# Patient Record
Sex: Female | Born: 1945
Health system: Southern US, Community
[De-identification: ages and names within clinical notes are randomized; demographics above are authoritative.]

## PROBLEM LIST (undated history)

## (undated) DIAGNOSIS — C50919 Malignant neoplasm of unspecified site of unspecified female breast: Secondary | ICD-10-CM

## (undated) DIAGNOSIS — C801 Malignant (primary) neoplasm, unspecified: Secondary | ICD-10-CM

## (undated) DIAGNOSIS — G4730 Sleep apnea, unspecified: Secondary | ICD-10-CM

## (undated) DIAGNOSIS — Z923 Personal history of irradiation: Secondary | ICD-10-CM

## (undated) DIAGNOSIS — Z803 Family history of malignant neoplasm of breast: Secondary | ICD-10-CM

## (undated) DIAGNOSIS — I10 Essential (primary) hypertension: Secondary | ICD-10-CM

## (undated) DIAGNOSIS — E119 Type 2 diabetes mellitus without complications: Secondary | ICD-10-CM

## (undated) HISTORY — DX: Type 2 diabetes mellitus without complications: E11.9

## (undated) HISTORY — PX: ABDOMINAL HYSTERECTOMY: SHX81

## (undated) HISTORY — DX: Essential (primary) hypertension: I10

## (undated) HISTORY — PX: BREAST LUMPECTOMY: SHX2

## (undated) HISTORY — DX: Family history of malignant neoplasm of breast: Z80.3

---

## 1998-10-02 ENCOUNTER — Encounter: Payer: Self-pay | Admitting: Family Medicine

## 1998-10-02 ENCOUNTER — Ambulatory Visit (HOSPITAL_COMMUNITY): Admission: RE | Admit: 1998-10-02 | Discharge: 1998-10-02 | Payer: Self-pay | Admitting: Family Medicine

## 1999-10-05 ENCOUNTER — Encounter: Payer: Self-pay | Admitting: Family Medicine

## 1999-10-05 ENCOUNTER — Ambulatory Visit (HOSPITAL_COMMUNITY): Admission: RE | Admit: 1999-10-05 | Discharge: 1999-10-05 | Payer: Self-pay | Admitting: Family Medicine

## 2000-10-25 ENCOUNTER — Encounter: Payer: Self-pay | Admitting: Family Medicine

## 2000-10-25 ENCOUNTER — Ambulatory Visit (HOSPITAL_COMMUNITY): Admission: RE | Admit: 2000-10-25 | Discharge: 2000-10-25 | Payer: Self-pay | Admitting: *Deleted

## 2001-10-09 ENCOUNTER — Encounter: Payer: Self-pay | Admitting: Family Medicine

## 2001-10-09 ENCOUNTER — Encounter: Admission: RE | Admit: 2001-10-09 | Discharge: 2001-10-09 | Payer: Self-pay | Admitting: Family Medicine

## 2001-10-29 ENCOUNTER — Encounter: Payer: Self-pay | Admitting: Family Medicine

## 2001-10-29 ENCOUNTER — Ambulatory Visit (HOSPITAL_COMMUNITY): Admission: RE | Admit: 2001-10-29 | Discharge: 2001-10-29 | Payer: Self-pay | Admitting: Family Medicine

## 2003-10-15 ENCOUNTER — Ambulatory Visit (HOSPITAL_COMMUNITY): Admission: RE | Admit: 2003-10-15 | Discharge: 2003-10-15 | Payer: Self-pay | Admitting: Family Medicine

## 2004-04-22 ENCOUNTER — Encounter: Admission: RE | Admit: 2004-04-22 | Discharge: 2004-04-22 | Payer: Self-pay | Admitting: Family Medicine

## 2004-11-08 ENCOUNTER — Ambulatory Visit (HOSPITAL_COMMUNITY): Admission: RE | Admit: 2004-11-08 | Discharge: 2004-11-08 | Payer: Self-pay | Admitting: *Deleted

## 2005-04-12 ENCOUNTER — Ambulatory Visit (HOSPITAL_BASED_OUTPATIENT_CLINIC_OR_DEPARTMENT_OTHER): Admission: RE | Admit: 2005-04-12 | Discharge: 2005-04-12 | Payer: Self-pay | Admitting: *Deleted

## 2005-04-17 ENCOUNTER — Ambulatory Visit: Payer: Self-pay | Admitting: Internal Medicine

## 2005-05-11 ENCOUNTER — Ambulatory Visit (HOSPITAL_COMMUNITY): Admission: RE | Admit: 2005-05-11 | Discharge: 2005-05-11 | Payer: Self-pay | Admitting: *Deleted

## 2005-12-16 ENCOUNTER — Ambulatory Visit (HOSPITAL_COMMUNITY): Admission: RE | Admit: 2005-12-16 | Discharge: 2005-12-16 | Payer: Self-pay | Admitting: *Deleted

## 2006-01-09 ENCOUNTER — Encounter: Admission: RE | Admit: 2006-01-09 | Discharge: 2006-01-09 | Payer: Self-pay | Admitting: *Deleted

## 2007-01-25 ENCOUNTER — Ambulatory Visit (HOSPITAL_COMMUNITY): Admission: RE | Admit: 2007-01-25 | Discharge: 2007-01-25 | Payer: Self-pay | Admitting: Internal Medicine

## 2008-03-17 ENCOUNTER — Ambulatory Visit (HOSPITAL_COMMUNITY): Admission: RE | Admit: 2008-03-17 | Discharge: 2008-03-17 | Payer: Self-pay | Admitting: Internal Medicine

## 2009-03-18 ENCOUNTER — Ambulatory Visit (HOSPITAL_COMMUNITY): Admission: RE | Admit: 2009-03-18 | Discharge: 2009-03-18 | Payer: Self-pay | Admitting: Internal Medicine

## 2010-03-19 ENCOUNTER — Ambulatory Visit (HOSPITAL_COMMUNITY): Admission: RE | Admit: 2010-03-19 | Discharge: 2010-03-19 | Payer: Self-pay | Admitting: Internal Medicine

## 2010-12-26 ENCOUNTER — Encounter: Payer: Self-pay | Admitting: Internal Medicine

## 2011-03-15 ENCOUNTER — Other Ambulatory Visit: Payer: Self-pay | Admitting: Family Medicine

## 2011-03-15 DIAGNOSIS — Z1231 Encounter for screening mammogram for malignant neoplasm of breast: Secondary | ICD-10-CM

## 2011-03-15 DIAGNOSIS — Z0000 Encounter for general adult medical examination without abnormal findings: Secondary | ICD-10-CM

## 2011-03-21 ENCOUNTER — Ambulatory Visit (HOSPITAL_COMMUNITY)
Admission: RE | Admit: 2011-03-21 | Discharge: 2011-03-21 | Disposition: A | Discharge status: Home / Self Care | Payer: BLUE CROSS/BLUE SHIELD | Source: Ambulatory Visit | Attending: Family Medicine | Admitting: Family Medicine

## 2011-03-21 DIAGNOSIS — Z1231 Encounter for screening mammogram for malignant neoplasm of breast: Secondary | ICD-10-CM | POA: Insufficient documentation

## 2011-03-22 ENCOUNTER — Other Ambulatory Visit: Payer: Self-pay | Admitting: Family Medicine

## 2011-03-22 DIAGNOSIS — R928 Other abnormal and inconclusive findings on diagnostic imaging of breast: Secondary | ICD-10-CM

## 2011-03-25 ENCOUNTER — Ambulatory Visit
Admission: RE | Admit: 2011-03-25 | Discharge: 2011-03-25 | Disposition: A | Discharge status: Home / Self Care | Payer: BLUE CROSS/BLUE SHIELD | Source: Ambulatory Visit | Attending: Family Medicine | Admitting: Family Medicine

## 2011-03-25 DIAGNOSIS — R928 Other abnormal and inconclusive findings on diagnostic imaging of breast: Secondary | ICD-10-CM

## 2011-04-05 ENCOUNTER — Other Ambulatory Visit: Payer: Self-pay | Admitting: Family Medicine

## 2011-04-05 DIAGNOSIS — Z803 Family history of malignant neoplasm of breast: Secondary | ICD-10-CM

## 2011-04-22 NOTE — Procedures (Signed)
[name], Susan Miles                 ACCOUNT NO.:  [id number]   MEDICAL RECORD NO.:  [id number]          PATIENT TYPE:  OUT   LOCATION:  SLEEP CENTER                 FACILITY:  [facility]   PHYSICIAN:  Clinton D. Maple Hudson, M.D. DATE OF BIRTH:  19-Oct-1946   DATE OF STUDY:  04/12/2005                              NOCTURNAL POLYSOMNOGRAM   REFERRING PHYSICIAN:  Dr. Coralee Rud   STUDY DATE:  Apr 12, 2005   INDICATION FOR STUDY:  Hypersomnia with sleep apnea.  Epworth Sleepiness  Score 9/24, BMI 41, weight 242 pounds.   SLEEP ARCHITECTURE:  Total sleep time 415 minutes with sleep efficiency 93%.  Stage I 4%, stage II 66%, stages III and IV 2% and REM was 29% of total  sleep time.  Sleep latency 9 minutes, REM latency 81 minutes, awake after  sleep onset 22 minutes, arousal index 20.  No sleep medication was taken.   RESPIRATORY DATA:  Respiratory disturbance index (RDI, AHI) 26.6 obstructive  events per hour indicating moderate obstructive sleep apnea/hypopnea  syndrome.  There were 3 obstructive apneas and 181 hypopneas.  Events were  not positional.  REM RDI 69.  The technician recorded that she was  borderline and qualifying for progression to CPAP titration by split  protocol on this study night.  Because events appeared to resolve with  position change initially he did not attempt CPAP titration.   OXYGEN DATA:  Moderately loud snoring with oxygen desaturation to a nadir of  84%.  Mean oxygen saturation through the study was 94% on room air.   CARDIAC DATA:  Normal sinus rhythm.   MOVEMENT/PARASOMNIA:  Occasional leg jerks with insignificant effect on  sleep.   IMPRESSION/RECOMMENDATION:  1.  Moderate obstructive sleep apnea/hypopnea syndrome, respiratory      disturbance index 26.6 per hour with moderate snoring and oxygen      desaturation to 84% on room air.  2.  Most events were hypopneas, not positional, but clearly most pronounced      in rapid eye movement (rapid eye movement  respiratory disturbance index      69).  Rapid eye movement suppression therapy with a tricyclic      antidepressant has been used occasionally in this situation although      standard therapies including continuous positive airway pressure were      usually considered preferable.  Consider return for continuous positive      airway pressure titration or evaluate for alternative therapies as      appropriate.  3.  This study was done as a nighttime study and her sleep pattern was      unremarkable without sleep medication.  She indicates her usual work      schedule is night shift and that she sleeps off and on during the day.      Her sleep      schedule and habits may contribute to her sleep complaint.  If more      appropriate, she can return for continuous positive airway pressure      titration as a daytime study if requested.      CDY/MEDQ  D:  04/17/2005 09:07:15  T:  04/17/2005 10:04:05  Job:  [id number]

## 2011-05-05 ENCOUNTER — Other Ambulatory Visit: Payer: BLUE CROSS/BLUE SHIELD

## 2011-05-24 ENCOUNTER — Ambulatory Visit
Admission: RE | Admit: 2011-05-24 | Discharge: 2011-05-24 | Disposition: A | Discharge status: Home / Self Care | Payer: BLUE CROSS/BLUE SHIELD | Source: Ambulatory Visit | Attending: Family Medicine | Admitting: Family Medicine

## 2011-05-24 DIAGNOSIS — Z803 Family history of malignant neoplasm of breast: Secondary | ICD-10-CM

## 2011-05-24 MED ORDER — GADOBENATE DIMEGLUMINE 529 MG/ML IV SOLN
19.0000 mL | Freq: Once | INTRAVENOUS | Status: AC | PRN
  Administered 2011-05-24: 19 mL via INTRAVENOUS

## 2012-01-24 ENCOUNTER — Ambulatory Visit: Payer: Medicare Other

## 2012-01-24 ENCOUNTER — Ambulatory Visit (INDEPENDENT_AMBULATORY_CARE_PROVIDER_SITE_OTHER): Payer: Medicare Other | Admitting: Family Medicine

## 2012-01-24 DIAGNOSIS — M545 Low back pain, unspecified: Secondary | ICD-10-CM

## 2012-01-24 DIAGNOSIS — M25559 Pain in unspecified hip: Secondary | ICD-10-CM

## 2012-01-24 DIAGNOSIS — M5430 Sciatica, unspecified side: Secondary | ICD-10-CM

## 2012-01-24 DIAGNOSIS — M79606 Pain in leg, unspecified: Secondary | ICD-10-CM

## 2012-01-24 MED ORDER — CYCLOBENZAPRINE HCL 5 MG PO TABS: 5.0000 mg | ORAL_TABLET | Freq: Three times a day (TID) | ORAL | Status: AC | PRN

## 2012-01-24 MED ORDER — MELOXICAM 7.5 MG PO TABS: 7.5000 mg | ORAL_TABLET | Freq: Every day | ORAL | Status: DC

## 2012-01-24 NOTE — Patient Instructions (Signed)
See the back care manual for instructions.  Muscle relaxer (cyclobenzaprine) at night if needed, meloxicam during day for inflammation.  Can take tylenol over the counter as well.  Work on heat or ice to area, range of motion, gentle stretches.  Recheck in next 5-7 days if not improving.  Return to the clinic or go to the nearest emergency room if any of your symptoms worsen or new symptoms occur.   Sciatica Sciatica is a weakness and/or changes in sensation (tingling, jolts, hot and cold, numbness) along the path the sciatic nerve travels. Irritation or damage to lumbar nerve roots is often also referred to as lumbar radiculopathy.  Lumbar radiculopathy (Sciatica) is the most common form of this problem. Radiculopathy can occur in any of the nerves coming out of the spinal cord. The problems caused depend on which nerves are involved. The sciatic nerve is the large nerve supplying the branches of nerves going from the hip to the toes. It often causes a numbness or weakness in the skin and/or muscles that the sciatic nerve serves. It also may cause symptoms (problems) of pain, burning, tingling, or electric shock-like feelings in the path of this nerve. This usually comes from injury to the fibers that make up the sciatic nerve. Some of these symptoms are low back pain and/or unpleasant feelings in the following areas:  From the mid-buttock down the back of the leg to the back of the knee.   And/or the outside of the calf and top of the foot.   And/or behind the inner ankle to the sole of the foot.  CAUSES   Herniated or slipped disc. Discs are the little cushions between the bones in the back.   Pressure by the piriformis muscle in the buttock on the sciatic nerve (Piriformis Syndrome).   Misalignment of the bones in the lower back and buttocks (Sacroiliac Joint Derangement).   Narrowing of the spinal canal that puts pressure on or pinches the fibers that make up the sciatic nerve.   A  slipped vertebra that is out of line with those above or beneath it.   Abnormality of the nervous system itself so that nerve fibers do not transmit signals properly, especially to feet and calves (neuropathy).   Tumor (this is rare).  Your caregiver can usually determine the cause of your sciatica and begin the treatment most likely to help you. TREATMENT  Taking over-the-counter painkillers, physical therapy, rest, exercise, spinal manipulation, and injections of anesthetics and/or steroids may be used. Surgery, acupuncture, and Yoga can also be effective. Mind over matter techniques, mental imagery, and changing factors such as your bed, chair, desk height, posture, and activities are other treatments that may be helpful. You and your caregiver can help determine what is best for you. With proper diagnosis, the cause of most sciatica can be identified and removed. Communication and cooperation between your caregiver and you is essential. If you are not successful immediately, do not be discouraged. With time, a proper treatment can be found that will make you comfortable. HOME CARE INSTRUCTIONS   If the pain is coming from a problem in the back, applying ice to that area for 15 to 20 minutes, 3 to 4 times per day while awake, may be helpful. Put the ice in a plastic bag. Place a towel between the bag of ice and your skin.   You may exercise or perform your usual activities if these do not aggravate your pain, or as suggested by your  caregiver.   Only take over-the-counter or prescription medicines for pain, discomfort, or fever as directed by your caregiver.   If your caregiver has given you a follow-up appointment, it is very important to keep that appointment. Not keeping the appointment could result in a chronic or permanent injury, pain, and disability. If there is any problem keeping the appointment, you must call back to this facility for assistance.  SEEK IMMEDIATE MEDICAL CARE IF:   You  experience loss of control of bowel or bladder.   You have increasing weakness in the trunk, buttocks, or legs.   There is numbness in any areas from the hip down to the toes.   You have difficulty walking or keeping your balance.   You have any of the above, with fever or forceful vomiting.  Document Released: 11/15/2001 Document Revised: 08/03/2011 Document Reviewed: 07/04/2008 [facility] Patient Information 2012 [place], [place].

## 2012-01-24 NOTE — Progress Notes (Signed)
Subjective:    Patient ID: Susan Miles, female    DOB: 1946-06-03, 66 y.o.   MRN: [id number]  HPI Susan Miles is a 66 y.o. female, with hx DM type 2 (A1c 6.9 07/12/11), HTN, and hyperlipidemia with c/o   Started with pain in R leg behind R thigh and into R buttock, low aspect of R back/hip.  Does not radiate beyond knee.  NKI, just woke up with pain 2 days ago.  Feels like swelling in area. Similar symptoms in past that improved with tylenol and rest.  tylenol only as treatment this time - feels worse than in past. No bowel/bladder sx's.   Review of Systems  Constitutional: Positive for activity change. Negative for fever and chills.       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  Gastrointestinal: Negative for abdominal pain.       No bowel incontinence  Genitourinary: Negative for urgency, frequency, hematuria and difficulty urinating.       No bladder incontinence.  Musculoskeletal: Positive for myalgias, back pain, arthralgias and gait problem. Negative for joint swelling.       Sore in bottom of low back and to outside and back of hip.  Skin: Negative for color change and rash.  Neurological: Negative for weakness.       Objective:   Physical Exam  Constitutional: She is oriented to person, place, and time. She appears well-developed and well-nourished.  HENT:  Head: Normocephalic and atraumatic.  Cardiovascular: Normal rate and intact distal pulses.   Pulmonary/Chest: Effort normal and breath sounds normal.  Abdominal: Soft. There is no tenderness.  Musculoskeletal:       Right hip: Normal. She exhibits normal range of motion and no tenderness.       Lumbar back: She exhibits spasm. She exhibits normal range of motion, no tenderness, no bony tenderness and no swelling.       Back:       Right upper leg: She exhibits tenderness. She exhibits no swelling and no edema.       Legs:      No pain with internal  rotation of R hip.  Neurological: She is alert and oriented to person, place, and time. No sensory deficit. She displays no Babinski's sign on the right side. She displays no Babinski's sign on the left side.  Reflex Scores:      Patellar reflexes are 2+ on the right side and 2+ on the left side.      Achilles reflexes are 2+ on the right side and 2+ on the left side. Skin: Skin is warm and dry. No rash noted.  Psychiatric: She has a normal mood and affect. Her behavior is normal.   [facility] reading (PRIMARY) by  Dr. Neva Seat: R hip: no acute findings.  LS spine:  DJD lower lumbar - L4-5, notable in posterior elements.  Slight spondylosis      Assessment & Plan:   1. LBP (low back pain)  DG Lumbar Spine Complete  2. Leg pain, posterior  DG Hip Complete Right   Probable lumbosacral DJD/DDD with sciatica symptoms, with onset from increased work/standing at work.  "swelling" is likely spasm.  Good strength and NVI distally.  No pain with IR of hip - doubt hip source.  Trial of Flexeril 5mg  qhs prn -side effects discussed, mobic 7.5mg  qd prn (short course based on hx , and watch blood pressures), heat, and  gentle ROM.  Back care manual. oow for next 3 days - rtw  Sunday 02/26/12. If not improving in next 5-7 days, RTC.  Return to the clinic or go to the nearest emergency room if symptoms worsen or new symptoms occur.

## 2012-02-10 ENCOUNTER — Ambulatory Visit (INDEPENDENT_AMBULATORY_CARE_PROVIDER_SITE_OTHER): Payer: Medicare Other | Admitting: Family Medicine

## 2012-02-10 ENCOUNTER — Encounter: Payer: Self-pay | Admitting: Family Medicine

## 2012-02-10 DIAGNOSIS — E119 Type 2 diabetes mellitus without complications: Secondary | ICD-10-CM | POA: Insufficient documentation

## 2012-02-10 DIAGNOSIS — I10 Essential (primary) hypertension: Secondary | ICD-10-CM | POA: Insufficient documentation

## 2012-02-10 DIAGNOSIS — IMO0001 Reserved for inherently not codable concepts without codable children: Secondary | ICD-10-CM

## 2012-02-10 DIAGNOSIS — E1129 Type 2 diabetes mellitus with other diabetic kidney complication: Secondary | ICD-10-CM | POA: Insufficient documentation

## 2012-02-10 LAB — COMPREHENSIVE METABOLIC PANEL
Albumin: 4.2 g/dL (ref 3.5–5.2)
BUN: 11 mg/dL (ref 6–23)
Glucose, Bld: 116 mg/dL — ABNORMAL HIGH (ref 70–99)
Potassium: 4.5 mEq/L (ref 3.5–5.3)
Sodium: 137 mEq/L (ref 135–145)
Total Bilirubin: 0.6 mg/dL (ref 0.3–1.2)

## 2012-02-10 LAB — GLUCOSE, POCT (MANUAL RESULT ENTRY): POC Glucose: 139

## 2012-02-10 MED ORDER — SAXAGLIPTIN HCL 5 MG PO TABS: 5.0000 mg | ORAL_TABLET | Freq: Every day | ORAL | Status: DC

## 2012-02-10 MED ORDER — CETIRIZINE HCL 10 MG PO TABS: 10.0000 mg | ORAL_TABLET | Freq: Every day | ORAL | Status: DC

## 2012-02-10 MED ORDER — METOPROLOL SUCCINATE ER 200 MG PO TB24: 200.0000 mg | ORAL_TABLET | Freq: Every day | ORAL | Status: DC

## 2012-02-10 MED ORDER — VALSARTAN-HYDROCHLOROTHIAZIDE 320-25 MG PO TABS: 1.0000 | ORAL_TABLET | Freq: Every day | ORAL | Status: DC

## 2012-02-10 MED ORDER — METFORMIN HCL 1000 MG PO TABS: 1000.0000 mg | ORAL_TABLET | Freq: Two times a day (BID) | ORAL | Status: DC

## 2012-02-10 MED ORDER — CLONIDINE HCL 0.2 MG PO TABS: 0.2000 mg | ORAL_TABLET | Freq: Two times a day (BID) | ORAL | Status: DC

## 2012-02-10 MED ORDER — PRAVASTATIN SODIUM 40 MG PO TABS: 40.0000 mg | ORAL_TABLET | Freq: Every day | ORAL | Status: DC

## 2012-02-10 NOTE — Progress Notes (Signed)
  Subjective:    Patient ID: Susan Miles, female    DOB: 1946/03/17, 66 y.o.   MRN: [id number]  HPI Susan Miles is a 66 y.o. female  Here for DM follow up.  DM2 - home cbg's 120 -140/ 150.  No symptomatic low.  No new side effects. Wt 207-210 since august.  Last HGB A1c 6.14 July 2011 Htn:  No new side effects with meds.  140/89 at home at times.  Review of Systems  Constitutional: Negative for fever, chills, activity change and fatigue.  Respiratory: Negative for shortness of breath and wheezing.   Cardiovascular: Negative for chest pain, palpitations and leg swelling.  Gastrointestinal: Negative for abdominal pain.  Neurological: Negative for dizziness, syncope, weakness, light-headedness, numbness and headaches.       Objective:   Physical Exam  Constitutional: She is oriented to person, place, and time. She appears well-developed and well-nourished.  HENT:  Head: Normocephalic and atraumatic.  Mouth/Throat: Oropharynx is clear and moist.  Eyes: Conjunctivae and EOM are normal. Pupils are equal, round, and reactive to light.  Neck: Normal range of motion. No thyromegaly present.  Cardiovascular: Normal rate, regular rhythm, normal heart sounds and intact distal pulses.   No murmur heard. Pulmonary/Chest: Effort normal. She has no wheezes. She has no rales.  Abdominal: Soft. Normal appearance. She exhibits no abdominal bruit and no pulsatile midline mass. There is no tenderness.  Neurological: She is alert and oriented to person, place, and time.       Microfilament testing WNL bilat feet  Skin: Skin is warm and dry.  Psychiatric: She has a normal mood and affect. Her behavior is normal.   Fasting today. Results for orders placed in visit on 02/10/12  POCT GLYCOSYLATED HEMOGLOBIN (HGB A1C)      Component Value Range   Hemoglobin A1C 7.9    GLUCOSE, POCT (MANUAL RESULT ENTRY)      Component Value Range   POC Glucose 139         Assessment & Plan:  Susan Miles  is a 66 y.o. female 1. HTN (hypertension)  Comprehensive metabolic panel  2. DM2 (diabetes mellitus, type 2)  POCT glycosylated hemoglobin (Hb A1C), POCT glucose (manual entry)   Uncontrolled diabetes.  stressed importance of diet and exercise to achieve wt loss, as weight up today.  Continue metformin 1 gram BID, increase onglyza to 5mg  qd.  Check home cbg's,  Hypoglycemic precautions reviewed.  BP borderline, but no new meds for now  Recheck in 3 months.

## 2012-02-10 NOTE — Patient Instructions (Signed)
Work on diet and weight loss as diabetes less controlled.  Increasing onglyza to 5mg  1 per day, other meds same.  Recheck 3 months.

## 2012-02-19 ENCOUNTER — Other Ambulatory Visit: Payer: Self-pay | Admitting: Family Medicine

## 2012-03-12 ENCOUNTER — Other Ambulatory Visit: Payer: Self-pay | Admitting: Family Medicine

## 2012-03-12 DIAGNOSIS — Z1231 Encounter for screening mammogram for malignant neoplasm of breast: Secondary | ICD-10-CM

## 2012-04-05 ENCOUNTER — Ambulatory Visit (HOSPITAL_COMMUNITY)
Admission: RE | Admit: 2012-04-05 | Discharge: 2012-04-05 | Disposition: A | Discharge status: Home / Self Care | Payer: Medicare Other | Source: Ambulatory Visit | Attending: Family Medicine | Admitting: Family Medicine

## 2012-04-05 DIAGNOSIS — Z1231 Encounter for screening mammogram for malignant neoplasm of breast: Secondary | ICD-10-CM | POA: Insufficient documentation

## 2012-05-18 ENCOUNTER — Ambulatory Visit (INDEPENDENT_AMBULATORY_CARE_PROVIDER_SITE_OTHER): Payer: Medicare Other | Admitting: Family Medicine

## 2012-05-18 ENCOUNTER — Encounter: Payer: Self-pay | Admitting: Family Medicine

## 2012-05-18 VITALS — BP 127/83 | HR 76 | Temp 97.8°F | Resp 16 | Ht 63.0 in | Wt 206.0 lb

## 2012-05-18 DIAGNOSIS — I10 Essential (primary) hypertension: Secondary | ICD-10-CM

## 2012-05-18 DIAGNOSIS — IMO0001 Reserved for inherently not codable concepts without codable children: Secondary | ICD-10-CM

## 2012-05-18 DIAGNOSIS — E785 Hyperlipidemia, unspecified: Secondary | ICD-10-CM

## 2012-05-18 LAB — POCT GLYCOSYLATED HEMOGLOBIN (HGB A1C): Hemoglobin A1C: 7.5

## 2012-05-18 MED ORDER — CETIRIZINE HCL 10 MG PO TABS: 10.0000 mg | ORAL_TABLET | Freq: Every day | ORAL | Status: DC

## 2012-05-18 MED ORDER — PRAVASTATIN SODIUM 40 MG PO TABS: 40.0000 mg | ORAL_TABLET | Freq: Every day | ORAL | Status: DC

## 2012-05-18 MED ORDER — CLONIDINE HCL 0.2 MG PO TABS: 0.2000 mg | ORAL_TABLET | Freq: Two times a day (BID) | ORAL | Status: DC

## 2012-05-18 MED ORDER — GLIPIZIDE 10 MG PO TABS: 10.0000 mg | ORAL_TABLET | Freq: Two times a day (BID) | ORAL | Status: DC

## 2012-05-18 MED ORDER — SAXAGLIPTIN HCL 5 MG PO TABS: 5.0000 mg | ORAL_TABLET | Freq: Every day | ORAL | Status: DC

## 2012-05-18 MED ORDER — MELOXICAM 7.5 MG PO TABS: 7.5000 mg | ORAL_TABLET | Freq: Every day | ORAL | Status: DC

## 2012-05-18 MED ORDER — METOPROLOL SUCCINATE ER 200 MG PO TB24: 200.0000 mg | ORAL_TABLET | Freq: Every day | ORAL | Status: DC

## 2012-05-18 MED ORDER — VALSARTAN-HYDROCHLOROTHIAZIDE 320-25 MG PO TABS: 1.0000 | ORAL_TABLET | Freq: Every day | ORAL | Status: DC

## 2012-05-18 MED ORDER — METFORMIN HCL 1000 MG PO TABS: 1000.0000 mg | ORAL_TABLET | Freq: Two times a day (BID) | ORAL | Status: DC

## 2012-05-18 NOTE — Progress Notes (Signed)
  Subjective:    Patient ID: Susan Miles, female    DOB: 08-May-1946, 67 y.o.   MRN: [id number]  HPI Susan Miles is a 66 y.o. female  HTN - highest 161/90.  Usually 120-140/80's.   DM2 - Aic 7.9 in March.  stressed importance of diet and exercise to achieve wt loss. Continued metformin 1 gram BID, increased onglyza to 5mg  qd. Weight up approximately 1 kg. Just retired. Plans on increasing exercise. Home blood sugars - 135-140.  Occasionally 160.  Had some junk food with retirement party. Now cooking at home with fresh vegetables. No lows. Dentist visit next month.  Will be scheduling eye appt.   Hyperlipidemia - no new myalgias, or side effects of meds.  Last LDL 103 in 8/12. Fasting today.   Review of Systems  Constitutional: Negative for fatigue and unexpected weight change.  Respiratory: Negative for chest tightness and shortness of breath.   Cardiovascular: Negative for chest pain, palpitations and leg swelling.  Gastrointestinal: Negative for abdominal pain and blood in stool.  Neurological: Negative for dizziness, syncope, light-headedness and headaches.       Objective:   Physical Exam  Constitutional: She is oriented to person, place, and time. She appears well-developed and well-nourished.  HENT:  Head: Normocephalic and atraumatic.  Mouth/Throat: Oropharynx is clear and moist.  Eyes: Conjunctivae and EOM are normal. Pupils are equal, round, and reactive to light.  Neck: Normal range of motion. No thyromegaly present.  Cardiovascular: Normal rate, regular rhythm, normal heart sounds and intact distal pulses.   No murmur heard. Pulmonary/Chest: Effort normal. She has no wheezes. She has no rales.  Abdominal: Soft. Normal appearance. She exhibits no abdominal bruit and no pulsatile midline mass. There is no tenderness.  Neurological: She is alert and oriented to person, place, and time.       Microfilament testing WNL bilat feet  Skin: Skin is warm and dry.  Psychiatric:  She has a normal mood and affect. Her behavior is normal.   Results for orders placed in visit on 05/18/12  GLUCOSE, POCT (MANUAL RESULT ENTRY)      Component Value Range   POC Glucose 115 (*) 70 - 99 mg/dl  POCT GLYCOSYLATED HEMOGLOBIN (HGB A1C)      Component Value Range   Hemoglobin A1C 7.5         Assessment & Plan:  .[name] is a 66 y.o. female 1. Essential hypertension, benign  Comprehensive metabolic panel  2. Type II or unspecified type diabetes mellitus without mention of complication, uncontrolled  POCT glucose (manual entry), POCT glycosylated hemoglobin (Hb A1C)  3. Other and unspecified hyperlipidemia  Lipid panel   DM2 - uncontrolled but improving.  Continue to work on diet and exercise, same doses for now.  Recheck in 3 months.   HTN - overall controlled.  Continue same regimen.    Episodic arthralgia - has mobic prn - risks discussed.    Lipids - discussed weight loss, and counseled on diet changes, fiber, and increase in water. Check CMP, lipids.    Recheck in 3 months.   Refills given x 6 months.

## 2012-05-18 NOTE — Patient Instructions (Addendum)
Continue to work on diet and walking for exercise, goal A1c less than 7. Recheck in 3 months, sooner if any new symptoms.  Your should receive a call or letter about your lab results within the next week to 10 days.  Return to the clinic or go to the nearest emergency room if any of your symptoms worsen or new symptoms occur.

## 2012-05-19 LAB — LIPID PANEL
HDL: 38 mg/dL — ABNORMAL LOW (ref >39)
LDL Cholesterol: 96 mg/dL (ref 0–99)
Total CHOL/HDL Ratio: 4 Ratio
VLDL: 17 mg/dL (ref 0–40)

## 2012-05-19 LAB — COMPREHENSIVE METABOLIC PANEL
ALT: 17 U/L (ref 0–35)
AST: 17 U/L (ref 0–37)
Alkaline Phosphatase: 45 U/L (ref 39–117)
BUN: 15 mg/dL (ref 6–23)
Creat: 0.85 mg/dL (ref 0.50–1.10)
Total Bilirubin: 0.6 mg/dL (ref 0.3–1.2)

## 2012-05-24 DIAGNOSIS — IMO0002 Reserved for concepts with insufficient information to code with codable children: Secondary | ICD-10-CM | POA: Insufficient documentation

## 2012-09-17 ENCOUNTER — Ambulatory Visit (INDEPENDENT_AMBULATORY_CARE_PROVIDER_SITE_OTHER): Payer: Medicare Other | Admitting: Family Medicine

## 2012-09-17 ENCOUNTER — Encounter: Payer: Self-pay | Admitting: Family Medicine

## 2012-09-17 VITALS — BP 168/92 | HR 77 | Temp 97.9°F | Resp 16 | Ht 63.0 in | Wt 211.8 lb

## 2012-09-17 DIAGNOSIS — E785 Hyperlipidemia, unspecified: Secondary | ICD-10-CM

## 2012-09-17 DIAGNOSIS — M549 Dorsalgia, unspecified: Secondary | ICD-10-CM

## 2012-09-17 DIAGNOSIS — L309 Dermatitis, unspecified: Secondary | ICD-10-CM

## 2012-09-17 DIAGNOSIS — I10 Essential (primary) hypertension: Secondary | ICD-10-CM

## 2012-09-17 DIAGNOSIS — L259 Unspecified contact dermatitis, unspecified cause: Secondary | ICD-10-CM

## 2012-09-17 DIAGNOSIS — J309 Allergic rhinitis, unspecified: Secondary | ICD-10-CM

## 2012-09-17 DIAGNOSIS — E119 Type 2 diabetes mellitus without complications: Secondary | ICD-10-CM

## 2012-09-17 DIAGNOSIS — Z23 Encounter for immunization: Secondary | ICD-10-CM

## 2012-09-17 DIAGNOSIS — IMO0001 Reserved for inherently not codable concepts without codable children: Secondary | ICD-10-CM

## 2012-09-17 LAB — POCT GLYCOSYLATED HEMOGLOBIN (HGB A1C): Hemoglobin A1C: 7.2

## 2012-09-17 LAB — GLUCOSE, POCT (MANUAL RESULT ENTRY): POC Glucose: 115 mg/dl — AB (ref 70–99)

## 2012-09-17 MED ORDER — SAXAGLIPTIN HCL 5 MG PO TABS: 5.0000 mg | ORAL_TABLET | Freq: Every day | ORAL | Status: DC

## 2012-09-17 MED ORDER — MELOXICAM 7.5 MG PO TABS: 7.5000 mg | ORAL_TABLET | Freq: Every day | ORAL | Status: AC

## 2012-09-17 MED ORDER — GLIPIZIDE 10 MG PO TABS: 10.0000 mg | ORAL_TABLET | Freq: Two times a day (BID) | ORAL | Status: DC

## 2012-09-17 MED ORDER — CLONIDINE HCL 0.1 MG PO TABS: 0.1000 mg | ORAL_TABLET | Freq: Every morning | ORAL | Status: DC

## 2012-09-17 MED ORDER — CETIRIZINE HCL 10 MG PO TABS: 10.0000 mg | ORAL_TABLET | Freq: Every day | ORAL | Status: DC

## 2012-09-17 MED ORDER — METOPROLOL SUCCINATE ER 200 MG PO TB24: 200.0000 mg | ORAL_TABLET | Freq: Every day | ORAL | Status: DC

## 2012-09-17 MED ORDER — CLONIDINE HCL 0.2 MG PO TABS: 0.2000 mg | ORAL_TABLET | Freq: Two times a day (BID) | ORAL | Status: DC

## 2012-09-17 MED ORDER — VALSARTAN-HYDROCHLOROTHIAZIDE 320-25 MG PO TABS: 1.0000 | ORAL_TABLET | Freq: Every day | ORAL | Status: DC

## 2012-09-17 MED ORDER — TRIAMCINOLONE ACETONIDE 0.1 % EX CREA: TOPICAL_CREAM | Freq: Two times a day (BID) | CUTANEOUS | Status: DC

## 2012-09-17 MED ORDER — METFORMIN HCL 1000 MG PO TABS: 1000.0000 mg | ORAL_TABLET | Freq: Two times a day (BID) | ORAL | Status: DC

## 2012-09-17 MED ORDER — PRAVASTATIN SODIUM 40 MG PO TABS: 40.0000 mg | ORAL_TABLET | Freq: Every day | ORAL | Status: DC

## 2012-09-17 NOTE — Progress Notes (Signed)
Subjective:    Patient ID: Susan Miles, female    DOB: [birth year], 66 y.o.   MRN: [id number]  HPI Susan Miles is a 66 y.o. female HTN - no recent outside BP's. Machine broke. No chest pain or other new symptoms.  No weakness, no slurred speech. No missed doses of catapres, diovan hct, or toprol. Prior controlled. Only rare meloxicam for arthritis - last taken for arthritis or low back pain.   DM2 - Aic 7.5 (down from 7.9) at OV in June.  Continued metformin 1 gram BID, glipizide 10mg  BID, onglyza 5mg  qd with emphasis on diet and weight loss.  Not exercising recently. No recent change in diet.  Occasional fried food.  Usually bakes.  Plans on joining the [facility].   Home blood sugars: 148, 130, 120, 150. Dentist visit in July, optho eval in August.  No concerns.   Hyperlipidemia -labs as below.  Continued pravachol 40mg  qd. No new side effects or new or worsening myalgias.   Foot itching - under foot , on top and heel.  No tingling.  Comes and goes. Past month.  Tx: benadryl cream.   Results for orders placed in visit on 05/18/12  GLUCOSE, POCT (MANUAL RESULT ENTRY)      Component Value Range   POC Glucose 115 (*) 70 - 99 mg/dl  POCT GLYCOSYLATED HEMOGLOBIN (HGB A1C)      Component Value Range   Hemoglobin A1C 7.5    COMPREHENSIVE METABOLIC PANEL      Component Value Range   Sodium 140  135 - 145 mEq/L   Potassium 3.7  3.5 - 5.3 mEq/L   Chloride 103  96 - 112 mEq/L   CO2 29  19 - 32 mEq/L   Glucose, Bld 106 (*) 70 - 99 mg/dL   BUN 15  6 - 23 mg/dL   Creat 4.09  8.11 - 9.14 mg/dL   Total Bilirubin 0.6  0.3 - 1.2 mg/dL   Alkaline Phosphatase 45  39 - 117 U/L   AST 17  0 - 37 U/L   ALT 17  0 - 35 U/L   Total Protein 6.8  6.0 - 8.3 g/dL   Albumin 3.9  3.5 - 5.2 g/dL   Calcium 78.2  8.4 - 95.6 mg/dL  LIPID PANEL      Component Value Range   Cholesterol 151  0 - 200 mg/dL   Triglycerides 87  <213 mg/dL   HDL 38 (*) >08 mg/dL   Total CHOL/HDL Ratio 4.0     VLDL 17  0 - 40 mg/dL   LDL Cholesterol 96  0 - 99 mg/dL     Review of Systems  Constitutional: Negative for fatigue and unexpected weight change.  Respiratory: Negative for chest tightness and shortness of breath.   Cardiovascular: Negative for chest pain, palpitations and leg swelling.  Gastrointestinal: Negative for abdominal pain and blood in stool.  Skin: Negative for rash.       Itching of feet. Irritated r foot from scratching it.   Neurological: Negative for dizziness, syncope, light-headedness and headaches.       Objective:   Physical Exam  Constitutional: She is oriented to person, place, and time. She appears well-developed and well-nourished.  HENT:  Head: Normocephalic and atraumatic.  Mouth/Throat: Oropharynx is clear and moist.  Eyes: Conjunctivae normal and EOM are normal. Pupils are equal, round, and reactive to light.  Neck: Normal range of motion. No thyromegaly present.  Cardiovascular: Normal rate,  regular rhythm, normal heart sounds and intact distal pulses.   No murmur heard. Pulmonary/Chest: Effort normal. She has no wheezes. She has no rales.  Abdominal: Soft. Normal appearance. She exhibits no abdominal bruit and no pulsatile midline mass. There is no tenderness.  Neurological: She is alert and oriented to person, place, and time.       Microfilament testing WNL bilat feet  Skin: Skin is warm and dry.     Psychiatric: She has a normal mood and affect. Her behavior is normal.     Results for orders placed in visit on 09/17/12  GLUCOSE, POCT (MANUAL RESULT ENTRY)      Component Value Range   POC Glucose 115 (*) 70 - 99 mg/dl  POCT GLYCOSYLATED HEMOGLOBIN (HGB A1C)      Component Value Range   Hemoglobin A1C 7.2          Assessment & Plan:  Susan Miles is a 66 y.o. female 1. HTN (hypertension)  cloNIDine (CATAPRES) 0.2 MG tablet, metoprolol (TOPROL-XL) 200 MG 24 hr tablet, valsartan-hydrochlorothiazide (DIOVAN-HCT) 320-25 MG per tablet, cloNIDine (CATAPRES) 0.1 MG  tablet  2. Hyperlipidemia    3. DM2 (diabetes mellitus, type 2)  POCT glucose (manual entry), POCT glycosylated hemoglobin (Hb A1C), glipiZIDE (GLUCOTROL) 10 MG tablet, metFORMIN (GLUCOPHAGE) 1000 MG tablet, pravastatin (PRAVACHOL) 40 MG tablet, saxagliptin HCl (ONGLYZA) 5 MG TABS tablet  4. Eczema  triamcinolone cream (KENALOG) 0.1 %  5. Need for prophylactic vaccination and inoculation against influenza  Flu vaccine greater than or equal to 3yo preservative free IM  6. Allergic rhinitis  cetirizine (ZYRTEC) 10 MG tablet  7. Pain in back  meloxicam (MOBIC) 7.5 MG tablet     HTN - uncontrolle. Add in another 0.1mg  catapres in am.  Orthostatic precuations reviewed.  Other meds refilled - cautioned about frequent use of Mobic, but as not taken recently, unlikely contributor to elevation of BP. Concern of cost of Toprol, will consider change to bid metoprolol, but only one change as above for now.   DM2 - borderline controlled - improved form last ov.  Continue same meds.  Discussed portion control and exercise again - walking.   Hyperlipidemia - controlled.  Recheck lipids in 3-6 months.   Flu shot given.  Episodic back pain - mobic available. Rare use encouraged.   Itching feet - likely eczema - see below.   Patient Instructions  You can try the steroid cream to the areas on your feet up to twice per and eucerin lotion to dry areas twice per day.  Recheck in next 4 weeks if this is not helping.  Keep a record of your blood pressures outside of the office and bring them to the next office visit. Continue same diabetes meds, but work on diet and weight loss as discussed. Recheck levels in 3 months.  You can have fasting blood draw the morning of that office visit.   Return to the clinic or go to the nearest emergency room if any of your symptoms worsen or new symptoms occur.

## 2012-09-17 NOTE — Patient Instructions (Addendum)
You can try the steroid cream to the areas on your feet up to twice per and eucerin lotion to dry areas twice per day.  Recheck in next 4 weeks if this is not helping.  Keep a record of your blood pressures outside of the office and bring them to the next office visit. Will add additional 0.1mg  catapres to morning meds. If any lightheadedness or dizziness - stop this additional medicine and follow up.  Continue same diabetes meds, but work on diet and weight loss as discussed. Recheck levels in 3 months.  You can have fasting blood draw the morning of that office visit.   Return to the clinic or go to the nearest emergency room if any of your symptoms worsen or new symptoms occur.

## 2012-09-23 ENCOUNTER — Other Ambulatory Visit: Payer: Self-pay | Admitting: Family Medicine

## 2012-09-29 ENCOUNTER — Other Ambulatory Visit: Payer: Self-pay | Admitting: Family Medicine

## 2012-11-22 ENCOUNTER — Other Ambulatory Visit: Payer: Self-pay | Admitting: Family Medicine

## 2012-11-22 NOTE — Telephone Encounter (Signed)
Needs office visit before runs out 

## 2012-12-26 ENCOUNTER — Other Ambulatory Visit: Payer: Self-pay | Admitting: Physician Assistant

## 2013-01-01 ENCOUNTER — Ambulatory Visit (INDEPENDENT_AMBULATORY_CARE_PROVIDER_SITE_OTHER): Payer: BC Managed Care – HMO | Admitting: Family Medicine

## 2013-01-01 VITALS — BP 142/80 | HR 75 | Temp 97.7°F | Resp 17 | Ht 64.0 in | Wt 210.0 lb

## 2013-01-01 DIAGNOSIS — E785 Hyperlipidemia, unspecified: Secondary | ICD-10-CM

## 2013-01-01 DIAGNOSIS — I10 Essential (primary) hypertension: Secondary | ICD-10-CM

## 2013-01-01 DIAGNOSIS — J309 Allergic rhinitis, unspecified: Secondary | ICD-10-CM

## 2013-01-01 DIAGNOSIS — E119 Type 2 diabetes mellitus without complications: Secondary | ICD-10-CM

## 2013-01-01 MED ORDER — CLONIDINE HCL 0.2 MG PO TABS: 0.2000 mg | ORAL_TABLET | Freq: Two times a day (BID) | ORAL | Status: DC

## 2013-01-01 MED ORDER — METFORMIN HCL 1000 MG PO TABS: 1000.0000 mg | ORAL_TABLET | Freq: Two times a day (BID) | ORAL | Status: DC

## 2013-01-01 MED ORDER — GLUCOSE BLOOD VI STRP: ORAL_STRIP | Status: DC

## 2013-01-01 MED ORDER — GLIPIZIDE 10 MG PO TABS: 10.0000 mg | ORAL_TABLET | Freq: Two times a day (BID) | ORAL | Status: DC

## 2013-01-01 MED ORDER — VALSARTAN-HYDROCHLOROTHIAZIDE 320-25 MG PO TABS: 1.0000 | ORAL_TABLET | Freq: Every day | ORAL | Status: DC

## 2013-01-01 MED ORDER — CETIRIZINE HCL 10 MG PO TABS: 10.0000 mg | ORAL_TABLET | Freq: Every day | ORAL | Status: DC

## 2013-01-01 MED ORDER — PRAVASTATIN SODIUM 40 MG PO TABS: 40.0000 mg | ORAL_TABLET | Freq: Every day | ORAL | Status: DC

## 2013-01-01 MED ORDER — SAXAGLIPTIN HCL 5 MG PO TABS: 5.0000 mg | ORAL_TABLET | Freq: Every day | ORAL | Status: DC

## 2013-01-01 MED ORDER — METOPROLOL SUCCINATE ER 200 MG PO TB24: 200.0000 mg | ORAL_TABLET | Freq: Every day | ORAL | Status: DC

## 2013-01-01 NOTE — Progress Notes (Signed)
Subjective:    Patient ID: Susan Miles, female    DOB: 11-13-1946, 67 y.o.   MRN: [id number]  HPI Susan Miles is a 67 y.o. female Here for follow up and needs refills of all meds. Did not bring meds today.  DM2- home blood sugars - 119-181. No recent diet changes, checking blood sugar twice per week. Walking at times for exercise - 3 times per week initially, joined gym.  hurts ankles at times.  Last Aic 7.2 09/17/12.  HTN - see last ov - had prescribed additional 0.1mg  catapres in am - but did not get this from pharmacy - only on catapres 0.2mg  BID.  Home BP's 116/83.  to 154/90. 130'2 - 150's. Not taking mobic anymore.   Hyperlipidemia - fasting since 9am.  No new side effects, no myalgias, no abd pain.   Sister died before Christmas, found out nephew passed today.  Has been talking with pastor at church.  Denies depression, but down at times.  No insomnia.   Review of Systems  Constitutional: Negative for fatigue and unexpected weight change.  Respiratory: Negative for chest tightness and shortness of breath.   Cardiovascular: Negative for chest pain, palpitations and leg swelling.  Gastrointestinal: Negative for abdominal pain and blood in stool.       Lower abdomen/bladder area  Neurological: Negative for dizziness, syncope, light-headedness and headaches.       Objective:   Physical Exam  Constitutional: She is oriented to person, place, and time. She appears well-developed and well-nourished.       Overweight.   HENT:  Head: Normocephalic and atraumatic.  Mouth/Throat: Oropharynx is clear and moist.  Eyes: Conjunctivae normal and EOM are normal. Pupils are equal, round, and reactive to light.  Neck: Normal range of motion. No thyromegaly present.  Cardiovascular: Normal rate, regular rhythm, normal heart sounds and intact distal pulses.   No murmur heard. Pulmonary/Chest: Effort normal. She has no wheezes. She has no rales.  Abdominal: Soft. Normal appearance. She  exhibits no abdominal bruit and no pulsatile midline mass. There is no tenderness.  Neurological: She is alert and oriented to person, place, and time.       Microfilament testing WNL bilat feet  Skin: Skin is warm and dry.  Psychiatric: She has a normal mood and affect. Her behavior is normal. Judgment and thought content normal.   Results for orders placed in visit on 01/01/13  GLUCOSE, POCT (MANUAL RESULT ENTRY)      Component Value Range   POC Glucose 84  70 - 99 mg/dl  POCT GLYCOSYLATED HEMOGLOBIN (HGB A1C)      Component Value Range   Hemoglobin A1C 7.3        Assessment & Plan:  Susan Miles is a 67 y.o. female 1. Diabetes  POCT glucose (manual entry), POCT glycosylated hemoglobin (Hb A1C), Lipid panel, Comprehensive metabolic panel  2. Hyperlipidemia  Lipid panel  3. Hypertension  Comprehensive metabolic panel   DM2 - still borderline with Aic 7.2 to 7.3.  Stressed importance of exercise and diet and if she can work on these - can keep at same doses.  Understanding expressed.   HTN - cborderline - but a few borderline lows - did not take additional catapres.  Now exercising. Will continue same dose of meds as below. Continue to check home BP's.   Hyperlipidemia - check labs.  Cont same meds for now.    Recent deaths in family. Denies depression sx's at present.  Discussed  counseling if she would like and numbers provided. rtc precautions.   meds refilled again for 6 months   Meds ordered this encounter  Medications  . glucose blood test strip    Sig: Use as instructed    Dispense:  100 each    Refill:  12    Dx code 250.00 to check blood sugars daily one touch test strips  . cetirizine (ZYRTEC) 10 MG tablet    Sig: Take 1 tablet (10 mg total) by mouth daily.    Dispense:  90 tablet    Refill:  1  . cloNIDine (CATAPRES) 0.2 MG tablet    Sig: Take 1 tablet (0.2 mg total) by mouth 2 (two) times daily.    Dispense:  180 tablet    Refill:  1  . glipiZIDE (GLUCOTROL)  10 MG tablet    Sig: Take 1 tablet (10 mg total) by mouth 2 (two) times daily before a meal.    Dispense:  180 tablet    Refill:  1  . metFORMIN (GLUCOPHAGE) 1000 MG tablet    Sig: Take 1 tablet (1,000 mg total) by mouth 2 (two) times daily with a meal.    Dispense:  180 tablet    Refill:  1  . metoprolol (TOPROL-XL) 200 MG 24 hr tablet    Sig: Take 1 tablet (200 mg total) by mouth daily.    Dispense:  90 tablet    Refill:  1  . pravastatin (PRAVACHOL) 40 MG tablet    Sig: Take 1 tablet (40 mg total) by mouth daily.    Dispense:  90 tablet    Refill:  1  . saxagliptin HCl (ONGLYZA) 5 MG TABS tablet    Sig: Take 1 tablet (5 mg total) by mouth daily.    Dispense:  90 tablet    Refill:  1  . valsartan-hydrochlorothiazide (DIOVAN-HCT) 320-25 MG per tablet    Sig: Take 1 tablet by mouth daily.    Dispense:  90 tablet    Refill:  1    Patient Instructions  Continue to keep a log of your blood pressures so we will know how they are running. if your pressures are still high let us know. You should check it once a week.  Your goal for your blood pressure is less than 130/80, since you are diabetic. PLEASE BRING YOUR MEDICATIONS WITH YOU WHEN YOU RETURN TO THE CLINIC FOR A RECHECK. Try to continue your walking program, if you have access to a pool, please try to walk in the pool, or the machines that are less wear on your joints at the exercise facility. This will help with your overall health and will help also reduce the ankle discomfort you are having. Your test strips and medications were sent in to your pharmacy.  The number to hospice for grief therapy is: [phone] The phone numbers for therapists are: Karmen Bongo [phone]  [facility] [phone] Call us if anything else is needed.  Your should receive a call or letter about your lab results within the next week to 10 days.  Recheck in 3 months.

## 2013-01-01 NOTE — Patient Instructions (Addendum)
Continue to keep a log of your blood pressures so we will know how they are running. if your pressures are still high let us know. You should check it once a week.  Your goal for your blood pressure is less than 130/80, since you are diabetic. PLEASE BRING YOUR MEDICATIONS WITH YOU WHEN YOU RETURN TO THE CLINIC FOR A RECHECK. Try to continue your walking program, if you have access to a pool, please try to walk in the pool, or the machines that are less wear on your joints at the exercise facility. This will help with your overall health and will help also reduce the ankle discomfort you are having. Your test strips and medications were sent in to your pharmacy.  The number to hospice for grief therapy is: [phone] The phone numbers for therapists are: Karmen Bongo [phone]  [facility] [phone] Call us if anything else is needed.  Your should receive a call or letter about your lab results within the next week to 10 days.  Recheck in 3 months.

## 2013-01-02 LAB — LIPID PANEL
LDL Cholesterol: 98 mg/dL (ref 0–99)
Total CHOL/HDL Ratio: 4.4 Ratio
VLDL: 24 mg/dL (ref 0–40)

## 2013-01-02 LAB — COMPREHENSIVE METABOLIC PANEL
ALT: 20 U/L (ref 0–35)
AST: 19 U/L (ref 0–37)
Albumin: 4.2 g/dL (ref 3.5–5.2)
Alkaline Phosphatase: 45 U/L (ref 39–117)
Calcium: 10.6 mg/dL — ABNORMAL HIGH (ref 8.4–10.5)
Chloride: 103 mEq/L (ref 96–112)
Potassium: 3.5 mEq/L (ref 3.5–5.3)
Sodium: 139 mEq/L (ref 135–145)

## 2013-01-03 ENCOUNTER — Telehealth: Payer: Self-pay

## 2013-01-03 NOTE — Telephone Encounter (Signed)
Pt needs test strips for one touch ultra mini machine.  States the pharmacy has sent a fax.    [phone]

## 2013-01-04 MED ORDER — GLUCOSE BLOOD VI STRP: ORAL_STRIP | Status: DC

## 2013-01-04 NOTE — Telephone Encounter (Signed)
Called pharmacy and they stated they need a printed copy of Rx faxed to them w/Dx code and testing frequency for Medicare. I will print off Rx and send it in.  Printed and faxed Rx and notified pt on VM.

## 2013-01-12 ENCOUNTER — Telehealth: Payer: Self-pay

## 2013-01-12 NOTE — Telephone Encounter (Signed)
PT STATES SHE HAS BEEN TRYING FOR OVER A WEEK TO GET RX CALLED IN FOR HER TEST STRIPS TO [facility] ON [address] PER PHARMACY THEY HAVE HEARD NOTHING FROM [facility] PLEASE ALL PT AND/OR PHARMACY. PHARMACY NUMBER IS [phone]

## 2013-01-12 NOTE — Telephone Encounter (Signed)
Can we refill? 

## 2013-01-14 MED ORDER — GLUCOSE BLOOD VI STRP: ORAL_STRIP | Status: DC

## 2013-01-14 NOTE — Telephone Encounter (Signed)
Sent to [facility]

## 2013-01-15 NOTE — Telephone Encounter (Signed)
rx was sent to [facility] because medicare only covers it there.

## 2013-02-27 ENCOUNTER — Other Ambulatory Visit: Payer: Self-pay | Admitting: Family Medicine

## 2013-02-27 DIAGNOSIS — Z1231 Encounter for screening mammogram for malignant neoplasm of breast: Secondary | ICD-10-CM

## 2013-02-28 ENCOUNTER — Encounter: Payer: Self-pay | Admitting: Family Medicine

## 2013-03-25 ENCOUNTER — Encounter: Payer: Self-pay | Admitting: Family Medicine

## 2013-03-25 ENCOUNTER — Encounter: Payer: Self-pay | Admitting: *Deleted

## 2013-03-25 ENCOUNTER — Ambulatory Visit (INDEPENDENT_AMBULATORY_CARE_PROVIDER_SITE_OTHER): Payer: BC Managed Care – HMO | Admitting: Family Medicine

## 2013-03-25 VITALS — BP 130/80 | HR 71 | Temp 99.1°F | Resp 16 | Ht 64.0 in | Wt 202.0 lb

## 2013-03-25 DIAGNOSIS — Z0000 Encounter for general adult medical examination without abnormal findings: Secondary | ICD-10-CM

## 2013-03-25 DIAGNOSIS — J309 Allergic rhinitis, unspecified: Secondary | ICD-10-CM

## 2013-03-25 DIAGNOSIS — IMO0002 Reserved for concepts with insufficient information to code with codable children: Secondary | ICD-10-CM

## 2013-03-25 DIAGNOSIS — E1165 Type 2 diabetes mellitus with hyperglycemia: Secondary | ICD-10-CM

## 2013-03-25 DIAGNOSIS — E119 Type 2 diabetes mellitus without complications: Secondary | ICD-10-CM

## 2013-03-25 DIAGNOSIS — IMO0001 Reserved for inherently not codable concepts without codable children: Secondary | ICD-10-CM

## 2013-03-25 DIAGNOSIS — E785 Hyperlipidemia, unspecified: Secondary | ICD-10-CM

## 2013-03-25 DIAGNOSIS — I10 Essential (primary) hypertension: Secondary | ICD-10-CM

## 2013-03-25 DIAGNOSIS — Z23 Encounter for immunization: Secondary | ICD-10-CM

## 2013-03-25 LAB — LIPID PANEL
LDL Cholesterol: 105 mg/dL — ABNORMAL HIGH (ref 0–99)
Triglycerides: 92 mg/dL (ref <150)

## 2013-03-25 LAB — COMPREHENSIVE METABOLIC PANEL
ALT: 16 U/L (ref 0–35)
Albumin: 4.2 g/dL (ref 3.5–5.2)
Alkaline Phosphatase: 40 U/L (ref 39–117)
CO2: 31 mEq/L (ref 19–32)
Glucose, Bld: 103 mg/dL — ABNORMAL HIGH (ref 70–99)
Potassium: 3.7 mEq/L (ref 3.5–5.3)
Sodium: 139 mEq/L (ref 135–145)
Total Protein: 7.2 g/dL (ref 6.0–8.3)

## 2013-03-25 LAB — POCT URINALYSIS DIPSTICK
Blood, UA: NEGATIVE
Ketones, UA: NEGATIVE
Protein, UA: NEGATIVE
Spec Grav, UA: 1.025
Urobilinogen, UA: 0.2
pH, UA: 5.5

## 2013-03-25 LAB — POCT GLYCOSYLATED HEMOGLOBIN (HGB A1C): Hemoglobin A1C: 6.8

## 2013-03-25 MED ORDER — PRAVASTATIN SODIUM 40 MG PO TABS: 40.0000 mg | ORAL_TABLET | Freq: Every day | ORAL | Status: DC

## 2013-03-25 MED ORDER — CETIRIZINE HCL 10 MG PO TABS: 10.0000 mg | ORAL_TABLET | Freq: Every day | ORAL | Status: DC

## 2013-03-25 MED ORDER — GLIPIZIDE 10 MG PO TABS
10.0000 mg | ORAL_TABLET | Freq: Two times a day (BID) | ORAL | Status: DC
Start: 2013-03-25 — End: 2013-07-01

## 2013-03-25 MED ORDER — METFORMIN HCL 1000 MG PO TABS: 1000.0000 mg | ORAL_TABLET | Freq: Two times a day (BID) | ORAL | Status: DC

## 2013-03-25 MED ORDER — CLONIDINE HCL 0.2 MG PO TABS: 0.2000 mg | ORAL_TABLET | Freq: Two times a day (BID) | ORAL | Status: DC

## 2013-03-25 MED ORDER — METOPROLOL SUCCINATE ER 200 MG PO TB24: 200.0000 mg | ORAL_TABLET | Freq: Every day | ORAL | Status: DC

## 2013-03-25 MED ORDER — VALSARTAN-HYDROCHLOROTHIAZIDE 320-25 MG PO TABS: 1.0000 | ORAL_TABLET | Freq: Every day | ORAL | Status: DC

## 2013-03-25 MED ORDER — SAXAGLIPTIN HCL 5 MG PO TABS: 5.0000 mg | ORAL_TABLET | Freq: Every day | ORAL | Status: DC

## 2013-03-25 NOTE — Progress Notes (Signed)
Subjective:    Patient ID: Susan Miles, female    DOB: November 20, 1946, 67 y.o.   MRN: [id number]  HPI [name] is a 67 y.o. female Here for annual exam: fasting today.  Colonoscopy:2010, plan on repeat in 5 years - due next year.  Tdap:02/14/2007 Dentist - last week.  pneumovax 12/03/11.  Optho: last seen in February. No known retinopathy.  Mammogram: 03/12/12 - wnl.  Scheduled for next one - May 4th.  S/p hysterectomy in 80's for fibroids. , pap normal afterwards.  no new rashes, bleeding or irritated areas in perineum/vagina.  Advance Directives: full code, will discuss living will with family.   Doing well., did not need to meet with counselor - family members had died around time of last ov.  Denies depression. Been exercising and doing activities for enjoyment.   Dm2 - walking for exercise. A1c7.2 on 09/17/12, then 7.3 on 01/01/13 with weight 210 that ov. Labs as below.  Planned on diet changes, exercise for improved control. Exercise class 3 days a week. Weight down 8 pounds since last ov. Now at 202.   Home blood sugars - 97-199. Has min changes in diet. Met with nutritionist years ago. No symptomatic lows.   Results for orders placed in visit on 01/01/13  LIPID PANEL      Result Value Range   Cholesterol 158  0 - 200 mg/dL   Triglycerides 952  <841 mg/dL   HDL 36 (*) >32 mg/dL   Total CHOL/HDL Ratio 4.4     VLDL 24  0 - 40 mg/dL   LDL Cholesterol 98  0 - 99 mg/dL  COMPREHENSIVE METABOLIC PANEL      Result Value Range   Sodium 139  135 - 145 mEq/L   Potassium 3.5  3.5 - 5.3 mEq/L   Chloride 103  96 - 112 mEq/L   CO2 29  19 - 32 mEq/L   Glucose, Bld 90  70 - 99 mg/dL   BUN 18  6 - 23 mg/dL   Creat 4.40  1.02 - 7.25 mg/dL   Total Bilirubin 0.7  0.3 - 1.2 mg/dL   Alkaline Phosphatase 45  39 - 117 U/L   AST 19  0 - 37 U/L   ALT 20  0 - 35 U/L   Total Protein 6.9  6.0 - 8.3 g/dL   Albumin 4.2  3.5 - 5.2 g/dL   Calcium 36.6 (*) 8.4 - 10.5 mg/dL  GLUCOSE, POCT (MANUAL  RESULT ENTRY)      Result Value Range   POC Glucose 84  70 - 99 mg/dl  POCT GLYCOSYLATED HEMOGLOBIN (HGB A1C)      Result Value Range   Hemoglobin A1C 7.3     HTN -  Home BP's last ov - 116/83.  to 154/90. 130'2 - 150's at times.  Decided to remain at same dose of catapres - 0.2mg  BID, and diovan hct. Home bp's: 111/70- 141/83.   Hyperlipidemia- cholesterol borderline for diabetic as above. Tolerated pravachol at 40mg  qd, but plan on diet changes and attempt on weight loss for improved control. No new myalgias.  Allergic rhinitis - zyrtec every day.,  No recent flairs. No sedation or other se with zyrtec.   Review of Systems 13 point review of systems per patient health survey noted.  Negative other than as indicated on reviewed nursing note.      Objective:   Physical Exam  Nursing note and vitals reviewed. Constitutional: She is oriented  to person, place, and time. She appears well-developed and well-nourished.  HENT:  Head: Normocephalic and atraumatic.  Right Ear: External ear normal.  Left Ear: External ear normal.  Mouth/Throat: Oropharynx is clear and moist.  Eyes: Conjunctivae are normal. Pupils are equal, round, and reactive to light.  Neck: Normal range of motion. Neck supple. No thyromegaly present.  Cardiovascular: Normal rate, regular rhythm, normal heart sounds and intact distal pulses.   No murmur heard. Pulmonary/Chest: Effort normal and breath sounds normal. No respiratory distress. She has no wheezes. Right breast exhibits no inverted nipple, no mass and no skin change. Left breast exhibits no inverted nipple, no mass and no skin change. Breasts are symmetrical.  Abdominal: Soft. Bowel sounds are normal. There is no tenderness.  Musculoskeletal: Normal range of motion. She exhibits no edema and no tenderness.       Right shoulder: Normal.       Left shoulder: Normal.       Right elbow: Normal.      Left elbow: Normal.       Right wrist: Normal.       Left  wrist: Normal.       Right hip: Normal.       Left hip: Normal.       Right knee: Normal.       Left knee: Normal.       Right ankle: Normal.       Left ankle: Normal.       Thoracic back: Normal.       Lumbar back: Normal.  Lymphadenopathy:    She has no cervical adenopathy.    She has no axillary adenopathy.       Right: No supraclavicular adenopathy present.       Left: No supraclavicular adenopathy present.  Neurological: She is alert and oriented to person, place, and time.  Skin: Skin is warm and dry. No rash noted.  Psychiatric: She has a normal mood and affect. Her behavior is normal. Thought content normal.     Results for orders placed in visit on 03/25/13  GLUCOSE, POCT (MANUAL RESULT ENTRY)      Result Value Range   POC Glucose 115 (*) 70 - 99 mg/dl  POCT GLYCOSYLATED HEMOGLOBIN (HGB A1C)      Result Value Range   Hemoglobin A1C 6.8    POCT URINALYSIS DIPSTICK      Result Value Range   Color, UA yellow     Clarity, UA clear     Glucose, UA neg     Bilirubin, UA neg     Ketones, UA neg     Spec Grav, UA 1.025     Blood, UA neg     pH, UA 5.5     Protein, UA neg     Urobilinogen, UA 0.2     Nitrite, UA neg     Leukocytes, UA Negative         Assessment & Plan:  [name] is a 67 y.o. female Annual physical exam - Plan: IFOBT POC (occult bld, rslt in office)  Other and unspecified hyperlipidemia - Plan: Comprehensive metabolic panel, Lipid panel, Amb ref to Medical Nutrition Therapy-MNT  HTN (hypertension) - Plan: Comprehensive metabolic panel, Lipid panel, valsartan-hydrochlorothiazide (DIOVAN-HCT) 320-25 MG per tablet, metoprolol (TOPROL-XL) 200 MG 24 hr tablet, cloNIDine (CATAPRES) 0.2 MG tablet, Amb ref to Medical Nutrition Therapy-MNT  DM (diabetes mellitus), type 2, uncontrolled - Plan: POCT glucose (manual entry), POCT glycosylated hemoglobin (Hb  A1C), Microalbumin, urine, POCT urinalysis dipstick, Amb ref to Medical Nutrition Therapy-MNT  DM2  (diabetes mellitus, type 2) - Plan: saxagliptin HCl (ONGLYZA) 5 MG TABS tablet, pravastatin (PRAVACHOL) 40 MG tablet, metFORMIN (GLUCOPHAGE) 1000 MG tablet, glipiZIDE (GLUCOTROL) 10 MG tablet  Allergic rhinitis - Plan: cetirizine (ZYRTEC) 10 MG tablet, Amb ref to Medical Nutrition Therapy-MNT  CPE/annual exam.  Anticipatory guidance discussed.  Continue with wt loss efforts. mmg pending, colonoscopy due next year.  hemosure sent, labs as above.  Paper rx for shingles vaccination.   DM2 - commended on wt loss with exercise and improved DM control.  Continue same dose of glucotrol and metformin. Will refer to nutritionist for discussion of diet with fluctuations in levels during the day. If blood sugars are below 100, can decrease glucotrol to 1/2 pill bid - hypoglycemia precautions discussed.   HTN - borderline control, but usually in 110-120 range systolic, and with wt loss and exercise, continue same meds.   Hyperlipidemia - check lipids, cmp.  Continue pravachol 40mg  qd.    All rhinitis - stable - continue zyrtec  Meds ordered this encounter  Medications  . valsartan-hydrochlorothiazide (DIOVAN-HCT) 320-25 MG per tablet    Sig: Take 1 tablet by mouth daily.    Dispense:  90 tablet    Refill:  1  . saxagliptin HCl (ONGLYZA) 5 MG TABS tablet    Sig: Take 1 tablet (5 mg total) by mouth daily.    Dispense:  90 tablet    Refill:  1  . pravastatin (PRAVACHOL) 40 MG tablet    Sig: Take 1 tablet (40 mg total) by mouth daily.    Dispense:  90 tablet    Refill:  1  . metoprolol (TOPROL-XL) 200 MG 24 hr tablet    Sig: Take 1 tablet (200 mg total) by mouth daily.    Dispense:  90 tablet    Refill:  1  . metFORMIN (GLUCOPHAGE) 1000 MG tablet    Sig: Take 1 tablet (1,000 mg total) by mouth 2 (two) times daily with a meal.    Dispense:  180 tablet    Refill:  1  . glipiZIDE (GLUCOTROL) 10 MG tablet    Sig: Take 1 tablet (10 mg total) by mouth 2 (two) times daily before a meal.     Dispense:  180 tablet    Refill:  1  . cloNIDine (CATAPRES) 0.2 MG tablet    Sig: Take 1 tablet (0.2 mg total) by mouth 2 (two) times daily.    Dispense:  180 tablet    Refill:  1  . cetirizine (ZYRTEC) 10 MG tablet    Sig: Take 1 tablet (10 mg total) by mouth daily.    Dispense:  90 tablet    Refill:  1   Patient Instructions  Continue your same medicines and exercises.  We will refer you to a nutritionist.  If your blood sugars run below 100, decrease your glucotrol dose to 1/2 pill twice per day.  Recheck in 3 months. Your should receive a call or letter about your lab results within the next week to 10 days.  Keeping You Healthy  Get These Tests  Blood Pressure- Have your blood pressure checked by your healthcare provider at least once a year.  Normal blood pressure is 120/80.  Weight- Have your body mass index (BMI) calculated to screen for obesity.  BMI is a measure of body fat based on height and weight.  You can calculate your own BMI  at https://www.west-esparza.com/  Cholesterol- Have your cholesterol checked every year.  Diabetes- Have your blood sugar checked every year if you have high blood pressure, high cholesterol, a family history of diabetes or if you are overweight.  Pap Smear- Have a pap smear every 1 to 3 years if you have been sexually active.  If you are older than 65 and recent pap smears have been normal you may not need additional pap smears.  In addition, if you have had a hysterectomy  For benign disease additional pap smears are not necessary.  Mammogram-Yearly mammograms are essential for early detection of breast cancer  Screening for Colon Cancer- Colonoscopy starting at age 53. Screening may begin sooner depending on your family history and other health conditions.  Follow up colonoscopy as directed by your Gastroenterologist.  Screening for Osteoporosis- Screening begins at age 27 with bone density scanning, sooner if you are at higher risk for developing  Osteoporosis.  Get these medicines  Calcium with Vitamin D- Your body requires 1200-1500 mg of Calcium a day and [phone] IU of Vitamin D a day.  You can only absorb 500 mg of Calcium at a time therefore Calcium must be taken in 2 or 3 separate doses throughout the day.  Hormones- Hormone therapy has been associated with increased risk for certain cancers and heart disease.  Talk to your healthcare provider about if you need relief from menopausal symptoms.  Aspirin- Ask your healthcare provider about taking Aspirin to prevent Heart Disease and Stroke.  Get these Immuniztions  Flu shot- Every fall  Pneumonia shot- Once after the age of 67; if you are younger ask your healthcare provider if you need a pneumonia shot.  Tetanus- Every ten years.  Zostavax- Once after the age of 2 to prevent shingles.  Take these steps  Don't smoke- Your healthcare provider can help you quit. For tips on how to quit, ask your healthcare provider or go to www.smokefree.gov or call 1-800 QUIT-NOW.  Be physically active- Exercise 5 days a week for a minimum of 30 minutes.  If you are not already physically active, start slow and gradually work up to 30 minutes of moderate physical activity.  Try walking, dancing, bike riding, swimming, etc.  Eat a healthy diet- Eat a variety of healthy foods such as fruits, vegetables, whole grains, low fat milk, low fat cheeses, yogurt, lean meats, chicken, fish, eggs, dried beans, tofu, etc.  For more information go to www.thenutritionsource.org  Dental visit- Brush and floss teeth twice daily; visit your dentist twice a year.  Eye exam- Visit your Optometrist or Ophthalmologist yearly.  Drink alcohol in moderation- Limit alcohol intake to one drink or less a day.  Never drink and drive.  Depression- Your emotional health is as important as your physical health.  If you're feeling down or losing interest in things you normally enjoy, please talk to your healthcare  provider.  Seat Belts- can save your life; always wear one  Smoke/Carbon Monoxide detectors- These detectors need to be installed on the appropriate level of your home.  Replace batteries at least once a year.  Violence- If anyone is threatening or hurting you, please tell your healthcare provider.  Living Will/ Health care power of attorney- Discuss with your healthcare provider and family.

## 2013-03-25 NOTE — Patient Instructions (Signed)
Continue your same medicines and exercises.  We will refer you to a nutritionist.  If your blood sugars run below 100, decrease your glucotrol dose to 1/2 pill twice per day.  Recheck in 3 months. Your should receive a call or letter about your lab results within the next week to 10 days.  Keeping You Healthy  Get These Tests  Blood Pressure- Have your blood pressure checked by your healthcare provider at least once a year.  Normal blood pressure is 120/80.  Weight- Have your body mass index (BMI) calculated to screen for obesity.  BMI is a measure of body fat based on height and weight.  You can calculate your own BMI at https://www.west-esparza.com/  Cholesterol- Have your cholesterol checked every year.  Diabetes- Have your blood sugar checked every year if you have high blood pressure, high cholesterol, a family history of diabetes or if you are overweight.  Pap Smear- Have a pap smear every 1 to 3 years if you have been sexually active.  If you are older than 65 and recent pap smears have been normal you may not need additional pap smears.  In addition, if you have had a hysterectomy  For benign disease additional pap smears are not necessary.  Mammogram-Yearly mammograms are essential for early detection of breast cancer  Screening for Colon Cancer- Colonoscopy starting at age 32. Screening may begin sooner depending on your family history and other health conditions.  Follow up colonoscopy as directed by your Gastroenterologist.  Screening for Osteoporosis- Screening begins at age 2 with bone density scanning, sooner if you are at higher risk for developing Osteoporosis.  Get these medicines  Calcium with Vitamin D- Your body requires 1200-1500 mg of Calcium a day and [phone] IU of Vitamin D a day.  You can only absorb 500 mg of Calcium at a time therefore Calcium must be taken in 2 or 3 separate doses throughout the day.  Hormones- Hormone therapy has been associated with increased  risk for certain cancers and heart disease.  Talk to your healthcare provider about if you need relief from menopausal symptoms.  Aspirin- Ask your healthcare provider about taking Aspirin to prevent Heart Disease and Stroke.  Get these Immuniztions  Flu shot- Every fall  Pneumonia shot- Once after the age of 86; if you are younger ask your healthcare provider if you need a pneumonia shot.  Tetanus- Every ten years.  Zostavax- Once after the age of 80 to prevent shingles.  Take these steps  Don't smoke- Your healthcare provider can help you quit. For tips on how to quit, ask your healthcare provider or go to www.smokefree.gov or call 1-800 QUIT-NOW.  Be physically active- Exercise 5 days a week for a minimum of 30 minutes.  If you are not already physically active, start slow and gradually work up to 30 minutes of moderate physical activity.  Try walking, dancing, bike riding, swimming, etc.  Eat a healthy diet- Eat a variety of healthy foods such as fruits, vegetables, whole grains, low fat milk, low fat cheeses, yogurt, lean meats, chicken, fish, eggs, dried beans, tofu, etc.  For more information go to www.thenutritionsource.org  Dental visit- Brush and floss teeth twice daily; visit your dentist twice a year.  Eye exam- Visit your Optometrist or Ophthalmologist yearly.  Drink alcohol in moderation- Limit alcohol intake to one drink or less a day.  Never drink and drive.  Depression- Your emotional health is as important as your physical health.  If  you're feeling down or losing interest in things you normally enjoy, please talk to your healthcare provider.  Seat Belts- can save your life; always wear one  Smoke/Carbon Monoxide detectors- These detectors need to be installed on the appropriate level of your home.  Replace batteries at least once a year.  Violence- If anyone is threatening or hurting you, please tell your healthcare provider.  Living Will/ Health care power of  attorney- Discuss with your healthcare provider and family.

## 2013-03-25 NOTE — Progress Notes (Signed)
  Subjective:    Patient ID: Susan Miles, female    DOB: [birth year], 67 y.o.   MRN: [id number]  HPI    Review of Systems  Constitutional: Negative.   HENT: Negative.   Eyes: Negative.   Respiratory: Negative.   Cardiovascular: Negative.   Gastrointestinal: Negative.   Endocrine: Negative.   Genitourinary: Negative.   Allergic/Immunologic: Negative.   Neurological: Negative.   Hematological: Negative.   Psychiatric/Behavioral: Negative.        Objective:   Physical Exam        Assessment & Plan:

## 2013-04-01 ENCOUNTER — Ambulatory Visit: Payer: BC Managed Care – HMO | Admitting: Family Medicine

## 2013-04-08 ENCOUNTER — Ambulatory Visit (HOSPITAL_COMMUNITY)
Admission: RE | Admit: 2013-04-08 | Discharge: 2013-04-08 | Disposition: A | Discharge status: Home / Self Care | Payer: Medicare Other | Source: Ambulatory Visit | Attending: Family Medicine | Admitting: Family Medicine

## 2013-04-08 DIAGNOSIS — Z1231 Encounter for screening mammogram for malignant neoplasm of breast: Secondary | ICD-10-CM | POA: Insufficient documentation

## 2013-05-09 ENCOUNTER — Ambulatory Visit: Payer: Medicare Other | Admitting: *Deleted

## 2013-07-01 ENCOUNTER — Ambulatory Visit (INDEPENDENT_AMBULATORY_CARE_PROVIDER_SITE_OTHER): Payer: BC Managed Care – HMO | Admitting: Family Medicine

## 2013-07-01 ENCOUNTER — Encounter: Payer: Self-pay | Admitting: Family Medicine

## 2013-07-01 VITALS — BP 140/82 | HR 70 | Temp 99.1°F | Resp 16 | Ht 63.5 in | Wt 205.8 lb

## 2013-07-01 DIAGNOSIS — I10 Essential (primary) hypertension: Secondary | ICD-10-CM

## 2013-07-01 DIAGNOSIS — E119 Type 2 diabetes mellitus without complications: Secondary | ICD-10-CM

## 2013-07-01 DIAGNOSIS — M5430 Sciatica, unspecified side: Secondary | ICD-10-CM

## 2013-07-01 DIAGNOSIS — M5431 Sciatica, right side: Secondary | ICD-10-CM

## 2013-07-01 LAB — GLUCOSE, POCT (MANUAL RESULT ENTRY): POC Glucose: 102 mg/dl — AB (ref 70–99)

## 2013-07-01 MED ORDER — GLIPIZIDE 10 MG PO TABS: 10.0000 mg | ORAL_TABLET | Freq: Two times a day (BID) | ORAL | Status: DC

## 2013-07-01 MED ORDER — CYCLOBENZAPRINE HCL 5 MG PO TABS: ORAL_TABLET | ORAL | Status: DC

## 2013-07-01 MED ORDER — PRAVASTATIN SODIUM 40 MG PO TABS: 40.0000 mg | ORAL_TABLET | Freq: Every day | ORAL | Status: DC

## 2013-07-01 MED ORDER — CLONIDINE HCL 0.2 MG PO TABS: 0.2000 mg | ORAL_TABLET | Freq: Two times a day (BID) | ORAL | Status: DC

## 2013-07-01 MED ORDER — VALSARTAN-HYDROCHLOROTHIAZIDE 320-25 MG PO TABS: 1.0000 | ORAL_TABLET | Freq: Every day | ORAL | Status: DC

## 2013-07-01 MED ORDER — METFORMIN HCL 1000 MG PO TABS: 1000.0000 mg | ORAL_TABLET | Freq: Two times a day (BID) | ORAL | Status: DC

## 2013-07-01 MED ORDER — METOPROLOL SUCCINATE ER 200 MG PO TB24: 200.0000 mg | ORAL_TABLET | Freq: Every day | ORAL | Status: DC

## 2013-07-01 NOTE — Patient Instructions (Signed)
Keep a record of your blood pressures and sugars outside of the office and bring them to the next office visit. Try tylenol for mild leg symptoms, flexeril only if needed.  Ok to try off onglyza for now, but work on weight loss to maintain diabetes control. If blood sugars over 200 -restart this medicine.

## 2013-07-01 NOTE — Progress Notes (Signed)
Subjective:    Patient ID: Susan Miles, female    DOB: 1946/06/24, 67 y.o.   MRN: [id number]  HPI Susan Miles is a 67 y.o. female S/p CPE in 4/14.  Follow up: HTN - outside BP's - 128/83. Usually in low 120's. No new cough or other side effects. No missed doses of meds.   DM2 - Aic 6.8 on 03/25/13 physical - taking onglyza 5mg  qd - but would like to try off this due to cost.  Home blood sugars 120-164,  Lowest 119. No symptomatic lows. Tolerating metformin 100mg BID, and glucotrol 10mg  BID. Weight 202 to 205 since last ov. Dentist next month. Has had recent optho.   Lipids: LDL 105 at 4/14 physical, no new myalgias.   Hx of sciatic sx's prior treated last year with flexeril with good relief - recently has had a few flairs since last ov - 2 episodes. Pain into R leg/thigh.  Usually lasts about a day or so. Took last mm relaxer recenlty - but resolves sx's. Works better than [employer]   Exercising at gym with water aerobics.      Review of Systems  Constitutional: Negative for fatigue and unexpected weight change.  Respiratory: Negative for chest tightness and shortness of breath.   Cardiovascular: Negative for chest pain, palpitations and leg swelling.  Gastrointestinal: Negative for abdominal pain and blood in stool.  Musculoskeletal: Positive for arthralgias.  Neurological: Negative for dizziness, syncope, light-headedness and headaches.       Objective:   Physical Exam  Vitals reviewed. Constitutional: She is oriented to person, place, and time. She appears well-developed and well-nourished.  HENT:  Head: Normocephalic and atraumatic.  Mouth/Throat: Oropharynx is clear and moist.  Eyes: Conjunctivae and EOM are normal. Pupils are equal, round, and reactive to light.  Neck: Normal range of motion. No thyromegaly present.  Cardiovascular: Normal rate, regular rhythm, normal heart sounds and intact distal pulses.   No murmur heard. Pulmonary/Chest: Effort normal. She has no  wheezes. She has no rales.  Abdominal: Soft. Normal appearance. She exhibits no abdominal bruit and no pulsatile midline mass. There is no tenderness.  Neurological: She is alert and oriented to person, place, and time.  Microfilament testing WNL bilat feet  Skin: Skin is warm and dry.  Psychiatric: She has a normal mood and affect. Her behavior is normal.      Results for orders placed in visit on 07/01/13  POCT GLYCOSYLATED HEMOGLOBIN (HGB A1C)      Result Value Range   Hemoglobin A1C 6.9    GLUCOSE, POCT (MANUAL RESULT ENTRY)      Result Value Range   POC Glucose 102 (*) 70 - 99 mg/dl       Assessment & Plan:  Susan Miles is a 67 y.o. female Type II or unspecified type diabetes mellitus without mention of complication, not stated as uncontrolled - Plan: POCT glycosylated hemoglobin (Hb A1C), POCT glucose (manual entry)  HTN (hypertension) - Plan: cloNIDine (CATAPRES) 0.2 MG tablet, metoprolol (TOPROL-XL) 200 MG 24 hr tablet, valsartan-hydrochlorothiazide (DIOVAN-HCT) 320-25 MG per tablet  Sciatica neuralgia, right - Plan: cyclobenzaprine (FLEXERIL) 5 MG tablet  DM2 (diabetes mellitus, type 2) - Plan: glipiZIDE (GLUCOTROL) 10 MG tablet, metFORMIN (GLUCOPHAGE) 1000 MG tablet, pravastatin (PRAVACHOL) 40 MG tablet  HTN - stable, cont current regimen.   DM2- controlled. Can try off onglyza d/t cost, but to maintain control - will need to work on weight loss.  If blood sugars go over 200 off  the Onglyza - restart this medicine. Can look at other options next visit if needed.  Continue glucotrol and metformin same doses. Continue statin, ASA qd. Recheck 3 months.   R leg pain, possible DDD/sciatica. Improved with flexeril - can use intermittently - sed, but if increase in sx's or persistent pain - rtc for eval. Can try tylenol as well for mild sx's.    Meds ordered this encounter  Medications  . cloNIDine (CATAPRES) 0.2 MG tablet    Sig: Take 1 tablet (0.2 mg total) by mouth 2  (two) times daily.    Dispense:  180 tablet    Refill:  1  . glipiZIDE (GLUCOTROL) 10 MG tablet    Sig: Take 1 tablet (10 mg total) by mouth 2 (two) times daily before a meal.    Dispense:  180 tablet    Refill:  1  . metFORMIN (GLUCOPHAGE) 1000 MG tablet    Sig: Take 1 tablet (1,000 mg total) by mouth 2 (two) times daily with a meal.    Dispense:  180 tablet    Refill:  1  . metoprolol (TOPROL-XL) 200 MG 24 hr tablet    Sig: Take 1 tablet (200 mg total) by mouth daily.    Dispense:  90 tablet    Refill:  1  . pravastatin (PRAVACHOL) 40 MG tablet    Sig: Take 1 tablet (40 mg total) by mouth daily.    Dispense:  90 tablet    Refill:  1  . valsartan-hydrochlorothiazide (DIOVAN-HCT) 320-25 MG per tablet    Sig: Take 1 tablet by mouth daily.    Dispense:  90 tablet    Refill:  1  . cyclobenzaprine (FLEXERIL) 5 MG tablet    Sig: 1 pill by mouth up to every 8 hours as needed. Start with one pill by mouth each bedtime as needed due to sedation    Dispense:  15 tablet    Refill:  0   Patient Instructions  Keep a record of your blood pressures and sugars outside of the office and bring them to the next office visit. Try tylenol for mild leg symptoms, flexeril only if needed.  Ok to try off onglyza for now, but work on weight loss to maintain diabetes control. If blood sugars over 200 -restart this medicine.

## 2013-09-30 ENCOUNTER — Encounter: Payer: Self-pay | Admitting: Family Medicine

## 2013-09-30 ENCOUNTER — Ambulatory Visit (INDEPENDENT_AMBULATORY_CARE_PROVIDER_SITE_OTHER): Payer: Medicare Other | Admitting: Family Medicine

## 2013-09-30 VITALS — BP 120/80 | HR 74 | Temp 98.7°F | Resp 16 | Ht 63.5 in | Wt 205.0 lb

## 2013-09-30 DIAGNOSIS — Z23 Encounter for immunization: Secondary | ICD-10-CM

## 2013-09-30 DIAGNOSIS — E119 Type 2 diabetes mellitus without complications: Secondary | ICD-10-CM

## 2013-09-30 DIAGNOSIS — E785 Hyperlipidemia, unspecified: Secondary | ICD-10-CM

## 2013-09-30 DIAGNOSIS — I10 Essential (primary) hypertension: Secondary | ICD-10-CM

## 2013-09-30 MED ORDER — METFORMIN HCL 1000 MG PO TABS: 1000.0000 mg | ORAL_TABLET | Freq: Two times a day (BID) | ORAL | Status: DC

## 2013-09-30 MED ORDER — VALSARTAN-HYDROCHLOROTHIAZIDE 320-25 MG PO TABS: 1.0000 | ORAL_TABLET | Freq: Every day | ORAL | Status: DC

## 2013-09-30 MED ORDER — SAXAGLIPTIN HCL 5 MG PO TABS: 5.0000 mg | ORAL_TABLET | Freq: Every day | ORAL | Status: DC

## 2013-09-30 MED ORDER — CLONIDINE HCL 0.2 MG PO TABS: 0.2000 mg | ORAL_TABLET | Freq: Two times a day (BID) | ORAL | Status: DC

## 2013-09-30 MED ORDER — GLUCOSE BLOOD VI STRP: ORAL_STRIP | Status: DC

## 2013-09-30 MED ORDER — PRAVASTATIN SODIUM 40 MG PO TABS: 40.0000 mg | ORAL_TABLET | Freq: Every day | ORAL | Status: DC

## 2013-09-30 MED ORDER — METOPROLOL SUCCINATE ER 200 MG PO TB24: 200.0000 mg | ORAL_TABLET | Freq: Every day | ORAL | Status: DC

## 2013-09-30 MED ORDER — GLIPIZIDE 10 MG PO TABS: 10.0000 mg | ORAL_TABLET | Freq: Two times a day (BID) | ORAL | Status: DC

## 2013-09-30 NOTE — Progress Notes (Signed)
Subjective:    Patient ID: Susan Miles, female    DOB: 07-06-1946, 67 y.o.   MRN: [id number]  HPI  Susan Miles is a 67 y.o. female  DM2 - A1c 6.9 on 07/01/13. Stopped Onglyza d/t cost. Continued metformin and glipizide. Has not restarted onglyza. Home glucose readings - 180, 190, 133, 150-160 recently. Weight 205 today - same as last visit. Exercising 3 times per week - no change form July. No new diet changes.   HTN - stable last ov. Home readings - 115/70- 140/86. No chest pain or lightheadedness.  Feels great. No new side effects of medicine. Last creatinine wnl at 0.96 in 03/2013.   Hyperlipidemia - lipids wnl except LDL slightly up at 105 in April of this year. Takes pravachol 40mg  qd.  Fasting  - last po - toast and juice at 8:30 this morning.    Patient Active Problem List   Diagnosis Date Noted  . HTN (hypertension) 02/10/2012  . DM2 (diabetes mellitus, type 2) 02/10/2012   No past medical history on file. No past surgical history on file. Allergies  Allergen Reactions  . Ace Inhibitors Cough   Prior to Admission medications   Medication Sig Start Date End Date Taking? Authorizing Provider  aspirin 81 MG tablet Take 81 mg by mouth daily.   Yes Historical Provider, MD  cetirizine (ZYRTEC) 10 MG tablet Take 1 tablet (10 mg total) by mouth daily. 03/25/13  Yes Shade Flood, MD  cloNIDine (CATAPRES) 0.2 MG tablet Take 1 tablet (0.2 mg total) by mouth 2 (two) times daily. 07/01/13  Yes Shade Flood, MD  glipiZIDE (GLUCOTROL) 10 MG tablet Take 1 tablet (10 mg total) by mouth 2 (two) times daily before a meal. 07/01/13  Yes Shade Flood, MD  glucose blood test strip Use to test blood sugars daily. Dx code 250.00. 01/14/13  Yes Godfrey Pick, PA-C  metFORMIN (GLUCOPHAGE) 1000 MG tablet Take 1 tablet (1,000 mg total) by mouth 2 (two) times daily with a meal. 07/01/13  Yes Shade Flood, MD  metoprolol (TOPROL-XL) 200 MG 24 hr tablet Take 1 tablet (200 mg total) by  mouth daily. 07/01/13  Yes Shade Flood, MD  pravastatin (PRAVACHOL) 40 MG tablet Take 1 tablet (40 mg total) by mouth daily. 07/01/13  Yes Shade Flood, MD  valsartan-hydrochlorothiazide (DIOVAN-HCT) 320-25 MG per tablet Take 1 tablet by mouth daily. 07/01/13  Yes Shade Flood, MD  cyclobenzaprine (FLEXERIL) 5 MG tablet 1 pill by mouth up to every 8 hours as needed. Start with one pill by mouth each bedtime as needed due to sedation 07/01/13   Shade Flood, MD  saxagliptin HCl (ONGLYZA) 5 MG TABS tablet Take 1 tablet (5 mg total) by mouth daily. 03/25/13   Shade Flood, MD  triamcinolone cream (KENALOG) 0.1 % Apply topically 2 (two) times daily. 09/17/12   Shade Flood, MD   History   Social History  . Marital Status: Married    Spouse Name: N/A    Number of Children: N/A  . Years of Education: N/A   Occupational History  . Not on file.   Social History Main Topics  . Smoking status: Never Smoker   . Smokeless tobacco: Never Used  . Alcohol Use: Not on file  . Drug Use: Not on file  . Sexual Activity: Not on file   Other Topics Concern  . Not on file   Social History Narrative  .  No narrative on file   Review of Systems  Constitutional: Negative for fatigue and unexpected weight change.  Respiratory: Negative for chest tightness and shortness of breath.   Cardiovascular: Negative for chest pain, palpitations and leg swelling.  Gastrointestinal: Negative for abdominal pain and blood in stool.  Neurological: Negative for dizziness, syncope, light-headedness and headaches.       Objective:   Physical Exam  Vitals reviewed. Constitutional: She is oriented to person, place, and time. She appears well-developed and well-nourished.  HENT:  Head: Normocephalic and atraumatic.  Mouth/Throat: Oropharynx is clear and moist.  Eyes: Conjunctivae and EOM are normal. Pupils are equal, round, and reactive to light.  Neck: Normal range of motion. No thyromegaly  present.  Cardiovascular: Normal rate, regular rhythm, normal heart sounds and intact distal pulses.   No murmur heard. Pulmonary/Chest: Effort normal. She has no wheezes. She has no rales.  Abdominal: Soft. Normal appearance. She exhibits no abdominal bruit and no pulsatile midline mass. There is no tenderness.  Neurological: She is alert and oriented to person, place, and time.  Microfilament testing WNL bilat feet  Skin: Skin is warm and dry.  Psychiatric: She has a normal mood and affect. Her behavior is normal.    Results for orders placed in visit on 09/30/13  GLUCOSE, POCT (MANUAL RESULT ENTRY)      Result Value Range   POC Glucose 139 (*) 70 - 99 mg/dl  POCT GLYCOSYLATED HEMOGLOBIN (HGB A1C)      Result Value Range   Hemoglobin A1C 7.6        Assessment & Plan:   Susan Miles is a 67 y.o. female Need for prophylactic vaccination and inoculation against influenza - Plan: Flu Vaccine QUAD 36+ mos IM  Type II or unspecified type diabetes mellitus without mention of complication, not stated as uncontrolled - Plan: HM Diabetes Foot Exam, POCT glucose (manual entry), POCT glycosylated hemoglobin (Hb A1C) -  less controlled.  Plan to increase exercise to 5 days per week, work on diet changes, and restart Onglyza for now, but if weight and blood sugars improve - may be able to look at stopping this again in future. Recheck A1c in 3 months.   HTN (hypertension) - Plan: valsartan-hydrochlorothiazide (DIOVAN-HCT) 320-25 MG per tablet, metoprolol (TOPROL-XL) 200 MG 24 hr tablet, cloNIDine (CATAPRES) 0.2 MG  - controlled. meds refilled. CMP pending.    Other and unspecified hyperlipidemia - Plan: Comprehensive metabolic panel, Lipid panel pending. Continue pravachol.    Meds ordered this encounter  Medications  . valsartan-hydrochlorothiazide (DIOVAN-HCT) 320-25 MG per tablet    Sig: Take 1 tablet by mouth daily.    Dispense:  90 tablet    Refill:  1  . saxagliptin HCl (ONGLYZA) 5  MG TABS tablet    Sig: Take 1 tablet (5 mg total) by mouth daily.    Dispense:  90 tablet    Refill:  1  . pravastatin (PRAVACHOL) 40 MG tablet    Sig: Take 1 tablet (40 mg total) by mouth daily.    Dispense:  90 tablet    Refill:  1  . metoprolol (TOPROL-XL) 200 MG 24 hr tablet    Sig: Take 1 tablet (200 mg total) by mouth daily.    Dispense:  90 tablet    Refill:  1  . metFORMIN (GLUCOPHAGE) 1000 MG tablet    Sig: Take 1 tablet (1,000 mg total) by mouth 2 (two) times daily with a meal.    Dispense:  180 tablet    Refill:  1  . glipiZIDE (GLUCOTROL) 10 MG tablet    Sig: Take 1 tablet (10 mg total) by mouth 2 (two) times daily before a meal.    Dispense:  180 tablet    Refill:  1  . cloNIDine (CATAPRES) 0.2 MG tablet    Sig: Take 1 tablet (0.2 mg total) by mouth 2 (two) times daily.    Dispense:  180 tablet    Refill:  1  . glucose blood test strip    Sig: Use to test blood sugars daily. Dx code 250.00.    Dispense:  100 each    Refill:  12    Dx code 250.00 to check blood sugars daily. Test strips for One Touch Ultra Mini.    Order Specific Question:  Supervising Provider    Answer:  Ethelda Chick [[id number]]   Patient Instructions  Increase exercise to 5 days per week, work on diet changes, and restart Onglyza for now, but if weight and blood sugars improve - may be able to look at stopping this again in future. No other changes in medicines for now.  You should receive a call or letter about your lab results within the next week to 10 days.  Recheck in next 3 months. Return to the clinic or go to the nearest emergency room if any of your symptoms worsen or new symptoms occur.

## 2013-09-30 NOTE — Patient Instructions (Signed)
Increase exercise to 5 days per week, work on diet changes, and restart Onglyza for now, but if weight and blood sugars improve - may be able to look at stopping this again in future. No other changes in medicines for now.  You should receive a call or letter about your lab results within the next week to 10 days.  Recheck in next 3 months. Return to the clinic or go to the nearest emergency room if any of your symptoms worsen or new symptoms occur.

## 2013-10-01 LAB — COMPREHENSIVE METABOLIC PANEL
Albumin: 4 g/dL (ref 3.5–5.2)
BUN: 15 mg/dL (ref 6–23)
CO2: 28 mEq/L (ref 19–32)
Glucose, Bld: 130 mg/dL — ABNORMAL HIGH (ref 70–99)
Potassium: 3.3 mEq/L — ABNORMAL LOW (ref 3.5–5.3)
Sodium: 139 mEq/L (ref 135–145)
Total Bilirubin: 0.5 mg/dL (ref 0.3–1.2)
Total Protein: 6.9 g/dL (ref 6.0–8.3)

## 2013-10-01 LAB — LIPID PANEL
Cholesterol: 157 mg/dL (ref 0–200)
Triglycerides: 101 mg/dL (ref <150)

## 2013-10-07 ENCOUNTER — Ambulatory Visit: Payer: BC Managed Care – HMO | Admitting: Family Medicine

## 2013-10-09 ENCOUNTER — Other Ambulatory Visit: Payer: Self-pay | Admitting: Family Medicine

## 2013-10-09 DIAGNOSIS — E876 Hypokalemia: Secondary | ICD-10-CM

## 2013-10-14 ENCOUNTER — Telehealth: Payer: Self-pay

## 2013-10-14 NOTE — Telephone Encounter (Signed)
Patient states that her Onglyza medication has gone from a $40.00 copay to $100.00. Patient wants a generic medication. [facility]  [phone]

## 2013-10-14 NOTE — Telephone Encounter (Signed)
If Januvia is more cost effective, can start 100mg  qd - #90, no refill. This works similarly to [employer].  If this is still too costly let me know and can look at other options.

## 2013-10-14 NOTE — Telephone Encounter (Signed)
Forwarded to Dr. Greene

## 2013-10-14 NOTE — Telephone Encounter (Signed)
Please advise (medicare so can not use coupon)

## 2013-10-15 ENCOUNTER — Telehealth: Payer: Self-pay | Admitting: Radiology

## 2013-10-15 DIAGNOSIS — E119 Type 2 diabetes mellitus without complications: Secondary | ICD-10-CM

## 2013-10-15 MED ORDER — SITAGLIPTIN PHOSPHATE 100 MG PO TABS: 100.0000 mg | ORAL_TABLET | Freq: Every day | ORAL | Status: DC

## 2013-10-15 NOTE — Telephone Encounter (Signed)
Sent in. Called her.

## 2013-10-15 NOTE — Telephone Encounter (Signed)
Patient called back about Januvia, it is more expensive than the Onglyza. It is $490, please advise.

## 2013-10-16 NOTE — Telephone Encounter (Signed)
May 2011, she was on Glipizide 10mg  bid  Onglyza added on 07/27/10 AIC 7 at around this time. Will you review her labs from her chart as well? In 2012, we were following her para thyroid hormone, and I think she may be due for additional labs in regards to this, chart in your box.

## 2013-10-16 NOTE — Telephone Encounter (Signed)
Ok. Discontinue the Onglyza. We can try a less expensive class.  I do not have access to her paper chart, but if we can look back to see if she has taken glipizide or other sulfonyurea and if she tolerated it. We could start with a low dose of 2.5mg  glipizide with first large meal of day (QD), but caution on low blood sugar symptoms, and plan of carying candy/sugar if she were to experience these symptoms (as this is more of a risk with this class of medicines). We can call in #90, no refill if she can take this. Follow up at 3 months as scheduled.

## 2013-10-17 MED ORDER — GLIPIZIDE 10 MG PO TABS: ORAL_TABLET | ORAL | Status: DC

## 2013-10-17 NOTE — Telephone Encounter (Signed)
She called back and was advised. Revised Rx sent in for her, and she will make sure she keeps [place] aware if she has any low readings or if she has any problems.

## 2013-10-17 NOTE — Telephone Encounter (Signed)
Called her / Thanks for looking at labs. Left message for her to call me back.

## 2013-10-17 NOTE — Telephone Encounter (Signed)
Disregard prior message as she is already on glipizide. Can increase dose to 15mg  (one and a half tablet) at largest meal and continue one tablet (10mg ) with other meal. Can call in extra if needed if she runs out early at this dose. Still remind of hypoglycemic precautions. Plan on recheck in January. In regards to her calcium/PTH, we have been following her calcium which was normal at last check. Let me know if any questions.

## 2014-01-13 ENCOUNTER — Ambulatory Visit (INDEPENDENT_AMBULATORY_CARE_PROVIDER_SITE_OTHER): Payer: Medicare HMO | Admitting: Family Medicine

## 2014-01-13 ENCOUNTER — Encounter: Payer: Self-pay | Admitting: Family Medicine

## 2014-01-13 VITALS — BP 138/76 | HR 74 | Temp 98.5°F | Resp 16 | Ht 64.0 in | Wt 206.0 lb

## 2014-01-13 DIAGNOSIS — I10 Essential (primary) hypertension: Secondary | ICD-10-CM

## 2014-01-13 DIAGNOSIS — E119 Type 2 diabetes mellitus without complications: Secondary | ICD-10-CM

## 2014-01-13 DIAGNOSIS — E1165 Type 2 diabetes mellitus with hyperglycemia: Secondary | ICD-10-CM

## 2014-01-13 DIAGNOSIS — IMO0002 Reserved for concepts with insufficient information to code with codable children: Secondary | ICD-10-CM

## 2014-01-13 DIAGNOSIS — IMO0001 Reserved for inherently not codable concepts without codable children: Secondary | ICD-10-CM

## 2014-01-13 LAB — GLUCOSE, POCT (MANUAL RESULT ENTRY): POC GLUCOSE: 149 mg/dL — AB (ref 70–99)

## 2014-01-13 LAB — POCT GLYCOSYLATED HEMOGLOBIN (HGB A1C): HEMOGLOBIN A1C: 8.1

## 2014-01-13 MED ORDER — GLIPIZIDE 10 MG PO TABS: 15.0000 mg | ORAL_TABLET | Freq: Two times a day (BID) | ORAL | Status: DC

## 2014-01-13 MED ORDER — PRAVASTATIN SODIUM 40 MG PO TABS: 40.0000 mg | ORAL_TABLET | Freq: Every day | ORAL | Status: DC

## 2014-01-13 MED ORDER — VALSARTAN-HYDROCHLOROTHIAZIDE 320-25 MG PO TABS: 1.0000 | ORAL_TABLET | Freq: Every day | ORAL | Status: DC

## 2014-01-13 MED ORDER — CLONIDINE HCL 0.2 MG PO TABS: 0.2000 mg | ORAL_TABLET | Freq: Two times a day (BID) | ORAL | Status: DC

## 2014-01-13 MED ORDER — GLUCOSE BLOOD VI STRP: ORAL_STRIP | Status: DC

## 2014-01-13 MED ORDER — METOPROLOL SUCCINATE ER 200 MG PO TB24: 200.0000 mg | ORAL_TABLET | Freq: Every day | ORAL | Status: DC

## 2014-01-13 MED ORDER — METFORMIN HCL 1000 MG PO TABS: 1000.0000 mg | ORAL_TABLET | Freq: Two times a day (BID) | ORAL | Status: DC

## 2014-01-13 NOTE — Patient Instructions (Signed)
Restart exercise as discussed. Increase to 1 and 1/2 of glipizide twice daily with meals. Again. Watch for low blood sugar symptoms. Recheck in 3 months - fasting then for cholesterol tests.  Return to the clinic or go to the nearest emergency room if any of your symptoms worsen or new symptoms occur.

## 2014-01-13 NOTE — Progress Notes (Signed)
Subjective:    Patient ID: Susan Miles, female    DOB: 03/16/1946, 68 y.o.   MRN: [id number]  HPI Susan Miles is a 68 y.o. female  Here for follow up.   Dm2 - see prior notes and telephone notes. Unable to afford Januvia or Onglyza, so increased glipizide to 15mg  with largest meal, 10 at other meal. stayed on metformin 1000mg  BID., on these doses, no missed doses since 10/2013. Home readings: 136-138 recently, lowest 136, highest - 198. Initially in October had higher readings off Onglyza. Walking a few days per week,  weight 206 today - overall unchanged form prior ov. No recent diet changes. A1c 7.6 on 09/30/13.  HTN- no recent med changes. No missed doses, no new side effects.   Did okay overall over the holidays - group session with hospice helped. Not feeling down/depressed recently.    Patient Active Problem List   Diagnosis Date Noted  . HTN (hypertension) 02/10/2012  . DM2 (diabetes mellitus, type 2) 02/10/2012   History reviewed. No pertinent past medical history. History reviewed. No pertinent past surgical history. Allergies  Allergen Reactions  . Ace Inhibitors Cough   Prior to Admission medications   Medication Sig Start Date End Date Taking? Authorizing Provider  aspirin 81 MG tablet Take 81 mg by mouth daily.   Yes Historical Provider, MD  cetirizine (ZYRTEC) 10 MG tablet Take 1 tablet (10 mg total) by mouth daily. 03/25/13  Yes [name], MD  cloNIDine (CATAPRES) 0.2 MG tablet Take 1 tablet (0.2 mg total) by mouth 2 (two) times daily. 09/30/13  Yes [name], MD  glipiZIDE (GLUCOTROL) 10 MG tablet 15 mg largest meal (supper) and 10 mg with meal (lunch or breakfast) 10/17/13  Yes [name], MD  glucose blood test strip Use to test blood sugars daily. Dx code 250.00. 09/30/13  Yes [name], MD  metFORMIN (GLUCOPHAGE) 1000 MG tablet Take 1 tablet (1,000 mg total) by mouth 2 (two) times daily with a meal. 09/30/13  Yes [name],  MD  metoprolol (TOPROL-XL) 200 MG 24 hr tablet Take 1 tablet (200 mg total) by mouth daily. 09/30/13  Yes [name], MD  pravastatin (PRAVACHOL) 40 MG tablet Take 1 tablet (40 mg total) by mouth daily. 09/30/13  Yes [name], MD  valsartan-hydrochlorothiazide (DIOVAN-HCT) 320-25 MG per tablet Take 1 tablet by mouth daily. 09/30/13  Yes [name], MD  cyclobenzaprine (FLEXERIL) 5 MG tablet 1 pill by mouth up to every 8 hours as needed. Start with one pill by mouth each bedtime as needed due to sedation 07/01/13   [name], MD  triamcinolone cream (KENALOG) 0.1 % Apply topically 2 (two) times daily. 09/17/12   [name], MD   History   Social History  . Marital Status: Married    Spouse Name: N/A    Number of Children: N/A  . Years of Education: N/A   Occupational History  . Not on file.   Social History Main Topics  . Smoking status: Never Smoker   . Smokeless tobacco: Never Used  . Alcohol Use: Not on file  . Drug Use: Not on file  . Sexual Activity: Not on file   Other Topics Concern  . Not on file   Social History Narrative  . No narrative on file       Review of Systems  Constitutional: Negative for fatigue and unexpected weight change.  Respiratory:  Negative for chest tightness and shortness of breath.   Cardiovascular: Negative for chest pain, palpitations and leg swelling.  Gastrointestinal: Negative for abdominal pain and blood in stool.  Neurological: Negative for dizziness, syncope, light-headedness and headaches.       Objective:   Physical Exam  Vitals reviewed. Constitutional: She is oriented to person, place, and time. She appears well-developed and well-nourished.  HENT:  Head: Normocephalic and atraumatic.  Mouth/Throat: Oropharynx is clear and moist.  Eyes: Conjunctivae and EOM are normal. Pupils are equal, round, and reactive to light.  Neck: Normal range of motion. No thyromegaly present.  Cardiovascular:  Normal rate, regular rhythm, normal heart sounds and intact distal pulses.   No murmur heard. Pulmonary/Chest: Effort normal. She has no wheezes. She has no rales.  Abdominal: Soft. Normal appearance. She exhibits no abdominal bruit and no pulsatile midline mass. There is no tenderness.  Neurological: She is alert and oriented to person, place, and time.  Microfilament testing WNL bilat feet  Skin: Skin is warm and dry.  Psychiatric: She has a normal mood and affect. Her behavior is normal.   Filed Vitals:   01/13/14 1403  BP: 138/76  Pulse: 74  Temp: 98.5 F (36.9 C)  Resp: 16  Height: 5\' 4"  (1.626 m)  Weight: 206 lb (93.441 kg)  SpO2: 99%    Results for orders placed in visit on 01/13/14  GLUCOSE, POCT (MANUAL RESULT ENTRY)      Result Value Range   POC Glucose 149 (*) 70 - 99 mg/dl  POCT GLYCOSYLATED HEMOGLOBIN (HGB A1C)      Result Value Range   Hemoglobin A1C 8.1         Assessment & Plan:   Susan Miles is a 68 y.o. female DM2 (diabetes mellitus, type 2) - Plan: POCT glucose (manual entry), POCT glycosylated hemoglobin (Hb A1C), pravastatin (PRAVACHOL) 40 MG tablet, metFORMIN (GLUCOPHAGE) 1000 MG tablet, glipiZIDE (GLUCOTROL) 10 MG tablet, DISCONTINUED: glipiZIDE (GLUCOTROL) 10 MG tablet  -still uncontrolled and slight increase in A1c. Will try to increase glipizide slightly again to total 15mg  BID, no other med changes, but to work on activity/exercise. Recheck in 3 months., hypoglycemia precautions again reviewed.   HTN (hypertension) - Plan: valsartan-hydrochlorothiazide (DIOVAN-HCT) 320-25 MG per tablet, metoprolol (TOPROL-XL) 200 MG 24 hr tablet, cloNIDine (CATAPRES) 0.2 MG tablet   - stable, no orthostatic sx's or new med SE's.  meds refilled.    Meds ordered this encounter  Medications  . valsartan-hydrochlorothiazide (DIOVAN-HCT) 320-25 MG per tablet    Sig: Take 1 tablet by mouth daily.    Dispense:  90 tablet    Refill:  1  . pravastatin (PRAVACHOL) 40  MG tablet    Sig: Take 1 tablet (40 mg total) by mouth daily.    Dispense:  90 tablet    Refill:  1  . metoprolol (TOPROL-XL) 200 MG 24 hr tablet    Sig: Take 1 tablet (200 mg total) by mouth daily.    Dispense:  90 tablet    Refill:  1  . metFORMIN (GLUCOPHAGE) 1000 MG tablet    Sig: Take 1 tablet (1,000 mg total) by mouth 2 (two) times daily with a meal.    Dispense:  180 tablet    Refill:  1  . glucose blood test strip    Sig: Use to test blood sugars daily. Dx code 250.00.    Dispense:  100 each    Refill:  12    Dx code  250.00 to check blood sugars daily. Test strips for One Touch Ultra Mini.    Order Specific Question:  Supervising Provider    Answer:  Wardell Honour [[id number]]  . DISCONTD: glipiZIDE (GLUCOTROL) 10 MG tablet    Sig: Take 1.5 tablets (15 mg total) by mouth 2 (two) times daily before a meal. 15 mg largest meal (supper) and 10 mg with meal (lunch or breakfast)    Dispense:  270 tablet    Refill:  1  . cloNIDine (CATAPRES) 0.2 MG tablet    Sig: Take 1 tablet (0.2 mg total) by mouth 2 (two) times daily.    Dispense:  180 tablet    Refill:  1  . glipiZIDE (GLUCOTROL) 10 MG tablet    Sig: Take 1.5 tablets (15 mg total) by mouth 2 (two) times daily before a meal.    Dispense:  270 tablet    Refill:  1   Patient Instructions  Restart exercise as discussed. Increase to 1 and 1/2 of glipizide twice daily with meals. Again. Watch for low blood sugar symptoms. Recheck in 3 months - fasting then for cholesterol tests.  Return to the clinic or go to the nearest emergency room if any of your symptoms worsen or new symptoms occur.

## 2014-03-12 ENCOUNTER — Other Ambulatory Visit: Payer: Self-pay | Admitting: Family Medicine

## 2014-03-12 DIAGNOSIS — Z1231 Encounter for screening mammogram for malignant neoplasm of breast: Secondary | ICD-10-CM

## 2014-04-14 ENCOUNTER — Encounter: Payer: Self-pay | Admitting: Family Medicine

## 2014-04-14 ENCOUNTER — Telehealth: Payer: Self-pay

## 2014-04-14 ENCOUNTER — Ambulatory Visit (INDEPENDENT_AMBULATORY_CARE_PROVIDER_SITE_OTHER): Payer: Medicare HMO | Admitting: Family Medicine

## 2014-04-14 VITALS — BP 134/84 | HR 79 | Temp 98.2°F | Resp 16 | Ht 64.0 in | Wt 206.6 lb

## 2014-04-14 DIAGNOSIS — M79672 Pain in left foot: Secondary | ICD-10-CM

## 2014-04-14 DIAGNOSIS — E1165 Type 2 diabetes mellitus with hyperglycemia: Secondary | ICD-10-CM

## 2014-04-14 DIAGNOSIS — E119 Type 2 diabetes mellitus without complications: Secondary | ICD-10-CM

## 2014-04-14 DIAGNOSIS — IMO0001 Reserved for inherently not codable concepts without codable children: Secondary | ICD-10-CM

## 2014-04-14 DIAGNOSIS — IMO0002 Reserved for concepts with insufficient information to code with codable children: Secondary | ICD-10-CM

## 2014-04-14 DIAGNOSIS — M79609 Pain in unspecified limb: Secondary | ICD-10-CM

## 2014-04-14 DIAGNOSIS — I10 Essential (primary) hypertension: Secondary | ICD-10-CM

## 2014-04-14 LAB — COMPLETE METABOLIC PANEL WITH GFR
ALK PHOS: 56 U/L (ref 39–117)
ALT: 21 U/L (ref 0–35)
AST: 17 U/L (ref 0–37)
Albumin: 4.1 g/dL (ref 3.5–5.2)
BILIRUBIN TOTAL: 0.6 mg/dL (ref 0.2–1.2)
BUN: 15 mg/dL (ref 6–23)
CO2: 27 mEq/L (ref 19–32)
CREATININE: 0.97 mg/dL (ref 0.50–1.10)
Calcium: 10.7 mg/dL — ABNORMAL HIGH (ref 8.4–10.5)
Chloride: 97 mEq/L (ref 96–112)
GFR, Est African American: 70 mL/min
GFR, Est Non African American: 61 mL/min
Glucose, Bld: 162 mg/dL — ABNORMAL HIGH (ref 70–99)
Potassium: 3.5 mEq/L (ref 3.5–5.3)
Sodium: 135 mEq/L (ref 135–145)
Total Protein: 7.2 g/dL (ref 6.0–8.3)

## 2014-04-14 LAB — LIPID PANEL
CHOL/HDL RATIO: 4.1 ratio
Cholesterol: 148 mg/dL (ref 0–200)
HDL: 36 mg/dL — AB (ref >39)
LDL CALC: 90 mg/dL (ref 0–99)
TRIGLYCERIDES: 109 mg/dL (ref <150)
VLDL: 22 mg/dL (ref 0–40)

## 2014-04-14 LAB — POCT GLYCOSYLATED HEMOGLOBIN (HGB A1C): HEMOGLOBIN A1C: 8.9

## 2014-04-14 LAB — GLUCOSE, POCT (MANUAL RESULT ENTRY): POC Glucose: 176 mg/dl — AB (ref 70–99)

## 2014-04-14 MED ORDER — PRAVASTATIN SODIUM 40 MG PO TABS: 40.0000 mg | ORAL_TABLET | Freq: Every day | ORAL | Status: DC

## 2014-04-14 MED ORDER — VALSARTAN-HYDROCHLOROTHIAZIDE 320-25 MG PO TABS: 1.0000 | ORAL_TABLET | Freq: Every day | ORAL | Status: DC

## 2014-04-14 MED ORDER — CLONIDINE HCL 0.2 MG PO TABS: 0.2000 mg | ORAL_TABLET | Freq: Two times a day (BID) | ORAL | Status: DC

## 2014-04-14 MED ORDER — METOPROLOL SUCCINATE ER 200 MG PO TB24: 200.0000 mg | ORAL_TABLET | Freq: Every day | ORAL | Status: DC

## 2014-04-14 MED ORDER — METFORMIN HCL 1000 MG PO TABS: 1000.0000 mg | ORAL_TABLET | Freq: Two times a day (BID) | ORAL | Status: DC

## 2014-04-14 MED ORDER — GLIPIZIDE 10 MG PO TABS: 15.0000 mg | ORAL_TABLET | Freq: Two times a day (BID) | ORAL | Status: DC

## 2014-04-14 MED ORDER — SAXAGLIPTIN HCL 2.5 MG PO TABS: 2.5000 mg | ORAL_TABLET | Freq: Every day | ORAL | Status: DC

## 2014-04-14 NOTE — Telephone Encounter (Signed)
Dr Carlota Raspberry   saxagliptin HCl (ONGLYZA) 2.5 MG TABS tablet   Patient states this medication if over $200 and would like something less expensive.   [facility]   [phone]

## 2014-04-14 NOTE — Progress Notes (Signed)
This chart was scribed for [name], MD by Marcha Dutton, ED Scribe. This patient was seen in room 21 and the patient's care was started at 2:14 PM.  Subjective:    Patient ID: Susan Miles, female    DOB: 11-17-1946, 68 y.o.   MRN: [id number]  Chief Complaint  Patient presents with  . Follow-up    diabetes and refills-catapres, glipizide, metformin, toprol XL,pravachol, and diovan-hct    HPI ID: Susan Miles is a 68 y.o. female  PCP: No primary provider on file.  Here for DM f/u and med refills. Pt last seen 3 months ago for her DM. Prior uncontrolled DM, and due to cost of Januvia and Onglyza, she was unable to start. Increased to 15 mg BID  At last office visit, and continued on metformin 1000 mg BID. Last A1C 8.1 2.08/2014. Last lipid panel 03/2013 LDL 105, She takes Pravachol 40 mg QD.  Pt's HTN was controlled at last office visit no med changes. Kidney fxn in 09/2013 with creatinine 0.93. She was noted to be slightly hypokalemic at 3.3 at last office visit. Has not checked since.  DM: Pt reports her sugars haven't been well around 119 lowest and mid 200s in the highest. She states he hasn't been exercising for the last 6 weeks. She reports stopping her exercising because her left foot became swollen and puffy. She states she felt pain along the bottom of her foot and then the veins "puffed up." She reports the top of the foot was sore. Pt denies calf pain, chest pain, or difficulty breathing. She states it felt a little warm. Pt reports it is no longer sore but does still swell. Weight is the same as last office visit at 206 lbs.  Prior difficulty affording onglyza with different insurance.  Now may be able to afford Onglyza - tolerated this ok prior.   HTN: Pt takes her BP at home daily. She self reports 117/78 this morning. Pt denies lightheadedness and dizziness. She states she hasn't eaten today.  Last dentist appointment 4 days ago. Last eye doctor appointment was  in 01/2014. She states she has a mammogram in the coming week.   Patient Active Problem List   Diagnosis Date Noted  . HTN (hypertension) 02/10/2012  . DM2 (diabetes mellitus, type 2) 02/10/2012    No past medical history on file. No past surgical history on file. Allergies  Allergen Reactions  . Ace Inhibitors Cough    Prior to Admission medications   Medication Sig Start Date End Date Taking? Authorizing Provider  aspirin 81 MG tablet Take 81 mg by mouth daily.   Yes Historical Provider, MD  cetirizine (ZYRTEC) 10 MG tablet Take 1 tablet (10 mg total) by mouth daily. 03/25/13  Yes [name], MD  cloNIDine (CATAPRES) 0.2 MG tablet Take 1 tablet (0.2 mg total) by mouth 2 (two) times daily. 01/13/14  Yes [name], MD  cyclobenzaprine (FLEXERIL) 5 MG tablet 1 pill by mouth up to every 8 hours as needed. Start with one pill by mouth each bedtime as needed due to sedation 07/01/13  Yes [name], MD  glipiZIDE (GLUCOTROL) 10 MG tablet Take 1.5 tablets (15 mg total) by mouth 2 (two) times daily before a meal. 01/13/14  Yes [name], MD  glucose blood test strip Use to test blood sugars daily. Dx code 250.00. 01/13/14  Yes [name], MD  metFORMIN (GLUCOPHAGE) 1000 MG tablet Take  1 tablet (1,000 mg total) by mouth 2 (two) times daily with a meal. 01/13/14  Yes [name], MD  metoprolol (TOPROL-XL) 200 MG 24 hr tablet Take 1 tablet (200 mg total) by mouth daily. 01/13/14  Yes [name], MD  pravastatin (PRAVACHOL) 40 MG tablet Take 1 tablet (40 mg total) by mouth daily. 01/13/14  Yes [name], MD  valsartan-hydrochlorothiazide (DIOVAN-HCT) 320-25 MG per tablet Take 1 tablet by mouth daily. 01/13/14  Yes [name], MD  triamcinolone cream (KENALOG) 0.1 % Apply topically 2 (two) times daily. 09/17/12   [name], MD    History   Social History  . Marital Status: Married    Spouse Name: N/A    Number of Children: N/A  . Years of  Education: N/A   Occupational History  . Not on file.   Social History Main Topics  . Smoking status: Never Smoker   . Smokeless tobacco: Never Used  . Alcohol Use: Not on file  . Drug Use: Not on file  . Sexual Activity: Not on file   Other Topics Concern  . Not on file   Social History Narrative  . No narrative on file    Review of Systems  Constitutional: Negative for fatigue and unexpected weight change.  HENT: Negative for congestion.   Respiratory: Negative for chest tightness and shortness of breath.   Cardiovascular: Negative for chest pain, palpitations and leg swelling.  Gastrointestinal: Negative for nausea, vomiting, abdominal pain, diarrhea, constipation and blood in stool.  Neurological: Negative for dizziness, syncope, light-headedness and headaches.       Objective:   Physical Exam  Vitals reviewed. Constitutional: She is oriented to person, place, and time. She appears well-developed and well-nourished.  HENT:  Head: Normocephalic and atraumatic.  Eyes: Conjunctivae and EOM are normal. Pupils are equal, round, and reactive to light.  Neck: Carotid bruit is not present.  Cardiovascular: Normal rate, regular rhythm and intact distal pulses.  Exam reveals no gallop and no friction rub.   No murmur heard. Pulmonary/Chest: Effort normal and breath sounds normal. No respiratory distress. She has no wheezes. She has no rales.  Abdominal: Soft. She exhibits no pulsatile midline mass. There is no tenderness.  Musculoskeletal: Normal range of motion.       Left ankle: Tenderness.  Minimal tenderness over the dorsal foot w/o apparent nodules or redness, full ROM calf is non tender and no edema  Neurological: She is alert and oriented to person, place, and time.  Skin: Skin is warm and dry.  Psychiatric: She has a normal mood and affect. Her behavior is normal.    Triage Vitals: BP 134/84  Pulse 79  Temp(Src) 98.2 F (36.8 C) (Oral)  Resp 16  Ht 5\' 4"  (1.626  m)  Wt 206 lb 9.6 oz (93.713 kg)  BMI 35.45 kg/m2  SpO2 99%  Results for orders placed in visit on 04/14/14  GLUCOSE, POCT (MANUAL RESULT ENTRY)      Result Value Ref Range   POC Glucose 176 (*) 70 - 99 mg/dl  POCT GLYCOSYLATED HEMOGLOBIN (HGB A1C)      Result Value Ref Range   Hemoglobin A1C 8.9         Assessment & Plan:   DARYAN CAGLEY is a 68 y.o. female HTN (hypertension) - Plan: cloNIDine (CATAPRES) 0.2 MG tablet, metoprolol (TOPROL-XL) 200 MG 24 hr tablet, valsartan-hydrochlorothiazide (DIOVAN-HCT) 320-25 MG per tablet, COMPLETE METABOLIC PANEL WITH GFR, Lipid panel  -  stable, no med changes. Labs pending.   DM2 (diabetes mellitus, type 2) - Plan: pravastatin (PRAVACHOL) 40 MG tablet, POCT glucose (manual entry), POCT glycosylated hemoglobin (Hb A1C), COMPLETE METABOLIC PANEL WITH GFR, Lipid panel, glipiZIDE (GLUCOTROL) 10 MG tablet, metFORMIN (GLUCOPHAGE) 1000 MG tablet  -uncontrolled. Added back Onglyza 2.5mg  as may have coverage now. Importance of exercise and diet reinforced.  Continue same doses of metformin and glipizide. Hypoglycemic precautions reviewed.  Recheck in 3 months.   Left foot pain - Plan: [facility] Diabetes Foot Exam  - strain/overuse vs. prior phlebitis - now improved. H/o and treatment of phlebitis. rtc precautions if not continuing to improve or any proximal symptoms.    Meds ordered this encounter  Medications  . cloNIDine (CATAPRES) 0.2 MG tablet    Sig: Take 1 tablet (0.2 mg total) by mouth 2 (two) times daily.    Dispense:  180 tablet    Refill:  1  . metoprolol (TOPROL-XL) 200 MG 24 hr tablet    Sig: Take 1 tablet (200 mg total) by mouth daily.    Dispense:  90 tablet    Refill:  1  . pravastatin (PRAVACHOL) 40 MG tablet    Sig: Take 1 tablet (40 mg total) by mouth daily.    Dispense:  90 tablet    Refill:  1  . valsartan-hydrochlorothiazide (DIOVAN-HCT) 320-25 MG per tablet    Sig: Take 1 tablet by mouth daily.    Dispense:  90 tablet     Refill:  1  . glipiZIDE (GLUCOTROL) 10 MG tablet    Sig: Take 1.5 tablets (15 mg total) by mouth 2 (two) times daily before a meal.    Dispense:  270 tablet    Refill:  1  . saxagliptin HCl (ONGLYZA) 2.5 MG TABS tablet    Sig: Take 1 tablet (2.5 mg total) by mouth daily.    Dispense:  30 tablet    Refill:  3  . metFORMIN (GLUCOPHAGE) 1000 MG tablet    Sig: Take 1 tablet (1,000 mg total) by mouth 2 (two) times daily with a meal.    Dispense:  180 tablet    Refill:  1   Patient Instructions  As your foot pain is improving, no new medications for now.  Heat over area if needed. This may have been "superficial phlebitis" - see below. If this does not continue to improve, or worsens - return for recheck.  You should receive a call or letter about your lab results within the next week to 10 days.   You can continue same dose of metformin and glipizide, add onglyza (call if this is not covered by insurance).  Work on exercise and diet as discussed and recheck in 3 months. Over time, we may need to look at changing medicines, but just the one change today.   Return to the clinic or go to the nearest emergency room if any of your symptoms worsen or new symptoms occur.   Phlebitis Phlebitis is soreness and swelling (inflammation) of a vein. This can occur in your arms, legs, or torso (trunk), as well as deeper inside your body. Phlebitis is usually not serious when it occurs close to the surface of the body. However, it can cause serious problems when it occurs in a vein deeper inside the body. CAUSES  Phlebitis can be triggered by various things, including:   Reduced blood flow through your veins. This can happen with:  Bed rest over a long period.  Long-distance  travel.  Injury.  Surgery.  Being overweight (obese) or pregnant.  Having an IV tube put in the vein and getting certain medicines through the vein.  Cancer and cancer treatment.  Use of illegal drugs taken through the  vein.  Inflammatory diseases.  Inherited (genetic) diseases that increase the risk of blood clots.  Hormone therapy, such as birth control pills. SIGNS AND SYMPTOMS   Red, tender, swollen, and painful area on your skin. Usually, the area will be long and narrow.  Firmness along the center of the affected area. This can indicate that a blood clot has formed.  Low-grade fever. DIAGNOSIS  A health care provider can usually diagnose phlebitis by examining the affected area and asking about your symptoms. To check for infection or blood clots, your health care provider may order blood tests or an ultrasound exam of the area. Blood tests and your family history may also indicate if you have an underlying genetic disease that causes blood clots. Occasionally, a piece of tissue is taken from the body (biopsy sample) if an unusual cause of phlebitis is suspected. TREATMENT  Treatment will vary depending on the severity of the condition and the area of the body affected. Treatment may include:  Use of a warm compress or heating pad.  Use of compression stockings or bandages.  Anti-inflammatory medicines.  Removal of any IV tube that may be causing the problem.  Medicines that kill germs (antibiotics) if an infection is present.  Blood-thinning medicines if a blood clot is suspected or present.  In rare cases, surgery may be needed to remove damaged sections of vein. HOME CARE INSTRUCTIONS   Only take over-the-counter or prescription medicines as directed by your health care provider. Take all medicines exactly as prescribed.  Raise (elevate) the affected area above the level of your heart as directed by your health care provider.  Apply a warm compress or heating pad to the affected area as directed by your health care provider. Do not sleep with the heating pad.  Use compression stockings or bandages as directed. These will speed healing and prevent the condition from coming  back.  If you are on blood thinners:  Get follow-up blood tests as directed by your health care provider.  Check with your health care provider before using any new medicines.  Carry a medical alert card or wear your medical alert jewelry to show that you are on blood thinners.  For phlebitis in the legs:  Avoid prolonged standing or bed rest.  Keep your legs moving. Raise your legs when sitting or lying.  Do not smoke.  Women, particularly those over the age of 6, should consider the risks and benefits of taking the contraceptive pill. This kind of hormone treatment can increase your risk for blood clots.  Follow up with your health care provider as directed. SEEK MEDICAL CARE IF:   You have unusual bruising or any bleeding problems.  Your swelling or pain in the affected area is not improving.  You are on anti-inflammatory medicine, and you develop belly (abdominal) pain. SEEK IMMEDIATE MEDICAL CARE IF:   You have a sudden onset of chest pain or difficulty breathing.  You have a fever or persistent symptoms for more than 2 3 days.  You have a fever and your symptoms suddenly get worse. MAKE SURE YOU:  Understand these instructions.  Will watch your condition.  Will get help right away if you are not doing well or get worse. Document Released: 11/15/2001  Document Revised: 09/11/2013 Document Reviewed: 07/29/2013 [facility] Patient Information 2014 [place].    I personally performed the services described in this documentation, which was scribed in my presence. The recorded information has been reviewed and considered, and addended by me as needed.

## 2014-04-14 NOTE — Patient Instructions (Addendum)
As your foot pain is improving, no new medications for now.  Heat over area if needed. This may have been "superficial phlebitis" - see below. If this does not continue to improve, or worsens - return for recheck.  You should receive a call or letter about your lab results within the next week to 10 days.   You can continue same dose of metformin and glipizide, add onglyza (call if this is not covered by insurance).  Work on exercise and diet as discussed and recheck in 3 months. Over time, we may need to look at changing medicines, but just the one change today.   Return to the clinic or go to the nearest emergency room if any of your symptoms worsen or new symptoms occur.   Phlebitis Phlebitis is soreness and swelling (inflammation) of a vein. This can occur in your arms, legs, or torso (trunk), as well as deeper inside your body. Phlebitis is usually not serious when it occurs close to the surface of the body. However, it can cause serious problems when it occurs in a vein deeper inside the body. CAUSES  Phlebitis can be triggered by various things, including:   Reduced blood flow through your veins. This can happen with:  Bed rest over a long period.  Long-distance travel.  Injury.  Surgery.  Being overweight (obese) or pregnant.  Having an IV tube put in the vein and getting certain medicines through the vein.  Cancer and cancer treatment.  Use of illegal drugs taken through the vein.  Inflammatory diseases.  Inherited (genetic) diseases that increase the risk of blood clots.  Hormone therapy, such as birth control pills. SIGNS AND SYMPTOMS   Red, tender, swollen, and painful area on your skin. Usually, the area will be long and narrow.  Firmness along the center of the affected area. This can indicate that a blood clot has formed.  Low-grade fever. DIAGNOSIS  A health care provider can usually diagnose phlebitis by examining the affected area and asking about your  symptoms. To check for infection or blood clots, your health care provider may order blood tests or an ultrasound exam of the area. Blood tests and your family history may also indicate if you have an underlying genetic disease that causes blood clots. Occasionally, a piece of tissue is taken from the body (biopsy sample) if an unusual cause of phlebitis is suspected. TREATMENT  Treatment will vary depending on the severity of the condition and the area of the body affected. Treatment may include:  Use of a warm compress or heating pad.  Use of compression stockings or bandages.  Anti-inflammatory medicines.  Removal of any IV tube that may be causing the problem.  Medicines that kill germs (antibiotics) if an infection is present.  Blood-thinning medicines if a blood clot is suspected or present.  In rare cases, surgery may be needed to remove damaged sections of vein. HOME CARE INSTRUCTIONS   Only take over-the-counter or prescription medicines as directed by your health care provider. Take all medicines exactly as prescribed.  Raise (elevate) the affected area above the level of your heart as directed by your health care provider.  Apply a warm compress or heating pad to the affected area as directed by your health care provider. Do not sleep with the heating pad.  Use compression stockings or bandages as directed. These will speed healing and prevent the condition from coming back.  If you are on blood thinners:  Get follow-up blood tests  as directed by your health care provider.  Check with your health care provider before using any new medicines.  Carry a medical alert card or wear your medical alert jewelry to show that you are on blood thinners.  For phlebitis in the legs:  Avoid prolonged standing or bed rest.  Keep your legs moving. Raise your legs when sitting or lying.  Do not smoke.  Women, particularly those over the age of 73, should consider the risks and  benefits of taking the contraceptive pill. This kind of hormone treatment can increase your risk for blood clots.  Follow up with your health care provider as directed. SEEK MEDICAL CARE IF:   You have unusual bruising or any bleeding problems.  Your swelling or pain in the affected area is not improving.  You are on anti-inflammatory medicine, and you develop belly (abdominal) pain. SEEK IMMEDIATE MEDICAL CARE IF:   You have a sudden onset of chest pain or difficulty breathing.  You have a fever or persistent symptoms for more than 2 3 days.  You have a fever and your symptoms suddenly get worse. MAKE SURE YOU:  Understand these instructions.  Will watch your condition.  Will get help right away if you are not doing well or get worse. Document Released: 11/15/2001 Document Revised: 09/11/2013 Document Reviewed: 07/29/2013 [facility] Patient Information 2014 [place].

## 2014-04-15 NOTE — Telephone Encounter (Signed)
Spoke to patient and advised her to call her insurance company to find a cheaper medication that they would suggest for her.  She said she would do so.

## 2014-04-15 NOTE — Telephone Encounter (Signed)
Called pharmacy said that she would have to call the insurance company to find out what medication is covered at a cheaper price.    Called patient.  No answer.  LMVM to CB.

## 2014-04-15 NOTE — Telephone Encounter (Signed)
Patient indicates [name] is a $45 copay and Tradjenta is also a $45 copay, either of these will be more cost effective for her.

## 2014-04-15 NOTE — Telephone Encounter (Signed)
Discussed at ov that if this was not covered, or too expensive, to have pharmacist look at other similar meds for less expensive option.  Can we please call pharmacy and inquire about this?  Thanks. -jg

## 2014-04-20 MED ORDER — SITAGLIPTIN PHOSPHATE 50 MG PO TABS: 50.0000 mg | ORAL_TABLET | Freq: Every day | ORAL | Status: DC

## 2014-04-20 NOTE — Telephone Encounter (Signed)
Januvia 50mg  QD to start - sent in 1 month, 3 rf's. Over next 4-6 weeks, if sugars still running high and no lows (precautions discussed last ov with adding another med) may be increasing this to 100mg  qd, but can call in with blood sugar readings at that time to decide.  Let me know if she has questions. Recheck OV in 3 months.

## 2014-04-21 NOTE — Telephone Encounter (Signed)
LMVM to CB. 

## 2014-04-22 NOTE — Telephone Encounter (Signed)
Pt.notified

## 2014-04-29 ENCOUNTER — Ambulatory Visit (HOSPITAL_COMMUNITY)
Admission: RE | Admit: 2014-04-29 | Discharge: 2014-04-29 | Disposition: A | Discharge status: Home / Self Care | Payer: Medicare HMO | Source: Ambulatory Visit | Attending: Family Medicine | Admitting: Family Medicine

## 2014-04-29 DIAGNOSIS — Z1231 Encounter for screening mammogram for malignant neoplasm of breast: Secondary | ICD-10-CM | POA: Insufficient documentation

## 2014-07-09 ENCOUNTER — Other Ambulatory Visit: Payer: Self-pay | Admitting: Family Medicine

## 2014-07-28 ENCOUNTER — Ambulatory Visit (INDEPENDENT_AMBULATORY_CARE_PROVIDER_SITE_OTHER): Payer: Medicare HMO | Admitting: Family Medicine

## 2014-07-28 ENCOUNTER — Encounter: Payer: Self-pay | Admitting: Family Medicine

## 2014-07-28 VITALS — BP 138/78 | HR 74 | Temp 98.2°F | Resp 16 | Ht 63.5 in | Wt 203.6 lb

## 2014-07-28 DIAGNOSIS — M5431 Sciatica, right side: Secondary | ICD-10-CM

## 2014-07-28 DIAGNOSIS — Z23 Encounter for immunization: Secondary | ICD-10-CM

## 2014-07-28 DIAGNOSIS — E1165 Type 2 diabetes mellitus with hyperglycemia: Secondary | ICD-10-CM

## 2014-07-28 DIAGNOSIS — M25579 Pain in unspecified ankle and joints of unspecified foot: Secondary | ICD-10-CM

## 2014-07-28 DIAGNOSIS — M5430 Sciatica, unspecified side: Secondary | ICD-10-CM

## 2014-07-28 DIAGNOSIS — M25572 Pain in left ankle and joints of left foot: Secondary | ICD-10-CM

## 2014-07-28 DIAGNOSIS — IMO0001 Reserved for inherently not codable concepts without codable children: Secondary | ICD-10-CM

## 2014-07-28 DIAGNOSIS — IMO0002 Reserved for concepts with insufficient information to code with codable children: Secondary | ICD-10-CM

## 2014-07-28 DIAGNOSIS — E119 Type 2 diabetes mellitus without complications: Secondary | ICD-10-CM

## 2014-07-28 DIAGNOSIS — I10 Essential (primary) hypertension: Secondary | ICD-10-CM

## 2014-07-28 LAB — POCT GLYCOSYLATED HEMOGLOBIN (HGB A1C): Hemoglobin A1C: 7.3

## 2014-07-28 LAB — GLUCOSE, POCT (MANUAL RESULT ENTRY): POC Glucose: 102 mg/dl — AB (ref 70–99)

## 2014-07-28 MED ORDER — SITAGLIPTIN PHOSPHATE 100 MG PO TABS: 100.0000 mg | ORAL_TABLET | Freq: Every day | ORAL | Status: DC

## 2014-07-28 MED ORDER — CLONIDINE HCL 0.2 MG PO TABS: 0.2000 mg | ORAL_TABLET | Freq: Two times a day (BID) | ORAL | Status: DC

## 2014-07-28 MED ORDER — VALSARTAN-HYDROCHLOROTHIAZIDE 320-25 MG PO TABS: 1.0000 | ORAL_TABLET | Freq: Every day | ORAL | Status: DC

## 2014-07-28 MED ORDER — METOPROLOL SUCCINATE ER 200 MG PO TB24: 200.0000 mg | ORAL_TABLET | Freq: Every day | ORAL | Status: DC

## 2014-07-28 MED ORDER — METFORMIN HCL 1000 MG PO TABS: 1000.0000 mg | ORAL_TABLET | Freq: Two times a day (BID) | ORAL | Status: DC

## 2014-07-28 MED ORDER — GLIPIZIDE 10 MG PO TABS: 15.0000 mg | ORAL_TABLET | Freq: Two times a day (BID) | ORAL | Status: DC

## 2014-07-28 MED ORDER — PRAVASTATIN SODIUM 40 MG PO TABS
40.0000 mg | ORAL_TABLET | Freq: Every day | ORAL | Status: DC
Start: 2014-07-28 — End: 2015-09-21

## 2014-07-28 NOTE — Patient Instructions (Signed)
Your diabetes control is closer to goal. Continue exercise, diet cahnges, we can increase Januvia to 100mg  each day, but watch for any low blood sugars.  See information below.  If this occurs -call me and we will decrease the strength of your medicines. Return to the clinic or go to the nearest emergency room if any of your symptoms worsen or new symptoms occur.  Return in 3 months  If ankle pain or swelling increases - recheck, but I suspect this is overuse or sprain with new exercises.  Return to the clinic or go to the nearest emergency room if any of your symptoms worsen or new symptoms occur.   Hypoglycemia Hypoglycemia occurs when the glucose in your blood is too low. Glucose is a type of sugar that is your body's main energy source. Hormones, such as insulin and glucagon, control the level of glucose in the blood. Insulin lowers blood glucose and glucagon increases blood glucose. Having too much insulin in your blood stream, or not eating enough food containing sugar, can result in hypoglycemia. Hypoglycemia can happen to people with or without diabetes. It can develop quickly and can be a medical emergency.  CAUSES   Missing or delaying meals.  Not eating enough carbohydrates at meals.  Taking too much diabetes medicine.  Not timing your oral diabetes medicine or insulin doses with meals, snacks, and exercise.  Nausea and vomiting.  Certain medicines.  Severe illnesses, such as hepatitis, kidney disorders, and certain eating disorders.  Increased activity or exercise without eating something extra or adjusting medicines.  Drinking too much alcohol.  A nerve disorder that affects body functions like your heart rate, blood pressure, and digestion (autonomic neuropathy).  A condition where the stomach muscles do not function properly (gastroparesis). Therefore, medicines and food may not absorb properly.  Rarely, a tumor of the pancreas can produce too much insulin. SYMPTOMS    Hunger.  Sweating (diaphoresis).  Change in body temperature.  Shakiness.  Headache.  Anxiety.  Lightheadedness.  Irritability.  Difficulty concentrating.  Dry mouth.  Tingling or numbness in the hands or feet.  Restless sleep or sleep disturbances.  Altered speech and coordination.  Change in mental status.  Seizures or prolonged convulsions.  Combativeness.  Drowsiness (lethargic).  Weakness.  Increased heart rate or palpitations.  Confusion.  Pale, gray skin color.  Blurred or double vision.  Fainting. DIAGNOSIS  A physical exam and medical history will be performed. Your caregiver may make a diagnosis based on your symptoms. Blood tests and other lab tests may be performed to confirm a diagnosis. Once the diagnosis is made, your caregiver will see if your signs and symptoms go away once your blood glucose is raised.  TREATMENT  Usually, you can easily treat your hypoglycemia when you notice symptoms.  Check your blood glucose. If it is less than 70 mg/dl, take one of the following:   3-4 glucose tablets.    cup juice.    cup regular soda.   1 cup skim milk.   -1 tube of glucose gel.   5-6 hard candies.   Avoid high-fat drinks or food that may delay a rise in blood glucose levels.  Do not take more than the recommended amount of sugary foods, drinks, gel, or tablets. Doing so will cause your blood glucose to go too high.   Wait 10-15 minutes and recheck your blood glucose. If it is still less than 70 mg/dl or below your target range, repeat treatment.  Eat a snack if it is more than 1 hour until your next meal.  There may be a time when your blood glucose may go so low that you are unable to treat yourself at home when you start to notice symptoms. You may need someone to help you. You may even faint or be unable to swallow. If you cannot treat yourself, someone will need to bring you to the hospital.  [place]  If you have diabetes, follow your diabetes management plan by:  Taking your medicines as directed.  Following your exercise plan.  Following your meal plan. Do not skip meals. Eat on time.  Testing your blood glucose regularly. Check your blood glucose before and after exercise. If you exercise longer or different than usual, be sure to check blood glucose more frequently.  Wearing your medical alert jewelry that says you have diabetes.  Identify the cause of your hypoglycemia. Then, develop ways to prevent the recurrence of hypoglycemia.  Do not take a hot bath or shower right after an insulin shot.  Always carry treatment with you. Glucose tablets are the easiest to carry.  If you are going to drink alcohol, drink it only with meals.  Tell friends or family members ways to keep you safe during a seizure. This may include removing hard or sharp objects from the area or turning you on your side.  Maintain a healthy weight. SEEK MEDICAL CARE IF:   You are having problems keeping your blood glucose in your target range.  You are having frequent episodes of hypoglycemia.  You feel you might be having side effects from your medicines.  You are not sure why your blood glucose is dropping so low.  You notice a change in vision or a new problem with your vision. SEEK IMMEDIATE MEDICAL CARE IF:   Confusion develops.  A change in mental status occurs.  The inability to swallow develops.  Fainting occurs. Document Released: 11/21/2005 Document Revised: 11/26/2013 Document Reviewed: 03/19/2012 [facility] Patient Information 2015 [place], [place]. This information is not intended to replace advice given to you by your health care provider. Make sure you discuss any questions you have with your health care provider.

## 2014-07-28 NOTE — Progress Notes (Signed)
Subjective:    Patient ID: Susan Miles, female    DOB: January 25, 1946, 68 y.o.   MRN: [id number]  HPI Susan Miles is a 68 y.o. female Here for follow up.   HTN - clonidine 0.2mg  BID, toprol XL 200mg  QD, diovan HCT 320/25mg  qd. No new side effects. Home BP's - not recently as machine broken. Prior had been doing good around 128-132/80's. No CP/dizziness, HA.  Results for orders placed in visit on 04/14/14  COMPLETE METABOLIC PANEL WITH GFR      Result Value Ref Range   Sodium 135  135 - 145 mEq/L   Potassium 3.5  3.5 - 5.3 mEq/L   Chloride 97  96 - 112 mEq/L   CO2 27  19 - 32 mEq/L   Glucose, Bld 162 (*) 70 - 99 mg/dL   BUN 15  6 - 23 mg/dL   Creat 0.97  0.50 - 1.10 mg/dL   Total Bilirubin 0.6  0.2 - 1.2 mg/dL   Alkaline Phosphatase 56  39 - 117 U/L   AST 17  0 - 37 U/L   ALT 21  0 - 35 U/L   Total Protein 7.2  6.0 - 8.3 g/dL   Albumin 4.1  3.5 - 5.2 g/dL   Calcium 10.7 (*) 8.4 - 10.5 mg/dL   GFR, Est African American 70     GFR, Est Non African American 61    LIPID PANEL      Result Value Ref Range   Cholesterol 148  0 - 200 mg/dL   Triglycerides 109  <150 mg/dL   HDL 36 (*) >39 mg/dL   Total CHOL/HDL Ratio 4.1     VLDL 22  0 - 40 mg/dL   LDL Cholesterol 90  0 - 99 mg/dL  GLUCOSE, POCT (MANUAL RESULT ENTRY)      Result Value Ref Range   POC Glucose 176 (*) 70 - 99 mg/dl  POCT GLYCOSYLATED HEMOGLOBIN (HGB A1C)      Result Value Ref Range   Hemoglobin A1C 8.9       DM2 - on ASA 81mg  Qd, ARB (ace -i allergy), pravachol.  On januvia, glipizide15 mg BID, metfromin 1000mg  BID per med list. Uncontrolled last ov - tried to add onglyza, changed to [place] for better insurance coverage. Weight down from 206 to 203 today. Still has some high blood sugars. Did not increase to 100mg  Januvia, but tolerated 50mg  dose. No symptomatic lows. Home readings:138-224. Usually 140-160's.  Lab Results  Component Value Date   HGBA1C 8.9 04/14/2014    L Foot pain - see may OV -   strain/overuse vs. prior phlebitis -improved in office at that time, but had limited exercise for 6 wks prior. H/o and treatment of phlebitis given. rtc precautions were given. Starter hurting again - on outside of L foot about 2 weeks ago.  Sore., swollen all of a sudden - NKI, but has been exercising more - water aerobics and exercise class. Improved now, - min swelling, but soreness resolved. No calf pain/swelling, no chest pains or dyspnea.     Patient Active Problem List   Diagnosis Date Noted  . HTN (hypertension) 02/10/2012  . DM2 (diabetes mellitus, type 2) 02/10/2012   No past medical history on file. No past surgical history on file. Allergies  Allergen Reactions  . Ace Inhibitors Cough   Prior to Admission medications   Medication Sig Start Date End Date Taking? Authorizing Provider  aspirin 81 MG tablet  Take 81 mg by mouth daily.   Yes Historical Provider, MD  cetirizine (ZYRTEC) 10 MG tablet Take 1 tablet (10 mg total) by mouth daily. 03/25/13  Yes [name], MD  cloNIDine (CATAPRES) 0.2 MG tablet Take 1 tablet (0.2 mg total) by mouth 2 (two) times daily. 04/14/14  Yes [name], MD  cyclobenzaprine (FLEXERIL) 5 MG tablet 1 pill by mouth up to every 8 hours as needed. Start with one pill by mouth each bedtime as needed due to sedation 07/01/13  Yes [name], MD  glipiZIDE (GLUCOTROL) 10 MG tablet Take 1.5 tablets (15 mg total) by mouth 2 (two) times daily before a meal. 04/14/14  Yes [name], MD  glucose blood test strip Use to test blood sugars daily. Dx code 250.00. 01/13/14  Yes [name], MD  metFORMIN (GLUCOPHAGE) 1000 MG tablet Take 1 tablet (1,000 mg total) by mouth 2 (two) times daily with a meal. 04/14/14  Yes [name], MD  metoprolol (TOPROL-XL) 200 MG 24 hr tablet Take 1 tablet (200 mg total) by mouth daily. 04/14/14  Yes [name], MD  pravastatin (PRAVACHOL) 40 MG tablet Take 1 tablet (40 mg total) by mouth daily. 04/14/14   Yes [name], MD  sitaGLIPtin (JANUVIA) 50 MG tablet Take 1 tablet (50 mg total) by mouth daily. 04/20/14  Yes [name], MD  valsartan-hydrochlorothiazide (DIOVAN-HCT) 320-25 MG per tablet Take 1 tablet by mouth daily. 04/14/14  Yes [name], MD  saxagliptin HCl (ONGLYZA) 2.5 MG TABS tablet Take 1 tablet (2.5 mg total) by mouth daily. 04/14/14   [name], MD  triamcinolone cream (KENALOG) 0.1 % Apply topically 2 (two) times daily. 09/17/12   [name], MD   History   Social History  . Marital Status: Married    Spouse Name: N/A    Number of Children: N/A  . Years of Education: N/A   Occupational History  . Not on file.   Social History Main Topics  . Smoking status: Never Smoker   . Smokeless tobacco: Never Used  . Alcohol Use: Not on file  . Drug Use: Not on file  . Sexual Activity: Not on file   Other Topics Concern  . Not on file   Social History Narrative  . No narrative on file       Review of Systems  Constitutional: Negative for fatigue and unexpected weight change.  Respiratory: Negative for chest tightness and shortness of breath.   Cardiovascular: Negative for chest pain, palpitations and leg swelling.  Gastrointestinal: Negative for abdominal pain and blood in stool.  Neurological: Negative for dizziness, syncope, light-headedness and headaches.       Objective:   Physical Exam  Constitutional: She is oriented to person, place, and time. She appears well-developed and well-nourished.  HENT:  Head: Normocephalic and atraumatic.  Mouth/Throat: Oropharynx is clear and moist.  Eyes: Conjunctivae and EOM are normal. Pupils are equal, round, and reactive to light.  Neck: Normal range of motion. No thyromegaly present.  Cardiovascular: Normal rate, regular rhythm, normal heart sounds and intact distal pulses.   No murmur heard. Pulmonary/Chest: Effort normal. She has no wheezes. She has no rales.  Abdominal: Soft. Normal  appearance. She exhibits no abdominal bruit and no pulsatile midline mass. There is no tenderness.  Musculoskeletal:       Left ankle: Achilles tendon normal.       Right lower leg: She  exhibits no tenderness and no swelling.       Left lower leg: She exhibits no tenderness and no swelling.       Feet:  Neurological: She is alert and oriented to person, place, and time.  Microfilament testing WNL bilat feet  Skin: Skin is warm and dry.  Psychiatric: She has a normal mood and affect. Her behavior is normal.    Results for orders placed in visit on 07/28/14  GLUCOSE, POCT (MANUAL RESULT ENTRY)      Result Value Ref Range   POC Glucose 102 (*) 70 - 99 mg/dl  POCT GLYCOSYLATED HEMOGLOBIN (HGB A1C)      Result Value Ref Range   Hemoglobin A1C 7.3          Assessment & Plan:   Susan Miles is a 68 y.o. female Type 2 diabetes mellitus without complication - Plan: [facility] Diabetes Foot Exam, glipiZIDE (GLUCOTROL) 10 MG tablet, metFORMIN (GLUCOPHAGE) 1000 MG tablet, pravastatin (PRAVACHOL) 40 MG tablet, sitaGLIPtin (JANUVIA) 100 MG tablet, POCT glucose (manual entry), POCT glycosylated hemoglobin (Hb A1C)  - improved control. She would like to try higher dose of januvia with recent higher readings. Hypoglycemic precautions reviewed with med combo. Recheck in 3 months. Cont exercise, diet efforts for weight loss.   Essential hypertension - Plan: cloNIDine (CATAPRES) 0.2 MG tablet, metoprolol (TOPROL-XL) 200 MG 24 hr tablet, valsartan-hydrochlorothiazide (DIOVAN-HCT) 320-25 MG per tablet  - stable. No changes. On ASA, ARB with DM2.   Need for prophylactic vaccination and inoculation against influenza - Plan: Flu Vaccine QUAD 36+ mos IM given.   Pain in joint, ankle and foot, left  - now improving. Based on location, may have been sprain vs tenosynovitis. rom and stretches, and rtc if not continuing to improve or worsens.   Meds ordered this encounter  Medications  . cloNIDine (CATAPRES) 0.2  MG tablet    Sig: Take 1 tablet (0.2 mg total) by mouth 2 (two) times daily.    Dispense:  180 tablet    Refill:  1  . glipiZIDE (GLUCOTROL) 10 MG tablet    Sig: Take 1.5 tablets (15 mg total) by mouth 2 (two) times daily before a meal.    Dispense:  270 tablet    Refill:  1  . metFORMIN (GLUCOPHAGE) 1000 MG tablet    Sig: Take 1 tablet (1,000 mg total) by mouth 2 (two) times daily with a meal.    Dispense:  180 tablet    Refill:  1  . metoprolol (TOPROL-XL) 200 MG 24 hr tablet    Sig: Take 1 tablet (200 mg total) by mouth daily.    Dispense:  90 tablet    Refill:  1  . pravastatin (PRAVACHOL) 40 MG tablet    Sig: Take 1 tablet (40 mg total) by mouth daily.    Dispense:  90 tablet    Refill:  1  . sitaGLIPtin (JANUVIA) 100 MG tablet    Sig: Take 1 tablet (100 mg total) by mouth daily.    Dispense:  30 tablet    Refill:  3  . valsartan-hydrochlorothiazide (DIOVAN-HCT) 320-25 MG per tablet    Sig: Take 1 tablet by mouth daily.    Dispense:  90 tablet    Refill:  1   Patient Instructions  Your diabetes control is closer to goal. Continue exercise, diet cahnges, we can increase Januvia to 100mg  each day, but watch for any low blood sugars.  See information below.  If this  occurs -call me and we will decrease the strength of your medicines. Return to the clinic or go to the nearest emergency room if any of your symptoms worsen or new symptoms occur.  Return in 3 months  If ankle pain or swelling increases - recheck, but I suspect this is overuse or sprain with new exercises.  Return to the clinic or go to the nearest emergency room if any of your symptoms worsen or new symptoms occur.   Hypoglycemia Hypoglycemia occurs when the glucose in your blood is too low. Glucose is a type of sugar that is your body's main energy source. Hormones, such as insulin and glucagon, control the level of glucose in the blood. Insulin lowers blood glucose and glucagon increases blood glucose. Having  too much insulin in your blood stream, or not eating enough food containing sugar, can result in hypoglycemia. Hypoglycemia can happen to people with or without diabetes. It can develop quickly and can be a medical emergency.  CAUSES   Missing or delaying meals.  Not eating enough carbohydrates at meals.  Taking too much diabetes medicine.  Not timing your oral diabetes medicine or insulin doses with meals, snacks, and exercise.  Nausea and vomiting.  Certain medicines.  Severe illnesses, such as hepatitis, kidney disorders, and certain eating disorders.  Increased activity or exercise without eating something extra or adjusting medicines.  Drinking too much alcohol.  A nerve disorder that affects body functions like your heart rate, blood pressure, and digestion (autonomic neuropathy).  A condition where the stomach muscles do not function properly (gastroparesis). Therefore, medicines and food may not absorb properly.  Rarely, a tumor of the pancreas can produce too much insulin. SYMPTOMS   Hunger.  Sweating (diaphoresis).  Change in body temperature.  Shakiness.  Headache.  Anxiety.  Lightheadedness.  Irritability.  Difficulty concentrating.  Dry mouth.  Tingling or numbness in the hands or feet.  Restless sleep or sleep disturbances.  Altered speech and coordination.  Change in mental status.  Seizures or prolonged convulsions.  Combativeness.  Drowsiness (lethargic).  Weakness.  Increased heart rate or palpitations.  Confusion.  Pale, gray skin color.  Blurred or double vision.  Fainting. DIAGNOSIS  A physical exam and medical history will be performed. Your caregiver may make a diagnosis based on your symptoms. Blood tests and other lab tests may be performed to confirm a diagnosis. Once the diagnosis is made, your caregiver will see if your signs and symptoms go away once your blood glucose is raised.  TREATMENT  Usually, you can  easily treat your hypoglycemia when you notice symptoms.  Check your blood glucose. If it is less than 70 mg/dl, take one of the following:   3-4 glucose tablets.    cup juice.    cup regular soda.   1 cup skim milk.   -1 tube of glucose gel.   5-6 hard candies.   Avoid high-fat drinks or food that may delay a rise in blood glucose levels.  Do not take more than the recommended amount of sugary foods, drinks, gel, or tablets. Doing so will cause your blood glucose to go too high.   Wait 10-15 minutes and recheck your blood glucose. If it is still less than 70 mg/dl or below your target range, repeat treatment.   Eat a snack if it is more than 1 hour until your next meal.  There may be a time when your blood glucose may go so low that you are unable  to treat yourself at home when you start to notice symptoms. You may need someone to help you. You may even faint or be unable to swallow. If you cannot treat yourself, someone will need to bring you to the hospital.  [place]  If you have diabetes, follow your diabetes management plan by:  Taking your medicines as directed.  Following your exercise plan.  Following your meal plan. Do not skip meals. Eat on time.  Testing your blood glucose regularly. Check your blood glucose before and after exercise. If you exercise longer or different than usual, be sure to check blood glucose more frequently.  Wearing your medical alert jewelry that says you have diabetes.  Identify the cause of your hypoglycemia. Then, develop ways to prevent the recurrence of hypoglycemia.  Do not take a hot bath or shower right after an insulin shot.  Always carry treatment with you. Glucose tablets are the easiest to carry.  If you are going to drink alcohol, drink it only with meals.  Tell friends or family members ways to keep you safe during a seizure. This may include removing hard or sharp objects from the area or  turning you on your side.  Maintain a healthy weight. SEEK MEDICAL CARE IF:   You are having problems keeping your blood glucose in your target range.  You are having frequent episodes of hypoglycemia.  You feel you might be having side effects from your medicines.  You are not sure why your blood glucose is dropping so low.  You notice a change in vision or a new problem with your vision. SEEK IMMEDIATE MEDICAL CARE IF:   Confusion develops.  A change in mental status occurs.  The inability to swallow develops.  Fainting occurs. Document Released: 11/21/2005 Document Revised: 11/26/2013 Document Reviewed: 03/19/2012 [facility] Patient Information 2015 [place], [place]. This information is not intended to replace advice given to you by your health care provider. Make sure you discuss any questions you have with your health care provider.

## 2014-08-16 ENCOUNTER — Other Ambulatory Visit: Payer: Self-pay | Admitting: Family Medicine

## 2014-10-27 ENCOUNTER — Encounter: Payer: Self-pay | Admitting: Family Medicine

## 2014-10-27 ENCOUNTER — Ambulatory Visit (INDEPENDENT_AMBULATORY_CARE_PROVIDER_SITE_OTHER): Payer: Medicare HMO | Admitting: Family Medicine

## 2014-10-27 ENCOUNTER — Other Ambulatory Visit: Payer: Self-pay | Admitting: Radiology

## 2014-10-27 VITALS — BP 148/82 | HR 86 | Temp 98.6°F | Resp 16 | Ht 63.5 in | Wt 201.2 lb

## 2014-10-27 DIAGNOSIS — E119 Type 2 diabetes mellitus without complications: Secondary | ICD-10-CM

## 2014-10-27 DIAGNOSIS — E785 Hyperlipidemia, unspecified: Secondary | ICD-10-CM

## 2014-10-27 DIAGNOSIS — Z23 Encounter for immunization: Secondary | ICD-10-CM

## 2014-10-27 DIAGNOSIS — R21 Rash and other nonspecific skin eruption: Secondary | ICD-10-CM

## 2014-10-27 DIAGNOSIS — E1165 Type 2 diabetes mellitus with hyperglycemia: Secondary | ICD-10-CM

## 2014-10-27 DIAGNOSIS — M5431 Sciatica, right side: Secondary | ICD-10-CM

## 2014-10-27 DIAGNOSIS — L42 Pityriasis rosea: Secondary | ICD-10-CM

## 2014-10-27 DIAGNOSIS — I10 Essential (primary) hypertension: Secondary | ICD-10-CM

## 2014-10-27 DIAGNOSIS — IMO0002 Reserved for concepts with insufficient information to code with codable children: Secondary | ICD-10-CM

## 2014-10-27 LAB — COMPREHENSIVE METABOLIC PANEL
ALK PHOS: 53 U/L (ref 39–117)
ALT: 14 U/L (ref 0–35)
AST: 16 U/L (ref 0–37)
Albumin: 4.4 g/dL (ref 3.5–5.2)
BILIRUBIN TOTAL: 0.4 mg/dL (ref 0.2–1.2)
BUN: 18 mg/dL (ref 6–23)
CO2: 29 mEq/L (ref 19–32)
Calcium: 11.1 mg/dL — ABNORMAL HIGH (ref 8.4–10.5)
Chloride: 100 mEq/L (ref 96–112)
Creat: 1.07 mg/dL (ref 0.50–1.10)
Glucose, Bld: 96 mg/dL (ref 70–99)
POTASSIUM: 3.7 meq/L (ref 3.5–5.3)
Sodium: 140 mEq/L (ref 135–145)
TOTAL PROTEIN: 7.7 g/dL (ref 6.0–8.3)

## 2014-10-27 LAB — LIPID PANEL
Cholesterol: 178 mg/dL (ref 0–200)
HDL: 40 mg/dL (ref >39)
LDL CALC: 114 mg/dL — AB (ref 0–99)
Total CHOL/HDL Ratio: 4.5 Ratio
Triglycerides: 121 mg/dL (ref <150)
VLDL: 24 mg/dL (ref 0–40)

## 2014-10-27 LAB — POCT GLYCOSYLATED HEMOGLOBIN (HGB A1C): Hemoglobin A1C: 7.4

## 2014-10-27 LAB — GLUCOSE, POCT (MANUAL RESULT ENTRY): POC GLUCOSE: 109 mg/dL — AB (ref 70–99)

## 2014-10-27 LAB — POCT SKIN KOH: Skin KOH, POC: NEGATIVE

## 2014-10-27 MED ORDER — METFORMIN HCL 1000 MG PO TABS: 1000.0000 mg | ORAL_TABLET | Freq: Two times a day (BID) | ORAL | Status: DC

## 2014-10-27 MED ORDER — CLONIDINE HCL 0.2 MG PO TABS: 0.2000 mg | ORAL_TABLET | Freq: Two times a day (BID) | ORAL | Status: DC

## 2014-10-27 MED ORDER — GLUCOSE BLOOD VI STRP: ORAL_STRIP | Status: DC

## 2014-10-27 MED ORDER — ATORVASTATIN CALCIUM 10 MG PO TABS: 10.0000 mg | ORAL_TABLET | Freq: Every day | ORAL | Status: DC

## 2014-10-27 MED ORDER — VALSARTAN-HYDROCHLOROTHIAZIDE 320-25 MG PO TABS: 1.0000 | ORAL_TABLET | Freq: Every day | ORAL | Status: DC

## 2014-10-27 MED ORDER — TRIAMCINOLONE ACETONIDE 0.1 % EX CREA: 1.0000 "application " | TOPICAL_CREAM | Freq: Two times a day (BID) | CUTANEOUS | Status: DC | PRN

## 2014-10-27 MED ORDER — METOPROLOL SUCCINATE ER 200 MG PO TB24: 200.0000 mg | ORAL_TABLET | Freq: Every day | ORAL | Status: DC

## 2014-10-27 MED ORDER — GLIPIZIDE 10 MG PO TABS: 15.0000 mg | ORAL_TABLET | Freq: Two times a day (BID) | ORAL | Status: DC

## 2014-10-27 MED ORDER — BLOOD GLUCOSE MONITOR KIT: PACK | Status: DC

## 2014-10-27 MED ORDER — SAXAGLIPTIN HCL 2.5 MG PO TABS: 2.5000 mg | ORAL_TABLET | Freq: Every day | ORAL | Status: DC

## 2014-10-27 MED ORDER — SITAGLIPTIN PHOSPHATE 100 MG PO TABS: 100.0000 mg | ORAL_TABLET | Freq: Every day | ORAL | Status: DC

## 2014-10-27 MED ORDER — ONETOUCH ULTRASOFT LANCETS MISC: Status: DC

## 2014-10-27 NOTE — Telephone Encounter (Signed)
Faxed glucose monitor supplies to [facility] for patient.

## 2014-10-27 NOTE — Progress Notes (Addendum)
Subjective:    Patient ID: Susan Miles, female    DOB: Jan 01, 1946, 68 y.o.   MRN: [id number] This chart was scribed for [name], MD by Cathie Hoops, ED Scribe. The patient was seen in Room 26. The patient's care was started at 2:51 PM.   10/27/2014  Chief Complaint  Patient presents with  . Diabetes  . Hypertension  . Rash    rash on stomach and back    HPI HPI Comments: Pt is here for a follow-up visit. She was last seen by me 3 months ago.  1.) Diabetes A1C three months ago was 7.3, which was improved from 8.9 three months earlier. Did have some elevated readings so increased Januvia to 100 mg. Continued Glipizide 15 mg bid and metformin 100 mg bid. Pt notes her blood sugar meter broke and denies checking her 140-180. She denies symptomatic lows. Pt denies dizziness or diaphoresis.  2.) Hypertension Last visit BP 138/78 and weight was 203 and she is down 2 pounds today. No changes to the regimen at last visit.  She notes she checks her BP at home regularly. She notes her BP is normally 140/82. She denies any symptomatic lows. Pt denies chest pain.  3.) Rash  4.) Hyperlipidemia  She takes Pravachol 40 mg q.d. She has been fasting since 7:30 this morning. No new side effects. Her last lipid panel in May is shown below. Her 10 year ASCVD risk based on today's vitals and last lipid panel is 38.1%  Lab Results  Component Value Date   CHOL 148 04/14/2014   HDL 36* 04/14/2014   LDLCALC 90 04/14/2014   TRIG 109 04/14/2014   CHOLHDL 4.1 04/14/2014    Susan Miles is a 68 y.o. female who presents to the [facility] complaining of new, moderate, gradually worsening rash onset 3.5 months ago. She states her rash itches and is not painful. Pt notes her rash began on her left axilla and has now spread to her chest, thighs bilaterally, lower back and right arm.She used fungus powder twice/day from the pharmacy and denies any relief. Pt denies any cough or  cold symptoms before her rash began. She notes her rash began prior to her use of Januvia. She notes she changed her detergent about one month ago and notes that's when her symptoms worsened. Pt denies rash to face, mouth, feet, or genitals. Pt denies fevers, chills, nausea or vomiting.    Patient Active Problem List   Diagnosis Date Noted  . HTN (hypertension) 02/10/2012  . DM2 (diabetes mellitus, type 2) 02/10/2012   No past medical history on file. No past surgical history on file. Allergies  Allergen Reactions  . Ace Inhibitors Cough   Prior to Admission medications   Medication Sig Start Date End Date Taking? Authorizing Provider  aspirin 81 MG tablet Take 81 mg by mouth daily.   Yes Historical Provider, MD  cetirizine (ZYRTEC) 10 MG tablet Take 1 tablet (10 mg total) by mouth daily. 03/25/13  Yes [name], MD  cloNIDine (CATAPRES) 0.2 MG tablet Take 1 tablet (0.2 mg total) by mouth 2 (two) times daily. 07/28/14  Yes [name], MD  cyclobenzaprine (FLEXERIL) 5 MG tablet 1 pill by mouth up to every 8 hours as needed. Start with one pill by mouth each bedtime as needed due to sedation 07/01/13  Yes [name], MD  glipiZIDE (GLUCOTROL) 10 MG tablet Take 1.5 tablets (15 mg total)  by mouth 2 (two) times daily before a meal. 07/28/14  Yes [name], MD  glucose blood test strip Use to test blood sugars daily. Dx code 250.00. 01/13/14  Yes [name], MD  metFORMIN (GLUCOPHAGE) 1000 MG tablet Take 1 tablet (1,000 mg total) by mouth 2 (two) times daily with a meal. 07/28/14  Yes [name], MD  metoprolol (TOPROL-XL) 200 MG 24 hr tablet Take 1 tablet (200 mg total) by mouth daily. 07/28/14  Yes [name], MD  pravastatin (PRAVACHOL) 40 MG tablet Take 1 tablet (40 mg total) by mouth daily. 07/28/14  Yes [name], MD  saxagliptin HCl (ONGLYZA) 2.5 MG TABS tablet Take 1 tablet (2.5 mg total) by mouth daily. 04/14/14  Yes [name], MD    sitaGLIPtin (JANUVIA) 100 MG tablet Take 1 tablet (100 mg total) by mouth daily. 07/28/14  Yes [name], MD  triamcinolone cream (KENALOG) 0.1 % Apply topically 2 (two) times daily. 09/17/12  Yes [name], MD  valsartan-hydrochlorothiazide (DIOVAN-HCT) 320-25 MG per tablet Take 1 tablet by mouth daily. 07/28/14  Yes [name], MD   History   Social History  . Marital Status: Married    Spouse Name: N/A    Number of Children: N/A  . Years of Education: N/A   Occupational History  . Not on file.   Social History Main Topics  . Smoking status: Never Smoker   . Smokeless tobacco: Never Used  . Alcohol Use: Not on file  . Drug Use: Not on file  . Sexual Activity: Not on file   Other Topics Concern  . Not on file   Social History Narrative     Review of Systems  Constitutional: Negative for fever, chills and diaphoresis.  Cardiovascular: Negative for chest pain.  Gastrointestinal: Negative for nausea and vomiting.  Skin: Positive for color change and rash.  Neurological: Negative for dizziness.   Objective:  . Filed Vitals:   10/27/14 1414  BP: 148/82  Pulse: 86  Temp: 98.6 F (37 C)  TempSrc: Oral  Resp: 16  Height: 5' 3.5" (1.613 m)  Weight: 201 lb 3.2 oz (91.264 kg)  SpO2: 99%    Physical Exam  Constitutional: She is oriented to person, place, and time. She appears well-developed and well-nourished.  HENT:  Head: Normocephalic and atraumatic.  Eyes: Conjunctivae and EOM are normal. Pupils are equal, round, and reactive to light.  Neck: Carotid bruit is not present.  Cardiovascular: Normal rate, regular rhythm, normal heart sounds and intact distal pulses.   Pulmonary/Chest: Effort normal and breath sounds normal.  Abdominal: Soft. She exhibits no pulsatile midline mass. There is no tenderness.  Neurological: She is alert and oriented to person, place, and time.  Skin: Skin is warm and dry. Rash noted.  Multiple scattered patches of  hyper-pigmentation laterally. Slightly elevated, noted across the anterior chest wall, abdominal wall, upper breast. But spares areola and nipple. No inframammary rash. Many of the lesions are oval in appearance, smaller lesions have a more erythematous appearance to them. Clustering of these hyperpigmented areas in the lower back and along skin folds in the right and left flanks. One large patch approximately 4-5 cm under her left axilla.  Psychiatric: She has a normal mood and affect. Her behavior is normal.  Vitals reviewed.  Results for orders placed or performed in visit on 10/27/14  POCT glycosylated hemoglobin (Hb A1C)  Result Value Ref Range   Hemoglobin  A1C 7.4   POCT glucose (manual entry)  Result Value Ref Range   POC Glucose 109 (A) 70 - 99 mg/dl  POCT Skin KOH  Result Value Ref Range   Skin KOH, POC Negative      Assessment & Plan:  3:04 PM- Patient informed of current plan for treatment and evaluation and agrees with plan at this time. Susan Miles is a 68 y.o. female Essential hypertension - Plan: cloNIDine (CATAPRES) 0.2 MG tablet, metoprolol (TOPROL-XL) 200 MG 24 hr tablet, valsartan-hydrochlorothiazide (DIOVAN-HCT) 320-25 MG per tablet, Comprehensive metabolic panel  - borderline in office, home readings ok.   No changes in regimen for now.   Type 2 diabetes mellitus without complication - Plan: [facility] Diabetes Foot Exam, glipiZIDE (GLUCOTROL) 10 MG tablet, metFORMIN (GLUCOPHAGE) 1000 MG tablet, sitaGLIPtin (JANUVIA) 100 MG tablet, POCT glycosylated hemoglobin (Hb A1C), POCT glucose (manual entry)  -not quite at control, stressed diet and exercise approach as close, no new med changes at this time. meds refilled (initially both ONglyza and Januvia, but should only be Januvia - staff message to have Onglyza refill cancelled.   Hyperlipidemia - Plan: Lipid panel  -change pravachol to Lipitor d/t ASCVD risk. rtc precautions if new SE's.   Rash and nonspecific skin eruption,  suspected Pityriasis rosea - Plan: triamcinolone cream (KENALOG) 0.1 %  -second MD exam performed. Suspected Pityriasis. Sx care and course discussed, rtc precautions.    Meds ordered this encounter  Medications  . cloNIDine (CATAPRES) 0.2 MG tablet    Sig: Take 1 tablet (0.2 mg total) by mouth 2 (two) times daily.    Dispense:  180 tablet    Refill:  1  . glipiZIDE (GLUCOTROL) 10 MG tablet    Sig: Take 1.5 tablets (15 mg total) by mouth 2 (two) times daily before a meal.    Dispense:  270 tablet    Refill:  1  . glucose blood test strip    Sig: Use to test blood sugars daily. Dx code 250.00.    Dispense:  100 each    Refill:  12    Dx code E11.9 to check blood sugars daily. Test strips for One Touch Ultra Mini.  . metFORMIN (GLUCOPHAGE) 1000 MG tablet    Sig: Take 1 tablet (1,000 mg total) by mouth 2 (two) times daily with a meal.    Dispense:  180 tablet    Refill:  1  . metoprolol (TOPROL-XL) 200 MG 24 hr tablet    Sig: Take 1 tablet (200 mg total) by mouth daily.    Dispense:  90 tablet    Refill:  1  . saxagliptin HCl (ONGLYZA) 2.5 MG TABS tablet    Sig: Take 1 tablet (2.5 mg total) by mouth daily.    Dispense:  90 tablet    Refill:  1  . sitaGLIPtin (JANUVIA) 100 MG tablet    Sig: Take 1 tablet (100 mg total) by mouth daily.    Dispense:  90 tablet    Refill:  1  . valsartan-hydrochlorothiazide (DIOVAN-HCT) 320-25 MG per tablet    Sig: Take 1 tablet by mouth daily.    Dispense:  90 tablet    Refill:  1  . DISCONTD: Blood Glucose Monitoring Suppl (BLOOD GLUCOSE METER KIT AND SUPPLIES) KIT    Sig: One touch ultra mini monitor to check blood sugars once daily    Dispense:  1 each    Refill:  0    Order Specific Question:  Number  of strips    Answer:  100    Order Specific Question:  Number of lancets    Answer:  100  . Blood Glucose Monitoring Suppl (BLOOD GLUCOSE METER KIT AND SUPPLIES) KIT    Sig: One touch ultra mini monitor to check blood sugars once daily     Dispense:  1 each    Refill:  0    Order Specific Question:  Number of strips    Answer:  100    Order Specific Question:  Number of lancets    Answer:  100  . Lancets (ONETOUCH ULTRASOFT) lancets    Sig: One touch ultra mini to check blood sugar daily dx code E11.9    Dispense:  100 each    Refill:  12  . atorvastatin (LIPITOR) 10 MG tablet    Sig: Take 1 tablet (10 mg total) by mouth daily.    Dispense:  90 tablet    Refill:  1  . triamcinolone cream (KENALOG) 0.1 %    Sig: Apply 1 application topically 2 (two) times daily as needed. To affected itching area.    Dispense:  30 g    Refill:  0   Patient Instructions  You should receive a call or letter about your lab results within the next week to 10 days.  Keep a record of your blood pressures outside of the office and bring them to the next office visit. Starting Lipitor as this may better for you as you have diabetes.  Zyrtec over the counter, or benadryl if needed for itching. Can also apply the hydrocortisone cream twice per day to itchy areas if needed. Return to the clinic or go to the nearest emergency room if any of your symptoms worsen or new symptoms occur. Blood sugar still borderline, but can hold on changes with meds today - work on diet and continue exercise, recheck levels in 3 months.   Pityriasis Rosea Pityriasis rosea is a rash which is probably caused by a virus. It generally starts as a scaly, red patch on the trunk (the area of the body that a t-shirt would cover) but does not appear on sun exposed areas. The rash is usually preceded by an initial larger spot called the "herald patch" a week or more before the rest of the rash appears. Generally within one to two days the rash appears rapidly on the trunk, upper arms, and sometimes the upper legs. The rash usually appears as flat, oval patches of scaly pink color. The rash can also be raised and one is able to feel it with a finger. The rash can also be finely  crinkled and may slough off leaving a ring of scale around the spot. Sometimes a mild sore throat is present with the rash. It usually affects children and young adults in the spring and autumn. Women are more frequently affected than men. TREATMENT  Pityriasis rosea is a self-limited condition. This means it goes away within 4 to 8 weeks without treatment. The spots may persist for several months, especially in darker-colored skin after the rash has resolved and healed. Benadryl and steroid creams may be used if itching is a problem. SEEK MEDICAL CARE IF:   Your rash does not go away or persists longer than three months.  You develop fever and joint pain.  You develop severe headache and confusion.  You develop breathing difficulty, vomiting and/or extreme weakness. Document Released: 12/28/2001 Document Revised: 02/13/2012 Document Reviewed: 01/16/2009 [employer] Patient Information 2015  [employer]. This information is not intended to replace advice given to you by your health care provider. Make sure you discuss any questions you have with your health care provider.   I personally performed the services described in this documentation, which was scribed in my presence. The recorded information has been reviewed and considered, and addended by me as needed.

## 2014-10-27 NOTE — Patient Instructions (Addendum)
You should receive a call or letter about your lab results within the next week to 10 days.  Keep a record of your blood pressures outside of the office and bring them to the next office visit. Starting Lipitor as this may better for you as you have diabetes.  Zyrtec over the counter, or benadryl if needed for itching. Can also apply the hydrocortisone cream twice per day to itchy areas if needed. Return to the clinic or go to the nearest emergency room if any of your symptoms worsen or new symptoms occur. Blood sugar still borderline, but can hold on changes with meds today - work on diet and continue exercise, recheck levels in 3 months.   Pityriasis Rosea Pityriasis rosea is a rash which is probably caused by a virus. It generally starts as a scaly, red patch on the trunk (the area of the body that a t-shirt would cover) but does not appear on sun exposed areas. The rash is usually preceded by an initial larger spot called the "herald patch" a week or more before the rest of the rash appears. Generally within one to two days the rash appears rapidly on the trunk, upper arms, and sometimes the upper legs. The rash usually appears as flat, oval patches of scaly pink color. The rash can also be raised and one is able to feel it with a finger. The rash can also be finely crinkled and may slough off leaving a ring of scale around the spot. Sometimes a mild sore throat is present with the rash. It usually affects children and young adults in the spring and autumn. Women are more frequently affected than men. TREATMENT  Pityriasis rosea is a self-limited condition. This means it goes away within 4 to 8 weeks without treatment. The spots may persist for several months, especially in darker-colored skin after the rash has resolved and healed. Benadryl and steroid creams may be used if itching is a problem. SEEK MEDICAL CARE IF:   Your rash does not go away or persists longer than three months.  You develop  fever and joint pain.  You develop severe headache and confusion.  You develop breathing difficulty, vomiting and/or extreme weakness. Document Released: 12/28/2001 Document Revised: 02/13/2012 Document Reviewed: 01/16/2009 [facility] Patient Information 2015 [place], [place]. This information is not intended to replace advice given to you by your health care provider. Make sure you discuss any questions you have with your health care provider.

## 2014-10-29 ENCOUNTER — Telehealth: Payer: Self-pay

## 2014-10-29 NOTE — Telephone Encounter (Signed)
-----   Message from [name], MD sent at 10/28/2014  9:48 PM EST ----- Please call and cancel the Onglyza prescription from Monday.  She should just be on the Januvia.  Should not be taking both Januvia and Onglyza. It appears both of these were inadvertently pended then refilled.

## 2014-10-29 NOTE — Telephone Encounter (Signed)
Rx has been cancelled.

## 2014-10-31 ENCOUNTER — Ambulatory Visit (INDEPENDENT_AMBULATORY_CARE_PROVIDER_SITE_OTHER): Payer: Medicare HMO

## 2014-10-31 ENCOUNTER — Ambulatory Visit (INDEPENDENT_AMBULATORY_CARE_PROVIDER_SITE_OTHER): Payer: Medicare HMO | Admitting: Family Medicine

## 2014-10-31 VITALS — BP 130/90 | HR 83 | Temp 98.8°F | Resp 18 | Ht 63.5 in | Wt 201.8 lb

## 2014-10-31 DIAGNOSIS — M79672 Pain in left foot: Secondary | ICD-10-CM

## 2014-10-31 DIAGNOSIS — D649 Anemia, unspecified: Secondary | ICD-10-CM

## 2014-10-31 DIAGNOSIS — M109 Gout, unspecified: Secondary | ICD-10-CM

## 2014-10-31 DIAGNOSIS — M10072 Idiopathic gout, left ankle and foot: Secondary | ICD-10-CM

## 2014-10-31 DIAGNOSIS — M25475 Effusion, left foot: Secondary | ICD-10-CM

## 2014-10-31 LAB — POCT CBC
GRANULOCYTE PERCENT: 66.6 % (ref 37–80)
HEMATOCRIT: 36.5 % — AB (ref 37.7–47.9)
Hemoglobin: 11.6 g/dL — AB (ref 12.2–16.2)
Lymph, poc: 2.1 (ref 0.6–3.4)
MCH, POC: 27.4 pg (ref 27–31.2)
MCHC: 31.8 g/dL (ref 31.8–35.4)
MCV: 86.2 fL (ref 80–97)
MID (CBC): 0.4 (ref 0–0.9)
MPV: 8.1 fL (ref 0–99.8)
PLATELET COUNT, POC: 260 10*3/uL (ref 142–424)
POC GRANULOCYTE: 4.9 (ref 2–6.9)
POC LYMPH PERCENT: 27.9 %L (ref 10–50)
POC MID %: 5.5 % (ref 0–12)
RBC: 4.23 M/uL (ref 4.04–5.48)
RDW, POC: 14.3 %
WBC: 7.4 10*3/uL (ref 4.6–10.2)

## 2014-10-31 LAB — URIC ACID: URIC ACID, SERUM: 10.6 mg/dL — AB (ref 2.4–7.0)

## 2014-10-31 MED ORDER — COLCHICINE 0.6 MG PO TABS: ORAL_TABLET | ORAL | Status: DC

## 2014-10-31 MED ORDER — HYDROCODONE-ACETAMINOPHEN 5-325 MG PO TABS: 1.0000 | ORAL_TABLET | Freq: Four times a day (QID) | ORAL | Status: DC | PRN

## 2014-10-31 NOTE — Patient Instructions (Addendum)
Your foot pain is likely from gout, especially from pain starting in big toe previously.  Can take colchicine 2 pills initially then one pill in 1 hour if needed. No further doses for this attack.  If pain returns in joint in future, can start with same dose as today. You should receive a call or letter about your lab results within the next week to 10 days (uric acid). Hydrocodone if needed, Return to the clinic or go to the nearest emergency room if any of your symptoms worsen or new symptoms occur. Do not take your Lipitor (atorvastatin) on same day or day after taking colchicine.   Your blood count is borderline low/anemia today.  Recheck with lab only visit in next 2-3 weeks. Sooner if lightheaded, dizziness, or weakness.   Gout Gout is an inflammatory arthritis caused by a buildup of uric acid crystals in the joints. Uric acid is a chemical that is normally present in the blood. When the level of uric acid in the blood is too high it can form crystals that deposit in your joints and tissues. This causes joint redness, soreness, and swelling (inflammation). Repeat attacks are common. Over time, uric acid crystals can form into masses (tophi) near a joint, destroying bone and causing disfigurement. Gout is treatable and often preventable. CAUSES  The disease begins with elevated levels of uric acid in the blood. Uric acid is produced by your body when it breaks down a naturally found substance called purines. Certain foods you eat, such as meats and fish, contain high amounts of purines. Causes of an elevated uric acid level include:  Being passed down from parent to child (heredity).  Diseases that cause increased uric acid production (such as obesity, psoriasis, and certain cancers).  Excessive alcohol use.  Diet, especially diets rich in meat and seafood.  Medicines, including certain cancer-fighting medicines (chemotherapy), water pills (diuretics), and aspirin.  Chronic kidney disease.  The kidneys are no longer able to remove uric acid well.  Problems with metabolism. Conditions strongly associated with gout include:  Obesity.  High blood pressure.  High cholesterol.  Diabetes. Not everyone with elevated uric acid levels gets gout. It is not understood why some people get gout and others do not. Surgery, joint injury, and eating too much of certain foods are some of the factors that can lead to gout attacks. SYMPTOMS   An attack of gout comes on quickly. It causes intense pain with redness, swelling, and warmth in a joint.  Fever can occur.  Often, only one joint is involved. Certain joints are more commonly involved:  Base of the big toe.  Knee.  Ankle.  Wrist.  Finger. Without treatment, an attack usually goes away in a few days to weeks. Between attacks, you usually will not have symptoms, which is different from many other forms of arthritis. DIAGNOSIS  Your caregiver will suspect gout based on your symptoms and exam. In some cases, tests may be recommended. The tests may include:  Blood tests.  Urine tests.  X-rays.  Joint fluid exam. This exam requires a needle to remove fluid from the joint (arthrocentesis). Using a microscope, gout is confirmed when uric acid crystals are seen in the joint fluid. TREATMENT  There are two phases to gout treatment: treating the sudden onset (acute) attack and preventing attacks (prophylaxis).  Treatment of an Acute Attack.  Medicines are used. These include anti-inflammatory medicines or steroid medicines.  An injection of steroid medicine into the affected joint is  sometimes necessary.  The painful joint is rested. Movement can worsen the arthritis.  You may use warm or cold treatments on painful joints, depending which works best for you.  Treatment to Prevent Attacks.  If you suffer from frequent gout attacks, your caregiver may advise preventive medicine. These medicines are started after the acute  attack subsides. These medicines either help your kidneys eliminate uric acid from your body or decrease your uric acid production. You may need to stay on these medicines for a very long time.  The early phase of treatment with preventive medicine can be associated with an increase in acute gout attacks. For this reason, during the first few months of treatment, your caregiver may also advise you to take medicines usually used for acute gout treatment. Be sure you understand your caregiver's directions. Your caregiver may make several adjustments to your medicine dose before these medicines are effective.  Discuss dietary treatment with your caregiver or dietitian. Alcohol and drinks high in sugar and fructose and foods such as meat, poultry, and seafood can increase uric acid levels. Your caregiver or dietitian can advise you on drinks and foods that should be limited. HOME CARE INSTRUCTIONS   Do not take aspirin to relieve pain. This raises uric acid levels.  Only take over-the-counter or prescription medicines for pain, discomfort, or fever as directed by your caregiver.  Rest the joint as much as possible. When in bed, keep sheets and blankets off painful areas.  Keep the affected joint raised (elevated).  Apply warm or cold treatments to painful joints. Use of warm or cold treatments depends on which works best for you.  Use crutches if the painful joint is in your leg.  Drink enough fluids to keep your urine clear or pale yellow. This helps your body get rid of uric acid. Limit alcohol, sugary drinks, and fructose drinks.  Follow your dietary instructions. Pay careful attention to the amount of protein you eat. Your daily diet should emphasize fruits, vegetables, whole grains, and fat-free or low-fat milk products. Discuss the use of coffee, vitamin C, and cherries with your caregiver or dietitian. These may be helpful in lowering uric acid levels.  Maintain a healthy body weight. SEEK  MEDICAL CARE IF:   You develop diarrhea, vomiting, or any side effects from medicines.  You do not feel better in 24 hours, or you are getting worse. SEEK IMMEDIATE MEDICAL CARE IF:   Your joint becomes suddenly more tender, and you have chills or a fever. MAKE SURE YOU:   Understand these instructions.  Will watch your condition.  Will get help right away if you are not doing well or get worse. Document Released: 11/18/2000 Document Revised: 04/07/2014 Document Reviewed: 07/04/2012 [facility] Patient Information 2015 [place], [place]. This information is not intended to replace advice given to you by your health care provider. Make sure you discuss any questions you have with your health care provider.

## 2014-10-31 NOTE — Progress Notes (Addendum)
Subjective:    Patient ID: Susan Miles, female    DOB: 19-Jan-1946, 68 y.o.   MRN: [id number] This chart was scribed for Dr. [name] by Steva Colder, ED Scribe. The patient was seen in room 12 at 3:56 PM.   Chief Complaint  Patient presents with  . Follow-up    pt states it is doing a little better; she states she can now put some pressure on it and walk  . Foot Pain    HPI Susan Miles is a 68 y.o. female with a medical hx of HTN and DM type 2 who presents today complaining of F/U for left foot pain onset 3 days ago. She is not able to put pressure on it and she walks on crutches. The pain has gotten better since then. She denies any injury to the foot. She denies any recent travel. Few weeks prior had pain and swelling and redness in the great toe area. She states that she is having associated symptoms of warmth. She states that she has tried Tylenol PM and Aleve with no relief for her symptoms. She denies fever, chills, and any other symptoms. She denies any change in her diet.      Patient Active Problem List   Diagnosis Date Noted  . HTN (hypertension) 02/10/2012  . DM2 (diabetes mellitus, type 2) 02/10/2012   History reviewed. No pertinent past medical history. History reviewed. No pertinent past surgical history. Allergies  Allergen Reactions  . Ace Inhibitors Cough   Prior to Admission medications   Medication Sig Start Date End Date Taking? Authorizing Provider  aspirin 81 MG tablet Take 81 mg by mouth daily.   Yes Historical Provider, MD  atorvastatin (LIPITOR) 10 MG tablet Take 1 tablet (10 mg total) by mouth daily. 10/27/14  Yes [name], MD  Blood Glucose Monitoring Suppl (BLOOD GLUCOSE METER KIT AND SUPPLIES) KIT One touch ultra mini monitor to check blood sugars once daily 10/27/14  Yes [name], MD  cetirizine (ZYRTEC) 10 MG tablet Take 1 tablet (10 mg total) by mouth daily. 03/25/13  Yes [name], MD  cloNIDine (CATAPRES) 0.2 MG  tablet Take 1 tablet (0.2 mg total) by mouth 2 (two) times daily. 10/27/14  Yes [name], MD  cyclobenzaprine (FLEXERIL) 5 MG tablet 1 pill by mouth up to every 8 hours as needed. Start with one pill by mouth each bedtime as needed due to sedation 07/01/13  Yes [name], MD  glipiZIDE (GLUCOTROL) 10 MG tablet Take 1.5 tablets (15 mg total) by mouth 2 (two) times daily before a meal. 10/27/14  Yes [name], MD  glucose blood test strip Use to test blood sugars daily. Dx code 250.00. 10/27/14  Yes [name], MD  Lancets [facility] ULTRASOFT) lancets One touch ultra mini to check blood sugar daily dx code E11.9 10/27/14  Yes [name], MD  metFORMIN (GLUCOPHAGE) 1000 MG tablet Take 1 tablet (1,000 mg total) by mouth 2 (two) times daily with a meal. 10/27/14  Yes [name], MD  metoprolol (TOPROL-XL) 200 MG 24 hr tablet Take 1 tablet (200 mg total) by mouth daily. 10/27/14  Yes [name], MD  pravastatin (PRAVACHOL) 40 MG tablet Take 1 tablet (40 mg total) by mouth daily. 07/28/14  Yes [name], MD  sitaGLIPtin (JANUVIA) 100 MG tablet Take 1 tablet (100 mg total) by mouth daily. 10/27/14  Yes [name], MD  triamcinolone cream (KENALOG) 0.1 % Apply 1 application topically 2 (two) times daily as needed. To affected itching area. 10/27/14  Yes [name], MD  valsartan-hydrochlorothiazide (DIOVAN-HCT) 320-25 MG per tablet Take 1 tablet by mouth daily. 10/27/14  Yes [name], MD   History   Social History  . Marital Status: Married    Spouse Name: N/A    Number of Children: N/A  . Years of Education: N/A   Occupational History  . Not on file.   Social History Main Topics  . Smoking status: Never Smoker   . Smokeless tobacco: Never Used  . Alcohol Use: Not on file  . Drug Use: Not on file  . Sexual Activity: Not on file   Other Topics Concern  . Not on file   Social History Narrative       Review of Systems    Musculoskeletal: Positive for arthralgias.       Objective:   Physical Exam  Constitutional: She is oriented to person, place, and time. She appears well-developed and well-nourished. No distress.  HENT:  Head: Normocephalic and atraumatic.  Eyes: EOM are normal.  Neck: Neck supple. No tracheal deviation present.  Cardiovascular: Normal rate.   Pulmonary/Chest: Effort normal. No respiratory distress.  Musculoskeletal: Normal range of motion. She exhibits tenderness.       Left foot: There is tenderness and swelling.  Soft tissue swelling and warmth over the dorsum of her left foot with diffused tenderness of the dorsum and lateral of the left foot. No apparent skin breakdown including interdigital no apparent wounds or open skin. Left ankle and calf are non-tender.   Neurological: She is alert and oriented to person, place, and time.  Skin: Skin is warm, dry and intact.  Psychiatric: She has a normal mood and affect. Her behavior is normal.  Nursing note and vitals reviewed.         Filed Vitals:   10/31/14 1519  BP: 130/90  Pulse: 83  Temp: 98.8 F (37.1 C)  TempSrc: Oral  Resp: 18  Height: 5' 3.5" (1.613 m)  Weight: 201 lb 12.8 oz (91.536 kg)  SpO2: 96%  [facility] reading (PRIMARY) by  Dr. Carlota Raspberry: L foot: degenerative changes with osteophyte off base of 5th mt. No acute fx.   Results for orders placed or performed in visit on 10/31/14  POCT CBC  Result Value Ref Range   WBC 7.4 4.6 - 10.2 K/uL   Lymph, poc 2.1 0.6 - 3.4   POC LYMPH PERCENT 27.9 10 - 50 %L   MID (cbc) 0.4 0 - 0.9   POC MID % 5.5 0 - 12 %M   POC Granulocyte 4.9 2 - 6.9   Granulocyte percent 66.6 37 - 80 %G   RBC 4.23 4.04 - 5.48 M/uL   Hemoglobin 11.6 (A) 12.2 - 16.2 g/dL   HCT, POC 36.5 (A) 37.7 - 47.9 %   MCV 86.2 80 - 97 fL   MCH, POC 27.4 27 - 31.2 pg   MCHC 31.8 31.8 - 35.4 g/dL   RDW, POC 14.3 %   Platelet Count, POC 260 142 - 424 K/uL   MPV 8.1 0 - 99.8 fL       Assessment & Plan:   COORDINATION OF CARE: 4:07 PM-Discussed treatment plan which includes Left foot X-ray, Uric Acid, and Hydrocodone with pt at bedside and pt agreed to plan.  Susan Miles is a 68 y.o. female Left foot pain - Plan:  POCT CBC, Uric acid, DG Foot Complete Left, HYDROcodone-acetaminophen (NORCO/VICODIN) 5-325 MG per tablet, Swelling of foot joint, left - Plan: POCT CBC, Uric acid, DG Foot Complete Left, Acute gout of left foot, unspecified cause - Plan: colchicine 0.6 MG tablet, HYDROcodone-acetaminophen (NORCO/VICODIN) 5-325 MG per tablet  -Suspected gout flare with prior great toe pain.   -check uric acid, start colchicine - 1.55m once, then 0.630min 1 hr.  lortab if needed for pain - SED. rtc precautions.  Anemia, unspecified anemia type - Plan: CBC  -borderline, asx. Lab only visit in next 2-3 weeks.   -rtc if symptomatic.   Meds ordered this encounter  Medications  . colchicine 0.6 MG tablet    Sig: 2 tablets once, then 1 tablet in 1 hour if needed for pain. No further doses per attack.    Dispense:  30 tablet    Refill:  0  . HYDROcodone-acetaminophen (NORCO/VICODIN) 5-325 MG per tablet    Sig: Take 1 tablet by mouth every 6 (six) hours as needed for moderate pain.    Dispense:  15 tablet    Refill:  0   Patient Instructions  Your foot pain is likely from gout, especially from pain starting in big toe previously.  Can take colchicine 2 pills initially then one pill in 1 hour if needed. No further doses for this attack.  If pain returns in joint in future, can start with same dose as today. You should receive a call or letter about your lab results within the next week to 10 days (uric acid). Hydrocodone if needed, Return to the clinic or go to the nearest emergency room if any of your symptoms worsen or new symptoms occur. Do not take your Lipitor (atorvastatin) on same day or day after taking colchicine.   Your blood count is borderline low/anemia today.  Recheck with lab only visit in  next 2-3 weeks. Sooner if lightheaded, dizziness, or weakness.   Gout Gout is an inflammatory arthritis caused by a buildup of uric acid crystals in the joints. Uric acid is a chemical that is normally present in the blood. When the level of uric acid in the blood is too high it can form crystals that deposit in your joints and tissues. This causes joint redness, soreness, and swelling (inflammation). Repeat attacks are common. Over time, uric acid crystals can form into masses (tophi) near a joint, destroying bone and causing disfigurement. Gout is treatable and often preventable. CAUSES  The disease begins with elevated levels of uric acid in the blood. Uric acid is produced by your body when it breaks down a naturally found substance called purines. Certain foods you eat, such as meats and fish, contain high amounts of purines. Causes of an elevated uric acid level include:  Being passed down from parent to child (heredity).  Diseases that cause increased uric acid production (such as obesity, psoriasis, and certain cancers).  Excessive alcohol use.  Diet, especially diets rich in meat and seafood.  Medicines, including certain cancer-fighting medicines (chemotherapy), water pills (diuretics), and aspirin.  Chronic kidney disease. The kidneys are no longer able to remove uric acid well.  Problems with metabolism. Conditions strongly associated with gout include:  Obesity.  High blood pressure.  High cholesterol.  Diabetes. Not everyone with elevated uric acid levels gets gout. It is not understood why some people get gout and others do not. Surgery, joint injury, and eating too much of certain foods are some of the factors that can  lead to gout attacks. SYMPTOMS   An attack of gout comes on quickly. It causes intense pain with redness, swelling, and warmth in a joint.  Fever can occur.  Often, only one joint is involved. Certain joints are more commonly involved:  Base of the  big toe.  Knee.  Ankle.  Wrist.  Finger. Without treatment, an attack usually goes away in a few days to weeks. Between attacks, you usually will not have symptoms, which is different from many other forms of arthritis. DIAGNOSIS  Your caregiver will suspect gout based on your symptoms and exam. In some cases, tests may be recommended. The tests may include:  Blood tests.  Urine tests.  X-rays.  Joint fluid exam. This exam requires a needle to remove fluid from the joint (arthrocentesis). Using a microscope, gout is confirmed when uric acid crystals are seen in the joint fluid. TREATMENT  There are two phases to gout treatment: treating the sudden onset (acute) attack and preventing attacks (prophylaxis).  Treatment of an Acute Attack.  Medicines are used. These include anti-inflammatory medicines or steroid medicines.  An injection of steroid medicine into the affected joint is sometimes necessary.  The painful joint is rested. Movement can worsen the arthritis.  You may use warm or cold treatments on painful joints, depending which works best for you.  Treatment to Prevent Attacks.  If you suffer from frequent gout attacks, your caregiver may advise preventive medicine. These medicines are started after the acute attack subsides. These medicines either help your kidneys eliminate uric acid from your body or decrease your uric acid production. You may need to stay on these medicines for a very long time.  The early phase of treatment with preventive medicine can be associated with an increase in acute gout attacks. For this reason, during the first few months of treatment, your caregiver may also advise you to take medicines usually used for acute gout treatment. Be sure you understand your caregiver's directions. Your caregiver may make several adjustments to your medicine dose before these medicines are effective.  Discuss dietary treatment with your caregiver or dietitian.  Alcohol and drinks high in sugar and fructose and foods such as meat, poultry, and seafood can increase uric acid levels. Your caregiver or dietitian can advise you on drinks and foods that should be limited. HOME CARE INSTRUCTIONS   Do not take aspirin to relieve pain. This raises uric acid levels.  Only take over-the-counter or prescription medicines for pain, discomfort, or fever as directed by your caregiver.  Rest the joint as much as possible. When in bed, keep sheets and blankets off painful areas.  Keep the affected joint raised (elevated).  Apply warm or cold treatments to painful joints. Use of warm or cold treatments depends on which works best for you.  Use crutches if the painful joint is in your leg.  Drink enough fluids to keep your urine clear or pale yellow. This helps your body get rid of uric acid. Limit alcohol, sugary drinks, and fructose drinks.  Follow your dietary instructions. Pay careful attention to the amount of protein you eat. Your daily diet should emphasize fruits, vegetables, whole grains, and fat-free or low-fat milk products. Discuss the use of coffee, vitamin C, and cherries with your caregiver or dietitian. These may be helpful in lowering uric acid levels.  Maintain a healthy body weight. SEEK MEDICAL CARE IF:   You develop diarrhea, vomiting, or any side effects from medicines.  You do not feel  better in 24 hours, or you are getting worse. SEEK IMMEDIATE MEDICAL CARE IF:   Your joint becomes suddenly more tender, and you have chills or a fever. MAKE SURE YOU:   Understand these instructions.  Will watch your condition.  Will get help right away if you are not doing well or get worse. Document Released: 11/18/2000 Document Revised: 04/07/2014 Document Reviewed: 07/04/2012 [facility] Patient Information 2015 [place], [place]. This information is not intended to replace advice given to you by your health care provider. Make sure you discuss any  questions you have with your health care provider.      I personally performed the services described in this documentation, which was scribed in my presence. The recorded information has been reviewed and considered, and addended by me as needed.

## 2014-11-06 ENCOUNTER — Other Ambulatory Visit: Payer: Self-pay | Admitting: Family Medicine

## 2014-11-21 ENCOUNTER — Other Ambulatory Visit (INDEPENDENT_AMBULATORY_CARE_PROVIDER_SITE_OTHER): Payer: Medicare HMO | Admitting: *Deleted

## 2014-11-21 DIAGNOSIS — D649 Anemia, unspecified: Secondary | ICD-10-CM

## 2014-11-22 LAB — CBC
HCT: 36.1 % (ref 36.0–46.0)
Hemoglobin: 12.2 g/dL (ref 12.0–15.0)
MCH: 27.9 pg (ref 26.0–34.0)
MCHC: 33.8 g/dL (ref 30.0–36.0)
MCV: 82.4 fL (ref 78.0–100.0)
MPV: 10.6 fL (ref 9.4–12.4)
PLATELETS: 296 10*3/uL (ref 150–400)
RBC: 4.38 MIL/uL (ref 3.87–5.11)
RDW: 14.4 % (ref 11.5–15.5)
WBC: 5 10*3/uL (ref 4.0–10.5)

## 2014-11-24 LAB — PTH, INTACT AND CALCIUM
CALCIUM: 10.7 mg/dL — AB (ref 8.4–10.5)
PTH: 65 pg/mL — ABNORMAL HIGH (ref 14–64)

## 2015-03-02 ENCOUNTER — Ambulatory Visit (INDEPENDENT_AMBULATORY_CARE_PROVIDER_SITE_OTHER): Payer: Commercial Managed Care - HMO | Admitting: Family Medicine

## 2015-03-02 ENCOUNTER — Encounter: Payer: Self-pay | Admitting: Family Medicine

## 2015-03-02 VITALS — BP 156/89 | HR 71 | Temp 98.3°F | Resp 16 | Ht 63.5 in | Wt 201.4 lb

## 2015-03-02 DIAGNOSIS — E1165 Type 2 diabetes mellitus with hyperglycemia: Secondary | ICD-10-CM | POA: Diagnosis not present

## 2015-03-02 DIAGNOSIS — M10072 Idiopathic gout, left ankle and foot: Secondary | ICD-10-CM

## 2015-03-02 DIAGNOSIS — I10 Essential (primary) hypertension: Secondary | ICD-10-CM | POA: Diagnosis not present

## 2015-03-02 DIAGNOSIS — R21 Rash and other nonspecific skin eruption: Secondary | ICD-10-CM

## 2015-03-02 DIAGNOSIS — M109 Gout, unspecified: Secondary | ICD-10-CM

## 2015-03-02 DIAGNOSIS — E785 Hyperlipidemia, unspecified: Secondary | ICD-10-CM

## 2015-03-02 DIAGNOSIS — M79609 Pain in unspecified limb: Secondary | ICD-10-CM | POA: Diagnosis not present

## 2015-03-02 DIAGNOSIS — E119 Type 2 diabetes mellitus without complications: Secondary | ICD-10-CM

## 2015-03-02 DIAGNOSIS — IMO0002 Reserved for concepts with insufficient information to code with codable children: Secondary | ICD-10-CM

## 2015-03-02 DIAGNOSIS — M79672 Pain in left foot: Secondary | ICD-10-CM | POA: Diagnosis not present

## 2015-03-02 LAB — COMPLETE METABOLIC PANEL WITH GFR
ALK PHOS: 52 U/L (ref 39–117)
ALT: 15 U/L (ref 0–35)
AST: 16 U/L (ref 0–37)
Albumin: 4 g/dL (ref 3.5–5.2)
BILIRUBIN TOTAL: 0.7 mg/dL (ref 0.2–1.2)
BUN: 21 mg/dL (ref 6–23)
CO2: 28 mEq/L (ref 19–32)
CREATININE: 1.13 mg/dL — AB (ref 0.50–1.10)
Calcium: 10.7 mg/dL — ABNORMAL HIGH (ref 8.4–10.5)
Chloride: 100 mEq/L (ref 96–112)
GFR, Est African American: 58 mL/min — ABNORMAL LOW
GFR, Est Non African American: 50 mL/min — ABNORMAL LOW
Glucose, Bld: 105 mg/dL — ABNORMAL HIGH (ref 70–99)
Potassium: 3.7 mEq/L (ref 3.5–5.3)
SODIUM: 137 meq/L (ref 135–145)
Total Protein: 7.1 g/dL (ref 6.0–8.3)

## 2015-03-02 LAB — LIPID PANEL
Cholesterol: 152 mg/dL (ref 0–200)
HDL: 37 mg/dL — ABNORMAL LOW (ref >=46)
LDL Cholesterol: 91 mg/dL (ref 0–99)
Total CHOL/HDL Ratio: 4.1 Ratio
Triglycerides: 121 mg/dL (ref <150)
VLDL: 24 mg/dL (ref 0–40)

## 2015-03-02 LAB — POCT GLYCOSYLATED HEMOGLOBIN (HGB A1C): Hemoglobin A1C: 8.3

## 2015-03-02 LAB — URIC ACID: Uric Acid, Serum: 10.3 mg/dL — ABNORMAL HIGH (ref 2.4–7.0)

## 2015-03-02 LAB — POCT SKIN KOH: Skin KOH, POC: NEGATIVE

## 2015-03-02 LAB — GLUCOSE, POCT (MANUAL RESULT ENTRY): POC Glucose: 117 mg/dl — AB (ref 70–99)

## 2015-03-02 MED ORDER — CLONIDINE HCL 0.2 MG PO TABS: 0.2000 mg | ORAL_TABLET | Freq: Two times a day (BID) | ORAL | Status: DC

## 2015-03-02 MED ORDER — GLIPIZIDE 10 MG PO TABS: 15.0000 mg | ORAL_TABLET | Freq: Two times a day (BID) | ORAL | Status: DC

## 2015-03-02 MED ORDER — VALSARTAN-HYDROCHLOROTHIAZIDE 320-25 MG PO TABS: 1.0000 | ORAL_TABLET | Freq: Every day | ORAL | Status: DC

## 2015-03-02 MED ORDER — AMLODIPINE BESYLATE 2.5 MG PO TABS: 2.5000 mg | ORAL_TABLET | Freq: Every day | ORAL | Status: DC

## 2015-03-02 MED ORDER — SITAGLIPTIN PHOSPHATE 100 MG PO TABS: 100.0000 mg | ORAL_TABLET | Freq: Every day | ORAL | Status: DC

## 2015-03-02 MED ORDER — METOPROLOL SUCCINATE ER 200 MG PO TB24: 200.0000 mg | ORAL_TABLET | Freq: Every day | ORAL | Status: DC

## 2015-03-02 MED ORDER — ATORVASTATIN CALCIUM 20 MG PO TABS: 20.0000 mg | ORAL_TABLET | Freq: Every day | ORAL | Status: DC

## 2015-03-02 MED ORDER — METFORMIN HCL 1000 MG PO TABS: 1000.0000 mg | ORAL_TABLET | Freq: Two times a day (BID) | ORAL | Status: DC

## 2015-03-02 MED ORDER — GLIPIZIDE 10 MG PO TABS: 20.0000 mg | ORAL_TABLET | Freq: Two times a day (BID) | ORAL | Status: DC

## 2015-03-02 NOTE — Patient Instructions (Addendum)
Start new blood pressure medicine - amlodipine - in addition to your other medications.   You should receive a call or letter about your lab results within the next week to 10 days.   I would recommend starting allopurinol to lessen chance of gout flares as your "uric acid" was elevated in November. If you do have another flare in the next 2-3 months, would start this medicine.   *if you do need to take colchicine for gout flare - stop your atorvastatin (Lipitor) for a few days, as the combination of these two can increase risk of muscle problems.   I will refer you to a dermatologist for your rash.  Use eucerin as moisturizer and if needed - the steroid cream.   Increase glipizide to 20mg  twice per day.  Watch for any low blood sugar symptoms.   I increased the Lipitor dose to 20mg .

## 2015-03-02 NOTE — Progress Notes (Signed)
Subjective:    Patient ID: Susan Miles, female    DOB: September 02, 1946, 69 y.o.   MRN: [id number]  HPI Susan Miles is a 69 y.o. female Here for follow up DM and HTN.   HTN - 148/82 at last ov. Elevated slightly further today. Home blood pressure readings - 140-160/80-90's. No missed doses. No new side effects of antihypertensives. No cough, no chest pain, no headaches, or other new side effects. No recent exercise.  Weight 201 at last visit -same today. Body mass index is 35.11 kg/(m^2). Lab Results  Component Value Date   CREATININE 1.07 10/27/2014   Gout - noticed 6 months ago. 2 flairs in past 3 months. Treated with colchicine both times, resolved pain.   -uric acid 10.6 last November.   Diabetes II -   -last A1c elevated slightly at 7.4.  Had been 7.3 prior (but this was down form 8.9). On januvia 100mg  QD, glipizide 15mg  BID, metformin 1000mg  BID. Past 3 weeks - blood sugars in 160-205 range. No symptomatic lows. Has not been as strict with diet recently.  optho visit in few weeks, dentist every 6 months. Takes asa 81mg  Qd. No missed doses Lab Results  Component Value Date   HGBA1C 7.4 10/27/2014   Lab Results  Component Value Date   MICROALBUR 0.97 03/25/2013   Hyperlipidemia -  Lab Results  Component Value Date   CHOL 178 10/27/2014   HDL 40 10/27/2014   LDLCALC 114* 10/27/2014   TRIG 121 10/27/2014   CHOLHDL 4.5 10/27/2014  on pravachol 40mg  QD prior. elevated 10 year ASCVD risk - changed initially to lipitor 10mg  last ov, with plan on increase dose. No new side effects. Fasting for labs today - over 8 hours ago.   Elevated calcium, borderline high PTH at 65 last year - will recheck today.   Lab Results  Component Value Date   PTH 65* 11/21/2014   CALCIUM 10.7* 11/21/2014   Rash - upper arms, chest, back.  Diagnosed as possible pityriasis rosea at last ov in November.  Triamcinolone topical if needed. Had negative skin scraping at that time. Improved, but some  dark areas remain.  Occasionally itches, but has steroid cream for these about once per day.    Review of Systems  Constitutional: Negative for fatigue and unexpected weight change.  Respiratory: Negative for chest tightness and shortness of breath.   Cardiovascular: Negative for chest pain, palpitations and leg swelling.  Gastrointestinal: Negative for abdominal pain and blood in stool.  Neurological: Negative for dizziness, syncope, light-headedness and headaches.      Objective:   Physical Exam  Skin: Skin is warm and dry. Rash (patches of hyperpigemntaion in oval- round appearance upper arms, chest and back. no surrounding erythema. ) noted.      Filed Vitals:   03/02/15 1554  BP: 156/89  Pulse: 71  Temp: 98.3 F (36.8 C)  TempSrc: Oral  Resp: 16  Height: 5' 3.5" (1.613 m)  Weight: 201 lb 6.4 oz (91.354 kg)  SpO2: 97%   Body mass index is 35.11 kg/(m^2).   Results for orders placed or performed in visit on 03/02/15  POCT glucose (manual entry)  Result Value Ref Range   POC Glucose 117 (A) 70 - 99 mg/dl  POCT glycosylated hemoglobin (Hb A1C)  Result Value Ref Range   Hemoglobin A1C 8.3   POCT Skin KOH  Result Value Ref Range   Skin KOH, POC Negative  Assessment & Plan:   Susan Miles is a 68 y.o. female Type 2 diabetes mellitus without complication - Plan: POCT glucose (manual entry), POCT glycosylated hemoglobin (Hb A1C), HM Diabetes Foot Exam, metFORMIN (GLUCOPHAGE) 1000 MG tablet, sitaGLIPtin (JANUVIA) 100 MG tablet, glipiZIDE (GLUCOTROL) 10 MG tablet, Microalbumin, urine, DISCONTINUED: glipiZIDE (GLUCOTROL) 10 MG tablet  - uncontrolled. Discussed diet and exercise, but for now will increase glipizide to 20mg  BID. Hypoglycemic precautions discussed.   -check urine microabumin.  -recheck in 3 months.   Essential hypertension - Plan: cloNIDine (CATAPRES) 0.2 MG tablet, metoprolol (TOPROL-XL) 200 MG 24 hr tablet, valsartan-hydrochlorothiazide  (DIOVAN-HCT) 320-25 MG per tablet, amLODipine (NORVASC) 2.5 MG tablet, COMPLETE METABOLIC PANEL WITH GFR Left foot pain - Plan: Uric acid  -uncontrolled. Add low dose CCB- amlodipine 2.5mg  QD. Monitor home BP's. cmp pending.   Acute gout of left foot, unspecified cause - Plan: Uric acid  -elevated uric acid and risks of recurrent gout gout/detructive arthropathy discussed. She declined start of allopurinol for now, but would highly recommend this if another flare in next few months.   -has colchicine if needed.  Advised to hold Lipitor while taking colchicine d/t myopathy risk.   Rash - Plan: POCT Skin KOH, Ambulatory referral to Dermatology  -initially suspected pityriasis, but persistent rash and hyperpigmentation, along with intermittent pruritus - will refer to dermatology for eval. Ok to cont triamcinolone if needed for itching and eucerin as moisturizer ok.   Hypercalcemia - Plan: PTH, Intact and Calcium  -borderline readings prior. Future lab placed as after pt left, realized these labs can not be added to blood drawn at ov. Recheck PTH and calcium level on future lab draw - left VM on her mobile number regarding this.   Hyperlipidemia - Plan: atorvastatin (LIPITOR) 20 MG tablet, Lipid panel  -lipids pending.   -increase Lipitor due to ASCVD risk, and tolerating current dose.   Meds ordered this encounter  Medications  . cloNIDine (CATAPRES) 0.2 MG tablet    Sig: Take 1 tablet (0.2 mg total) by mouth 2 (two) times daily.    Dispense:  180 tablet    Refill:  1  . DISCONTD: glipiZIDE (GLUCOTROL) 10 MG tablet    Sig: Take 1.5 tablets (15 mg total) by mouth 2 (two) times daily before a meal.    Dispense:  270 tablet    Refill:  1  . metFORMIN (GLUCOPHAGE) 1000 MG tablet    Sig: Take 1 tablet (1,000 mg total) by mouth 2 (two) times daily with a meal.    Dispense:  180 tablet    Refill:  1  . metoprolol (TOPROL-XL) 200 MG 24 hr tablet    Sig: Take 1 tablet (200 mg total) by mouth  daily.    Dispense:  90 tablet    Refill:  1  . sitaGLIPtin (JANUVIA) 100 MG tablet    Sig: Take 1 tablet (100 mg total) by mouth daily.    Dispense:  90 tablet    Refill:  1  . valsartan-hydrochlorothiazide (DIOVAN-HCT) 320-25 MG per tablet    Sig: Take 1 tablet by mouth daily.    Dispense:  90 tablet    Refill:  1  . atorvastatin (LIPITOR) 20 MG tablet    Sig: Take 1 tablet (20 mg total) by mouth daily at 6 PM.    Dispense:  90 tablet    Refill:  1  . amLODipine (NORVASC) 2.5 MG tablet    Sig: Take 1 tablet (2.5 mg  total) by mouth daily.    Dispense:  90 tablet    Refill:  1  . glipiZIDE (GLUCOTROL) 10 MG tablet    Sig: Take 2 tablets (20 mg total) by mouth 2 (two) times daily before a meal.    Dispense:  360 tablet    Refill:  1   Patient Instructions  Start new blood pressure medicine - amlodipine - in addition to your other medications.   You should receive a call or letter about your lab results within the next week to 10 days.   I would recommend starting allopurinol to lessen chance of gout flares as your "uric acid" was elevated in November. If you do have another flare in the next 2-3 months, would start this medicine.   *if you do need to take colchicine for gout flare - stop your atorvastatin (Lipitor) for a few days, as the combination of these two can increase risk of muscle problems.   I will refer you to a dermatologist for your rash.  Use eucerin as moisturizer and if needed - the steroid cream.   Increase glipizide to 20mg  twice per day.  Watch for any low blood sugar symptoms.   I increased the Lipitor dose to 20mg .

## 2015-03-03 LAB — MICROALBUMIN, URINE: Microalb, Ur: 0.4 mg/dL (ref <2.0)

## 2015-03-09 ENCOUNTER — Telehealth: Payer: Self-pay | Admitting: *Deleted

## 2015-03-09 NOTE — Telephone Encounter (Signed)
Pt called back, spoke with here about results. She will follow up for future test ordered

## 2015-03-09 NOTE — Telephone Encounter (Signed)
Acidently hit reviewed on lab note, but i called and left message for pt to call back in regards to lab results.

## 2015-03-11 ENCOUNTER — Other Ambulatory Visit (INDEPENDENT_AMBULATORY_CARE_PROVIDER_SITE_OTHER): Payer: Commercial Managed Care - HMO | Admitting: *Deleted

## 2015-03-11 NOTE — Progress Notes (Signed)
Pt here for lab draw only  

## 2015-03-12 LAB — PTH, INTACT AND CALCIUM
CALCIUM: 10.2 mg/dL (ref 8.4–10.5)
PTH: 116 pg/mL — ABNORMAL HIGH (ref 14–64)

## 2015-03-22 ENCOUNTER — Other Ambulatory Visit: Payer: Self-pay | Admitting: Family Medicine

## 2015-03-24 DIAGNOSIS — E119 Type 2 diabetes mellitus without complications: Secondary | ICD-10-CM | POA: Diagnosis not present

## 2015-03-24 DIAGNOSIS — H5213 Myopia, bilateral: Secondary | ICD-10-CM | POA: Diagnosis not present

## 2015-03-24 DIAGNOSIS — H5210 Myopia, unspecified eye: Secondary | ICD-10-CM | POA: Diagnosis not present

## 2015-03-24 LAB — HM DIABETES EYE EXAM

## 2015-03-26 DIAGNOSIS — L810 Postinflammatory hyperpigmentation: Secondary | ICD-10-CM | POA: Diagnosis not present

## 2015-03-26 DIAGNOSIS — L42 Pityriasis rosea: Secondary | ICD-10-CM | POA: Diagnosis not present

## 2015-03-30 ENCOUNTER — Telehealth: Payer: Self-pay

## 2015-03-30 NOTE — Telephone Encounter (Signed)
[facility] is needing to clarify the reason for referral and diagnosis -referral request rhematology but they are undecided

## 2015-03-31 NOTE — Telephone Encounter (Signed)
E83.52 (ICD-10-CM) - Hypercalcemia

## 2015-04-02 NOTE — Telephone Encounter (Signed)
Spoke with Maudie Mercury and advised she can be seen by a endocrinologist. Spoke with Dr. Carlota Raspberry in clinic.

## 2015-04-02 NOTE — Telephone Encounter (Signed)
Can refer to endocrinology in place of rheumatology if they will evaluate the hypercalcemia. Thanks.

## 2015-04-10 ENCOUNTER — Other Ambulatory Visit: Payer: Self-pay | Admitting: Family Medicine

## 2015-04-10 DIAGNOSIS — Z1231 Encounter for screening mammogram for malignant neoplasm of breast: Secondary | ICD-10-CM

## 2015-04-20 ENCOUNTER — Encounter: Payer: Self-pay | Admitting: Family Medicine

## 2015-05-01 ENCOUNTER — Ambulatory Visit (HOSPITAL_COMMUNITY)
Admission: RE | Admit: 2015-05-01 | Discharge: 2015-05-01 | Disposition: A | Discharge status: Home / Self Care | Payer: Commercial Managed Care - HMO | Source: Ambulatory Visit | Attending: Family Medicine | Admitting: Family Medicine

## 2015-05-01 DIAGNOSIS — Z1231 Encounter for screening mammogram for malignant neoplasm of breast: Secondary | ICD-10-CM | POA: Diagnosis not present

## 2015-05-01 DIAGNOSIS — N6489 Other specified disorders of breast: Secondary | ICD-10-CM

## 2015-05-05 ENCOUNTER — Other Ambulatory Visit: Payer: Self-pay | Admitting: Family Medicine

## 2015-05-05 DIAGNOSIS — R928 Other abnormal and inconclusive findings on diagnostic imaging of breast: Secondary | ICD-10-CM

## 2015-05-08 ENCOUNTER — Ambulatory Visit
Admission: RE | Admit: 2015-05-08 | Discharge: 2015-05-08 | Disposition: A | Discharge status: Home / Self Care | Payer: Commercial Managed Care - HMO | Source: Ambulatory Visit | Attending: Family Medicine | Admitting: Family Medicine

## 2015-05-08 DIAGNOSIS — R928 Other abnormal and inconclusive findings on diagnostic imaging of breast: Secondary | ICD-10-CM

## 2015-05-25 NOTE — Addendum Note (Signed)
Encounter addended by: [name], MD on: 05/25/2015 11:22 AM<BR>     Documentation filed: Problem List

## 2015-06-02 DIAGNOSIS — I10 Essential (primary) hypertension: Secondary | ICD-10-CM | POA: Diagnosis not present

## 2015-06-02 DIAGNOSIS — E210 Primary hyperparathyroidism: Secondary | ICD-10-CM | POA: Diagnosis not present

## 2015-06-03 ENCOUNTER — Telehealth: Payer: Self-pay

## 2015-06-03 DIAGNOSIS — Z9289 Personal history of other medical treatment: Secondary | ICD-10-CM

## 2015-06-03 NOTE — Telephone Encounter (Signed)
Not sure why this wasn't scheduled at her last visit.  We can overbook her this time but will be for diabetes follow up only. Thanks.

## 2015-06-03 NOTE — Telephone Encounter (Signed)
Pt is needing to schedule an appt with dr Carlota Raspberry for a 51mth follow up and dr Carlota Raspberry is booked til after sept is it ok for stacy to over book to keep patient on schedule   Please let stacy at [phone] to let patient know

## 2015-06-03 NOTE — Telephone Encounter (Signed)
Pt is needing to schedule with dr Carlota Raspberry for a 3 mth appt but he is booked until sept and stacy at 33 needs authorization from dr Carlota Raspberry to over book this patient   Please let stacy know

## 2015-06-03 NOTE — Telephone Encounter (Signed)
Yes ok to refer.  

## 2015-06-03 NOTE — Telephone Encounter (Signed)
Can we refer? 

## 2015-06-03 NOTE — Telephone Encounter (Signed)
Patient requesting an order for her to have her bone density test and she wants to go to [facility]. Patients call back number is [phone]

## 2015-06-03 NOTE — Telephone Encounter (Signed)
Will put in order.

## 2015-06-07 ENCOUNTER — Other Ambulatory Visit: Payer: Self-pay | Admitting: Family Medicine

## 2015-06-15 ENCOUNTER — Encounter: Payer: Self-pay | Admitting: Family Medicine

## 2015-06-15 ENCOUNTER — Ambulatory Visit (INDEPENDENT_AMBULATORY_CARE_PROVIDER_SITE_OTHER): Payer: Commercial Managed Care - HMO | Admitting: Family Medicine

## 2015-06-15 VITALS — BP 166/96 | HR 67 | Temp 98.6°F | Resp 16 | Ht 63.0 in | Wt 200.8 lb

## 2015-06-15 DIAGNOSIS — M79672 Pain in left foot: Secondary | ICD-10-CM | POA: Diagnosis not present

## 2015-06-15 DIAGNOSIS — E119 Type 2 diabetes mellitus without complications: Secondary | ICD-10-CM

## 2015-06-15 DIAGNOSIS — I10 Essential (primary) hypertension: Secondary | ICD-10-CM

## 2015-06-15 DIAGNOSIS — M109 Gout, unspecified: Secondary | ICD-10-CM

## 2015-06-15 DIAGNOSIS — N289 Disorder of kidney and ureter, unspecified: Secondary | ICD-10-CM

## 2015-06-15 DIAGNOSIS — E213 Hyperparathyroidism, unspecified: Secondary | ICD-10-CM | POA: Diagnosis not present

## 2015-06-15 LAB — BASIC METABOLIC PANEL
BUN: 12 mg/dL (ref 6–23)
CO2: 26 meq/L (ref 19–32)
CREATININE: 0.86 mg/dL (ref 0.50–1.10)
Calcium: 10.4 mg/dL (ref 8.4–10.5)
Chloride: 105 mEq/L (ref 96–112)
Glucose, Bld: 82 mg/dL (ref 70–99)
POTASSIUM: 4.1 meq/L (ref 3.5–5.3)
SODIUM: 141 meq/L (ref 135–145)

## 2015-06-15 LAB — GLUCOSE, POCT (MANUAL RESULT ENTRY): POC Glucose: 90 mg/dl (ref 70–99)

## 2015-06-15 LAB — URIC ACID: Uric Acid, Serum: 7.6 mg/dL — ABNORMAL HIGH (ref 2.4–7.0)

## 2015-06-15 LAB — POCT GLYCOSYLATED HEMOGLOBIN (HGB A1C): Hemoglobin A1C: 7.6

## 2015-06-15 MED ORDER — ALLOPURINOL 100 MG PO TABS
100.0000 mg | ORAL_TABLET | Freq: Every day | ORAL | Status: DC
Start: 2015-06-15 — End: 2015-09-21

## 2015-06-15 NOTE — Patient Instructions (Signed)
Keep a record of your blood pressures outside of the office and if running over 140/90 - call me so we can add in another medication (as we may need to start a medicine like spirinolactone to replace the HCTZ that you were taken off of).   Start allopurinol once per day to lessen gout flares (and to allow you to exercise/walk more).

## 2015-06-15 NOTE — Progress Notes (Signed)
Subjective:    Patient ID: Susan Miles, female    DOB: 29-Dec-1945, 69 y.o.   MRN: [id number] This chart was scribed for Susan Ray, MD by Zola Button, Medical Scribe. This patient was seen in Room 21 and the patient's care was started at 4:46 PM.   HPI HPI Comments: Susan Miles is a 69 y.o. female with a history of DM, gout, hyperparathyroidism, and hypertension who presents to the [facility] for a follow-up for diabetes and hypertension.  Diabetes: She takes metformin 1000 mg BID and Januvia 100 mg qd, and was increased to 20 mg BID glipizide at last visit due to HgbA1c 8.3. Urine micro albumin was normal. Weight at last visit was 201, 200 today. She has been getting blood sugar readings at home of 104-181. Patient denies symptomatic lows. She does not exercise due to her gout. She has not changed her diet.  Gout: Patient has had intermittent flare-ups in her left great toe. When she was treated in March earlier this year, she had had gout flare-ups 2 times that month, both times treated with colchicine. Her uric acid was still elevated at 10.3 last visit. Patient has had 3-4 flare-ups since her last visit with me. She did not take colchicine for her most recent flare-up.  Hypertension: Uncontrolled with BP 156/89 at last visit and home readings 140s-160s. We added Norvasc 2.5 mg qd. Continued on Toprol 200 mg, Diovan HCT 320 mg and Catapres 0.2 mg BID. Her blood pressure was 138/86 this morning. She states she does not check her blood pressure often. Her blood pressure was 126/80 during her visit with her rheumatologist last week. After seeing Dr. Sherlean Foot, she was discontinued from HCTZ due to concerns for her calcium levels.   Hyperparathyroidism with Elevated Calcium and PTH: Patient saw Dr. Sherlean Foot for this last week and will see her again in 6 months.   Patient Active Problem List   Diagnosis Date Noted  . Breast asymmetry 05/01/2015  . HTN (hypertension)  02/10/2012  . DM2 (diabetes mellitus, type 2) 02/10/2012   No past medical history on file. No past surgical history on file. Allergies  Allergen Reactions  . Ace Inhibitors Cough   Prior to Admission medications   Medication Sig Start Date End Date Taking? Authorizing Provider  amLODipine (NORVASC) 2.5 MG tablet Take 1 tablet (2.5 mg total) by mouth daily. 03/02/15  Yes [name], MD  aspirin 81 MG tablet Take 81 mg by mouth daily.   Yes Historical Provider, MD  atorvastatin (LIPITOR) 20 MG tablet Take 1 tablet (20 mg total) by mouth daily at 6 PM. 03/02/15  Yes [name], MD  Blood Glucose Monitoring Suppl (BLOOD GLUCOSE METER KIT AND SUPPLIES) KIT One touch ultra mini monitor to check blood sugars once daily 10/27/14  Yes [name], MD  cetirizine (ZYRTEC) 10 MG tablet Take 1 tablet (10 mg total) by mouth daily. 03/25/13  Yes [name], MD  cloNIDine (CATAPRES) 0.2 MG tablet Take 1 tablet (0.2 mg total) by mouth 2 (two) times daily. 03/02/15  Yes [name], MD  cloNIDine (CATAPRES) 0.2 MG tablet TAKE 1 TABLET (0.2 MG TOTAL) BY MOUTH 2 (TWO) TIMES DAILY. 06/09/15  Yes Tereasa Coop, PA-C  colchicine 0.6 MG tablet 2 tablets once, then 1 tablet in 1 hour if needed for pain. No further doses per attack. 10/31/14  Yes [name], MD  cyclobenzaprine (FLEXERIL) 5 MG tablet  1 pill by mouth up to every 8 hours as needed. Start with one pill by mouth each bedtime as needed due to sedation 07/01/13  Yes [name], MD  glipiZIDE (GLUCOTROL) 10 MG tablet Take 2 tablets (20 mg total) by mouth 2 (two) times daily before a meal. 03/02/15  Yes [name], MD  glucose blood test strip Use to test blood sugars daily. Dx code 250.00. 10/27/14  Yes [name], MD  HYDROcodone-acetaminophen (NORCO/VICODIN) 5-325 MG per tablet Take 1 tablet by mouth every 6 (six) hours as needed for moderate pain. 10/31/14  Yes [name], MD  Lancets [facility]  ULTRASOFT) lancets One touch ultra mini to check blood sugar daily dx code E11.9 10/27/14  Yes [name], MD  metFORMIN (GLUCOPHAGE) 1000 MG tablet Take 1 tablet (1,000 mg total) by mouth 2 (two) times daily with a meal. 03/02/15  Yes [name], MD  metoprolol (TOPROL-XL) 200 MG 24 hr tablet Take 1 tablet (200 mg total) by mouth daily. 03/02/15  Yes [name], MD  pravastatin (PRAVACHOL) 40 MG tablet Take 1 tablet (40 mg total) by mouth daily. 07/28/14  Yes [name], MD  sitaGLIPtin (JANUVIA) 100 MG tablet Take 1 tablet (100 mg total) by mouth daily. 03/02/15  Yes [name], MD  triamcinolone cream (KENALOG) 0.1 % Apply 1 application topically 2 (two) times daily as needed. To affected itching area. 10/27/14  Yes [name], MD  valsartan (DIOVAN) 320 MG tablet Take 320 mg by mouth daily.   Yes Historical Provider, MD   History   Social History  . Marital Status: Married    Spouse Name: N/A  . Number of Children: N/A  . Years of Education: N/A   Occupational History  . Not on file.   Social History Main Topics  . Smoking status: Never Smoker   . Smokeless tobacco: Never Used  . Alcohol Use: Not on file  . Drug Use: Not on file  . Sexual Activity: Not on file   Other Topics Concern  . Not on file   Social History Narrative     Review of Systems  Constitutional: Negative for fatigue and unexpected weight change.  Respiratory: Negative for chest tightness and shortness of breath.   Cardiovascular: Negative for chest pain, palpitations and leg swelling.  Gastrointestinal: Negative for abdominal pain and blood in stool.  Neurological: Negative for dizziness, syncope, light-headedness and headaches.       Objective:   Physical Exam  Constitutional: She is oriented to person, place, and time. She appears well-developed and well-nourished.  HENT:  Head: Normocephalic and atraumatic.  Eyes: Conjunctivae and EOM are normal. Pupils are equal,  round, and reactive to light.  Neck: Carotid bruit is not present.  Cardiovascular: Normal rate, regular rhythm, normal heart sounds and intact distal pulses.   Pulmonary/Chest: Effort normal and breath sounds normal.  Abdominal: Soft. She exhibits no pulsatile midline mass. There is no tenderness.  Musculoskeletal:  Left foot non-tender.  Neurological: She is alert and oriented to person, place, and time.  Skin: Skin is warm and dry.  Psychiatric: She has a normal mood and affect. Her behavior is normal.  Vitals reviewed.    Results for orders placed or performed in visit on 06/15/15  POCT glucose (manual entry)  Result Value Ref Range   POC Glucose 90 70 - 99 mg/dl  POCT glycosylated hemoglobin (Hb A1C)  Result Value Ref Range  Hemoglobin A1C 7.6     Filed Vitals:   06/15/15 1556  BP: 166/96  Pulse: 67  Temp: 98.6 F (37 C)  TempSrc: Oral  Resp: 16  Height: _0  (1.6 m)  Weight: 200 lb 12.8 oz (91.082 kg)  SpO2: 98%       Assessment & Plan:   PRESLEI BLAKLEY is a 69 y.o. female Type 2 diabetes mellitus without complication - Plan: POCT glucose (manual entry), POCT glycosylated hemoglobin (Hb A1C)  - improved, but not at goal. Anticipate improvement with exercise and weight loss and will treat gout as below to allow resumption of exercise.  No change in meds for now. Recheck in next 3 months.   Renal insufficiency - Plan: Basic metabolic panel repeated. Avoid NSAIDS, nephrotoxins, and stay hydrated.   Left foot pain, Gout of left foot, unspecified cause, unspecified chronicity - Plan: Uric Acid, allopurinol (ZYLOPRIM) 100 MG tablet  -recurrent/frequent gout episodes and significantly elevated uric acid prior. Mgt options discussed, but will start allopurinol to lessen flares and hopefully allow her to resume exercising. SED and risks discussed. Understanding expressed.   Essential hypertension  - elevated in office. May need change in meds as taken off HCTZ by  rheumatology.  Consider spirinolactone. She will call with home readings to help decide this.   Hyperparathyroidism  - followed by Rheum.   Meds ordered this encounter  Medications  . valsartan (DIOVAN) 320 MG tablet    Sig: Take 320 mg by mouth daily.  Marland Kitchen allopurinol (ZYLOPRIM) 100 MG tablet    Sig: Take 1 tablet (100 mg total) by mouth daily.    Dispense:  30 tablet    Refill:  3   Patient Instructions  Keep a record of your blood pressures outside of the office and if running over 140/90 - call me so we can add in another medication (as we may need to start a medicine like spirinolactone to replace the HCTZ that you were taken off of).   Start allopurinol once per day to lessen gout flares (and to allow you to exercise/walk more).     I personally performed the services described in this documentation, which was scribed in my presence. The recorded information has been reviewed and considered, and addended by me as needed.

## 2015-06-17 ENCOUNTER — Other Ambulatory Visit: Payer: Self-pay | Admitting: Family Medicine

## 2015-06-17 DIAGNOSIS — Z780 Asymptomatic menopausal state: Secondary | ICD-10-CM | POA: Diagnosis not present

## 2015-06-21 ENCOUNTER — Other Ambulatory Visit: Payer: Self-pay | Admitting: Family Medicine

## 2015-06-22 ENCOUNTER — Ambulatory Visit: Payer: Commercial Managed Care - HMO | Admitting: Family Medicine

## 2015-06-25 ENCOUNTER — Telehealth: Payer: Self-pay | Admitting: Family Medicine

## 2015-06-25 NOTE — Telephone Encounter (Signed)
Left message for pt to call back  °

## 2015-06-25 NOTE — Telephone Encounter (Signed)
Patient states that she is receiving a different medication than what she normally gets. She normally gets the generic form of Lipitor however her most recent prescription is for 20mg  and the bottle says something about calcium. Please explain.  Lipitor generic brand   Now she gets generic brand 20mg  calcium

## 2015-06-26 NOTE — Telephone Encounter (Signed)
Spoke with pt, went over her medication question. The calcium in in the name but it is still the same medication. She also states her BP is still running. This morning her BP 189/90, 197/112. Dr Carlota Raspberry did you want to change her BP medication or RTC?

## 2015-07-01 ENCOUNTER — Other Ambulatory Visit: Payer: Self-pay | Admitting: Family Medicine

## 2015-07-02 DIAGNOSIS — E210 Primary hyperparathyroidism: Secondary | ICD-10-CM | POA: Diagnosis not present

## 2015-09-05 ENCOUNTER — Other Ambulatory Visit: Payer: Self-pay | Admitting: Physician Assistant

## 2015-09-21 ENCOUNTER — Ambulatory Visit (INDEPENDENT_AMBULATORY_CARE_PROVIDER_SITE_OTHER): Payer: Commercial Managed Care - HMO | Admitting: Family Medicine

## 2015-09-21 ENCOUNTER — Encounter: Payer: Self-pay | Admitting: Family Medicine

## 2015-09-21 VITALS — BP 137/78 | HR 85 | Temp 98.7°F | Resp 16 | Ht 63.5 in | Wt 194.2 lb

## 2015-09-21 DIAGNOSIS — E785 Hyperlipidemia, unspecified: Secondary | ICD-10-CM

## 2015-09-21 DIAGNOSIS — Z23 Encounter for immunization: Secondary | ICD-10-CM

## 2015-09-21 DIAGNOSIS — M10071 Idiopathic gout, right ankle and foot: Secondary | ICD-10-CM

## 2015-09-21 DIAGNOSIS — E119 Type 2 diabetes mellitus without complications: Secondary | ICD-10-CM | POA: Diagnosis not present

## 2015-09-21 DIAGNOSIS — M109 Gout, unspecified: Secondary | ICD-10-CM | POA: Diagnosis not present

## 2015-09-21 DIAGNOSIS — J309 Allergic rhinitis, unspecified: Secondary | ICD-10-CM | POA: Diagnosis not present

## 2015-09-21 DIAGNOSIS — I10 Essential (primary) hypertension: Secondary | ICD-10-CM | POA: Diagnosis not present

## 2015-09-21 DIAGNOSIS — M10072 Idiopathic gout, left ankle and foot: Secondary | ICD-10-CM | POA: Diagnosis not present

## 2015-09-21 LAB — LIPID PANEL
CHOL/HDL RATIO: 3.5 ratio (ref <=5.0)
CHOLESTEROL: 136 mg/dL (ref 125–200)
HDL: 39 mg/dL — AB (ref >=46)
LDL CALC: 79 mg/dL (ref <130)
TRIGLYCERIDES: 91 mg/dL (ref <150)
VLDL: 18 mg/dL (ref <30)

## 2015-09-21 LAB — URIC ACID: Uric Acid, Serum: 7.9 mg/dL — ABNORMAL HIGH (ref 2.4–7.0)

## 2015-09-21 LAB — COMPREHENSIVE METABOLIC PANEL
ALBUMIN: 4 g/dL (ref 3.6–5.1)
ALT: 13 U/L (ref 6–29)
AST: 11 U/L (ref 10–35)
Alkaline Phosphatase: 56 U/L (ref 33–130)
BUN: 22 mg/dL (ref 7–25)
CHLORIDE: 101 mmol/L (ref 98–110)
CO2: 28 mmol/L (ref 20–31)
Calcium: 10.8 mg/dL — ABNORMAL HIGH (ref 8.6–10.4)
Creat: 1.18 mg/dL — ABNORMAL HIGH (ref 0.50–0.99)
Glucose, Bld: 128 mg/dL — ABNORMAL HIGH (ref 65–99)
POTASSIUM: 4 mmol/L (ref 3.5–5.3)
Sodium: 140 mmol/L (ref 135–146)
TOTAL PROTEIN: 7.5 g/dL (ref 6.1–8.1)
Total Bilirubin: 0.5 mg/dL (ref 0.2–1.2)

## 2015-09-21 LAB — HEMOGLOBIN A1C: Hgb A1c MFr Bld: 7.4 % — AB (ref 4.0–6.0)

## 2015-09-21 LAB — GLUCOSE, POCT (MANUAL RESULT ENTRY): POC Glucose: 150 mg/dl — AB (ref 70–99)

## 2015-09-21 LAB — POCT GLYCOSYLATED HEMOGLOBIN (HGB A1C): Hemoglobin A1C: 7.4

## 2015-09-21 MED ORDER — METFORMIN HCL 1000 MG PO TABS: 1000.0000 mg | ORAL_TABLET | Freq: Two times a day (BID) | ORAL | Status: DC

## 2015-09-21 MED ORDER — AMLODIPINE BESYLATE 2.5 MG PO TABS: 2.5000 mg | ORAL_TABLET | Freq: Every day | ORAL | Status: DC

## 2015-09-21 MED ORDER — COLCHICINE 0.6 MG PO TABS: ORAL_TABLET | ORAL | Status: DC

## 2015-09-21 MED ORDER — ATORVASTATIN CALCIUM 20 MG PO TABS: 20.0000 mg | ORAL_TABLET | Freq: Every day | ORAL | Status: DC

## 2015-09-21 MED ORDER — ALLOPURINOL 100 MG PO TABS: 200.0000 mg | ORAL_TABLET | Freq: Every day | ORAL | Status: DC

## 2015-09-21 MED ORDER — SITAGLIPTIN PHOSPHATE 100 MG PO TABS: ORAL_TABLET | ORAL | Status: DC

## 2015-09-21 MED ORDER — HYDROCODONE-ACETAMINOPHEN 5-325 MG PO TABS: 1.0000 | ORAL_TABLET | Freq: Four times a day (QID) | ORAL | Status: DC | PRN

## 2015-09-21 MED ORDER — METOPROLOL SUCCINATE ER 200 MG PO TB24: 200.0000 mg | ORAL_TABLET | Freq: Every day | ORAL | Status: DC

## 2015-09-21 MED ORDER — CLONIDINE HCL 0.2 MG PO TABS: ORAL_TABLET | ORAL | Status: DC

## 2015-09-21 MED ORDER — CETIRIZINE HCL 10 MG PO TABS: 10.0000 mg | ORAL_TABLET | Freq: Every day | ORAL | Status: DC

## 2015-09-21 MED ORDER — GLIPIZIDE 10 MG PO TABS: 20.0000 mg | ORAL_TABLET | Freq: Two times a day (BID) | ORAL | Status: DC

## 2015-09-21 MED ORDER — VALSARTAN 320 MG PO TABS: 320.0000 mg | ORAL_TABLET | Freq: Every day | ORAL | Status: DC

## 2015-09-21 NOTE — Patient Instructions (Addendum)
Increase allopurinol to 2 pills per day. Colchicine up to 3 pills per attack (do not take atorvastatin for a few days if taking colchicine). If needed for breakthrough pain - can use hydrocodone, but be careful with side effects of this medicine as we discussed.   Blood pressure and weight are improving - keep up the good work!  You should receive a call or letter about your lab results within the next week to 10 days.   Still borderline control of diabetes.  Should be controlled once exercise resumes. If Januvia too costly, let me know. Another option may be Invokana - you can ask the pharmacist if this would be less costly for you.   Follow up in 3 months.

## 2015-09-21 NOTE — Progress Notes (Signed)
Subjective:  This chart was scribed for Susan Ray, MD by Moises Blood, Medical Scribe. This patient was seen in room 24 and the patient's care was started 1:27 PM.    Patient ID: Susan Miles, female    DOB: 1946/08/18, 69 y.o.   MRN: [id number]  HPI Susan Miles is a 69 y.o. female Here for follow up.   DM Lab Results  Component Value Date   HGBA1C 7.6 06/15/2015   No change in medication during last visit. Continue on exercise Wt Readings from Last 3 Encounters:  09/21/15 194 lb 3.2 oz (88.089 kg)  06/15/15 200 lb 12.8 oz (91.082 kg)  03/02/15 201 lb 6.4 oz (91.354 kg)  She denies exercise due to pain in feet. She's been careful with her diet; she's cut out all red meat from her diet.   Her sugars at home are running 134-192. She denies any symptomatic lows.  Lab Results  Component Value Date   MICROALBUR 0.4 03/02/2015   Vision She saw eye doctor. She denies retinopathy.   Dentist She sees her dentist annually.   HTN Elevated last visit. She denies any complications with her medications.   Health Maintenance Bone Density test back in July 2016, normal.  She received a flu vaccine today.  Prevnar received in 2015.   Allergic rhinitis Uses zyrtec QD as needed. She denies using nasal spray.   Gout Multiple flare ups this year. Treated with colchicine with uric acid up to 10.3. Started allopurinol 100 mg qd at the July visit.  She's had about 5 flare ups since July. She's been taking allopurinol once a day. She ran out of colchicine. She denies any complications with the medications. This time, her gout is located in her right ankle which started 2 weeks ago. It was getting better, but 3 days ago, she was standing up while working at [employer]. It flared up more that night.   Renal insufficiency Lab Results  Component Value Date   CREATININE 0.86 06/15/2015   HLD Lab Results  Component Value Date   CHOL 152 03/02/2015   HDL 37* 03/02/2015   LDLCALC 91  03/02/2015   TRIG 121 03/02/2015   CHOLHDL 4.1 03/02/2015   Lab Results  Component Value Date   ALT 15 03/02/2015   AST 16 03/02/2015   ALKPHOS 52 03/02/2015   BILITOT 0.7 03/02/2015    Patient Active Problem List   Diagnosis Date Noted  . Breast asymmetry 05/01/2015  . HTN (hypertension) 02/10/2012  . DM2 (diabetes mellitus, type 2) ([place]) 02/10/2012   No past medical history on file. No past surgical history on file. Allergies  Allergen Reactions  . Ace Inhibitors Cough   Prior to Admission medications   Medication Sig Start Date End Date Taking? Authorizing Provider  allopurinol (ZYLOPRIM) 100 MG tablet Take 1 tablet (100 mg total) by mouth daily. 06/15/15   [name], MD  amLODipine (NORVASC) 2.5 MG tablet Take 1 tablet (2.5 mg total) by mouth daily. 03/02/15   [name], MD  aspirin 81 MG tablet Take 81 mg by mouth daily.    Historical Provider, MD  atorvastatin (LIPITOR) 20 MG tablet Take 1 tablet (20 mg total) by mouth daily at 6 PM. 03/02/15   [name], MD  Blood Glucose Monitoring Suppl (BLOOD GLUCOSE METER KIT AND SUPPLIES) KIT One touch ultra mini monitor to check blood sugars once daily 10/27/14   [name], MD  cetirizine (ZYRTEC) 10  MG tablet Take 1 tablet (10 mg total) by mouth daily. 03/25/13   [name], MD  cloNIDine (CATAPRES) 0.2 MG tablet TAKE 1 TABLET (0.2 MG TOTAL) BY MOUTH 2 TIMES DAILY 09/06/15   Mancel Bale, PA-C  colchicine 0.6 MG tablet 2 tablets once, then 1 tablet in 1 hour if needed for pain. No further doses per attack. 10/31/14   [name], MD  cyclobenzaprine (FLEXERIL) 5 MG tablet 1 pill by mouth up to every 8 hours as needed. Start with one pill by mouth each bedtime as needed due to sedation 07/01/13   [name], MD  glipiZIDE (GLUCOTROL) 10 MG tablet Take 2 tablets (20 mg total) by mouth 2 (two) times daily before a meal. 03/02/15   [name], MD  glucose blood test strip Use to test blood  sugars daily. Dx code 250.00. 10/27/14   [name], MD  HYDROcodone-acetaminophen (NORCO/VICODIN) 5-325 MG per tablet Take 1 tablet by mouth every 6 (six) hours as needed for moderate pain. 10/31/14   [name], MD  Lancets [facility] ULTRASOFT) lancets One touch ultra mini to check blood sugar daily dx code E11.9 10/27/14   [name], MD  metFORMIN (GLUCOPHAGE) 1000 MG tablet Take 1 tablet (1,000 mg total) by mouth 2 (two) times daily with a meal. 03/02/15   [name], MD  metoprolol (TOPROL-XL) 200 MG 24 hr tablet Take 1 tablet (200 mg total) by mouth daily. 03/02/15   [name], MD  pravastatin (PRAVACHOL) 40 MG tablet Take 1 tablet (40 mg total) by mouth daily. 07/28/14   [name], MD  sitaGLIPtin (JANUVIA) 100 MG tablet TAKE 1 TABLET BY MOUTH EVERY DAY 07/01/15   Chelle Jeffery, PA-C  triamcinolone cream (KENALOG) 0.1 % Apply 1 application topically 2 (two) times daily as needed. To affected itching area. 10/27/14   [name], MD  valsartan (DIOVAN) 320 MG tablet Take 320 mg by mouth daily.    Historical Provider, MD   Social History   Social History  . Marital Status: Married    Spouse Name: N/A  . Number of Children: N/A  . Years of Education: N/A   Occupational History  . Not on file.   Social History Main Topics  . Smoking status: Never Smoker   . Smokeless tobacco: Never Used  . Alcohol Use: Not on file  . Drug Use: Not on file  . Sexual Activity: Not on file   Other Topics Concern  . Not on file   Social History Narrative     Review of Systems  Constitutional: Negative for fatigue and unexpected weight change.  HENT: Negative for dental problem.   Eyes: Negative for pain.  Respiratory: Negative for chest tightness and shortness of breath.   Cardiovascular: Negative for chest pain, palpitations and leg swelling.  Gastrointestinal: Negative for abdominal pain and blood in stool.  Musculoskeletal: Positive for joint  swelling (right ankle) and arthralgias (right ankle).  Skin: Negative for rash and wound.  Neurological: Negative for dizziness, syncope, light-headedness and headaches.       Objective:   Physical Exam  Constitutional: She is oriented to person, place, and time. She appears well-developed and well-nourished.  HENT:  Head: Normocephalic and atraumatic.  Eyes: Conjunctivae and EOM are normal. Pupils are equal, round, and reactive to light.  Neck: Carotid bruit is not present.  Cardiovascular: Normal rate, regular rhythm, normal heart sounds and intact distal pulses.  Pulmonary/Chest: Effort normal and breath sounds normal.  Abdominal: Soft. She exhibits no pulsatile midline mass. There is no tenderness.  Musculoskeletal:  right ankle: warm to touch with soft tissue swelling, NVI distally. Slightly guarded motion of ankle; calf nontender  Neurological: She is alert and oriented to person, place, and time.  Skin: Skin is warm and dry.  Psychiatric: She has a normal mood and affect. Her behavior is normal.  Vitals reviewed.   Filed Vitals:   09/21/15 1303  BP: 137/78  Pulse: 85  Temp: 98.7 F (37.1 C)  TempSrc: Oral  Resp: 16  Height: 5' 3.5" (1.613 m)  Weight: 194 lb 3.2 oz (88.089 kg)   Results for orders placed or performed in visit on 09/21/15  POCT glucose (manual entry)  Result Value Ref Range   POC Glucose 150 (A) 70 - 99 mg/dl  POCT glycosylated hemoglobin (Hb A1C)  Result Value Ref Range   Hemoglobin A1C 7.4        Assessment & Plan:   Susan Miles is a 69 y.o. female Need for prophylactic vaccination and inoculation against influenza - Plan: Flu Vaccine QUAD 36+ mos IM given.   Gout of left foot, unspecified cause, unspecified chronicity,Acute gout of right ankle, unspecified cause - Plan: colchicine 0.6 MG tablet, Uric Acid, HYDROcodone-acetaminophen (NORCO/VICODIN) 5-325 MG tablet  - recurrent gout. Improved uric acid last visit, but still frequent  attacks.   -repeat uric acid, increase allopurinol to 255m qd, refilled colchicine for acute attacks, and hydrocodone if needed short term for breakthrough pain. SED.   Essential hypertension - Plan: amLODipine (NORVASC) 2.5 MG tablet, cloNIDine (CATAPRES) 0.2 MG tablet, metoprolol (TOPROL-XL) 200 MG 24 hr tablet, valsartan (DIOVAN) 320 MG tablet, Comprehensive metabolic panel, Lipid panel  -controlled. No med changes at this point.   Hyperlipidemia - Plan: atorvastatin (LIPITOR) 20 MG tablet, Comprehensive metabolic panel, Lipid panel  Lipids pending. Continue Lipitor, but hold for few days if taking colchicine to lessen risk of myopathy.   Allergic rhinitis, unspecified allergic rhinitis type - Plan: cetirizine (ZYRTEC) 10 MG tablet  -stable on Zyrtec.   Need for prophylactic vaccination against Streptococcus pneumoniae (pneumococcus) - Plan: Pneumococcal polysaccharide vaccine 23-valent greater than or equal to 2yo subcutaneous/IM  -s/p Prevnar last year, Pneumovax given today.   Controlled type 2 diabetes mellitus without complication, without long-term current use of insulin ([place] - Plan: POCT glucose (manual entry), POCT glycosylated hemoglobin (Hb A1C), glipiZIDE (GLUCOTROL) 10 MG tablet, metFORMIN (GLUCOPHAGE) 1000 MG tablet, sitaGLIPtin (JANUVIA) 100 MG tablet  -borderline control, but commended on recent weight loss. Suspect this will continue to improve as she restarts exercise (once gout subsides), and pool based exercise option.   -discussed Invokana or other agent if Januvia cost prohibitive, but she can call once this is determined.   Recheck in 3 months.   Meds ordered this encounter  Medications  . allopurinol (ZYLOPRIM) 100 MG tablet    Sig: Take 2 tablets (200 mg total) by mouth daily.    Dispense:  60 tablet    Refill:  5  . colchicine 0.6 MG tablet    Sig: 2 tablets once, then 1 tablet in 1 hour if needed for pain. No further doses per attack.    Dispense:  30  tablet    Refill:  0  . amLODipine (NORVASC) 2.5 MG tablet    Sig: Take 1 tablet (2.5 mg total) by mouth daily.    Dispense:  90 tablet  Refill:  1  . atorvastatin (LIPITOR) 20 MG tablet    Sig: Take 1 tablet (20 mg total) by mouth daily at 6 PM.    Dispense:  90 tablet    Refill:  1  . cloNIDine (CATAPRES) 0.2 MG tablet    Sig: TAKE 1 TABLET (0.2 MG TOTAL) BY MOUTH 2 TIMES DAILY    Dispense:  180 tablet    Refill:  1  . cetirizine (ZYRTEC) 10 MG tablet    Sig: Take 1 tablet (10 mg total) by mouth daily.    Dispense:  90 tablet    Refill:  1  . metoprolol (TOPROL-XL) 200 MG 24 hr tablet    Sig: Take 1 tablet (200 mg total) by mouth daily.    Dispense:  90 tablet    Refill:  1  . valsartan (DIOVAN) 320 MG tablet    Sig: Take 1 tablet (320 mg total) by mouth daily.    Dispense:  90 tablet    Refill:  1  . HYDROcodone-acetaminophen (NORCO/VICODIN) 5-325 MG tablet    Sig: Take 1 tablet by mouth every 6 (six) hours as needed for moderate pain.    Dispense:  15 tablet    Refill:  0  . glipiZIDE (GLUCOTROL) 10 MG tablet    Sig: Take 2 tablets (20 mg total) by mouth 2 (two) times daily before a meal.    Dispense:  360 tablet    Refill:  1  . metFORMIN (GLUCOPHAGE) 1000 MG tablet    Sig: Take 1 tablet (1,000 mg total) by mouth 2 (two) times daily with a meal.    Dispense:  180 tablet    Refill:  1  . sitaGLIPtin (JANUVIA) 100 MG tablet    Sig: TAKE 1 TABLET BY MOUTH EVERY DAY    Dispense:  90 tablet    Refill:  1   Patient Instructions  Increase allopurinol to 2 pills per day. Colchicine up to 3 pills per attack (do not take atorvastatin for a few days if taking colchicine). If needed for breakthrough pain - can use hydrocodone, but be careful with side effects of this medicine as we discussed.   Blood pressure and weight are improving - keep up the good work!  You should receive a call or letter about your lab results within the next week to 10 days.   Still borderline  control of diabetes.  Should be controlled once exercise resumes. If Januvia too costly, let me know. Another option may be Invokana - you can ask the pharmacist if this would be less costly for you.   Follow up in 3 months.      By signing my name below, I, Moises Blood, attest that this documentation has been prepared under the direction and in the presence of Susan Ray, MD. Electronically Signed: Moises Blood, [place]. 09/21/2015 , 1:27 PM .  I personally performed the services described in this documentation, which was scribed in my presence. The recorded information has been reviewed and considered, and addended by me as needed.

## 2015-09-22 ENCOUNTER — Other Ambulatory Visit: Payer: Self-pay | Admitting: Family Medicine

## 2015-09-28 ENCOUNTER — Encounter: Payer: Self-pay | Admitting: Family Medicine

## 2015-11-19 ENCOUNTER — Other Ambulatory Visit: Payer: Self-pay | Admitting: Physician Assistant

## 2015-12-14 ENCOUNTER — Encounter: Payer: Self-pay | Admitting: Family Medicine

## 2015-12-16 ENCOUNTER — Other Ambulatory Visit: Payer: Self-pay | Admitting: Family Medicine

## 2015-12-28 ENCOUNTER — Encounter: Payer: Self-pay | Admitting: Family Medicine

## 2015-12-28 ENCOUNTER — Telehealth: Payer: Self-pay | Admitting: *Deleted

## 2015-12-28 ENCOUNTER — Ambulatory Visit (INDEPENDENT_AMBULATORY_CARE_PROVIDER_SITE_OTHER): Payer: Commercial Managed Care - HMO | Admitting: Family Medicine

## 2015-12-28 VITALS — BP 130/72 | HR 78 | Temp 98.3°F | Resp 16 | Ht 63.75 in | Wt 200.2 lb

## 2015-12-28 DIAGNOSIS — M1000 Idiopathic gout, unspecified site: Secondary | ICD-10-CM | POA: Diagnosis not present

## 2015-12-28 DIAGNOSIS — IMO0001 Reserved for inherently not codable concepts without codable children: Secondary | ICD-10-CM

## 2015-12-28 DIAGNOSIS — E785 Hyperlipidemia, unspecified: Secondary | ICD-10-CM | POA: Diagnosis not present

## 2015-12-28 DIAGNOSIS — I10 Essential (primary) hypertension: Secondary | ICD-10-CM

## 2015-12-28 DIAGNOSIS — M109 Gout, unspecified: Secondary | ICD-10-CM

## 2015-12-28 DIAGNOSIS — M1009 Idiopathic gout, multiple sites: Secondary | ICD-10-CM

## 2015-12-28 DIAGNOSIS — M10072 Idiopathic gout, left ankle and foot: Secondary | ICD-10-CM | POA: Diagnosis not present

## 2015-12-28 DIAGNOSIS — E1165 Type 2 diabetes mellitus with hyperglycemia: Secondary | ICD-10-CM | POA: Diagnosis not present

## 2015-12-28 LAB — COMPLETE METABOLIC PANEL WITH GFR
ALBUMIN: 3.8 g/dL (ref 3.6–5.1)
ALK PHOS: 51 U/L (ref 33–130)
ALT: 16 U/L (ref 6–29)
AST: 14 U/L (ref 10–35)
BUN: 20 mg/dL (ref 7–25)
CALCIUM: 10.3 mg/dL (ref 8.6–10.4)
CO2: 25 mmol/L (ref 20–31)
Chloride: 99 mmol/L (ref 98–110)
Creat: 1.1 mg/dL — ABNORMAL HIGH (ref 0.50–0.99)
GFR, EST AFRICAN AMERICAN: 59 mL/min — AB (ref >=60)
GFR, EST NON AFRICAN AMERICAN: 51 mL/min — AB (ref >=60)
Glucose, Bld: 220 mg/dL — ABNORMAL HIGH (ref 65–99)
POTASSIUM: 3.9 mmol/L (ref 3.5–5.3)
Sodium: 138 mmol/L (ref 135–146)
Total Bilirubin: 0.4 mg/dL (ref 0.2–1.2)
Total Protein: 6.6 g/dL (ref 6.1–8.1)

## 2015-12-28 LAB — URIC ACID: Uric Acid, Serum: 5.2 mg/dL (ref 2.4–7.0)

## 2015-12-28 LAB — POCT GLYCOSYLATED HEMOGLOBIN (HGB A1C): Hemoglobin A1C: 8.3

## 2015-12-28 LAB — GLUCOSE, POCT (MANUAL RESULT ENTRY): POC Glucose: 257 mg/dl — AB (ref 70–99)

## 2015-12-28 MED ORDER — CLONIDINE HCL 0.2 MG PO TABS: ORAL_TABLET | ORAL | Status: DC

## 2015-12-28 MED ORDER — COLCHICINE 0.6 MG PO TABS: ORAL_TABLET | ORAL | Status: DC

## 2015-12-28 MED ORDER — METOPROLOL SUCCINATE ER 200 MG PO TB24: ORAL_TABLET | ORAL | Status: DC

## 2015-12-28 MED ORDER — AMLODIPINE BESYLATE 2.5 MG PO TABS: 2.5000 mg | ORAL_TABLET | Freq: Every day | ORAL | Status: DC

## 2015-12-28 MED ORDER — ALLOPURINOL 100 MG PO TABS: 200.0000 mg | ORAL_TABLET | Freq: Every day | ORAL | Status: DC

## 2015-12-28 MED ORDER — VALSARTAN 320 MG PO TABS: 320.0000 mg | ORAL_TABLET | Freq: Every day | ORAL | Status: DC

## 2015-12-28 MED ORDER — ATORVASTATIN CALCIUM 20 MG PO TABS: 20.0000 mg | ORAL_TABLET | Freq: Every day | ORAL | Status: DC

## 2015-12-28 NOTE — Telephone Encounter (Signed)
AVS mailed to pt.

## 2015-12-28 NOTE — Patient Instructions (Signed)
Your diabetes levels are worse, so I will look at your medicines and determine next step.  Will call you about any changes in next few days. Would also like to see other lab results. Other meds refilled. Follow up in next 3 months.  Increase exercise to most days of the week.

## 2015-12-28 NOTE — Progress Notes (Signed)
Subjective:  By signing my name below, I, Susan Miles, attest that this documentation has been prepared under the direction and in the presence of [name], MD.  Electronically Signed: Thea Alken, ED Scribe. 12/28/2015. 8:13 AM.   Patient ID: Bevely Miles, female    DOB: 28-Mar-1946, 70 y.o.   MRN: [id number]  HPI   Chief Complaint  Patient presents with  . Follow-up    3 mos  . Diabetes  . Medication Refill    all meds  . left ankle swelling    12/27/15 am, Colchicine helped    HPI Comments: Susan Miles is a 70 y.o. female who presents to the [facility] for follow up.   Diabetes Lab Results  Component Value Date   HGBA1C 7.4 09/21/2015   Borderline controlled last visit. Planned on weight loss, once able to exercise on gout improvement,  Wt Readings from Last 3 Encounters:  12/28/15 200 lb 3.2 oz (90.81 kg)  09/21/15 194 lb 3.2 oz (88.089 kg)  06/15/15 200 lb 12.8 oz (91.082 kg)   Currently taking glucotrol 89m bid, metformin 1000 mg bid, januvia 100 mg qd,  Pt states her blood sugars home readings from 135-189. She does some light exercises at home about 2-3 days a week. Pt has noticed her weight has increased. She has an appointment with optho next month. Recently seen by dentist last week.   Hyperlipidemia   Lipitor 20 mg qd  Lab Results  Component Value Date   CHOL 136 09/21/2015   HDL 39* 09/21/2015   LDLCALC 79 09/21/2015   TRIG 91 09/21/2015   CHOLHDL 3.5 09/21/2015   Lab Results  Component Value Date   ALT 13 09/21/2015   AST 11 09/21/2015   ALKPHOS 56 09/21/2015   BILITOT 0.5 09/21/2015   Pt is compliant with Liptior without adverse effects.   Gout  Last Uric acid was 7.9 in October. 5 flare ups in that past 6 months. Increased allopurinol to 200 mg qd. Colchicine acute attacks. Hydrocodone for break through pain.   Since last visit she has had 1 gout flare up, which was yesterday to left ankle. She took colchicine  last night with improvement to swelling and pain. She has also been taking allopurinol as prescribed without adverse effects.  Hypertension Controlled last visit. Kidney function borderline  Lab Results  Component Value Date   CREATININE 1.18* 09/21/2015   Her BP home reading range from 120/88-90. She does not take tylenol, advil or ibuprofen regularly. She denies CP, Lightheadedness and dizziness.   Hypercalcemia Dr BChalmers Caterfor hypercalcemia. She was unable to make her f/u appointment do to insurance issues.   Patient Active Problem List   Diagnosis Date Noted  . Breast asymmetry 05/01/2015  . HTN (hypertension) 02/10/2012  . DM[place]diabetes [place] 2[place] 02/10[place]   No past medical history on file. No past surgical history on file. Allergies  Allergen Reactions  . Ace Inhibitors Cough   Prior to Admission medications   Medication Sig Start Date End Date Taking? Authorizing Provider  allopurinol (ZYLOPRIM) 100 MG tablet Take 2 tablets (200 mg total) by mouth daily. 09/21/15  Yes JWendie Agreste MD  amLODipine (NORVASC) 2.5 MG tablet Take 1 tablet (2.5 mg total) by mouth daily. 09/21/15  Yes JWendie Agreste MD  aspirin 81 MG tablet Take 81 mg by mouth daily.   Yes Historical Provider, MD  atorvastatin (LIPITOR) 20 MG tablet Take 1  tablet (20 mg total) by mouth daily at 6 PM. 09/21/15  Yes [name], MD  Blood Glucose Monitoring Suppl (BLOOD GLUCOSE METER KIT AND SUPPLIES) KIT One touch ultra mini monitor to check blood sugars once daily 10/27/14  Yes [name], MD  cetirizine (ZYRTEC) 10 MG tablet Take 1 tablet (10 mg total) by mouth daily. 09/21/15  Yes [name], MD  cloNIDine (CATAPRES) 0.2 MG tablet TAKE 1 TABLET (0.2 MG TOTAL) BY MOUTH 2 TIMES DAILY 09/21/15  Yes [name], MD  colchicine 0.6 MG tablet 2 tablets once, then 1 tablet in 1 hour if needed for pain. No further doses per attack. 09/21/15  Yes [name], MD  glipiZIDE  (GLUCOTROL) 10 MG tablet Take 2 tablets (20 mg total) by mouth 2 (two) times daily before a meal. 09/21/15  Yes [name], MD  glucose blood test strip Use to test blood sugars daily. Dx code 250.00. 10/27/14  Yes [name], MD  HYDROcodone-acetaminophen (NORCO/VICODIN) 5-325 MG tablet Take 1 tablet by mouth every 6 (six) hours as needed for moderate pain. 09/21/15  Yes [name], MD  Lancets [facility] ULTRASOFT) lancets One touch ultra mini to check blood sugar daily dx code E11.9 10/27/14  Yes [name], MD  metFORMIN (GLUCOPHAGE) 1000 MG tablet Take 1 tablet (1,000 mg total) by mouth 2 (two) times daily with a meal. 09/21/15  Yes [name], MD  metoprolol (TOPROL-XL) 200 MG 24 hr tablet TAKE 1 TABLET (200 MG TOTAL) BY MOUTH DAILY. 12/16/15  Yes [name], MD  valsartan (DIOVAN) 320 MG tablet Take 1 tablet (320 mg total) by mouth daily. 09/21/15  Yes [name], MD  sitaGLIPtin (JANUVIA) 100 MG tablet TAKE 1 TABLET BY MOUTH EVERY DAY 09/21/15   [name], MD   Social History   Social History  . Marital Status: Married    Spouse Name: N/A  . Number of Children: N/A  . Years of Education: N/A   Occupational History  . Not on file.   Social History Main Topics  . Smoking status: Never Smoker   . Smokeless tobacco: Never Used  . Alcohol Use: Not on file  . Drug Use: Not on file  . Sexual Activity: Not on file   Other Topics Concern  . Not on file   Social History Narrative   Review of Systems  Constitutional: Negative for fatigue and unexpected weight change.  Respiratory: Negative for chest tightness and shortness of breath.   Cardiovascular: Negative for chest pain, palpitations and leg swelling.  Gastrointestinal: Negative for abdominal pain and blood in stool.  Musculoskeletal: Positive for arthralgias.  Neurological: Negative for dizziness, syncope, light-headedness and headaches.    Objective:   Physical Exam    Constitutional: She is oriented to person, place, and time. She appears well-developed and well-nourished.  HENT:  Head: Normocephalic and atraumatic.  Eyes: Conjunctivae and EOM are normal. Pupils are equal, round, and reactive to light.  Neck: Carotid bruit is not present.  Cardiovascular: Normal rate, regular rhythm, normal heart sounds and intact distal pulses.   Pulmonary/Chest: Effort normal and breath sounds normal.  Abdominal: Soft. She exhibits no pulsatile midline mass. There is no tenderness.  Musculoskeletal:  Tender along posterior calcaneous. skin is intact NVI distally  Neurological: She is alert and oriented to person, place, and time.  Skin: Skin is warm and dry.  Psychiatric: She has a normal mood and  affect. Her behavior is normal.  Vitals reviewed.   Filed Vitals:   12/28/15 0807  BP: 130/72  Pulse: 78  Temp: 98.3 F (36.8 C)  TempSrc: Oral  Resp: 16  Height: 5' 3.75" (1.619 m)  Weight: 200 lb 3.2 oz (90.81 kg)  SpO2: 97%    Results for orders placed or performed in visit on 12/28/15  POCT glucose (manual entry)  Result Value Ref Range   POC Glucose 257 (A) 70 - 99 mg/dl  POCT glycosylated hemoglobin (Hb A1C)  Result Value Ref Range   Hemoglobin A1C 8.3     Assessment & Plan:   Susan Miles is a 70 y.o. female Uncontrolled type 2 diabetes mellitus without complication, without long-term current use of insulin ([place]) - Plan: POCT glucose (manual entry), POCT glycosylated hemoglobin (Hb A1C), HM Diabetes Foot Exam  -Not at control, and unfortunately higher than last visit. Currently max dose of metformin, glipizide twice a day, and Januvia. Januvia is most expensive for her now, so will look at other options, possibly exchanging Januvia for other option that may be less costly and improved glycemic control. We'll need to check to see her coverage.  Gouty arthritis - Plan: Uric Acid  -Continue allopurinol at same dose, uric acid level pending. Has  colchicine if needed for breakthrough symptoms, and hydrocodone if needed.  Essential hypertension - Plan: COMPLETE METABOLIC PANEL WITH GFR, valsartan (DIOVAN) 320 MG tablet, metoprolol (TOPROL-XL) 200 MG 24 hr tablet, cloNIDine (CATAPRES) 0.2 MG tablet, amLODipine (NORVASC) 2.5 MG tablet  -Stable. No regimen changes for now.  Hyperlipidemia - Plan: COMPLETE METABOLIC PANEL WITH GFR, atorvastatin (LIPITOR) 20 MG tablet  Continue Lipitor 20 mg daily, CMP pending.  Hypercalcemia - Plan: COMPLETE METABOLIC PANEL WITH GFR  - CMP pending.  Acute gout of left foot, unspecified cause - Plan: colchicine 0.6 MG tablet Gout of left foot, unspecified cause, unspecified chronicity - Plan: allopurinol (ZYLOPRIM) 100 MG tablet  - As above, continue allopurinol, uric acid level pending. Colchicine,  hydrocodone if needed.  Meds ordered this encounter  Medications  . valsartan (DIOVAN) 320 MG tablet    Sig: Take 1 tablet (320 mg total) by mouth daily.    Dispense:  90 tablet    Refill:  1  . metoprolol (TOPROL-XL) 200 MG 24 hr tablet    Sig: TAKE 1 TABLET (200 MG TOTAL) BY MOUTH DAILY.    Dispense:  90 tablet    Refill:  1  . colchicine 0.6 MG tablet    Sig: 2 tablets once, then 1 tablet in 1 hour if needed for pain. No further doses per attack.    Dispense:  30 tablet    Refill:  0  . cloNIDine (CATAPRES) 0.2 MG tablet    Sig: TAKE 1 TABLET (0.2 MG TOTAL) BY MOUTH 2 TIMES DAILY    Dispense:  180 tablet    Refill:  1  . atorvastatin (LIPITOR) 20 MG tablet    Sig: Take 1 tablet (20 mg total) by mouth daily at 6 PM.    Dispense:  90 tablet    Refill:  1  . amLODipine (NORVASC) 2.5 MG tablet    Sig: Take 1 tablet (2.5 mg total) by mouth daily.    Dispense:  90 tablet    Refill:  1  . allopurinol (ZYLOPRIM) 100 MG tablet    Sig: Take 2 tablets (200 mg total) by mouth daily.    Dispense:  60 tablet  Refill:  5   Patient Instructions  Your diabetes levels are worse, so I will look at your  medicines and determine next step.  Will call you about any changes in next few days. Would also like to see other lab results. Other meds refilled. Follow up in next 3 months.  Increase exercise to most days of the week.       I personally performed the services described in this documentation, which was scribed in my presence. The recorded information has been reviewed and considered, and addended by me as needed.

## 2015-12-30 ENCOUNTER — Other Ambulatory Visit: Payer: Self-pay | Admitting: Family Medicine

## 2016-01-08 ENCOUNTER — Other Ambulatory Visit: Payer: Self-pay | Admitting: Family Medicine

## 2016-01-08 NOTE — Telephone Encounter (Signed)
Pt states the rx valsartan (DIOVAN) 320 MG tablet [id number]  She picked up doesn't list HCTZ which she has always been list on her Rx... Please call to consult to confirm that pt should take rx as dispense.  [phone]//[phone] VM okay

## 2016-01-09 NOTE — Telephone Encounter (Signed)
Spoke with patient advised Hctz was removed due to gout continue on medication without if bp 140/90 call we may need to change regiment.  Patient understood.

## 2016-01-09 NOTE — Telephone Encounter (Signed)
She had been on the combo in the past, but I think the HCTZ was removed due to her gout. HCTZ can worsen gout. Her blood pressure was controlled last office visit, and at that visit I had listed that she was just on the Diovan itself. Would try to just take the Diovan itself without HCTZ, monitor blood pressures, and if remaining over 140/90, may need to change regimen. We know she has questions.

## 2016-01-19 ENCOUNTER — Encounter: Payer: Self-pay | Admitting: *Deleted

## 2016-02-04 ENCOUNTER — Other Ambulatory Visit: Payer: Self-pay | Admitting: Family Medicine

## 2016-03-18 ENCOUNTER — Other Ambulatory Visit: Payer: Self-pay | Admitting: Family Medicine

## 2016-03-23 ENCOUNTER — Encounter: Payer: Self-pay | Admitting: Family Medicine

## 2016-03-23 ENCOUNTER — Ambulatory Visit (INDEPENDENT_AMBULATORY_CARE_PROVIDER_SITE_OTHER): Payer: Commercial Managed Care - HMO | Admitting: Family Medicine

## 2016-03-23 VITALS — BP 140/90 | HR 78 | Temp 98.3°F | Resp 16 | Ht 63.5 in | Wt 202.2 lb

## 2016-03-23 DIAGNOSIS — I10 Essential (primary) hypertension: Secondary | ICD-10-CM

## 2016-03-23 DIAGNOSIS — E1165 Type 2 diabetes mellitus with hyperglycemia: Secondary | ICD-10-CM | POA: Diagnosis not present

## 2016-03-23 DIAGNOSIS — IMO0001 Reserved for inherently not codable concepts without codable children: Secondary | ICD-10-CM

## 2016-03-23 DIAGNOSIS — Z13220 Encounter for screening for lipoid disorders: Secondary | ICD-10-CM | POA: Diagnosis not present

## 2016-03-23 DIAGNOSIS — E119 Type 2 diabetes mellitus without complications: Secondary | ICD-10-CM | POA: Diagnosis not present

## 2016-03-23 DIAGNOSIS — H524 Presbyopia: Secondary | ICD-10-CM | POA: Diagnosis not present

## 2016-03-23 DIAGNOSIS — H5210 Myopia, unspecified eye: Secondary | ICD-10-CM | POA: Diagnosis not present

## 2016-03-23 LAB — COMPLETE METABOLIC PANEL WITH GFR
ALK PHOS: 54 U/L (ref 33–130)
ALT: 14 U/L (ref 6–29)
AST: 14 U/L (ref 10–35)
Albumin: 4 g/dL (ref 3.6–5.1)
BUN: 15 mg/dL (ref 7–25)
CO2: 23 mmol/L (ref 20–31)
Calcium: 9.8 mg/dL (ref 8.6–10.4)
Chloride: 106 mmol/L (ref 98–110)
Creat: 1.05 mg/dL — ABNORMAL HIGH (ref 0.50–0.99)
GFR, EST AFRICAN AMERICAN: 63 mL/min (ref >=60)
GFR, EST NON AFRICAN AMERICAN: 54 mL/min — AB (ref >=60)
GLUCOSE: 160 mg/dL — AB (ref 65–99)
POTASSIUM: 4.2 mmol/L (ref 3.5–5.3)
SODIUM: 138 mmol/L (ref 135–146)
Total Bilirubin: 0.4 mg/dL (ref 0.2–1.2)
Total Protein: 6.9 g/dL (ref 6.1–8.1)

## 2016-03-23 LAB — MICROALBUMIN, URINE: MICROALB UR: 7.5 mg/dL

## 2016-03-23 LAB — GLUCOSE, POCT (MANUAL RESULT ENTRY): POC Glucose: 187 mg/dl — AB (ref 70–99)

## 2016-03-23 LAB — LIPID PANEL
CHOL/HDL RATIO: 3.2 ratio (ref <=5.0)
Cholesterol: 130 mg/dL (ref 125–200)
HDL: 41 mg/dL — AB (ref >=46)
LDL CALC: 77 mg/dL (ref <130)
TRIGLYCERIDES: 59 mg/dL (ref <150)
VLDL: 12 mg/dL (ref <30)

## 2016-03-23 LAB — POCT GLYCOSYLATED HEMOGLOBIN (HGB A1C): HEMOGLOBIN A1C: 7.4

## 2016-03-23 MED ORDER — AMLODIPINE BESYLATE 5 MG PO TABS: 5.0000 mg | ORAL_TABLET | Freq: Every day | ORAL | Status: DC

## 2016-03-23 NOTE — Patient Instructions (Addendum)
     IF you received an x-ray today, you will receive an invoice from [facility]. Please contact [facility] at [phone] with questions or concerns regarding your invoice.   IF you received labwork today, you will receive an invoice from [employer]. Please contact [facility] at [phone] with questions or concerns regarding your invoice.   Our billing staff will not be able to assist you with questions regarding bills from these companies.  You will be contacted with the lab results as soon as they are available. The fastest way to get your results is to activate your My Chart account. Instructions are located on the last page of this paperwork. If you have not heard from [place] regarding the results in 2 weeks, please contact this office.    Your diabetes is improving. We can continue same meds for now and recheck in 3 months. At that time can look into lower cost options than Januvia prior to the fall.   Increase amlodipine to 5mg  each day. Keep a record of your blood pressures outside of the office and bring them to the next office visit.

## 2016-03-23 NOTE — Progress Notes (Signed)
By signing my name below, I, Susan Miles, attest that this documentation has been prepared under the direction and in the presence of Corliss Parish, MD.  Electronically Signed: [name], Medical Scribe. 03/23/2016. 9:50 AM.  Subjective:    Patient ID: Susan Miles, female    DOB: 1946-12-03, 70 y.o.   MRN: [id number]  HPI Chief Complaint  Patient presents with  . Follow-up    3 mos  . Diabetes  . Hypertension  . Medication Refill    "all meds"   HPI Comments: Susan Miles is a 70 y.o. female with a hx of HTN, and DM2 who presents to the [facility] for a follow-up of multiple medical problem.   Diabetes: She was uncontrolled at last visit. There was some concern with cost of medications was continued on same medications at that visit with possible other medications besides Januvia. She's still taking Januvia. She excercises about 3-4 times a week with a steady weight. She hasn't been dieting, but cooking better. She denies sodas, but does eat fastfood. She hasn't had any complications with her diaebtes. Opto visit of April 27th. Dentist in March Wt Readings from Last 3 Encounters:  03/23/16 202 lb 3.2 oz (91.717 kg)  12/28/15 200 lb 3.2 oz (90.81 kg)  09/21/15 194 lb 3.2 oz (88.089 kg)   Lab Results  Component Value Date   MICROALBUR 0.4 03/02/2015   Lab Results  Component Value Date   HGBA1C 7.4 03/23/2016   HTN: Stable last visit, borderline today. She mentions her bp has been running high. The highest it ran at home was 169/91. Previously HCTZ for HTN was discontinued because of her gout. Lab Results  Component Value Date   CREATININE 1.10* 12/28/2015   Hyperlipidemia: Lab Results  Component Value Date   CHOL 136 09/21/2015   HDL 39* 09/21/2015   LDLCALC 79 09/21/2015   TRIG 91 09/21/2015   CHOLHDL 3.5 09/21/2015   Lab Results  Component Value Date   ALT 16 12/28/2015   AST 14 12/28/2015   ALKPHOS 51 12/28/2015   BILITOT 0.4  12/28/2015  On Lipitor 20 mg daily.   Gout: Last uric acid was 5.2. She takes allopurinol 200 mg per day. She has colchicine if needed. She hasn't been flaring up since last visit.  Hypercalcemia: Followed by Dr. Suzette Battiest   Patient Active Problem List   Diagnosis Date Noted  . Breast asymmetry 05/01/2015  . Cataract, nuclear 05/24/2012  . HTN (hypertension) 02/10/2012  . DM2 (diabetes mellitus, type 2) ([place]) 02/10/2012   No past medical history on file. No past surgical history on file. Allergies  Allergen Reactions  . Ace Inhibitors Cough   Prior to Admission medications   Medication Sig Start Date End Date Taking? Authorizing Provider  ACCU-CHEK AVIVA PLUS test strip CHECK BLOOD GLUCOSE ONCE EVERY DAY 02/04/16  Yes [name], MD  allopurinol (ZYLOPRIM) 100 MG tablet Take 2 tablets (200 mg total) by mouth daily. 12/28/15  Yes [name], MD  amLODipine (NORVASC) 2.5 MG tablet Take 1 tablet (2.5 mg total) by mouth daily. 12/28/15  Yes [name], MD  aspirin 81 MG tablet Take 81 mg by mouth daily.   Yes Historical Provider, MD  atorvastatin (LIPITOR) 20 MG tablet Take 1 tablet (20 mg total) by mouth daily at 6 PM. 12/28/15  Yes [name], MD  Blood Glucose Monitoring Suppl (BLOOD GLUCOSE METER KIT AND SUPPLIES) KIT One touch ultra  mini monitor to check blood sugars once daily 10/27/14  Yes [name], MD  cetirizine (ZYRTEC) 10 MG tablet Take 1 tablet (10 mg total) by mouth daily. 09/21/15  Yes [name], MD  cloNIDine (CATAPRES) 0.2 MG tablet TAKE 1 TABLET (0.2 MG TOTAL) BY MOUTH 2 TIMES DAILY 12/28/15  Yes [name], MD  colchicine 0.6 MG tablet 2 tablets once, then 1 tablet in 1 hour if needed for pain. No further doses per attack. 12/28/15  Yes [name], MD  glipiZIDE (GLUCOTROL) 10 MG tablet TAKE 2 TABLETS (20 MG TOTAL) BY MOUTH 2 (TWO) TIMES DAILY BEFORE A MEAL. 03/21/16  Yes [name], MD  HYDROcodone-acetaminophen  (NORCO/VICODIN) 5-325 MG tablet Take 1 tablet by mouth every 6 (six) hours as needed for moderate pain. 09/21/15  Yes [name], MD  Lancets [facility] ULTRASOFT) lancets One touch ultra mini to check blood sugar daily dx code E11.9 10/27/14  Yes [name], MD  metFORMIN (GLUCOPHAGE) 1000 MG tablet Take 1 tablet (1,000 mg total) by mouth 2 (two) times daily with a meal. 09/21/15  Yes [name], MD  metoprolol (TOPROL-XL) 200 MG 24 hr tablet TAKE 1 TABLET (200 MG TOTAL) BY MOUTH DAILY. 12/28/15  Yes [name], MD  sitaGLIPtin (JANUVIA) 100 MG tablet TAKE 1 TABLET BY MOUTH EVERY DAY 09/21/15  Yes [name], MD  valsartan (DIOVAN) 320 MG tablet Take 1 tablet (320 mg total) by mouth daily. 12/28/15  Yes [name], MD   Social History   Social History  . Marital Status: Married    Spouse Name: N/A  . Number of Children: N/A  . Years of Education: N/A   Occupational History  . Not on file.   Social History Main Topics  . Smoking status: Never Smoker   . Smokeless tobacco: Never Used  . Alcohol Use: Not on file  . Drug Use: Not on file  . Sexual Activity: Not on file   Other Topics Concern  . Not on file   Social History Narrative   Review of Systems  Constitutional: Negative for fatigue and unexpected weight change.  Respiratory: Negative for chest tightness and shortness of breath.   Cardiovascular: Negative for chest pain, palpitations and leg swelling.  Gastrointestinal: Negative for abdominal pain and blood in stool.  Neurological: Negative for dizziness, syncope, light-headedness and headaches.     Objective:   Physical Exam  Constitutional: She is oriented to person, place, and time. She appears well-developed and well-nourished.  HENT:  Head: Normocephalic and atraumatic.  Eyes: Conjunctivae and EOM are normal. Pupils are equal, round, and reactive to light.  Neck: Carotid bruit is not present.  Cardiovascular: Normal rate, regular  rhythm, normal heart sounds and intact distal pulses.   Pulmonary/Chest: Effort normal and breath sounds normal.  Abdominal: Soft. She exhibits no pulsatile midline mass. There is no tenderness.  Neurological: She is alert and oriented to person, place, and time.  Skin: Skin is warm and dry.  Psychiatric: She has a normal mood and affect. Her behavior is normal.  Vitals reviewed.  Results for orders placed or performed in visit on 03/23/16  POCT glucose (manual entry)  Result Value Ref Range   POC Glucose 187 (A) 70 - 99 mg/dl  POCT glycosylated hemoglobin (Hb A1C)  Result Value Ref Range   Hemoglobin A1C 7.4       Assessment & Plan:   ANNYA LIZANA is  a 70 y.o. female Essential hypertension - Plan: amLODipine (NORVASC) 5 MG tablet  -Above goal. We'll increase Norvasc to 5 mg daily, side effects discussed, RTC precautions.  Uncontrolled type 2 diabetes mellitus without complication, without long-term current use of insulin ([place]) - Plan: POCT glucose (manual entry), POCT glycosylated hemoglobin (Hb A1C), Microalbumin, urine  - Improving, near goal. With continued diet and exercise changes, may be below 7 with current regimen. Januvia may be more costly later this year, so may need to look at other agent. Recheck in 3 months.  Screening for hyperlipidemia - Plan: COMPLETE METABOLIC PANEL WITH GFR, Lipid panel  -See Lipitor 20 mg daily for now.   Gout  -controlled. No change in regimen.  Meds ordered this encounter  Medications  . amLODipine (NORVASC) 5 MG tablet    Sig: Take 1 tablet (5 mg total) by mouth daily.    Dispense:  90 tablet    Refill:  0   Patient Instructions       IF you received an x-ray today, you will receive an invoice from [facility]. Please contact [facility] at [phone] with questions or concerns regarding your invoice.   IF you received labwork today, you will receive an invoice from [employer].  Please contact [facility] at [phone] with questions or concerns regarding your invoice.   Our billing staff will not be able to assist you with questions regarding bills from these companies.  You will be contacted with the lab results as soon as they are available. The fastest way to get your results is to activate your My Chart account. Instructions are located on the last page of this paperwork. If you have not heard from [place] regarding the results in 2 weeks, please contact this office.    Your diabetes is improving. We can continue same meds for now and recheck in 3 months. At that time can look into lower cost options than Januvia prior to the fall.   Increase amlodipine to 28m each day. Keep a record of your blood pressures outside of the office and bring them to the next office visit.     I personally performed the services described in this documentation, which was scribed in my presence. The recorded information has been reviewed and considered, and addended by me as needed.

## 2016-03-24 DIAGNOSIS — H524 Presbyopia: Secondary | ICD-10-CM | POA: Diagnosis not present

## 2016-03-24 DIAGNOSIS — H52223 Regular astigmatism, bilateral: Secondary | ICD-10-CM | POA: Diagnosis not present

## 2016-03-24 DIAGNOSIS — I10 Essential (primary) hypertension: Secondary | ICD-10-CM | POA: Diagnosis not present

## 2016-03-24 DIAGNOSIS — H5213 Myopia, bilateral: Secondary | ICD-10-CM | POA: Diagnosis not present

## 2016-03-24 DIAGNOSIS — H25099 Other age-related incipient cataract, unspecified eye: Secondary | ICD-10-CM | POA: Diagnosis not present

## 2016-03-24 DIAGNOSIS — E119 Type 2 diabetes mellitus without complications: Secondary | ICD-10-CM | POA: Diagnosis not present

## 2016-03-24 LAB — HM DIABETES EYE EXAM

## 2016-03-30 ENCOUNTER — Encounter: Payer: Self-pay | Admitting: Family Medicine

## 2016-04-09 ENCOUNTER — Ambulatory Visit (INDEPENDENT_AMBULATORY_CARE_PROVIDER_SITE_OTHER): Payer: Commercial Managed Care - HMO | Admitting: Family Medicine

## 2016-04-09 ENCOUNTER — Other Ambulatory Visit: Payer: Self-pay | Admitting: Family Medicine

## 2016-04-09 ENCOUNTER — Ambulatory Visit (INDEPENDENT_AMBULATORY_CARE_PROVIDER_SITE_OTHER): Payer: Commercial Managed Care - HMO

## 2016-04-09 VITALS — BP 118/80 | HR 85 | Temp 99.7°F | Resp 18 | Ht 63.5 in | Wt 201.0 lb

## 2016-04-09 DIAGNOSIS — S52125A Nondisplaced fracture of head of left radius, initial encounter for closed fracture: Secondary | ICD-10-CM | POA: Diagnosis not present

## 2016-04-09 DIAGNOSIS — M25522 Pain in left elbow: Secondary | ICD-10-CM

## 2016-04-09 DIAGNOSIS — S42402A Unspecified fracture of lower end of left humerus, initial encounter for closed fracture: Secondary | ICD-10-CM

## 2016-04-09 MED ORDER — HYDROCODONE-ACETAMINOPHEN 5-325 MG PO TABS: 1.0000 | ORAL_TABLET | Freq: Four times a day (QID) | ORAL | Status: DC | PRN

## 2016-04-09 NOTE — Patient Instructions (Addendum)
Please return in one week ( next Friday) for recheck.    IF you received an x-ray today, you will receive an invoice from [facility]. Please contact [facility] at [phone] with questions or concerns regarding your invoice.   IF you received labwork today, you will receive an invoice from [employer]. Please contact [facility] at [phone] with questions or concerns regarding your invoice.   Our billing staff will not be able to assist you with questions regarding bills from these companies.  You will be contacted with the lab results as soon as they are available. The fastest way to get your results is to activate your My Chart account. Instructions are located on the last page of this paperwork. If you have not heard from [place] regarding the results in 2 weeks, please contact this office.

## 2016-04-09 NOTE — Progress Notes (Addendum)
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  No other injury  Blood pressure adjustment recently by Dr. Carlota Raspberry  Objective:  BP 118/80 mmHg  Pulse 85  Temp(Src) 99.7 F (37.6 C)  Resp 18  Ht 5' 3.5" (1.613 m)  Wt 201 lb (91.173 kg)  BMI 35.04 kg/m2  SpO2 99% Left elbow:  Swollen lateral epicondyle, cannot supinate elbow, very tender epicondyle, nontender head of radius.   The left elbow film shows what appears to be a small crack in the lateral epicondyle and a possible radial head fracture.  Assessment: Contusion versus small fracture in the lateral upper condyle and elbow  Plan: Posterior splint for 1 week and return next Friday for recheck.  Norco for pain \ Left elbow pain - Plan: HYDROcodone-acetaminophen (NORCO) 5-325 MG tablet, CANCELED: DG Elbow Complete Right  Elbow fracture, left, closed, initial encounter  I elected to go with a posterior splint because patient was so tender over the lateral epicondyle and proximal radius.  A sugar tong may be possible in one week.  Signed, Carola Frost.D.

## 2016-04-15 ENCOUNTER — Ambulatory Visit (INDEPENDENT_AMBULATORY_CARE_PROVIDER_SITE_OTHER): Payer: Commercial Managed Care - HMO | Admitting: Physician Assistant

## 2016-04-15 ENCOUNTER — Ambulatory Visit (INDEPENDENT_AMBULATORY_CARE_PROVIDER_SITE_OTHER): Payer: Commercial Managed Care - HMO

## 2016-04-15 VITALS — BP 134/70 | HR 73 | Temp 98.0°F | Resp 16 | Ht 64.0 in | Wt 204.2 lb

## 2016-04-15 DIAGNOSIS — S52122D Displaced fracture of head of left radius, subsequent encounter for closed fracture with routine healing: Secondary | ICD-10-CM

## 2016-04-15 NOTE — Patient Instructions (Signed)
     IF you received an x-ray today, you will receive an invoice from [facility]. Please contact [facility] at [phone] with questions or concerns regarding your invoice.   IF you received labwork today, you will receive an invoice from [facility]. Please contact [facility] at [phone] with questions or concerns regarding your invoice.   Our billing staff will not be able to assist you with questions regarding bills from these companies.  You will be contacted with the lab results as soon as they are available. The fastest way to get your results is to activate your My Chart account. Instructions are located on the last page of this paperwork. If you have not heard from us regarding the results in 2 weeks, please contact this office.      

## 2016-04-15 NOTE — Progress Notes (Signed)
Chief Complaint  Patient presents with  . Follow-up    Left arm, in a cast    History of Present Illness: Patient presents for re-evaluation of a radial head fracture at the LEFT elbow.  She has been wearing a posterior splint and notes decreased pain in the past week. Tolerating hydrocodone for pain, but hasn't needed it in the past 2-3 days. RIGHT hand dominant.   Allergies  Allergen Reactions  . Ace Inhibitors Cough    Prior to Admission medications   Medication Sig Start Date End Date Taking? Authorizing Provider  ACCU-CHEK AVIVA PLUS test strip CHECK BLOOD GLUCOSE ONCE EVERY DAY 02/04/16  Yes [name], MD  allopurinol (ZYLOPRIM) 100 MG tablet Take 2 tablets (200 mg total) by mouth daily. 12/28/15  Yes [name], MD  amLODipine (NORVASC) 5 MG tablet Take 1 tablet (5 mg total) by mouth daily. 03/23/16  Yes [name], MD  aspirin 81 MG tablet Take 81 mg by mouth daily.   Yes Historical Provider, MD  atorvastatin (LIPITOR) 20 MG tablet Take 1 tablet (20 mg total) by mouth daily at 6 PM. 12/28/15  Yes [name], MD  Blood Glucose Monitoring Suppl (BLOOD GLUCOSE METER KIT AND SUPPLIES) KIT One touch ultra mini monitor to check blood sugars once daily 10/27/14  Yes [name], MD  cetirizine (ZYRTEC) 10 MG tablet Take 1 tablet (10 mg total) by mouth daily. 09/21/15  Yes [name], MD  cloNIDine (CATAPRES) 0.2 MG tablet TAKE 1 TABLET (0.2 MG TOTAL) BY MOUTH 2 TIMES DAILY 12/28/15  Yes [name], MD  colchicine 0.6 MG tablet 2 tablets once, then 1 tablet in 1 hour if needed for pain. No further doses per attack. 12/28/15  Yes [name], MD  glipiZIDE (GLUCOTROL) 10 MG tablet TAKE 2 TABLETS (20 MG TOTAL) BY MOUTH 2 (TWO) TIMES DAILY BEFORE A MEAL. 03/21/16  Yes [name], MD  HYDROcodone-acetaminophen (NORCO) 5-325 MG tablet Take 1 tablet by mouth every 6 (six) hours as needed for moderate pain. 04/09/16  Yes Robyn Haber, MD  Lancets  [facility] ULTRASOFT) lancets One touch ultra mini to check blood sugar daily dx code E11.9 10/27/14  Yes [name], MD  metFORMIN (GLUCOPHAGE) 1000 MG tablet Take 1 tablet (1,000 mg total) by mouth 2 (two) times daily with a meal. 09/21/15  Yes [name], MD  metoprolol (TOPROL-XL) 200 MG 24 hr tablet TAKE 1 TABLET (200 MG TOTAL) BY MOUTH DAILY. 12/28/15  Yes [name], MD  sitaGLIPtin (JANUVIA) 100 MG tablet TAKE 1 TABLET BY MOUTH EVERY DAY 09/21/15  Yes [name], MD  valsartan (DIOVAN) 320 MG tablet Take 1 tablet (320 mg total) by mouth daily. 12/28/15  Yes [name], MD    Patient Active Problem List   Diagnosis Date Noted  . Breast asymmetry 05/01/2015  . Cataract, nuclear 05/24/2012  . HTN (hypertension) 02/10/2012  . DM2 (diabetes mellitus, type 2) ([place]) 02/10/2012     Physical Exam  Constitutional: She is oriented to person, place, and time. She appears well-developed and well-nourished. She is active and cooperative. No distress.  BP 134/70 mmHg  Pulse 73  Temp(Src) 98 F (36.7 C) (Oral)  Resp 16  Ht 5' 4" (1.626 m)  Wt 204 lb 3.2 oz (92.625 kg)  BMI 35.03 kg/m2  SpO2 99%   Eyes: Conjunctivae are normal.  Pulmonary/Chest: Effort normal.  Musculoskeletal:  Left elbow: She exhibits swelling (mild, lateral epicondyle). She exhibits no effusion, no deformity and no laceration. Tenderness found. Radial head and lateral epicondyle tenderness noted. No medial epicondyle and no olecranon process tenderness noted.       Left wrist: Normal.       Left forearm: Normal.       Left hand: Normal. Normal sensation noted. Normal strength noted.  Neurological: She is alert and oriented to person, place, and time.  Psychiatric: She has a normal mood and affect. Her speech is normal and behavior is normal.    Dg Elbow 2 Views Left  04/15/2016  CLINICAL DATA:  Re-evaluate radial head fracture. Subsequent encounter. EXAM: LEFT ELBOW - 2 VIEW  COMPARISON:  04/09/2016 FINDINGS: Previously described proximal radius fracture is not visible today. A joint effusion has resolved. No displaced fracture or malalignment. Medial epicondyle enthesophyte. IMPRESSION: Resolved joint effusion with no visualized fracture today. Electronically Signed   By: Monte Fantasia M.D.   On: 04/15/2016 15:47      ASSESSMENT & PLAN:  1. Radial head fracture, left, closed, with routine healing, subsequent encounter Fracture not seen on films today. However, as there was a fracture seen 5/06, and because she remains tender at the site, maintain immobilization, now with sugar tong splint and sling. Reassess in 2 weeks, sooner if needed. - DG Elbow 2 Views Left; Future   Fara Chute, PA-C Physician Assistant-Certified [facility]

## 2016-04-18 ENCOUNTER — Other Ambulatory Visit: Payer: Self-pay | Admitting: Family Medicine

## 2016-04-29 ENCOUNTER — Ambulatory Visit (INDEPENDENT_AMBULATORY_CARE_PROVIDER_SITE_OTHER): Payer: Commercial Managed Care - HMO | Admitting: Physician Assistant

## 2016-04-29 ENCOUNTER — Ambulatory Visit: Payer: Commercial Managed Care - HMO

## 2016-04-29 VITALS — BP 126/80 | HR 75 | Temp 97.5°F | Resp 18 | Ht 64.0 in | Wt 203.0 lb

## 2016-04-29 DIAGNOSIS — S59901A Unspecified injury of right elbow, initial encounter: Secondary | ICD-10-CM | POA: Diagnosis not present

## 2016-04-29 DIAGNOSIS — S52122D Displaced fracture of head of left radius, subsequent encounter for closed fracture with routine healing: Secondary | ICD-10-CM | POA: Diagnosis not present

## 2016-04-29 NOTE — Progress Notes (Signed)
Chief Complaint  Patient presents with  . Elbow Injury    left side     History of Present Illness: Patient presents for re-evaluation of radial head fracture.  She initially presented on 04/09/2016 with elbow pain following running into a door frame trying to get to the telephone 2 days prior. Radiographs revealed a non-displaced radial head fracture and she wa placed in a posterior splint, with instructions to follow-up in 1 week.  I saw her on 5/12, at which time she reported significant improvement. She had minimal tenderness but pain with pronation/supination of the forearm. Repeat films revealed resolved joint effusion and no visualized fracture. Due to the persistent pain, she was placed in a sugar tong splint and advised to follow up again in one week.  Today she reports no pain at all.   Allergies  Allergen Reactions  . Ace Inhibitors Cough    Prior to Admission medications   Medication Sig Start Date End Date Taking? Authorizing Provider  ACCU-CHEK AVIVA PLUS test strip CHECK BLOOD GLUCOSE ONCE EVERY DAY 02/04/16   [name], MD  allopurinol (ZYLOPRIM) 100 MG tablet Take 2 tablets (200 mg total) by mouth daily. 12/28/15   [name], MD  amLODipine (NORVASC) 5 MG tablet Take 1 tablet (5 mg total) by mouth daily. 03/23/16   [name], MD  aspirin 81 MG tablet Take 81 mg by mouth daily.    Historical Provider, MD  atorvastatin (LIPITOR) 20 MG tablet Take 1 tablet (20 mg total) by mouth daily at 6 PM. 12/28/15   [name], MD  Blood Glucose Monitoring Suppl (BLOOD GLUCOSE METER KIT AND SUPPLIES) KIT One touch ultra mini monitor to check blood sugars once daily 10/27/14   [name], MD  cetirizine (ZYRTEC) 10 MG tablet Take 1 tablet (10 mg total) by mouth daily. 09/21/15   [name], MD  cloNIDine (CATAPRES) 0.2 MG tablet TAKE 1 TABLET (0.2 MG TOTAL) BY MOUTH 2 TIMES DAILY 12/28/15   [name], MD  colchicine 0.6 MG tablet 2 tablets once,  then 1 tablet in 1 hour if needed for pain. No further doses per attack. 12/28/15   [name], MD  glipiZIDE (GLUCOTROL) 10 MG tablet TAKE 2 TABLETS (20 MG TOTAL) BY MOUTH 2 (TWO) TIMES DAILY BEFORE A MEAL. 03/21/16   [name], MD  HYDROcodone-acetaminophen (NORCO) 5-325 MG tablet Take 1 tablet by mouth every 6 (six) hours as needed for moderate pain. 04/09/16   Robyn Haber, MD  JANUVIA 100 MG tablet TAKE 1 TABLET BY MOUTH EVERY DAY 04/19/16   Mancel Bale, PA-C  Lancets [facility] ULTRASOFT) lancets One touch ultra mini to check blood sugar daily dx code E11.9 10/27/14   [name], MD  metFORMIN (GLUCOPHAGE) 1000 MG tablet Take 1 tablet (1,000 mg total) by mouth 2 (two) times daily with a meal. 09/21/15   [name], MD  metoprolol (TOPROL-XL) 200 MG 24 hr tablet TAKE 1 TABLET (200 MG TOTAL) BY MOUTH DAILY. 12/28/15   [name], MD  valsartan (DIOVAN) 320 MG tablet Take 1 tablet (320 mg total) by mouth daily. 12/28/15   [name], MD    Patient Active Problem List   Diagnosis Date Noted  . Breast asymmetry 05/01/2015  . Cataract, nuclear 05/24/2012  . HTN (hypertension) 02/10/2012  . DM2 (diabetes mellitus, type 2) ([place]) 02/10/2012     Physical Exam  Constitutional: She is oriented  to person, place, and time. She appears well-developed and well-nourished. She is active and cooperative. No distress.  BP 126/80 mmHg  Pulse 75  Temp(Src) 97.5 F (36.4 C) (Oral)  Resp 18  Ht '5\' 4"'  (1.626 m)  Wt 203 lb (92.08 kg)  BMI 34.83 kg/m2  SpO2 99%   Eyes: Conjunctivae are normal.  Pulmonary/Chest: Effort normal.  Musculoskeletal:       Left elbow: Normal. She exhibits normal range of motion, no swelling, no effusion, no deformity and no laceration. No tenderness found.  No pain with grasping, strength testing or pronation/supination of the forearm.  Neurological: She is alert and oriented to person, place, and time.  Psychiatric: She has a normal mood  and affect. Her speech is normal and behavior is normal.    Dg Elbow 2 Views Left  04/15/2016  CLINICAL DATA:  Re-evaluate radial head fracture. Subsequent encounter. EXAM: LEFT ELBOW - 2 VIEW COMPARISON:  04/09/2016 FINDINGS: Previously described proximal radius fracture is not visible today. A joint effusion has resolved. No displaced fracture or malalignment. Medial epicondyle enthesophyte. IMPRESSION: Resolved joint effusion with no visualized fracture today. Electronically Signed   By: Monte Fantasia M.D.   On: 04/15/2016 15:47   Dg Elbow Complete Left  04/09/2016  CLINICAL DATA:  Hit elbow against wall EXAM: LEFT ELBOW - COMPLETE 3+ VIEW COMPARISON:  None. FINDINGS: Frontal, lateral, and bilateral oblique views were obtained. There is a nondisplaced fracture in the radial head region with the fracture parallel to the cortex of the proximal radial metaphysis. No other fracture is evident. No dislocation. There is a joint effusion. There is a spur along the medial distal humeral condyle. IMPRESSION: Nondisplaced radial head fracture. Joint effusion present consistent with hemarthrosis. Medial distal humeral condylar spur. No appreciable joint space narrowing. No dislocation. Electronically Signed   By: Lowella Grip III M.D.   On: 04/09/2016 13:00   Dg Elbow Complete Right  04/29/2016  CLINICAL DATA:  Left elbow injury.  Follow-up exam . EXAM: RIGHT ELBOW - COMPLETE 3+ VIEW COMPARISON:  04/15/2016 . FINDINGS: No acute bony or joint abnormality identified. No fracture identified. No evidence of effusion. IMPRESSION: No acute or focal abnormality. Electronically Signed   By: Marcello Moores  Register   On: 04/29/2016 11:27     ASSESSMENT & PLAN:  1. Radial head fracture, left, closed, with routine healing, subsequent encounter Appears healed. Splint no longer needed. Gradually advance activity and work to regain strength of the forearm. - DG Elbow Complete Right; Future    ADDENDUM:  After  patient left, I realized that the RIGHT elbow was x-rayed at this visit. Asked that our staff contact the patient to confirm which elbow was filmed during the visit. As she is not in any pain, has full ROM and is non-tender, and as the fracture was not seen on the films last week, she does not need to return for repeat films if in fact the RIGHT elbow was x-rayed at this visit.    Fara Chute, PA-C Physician Assistant-Certified [facility]

## 2016-04-29 NOTE — Patient Instructions (Addendum)
Gradually resume your previous activity. Your LEFT arm will be weaker that it was previously. You will need to exercise it to regain the strength, but do so slowly and build up.    IF you received an x-ray today, you will receive an invoice from [facility]. Please contact [facility] at [phone] with questions or concerns regarding your invoice.   IF you received labwork today, you will receive an invoice from [employer]. Please contact [facility] at [phone] with questions or concerns regarding your invoice.   Our billing staff will not be able to assist you with questions regarding bills from these companies.  You will be contacted with the lab results as soon as they are available. The fastest way to get your results is to activate your My Chart account. Instructions are located on the last page of this paperwork. If you have not heard from [place] regarding the results in 2 weeks, please contact this office.

## 2016-05-01 ENCOUNTER — Other Ambulatory Visit: Payer: Self-pay | Admitting: Family Medicine

## 2016-05-01 ENCOUNTER — Telehealth: Payer: Self-pay | Admitting: Physician Assistant

## 2016-05-01 NOTE — Telephone Encounter (Signed)
Please call this patient.  I recently saw her for follow-up of the LEFT elbow fracture. In reviewing the xrays, it appears that I ordered the most recent films of the RIGHT elbow (which were totally normal).  Please clarify WHICH elbow we ACTUALLY xrayed.  As she was pain free at the last visit, if she remains so, now out of the splint, she does not need to return for films of the LEFT if we actually did the right.

## 2016-05-02 NOTE — Progress Notes (Signed)
Be sure we check with radiology and find out where the mistake was made on labeling. Thanks. Richardson Landry

## 2016-05-03 ENCOUNTER — Ambulatory Visit: Payer: Commercial Managed Care - HMO

## 2016-05-03 DIAGNOSIS — S59902A Unspecified injury of left elbow, initial encounter: Secondary | ICD-10-CM | POA: Diagnosis not present

## 2016-05-03 DIAGNOSIS — S52122D Displaced fracture of head of left radius, subsequent encounter for closed fracture with routine healing: Secondary | ICD-10-CM

## 2016-05-03 NOTE — Addendum Note (Signed)
Addended by: Ebony Hail on: 05/03/2016 12:40 PM   Modules accepted: Orders

## 2016-05-03 NOTE — Progress Notes (Signed)
Clarified with patient that LEFT elbow was imaged. Spoke with radiologist, and result addended to reflect that the LEFT elbow was imaged.

## 2016-05-03 NOTE — Addendum Note (Signed)
Addended by: Ebony Hail on: 05/03/2016 01:43 PM   Modules accepted: Orders

## 2016-06-01 ENCOUNTER — Other Ambulatory Visit: Payer: Self-pay | Admitting: Family Medicine

## 2016-06-01 DIAGNOSIS — Z1231 Encounter for screening mammogram for malignant neoplasm of breast: Secondary | ICD-10-CM

## 2016-06-08 ENCOUNTER — Ambulatory Visit
Admission: RE | Admit: 2016-06-08 | Discharge: 2016-06-08 | Disposition: A | Discharge status: Home / Self Care | Payer: Commercial Managed Care - HMO | Source: Ambulatory Visit | Attending: Family Medicine | Admitting: Family Medicine

## 2016-06-08 DIAGNOSIS — Z1231 Encounter for screening mammogram for malignant neoplasm of breast: Secondary | ICD-10-CM

## 2016-06-22 ENCOUNTER — Ambulatory Visit: Payer: Commercial Managed Care - HMO | Admitting: Family Medicine

## 2016-06-23 ENCOUNTER — Encounter: Payer: Self-pay | Admitting: Family Medicine

## 2016-06-23 ENCOUNTER — Ambulatory Visit (INDEPENDENT_AMBULATORY_CARE_PROVIDER_SITE_OTHER): Payer: Commercial Managed Care - HMO | Admitting: Family Medicine

## 2016-06-23 VITALS — BP 122/80 | HR 60 | Temp 98.2°F | Resp 18 | Ht 64.0 in | Wt 204.0 lb

## 2016-06-23 DIAGNOSIS — IMO0001 Reserved for inherently not codable concepts without codable children: Secondary | ICD-10-CM

## 2016-06-23 DIAGNOSIS — M1009 Idiopathic gout, multiple sites: Secondary | ICD-10-CM

## 2016-06-23 DIAGNOSIS — M109 Gout, unspecified: Secondary | ICD-10-CM

## 2016-06-23 DIAGNOSIS — E1165 Type 2 diabetes mellitus with hyperglycemia: Secondary | ICD-10-CM

## 2016-06-23 LAB — URIC ACID: URIC ACID, SERUM: 3.5 mg/dL (ref 2.5–7.0)

## 2016-06-23 NOTE — Patient Instructions (Addendum)
I will check your A1c, then we can discuss options on treatment. Increasing exercise to at least 4 days a week may get you to where you need to be. I will also check a gout test to see if any changes needed with allopurinol. Follow-up with me in 3 months.    IF you received an x-ray today, you will receive an invoice from [facility]. Please contact [facility] at [phone] with questions or concerns regarding your invoice.   IF you received labwork today, you will receive an invoice from [employer]. Please contact [facility] at [phone] with questions or concerns regarding your invoice.   Our billing staff will not be able to assist you with questions regarding bills from these companies.  You will be contacted with the lab results as soon as they are available. The fastest way to get your results is to activate your My Chart account. Instructions are located on the last page of this paperwork. If you have not heard from [place] regarding the results in 2 weeks, please contact this office.

## 2016-06-23 NOTE — Progress Notes (Signed)
By signing my name below, I, [name], attest that this documentation has been prepared under the direction and in the presence of [name], MD.  Electronically Signed: [name], Medical Scribe. 06/23/2016. 9:46 AM.  Subjective:    Patient ID: Susan Miles, female    DOB: Apr 19, 1946, 70 y.o.   MRN: [id number]  HPI Chief Complaint  Patient presents with  . Follow-up    3 MONTH/BLOODWORK  . Medication Refill    ALL MEDS    HPI Comments: Susan Miles is a 70 y.o. female who presents to the [facility] for DM follow-up. Pt mentions she broke her arm a while ago, but this has improved since then.  DM: She was last seen in April, and she was improving last visit. We continued her in the same regimen, as expected improvements were seen with exercise and diet change. Her blood sugar at home has been ranging from 120- 169. Pt exercising about 2 days a week. Pt takes Januvia, Glipizide BID, and Metformin 1 tablet QD. Lab Results  Component Value Date   HGBA1C 7.4 03/23/2016   Lab Results  Component Value Date   MICROALBUR 7.5 03/23/2016   Wt Readings from Last 3 Encounters:  06/23/16 204 lb (92.534 kg)  04/29/16 203 lb (92.08 kg)  04/15/16 204 lb 3.2 oz (92.625 kg)   Gout: Pt had a flare up last week and has had 2 flare up since her last office visit. Pt had pain in her small toe on her left foot. Pt states the pain in her foot has improved since then. Pt still takes Allopurinol daily. Lab Results  Component Value Date   LABURIC 5.2 12/28/2015   Patient Active Problem List   Diagnosis Date Noted  . Breast asymmetry 05/01/2015  . Cataract, nuclear 05/24/2012  . HTN (hypertension) 02/10/2012  . DM2 (diabetes mellitus, type 2) ([place]) 02/10/2012   History reviewed. No pertinent past medical history. History reviewed. No pertinent past surgical history. Allergies  Allergen Reactions  . Ace Inhibitors Cough   Prior to Admission medications     Medication Sig Start Date End Date Taking? Authorizing Provider  ACCU-CHEK AVIVA PLUS test strip CHECK BLOOD GLUCOSE ONCE EVERY DAY 02/04/16   [name], MD  allopurinol (ZYLOPRIM) 100 MG tablet Take 2 tablets (200 mg total) by mouth daily. 12/28/15   [name], MD  amLODipine (NORVASC) 5 MG tablet Take 1 tablet (5 mg total) by mouth daily. 03/23/16   [name], MD  aspirin 81 MG tablet Take 81 mg by mouth daily.    Historical Provider, MD  atorvastatin (LIPITOR) 20 MG tablet Take 1 tablet (20 mg total) by mouth daily at 6 PM. 12/28/15   [name], MD  Blood Glucose Monitoring Suppl (BLOOD GLUCOSE METER KIT AND SUPPLIES) KIT One touch ultra mini monitor to check blood sugars once daily 10/27/14   [name], MD  cetirizine (ZYRTEC) 10 MG tablet Take 1 tablet (10 mg total) by mouth daily. 09/21/15   [name], MD  cloNIDine (CATAPRES) 0.2 MG tablet TAKE 1 TABLET (0.2 MG TOTAL) BY MOUTH 2 TIMES DAILY 12/28/15   [name], MD  colchicine 0.6 MG tablet 2 tablets once, then 1 tablet in 1 hour if needed for pain. No further doses per attack. 12/28/15   [name], MD  glipiZIDE (GLUCOTROL) 10 MG tablet TAKE 2 TABLETS (20 MG TOTAL) BY MOUTH 2 (TWO) TIMES  DAILY BEFORE A MEAL. 05/03/16   [name], MD  HYDROcodone-acetaminophen (NORCO) 5-325 MG tablet Take 1 tablet by mouth every 6 (six) hours as needed for moderate pain. 04/09/16   Robyn Haber, MD  JANUVIA 100 MG tablet TAKE 1 TABLET BY MOUTH EVERY DAY 04/19/16   Mancel Bale, PA-C  Lancets [facility] ULTRASOFT) lancets One touch ultra mini to check blood sugar daily dx code E11.9 10/27/14   [name], MD  metFORMIN (GLUCOPHAGE) 1000 MG tablet Take 1 tablet (1,000 mg total) by mouth 2 (two) times daily with a meal. 09/21/15   [name], MD  metoprolol (TOPROL-XL) 200 MG 24 hr tablet TAKE 1 TABLET (200 MG TOTAL) BY MOUTH DAILY. 12/28/15   [name], MD  valsartan (DIOVAN) 320 MG  tablet Take 1 tablet (320 mg total) by mouth daily. 12/28/15   [name], MD   Social History   Social History  . Marital Status: Married    Spouse Name: N/A  . Number of Children: N/A  . Years of Education: N/A   Occupational History  . Not on file.   Social History Main Topics  . Smoking status: Never Smoker   . Smokeless tobacco: Never Used  . Alcohol Use: Not on file  . Drug Use: Not on file  . Sexual Activity: Not on file   Other Topics Concern  . Not on file   Social History Narrative   Review of Systems  Musculoskeletal: Positive for arthralgias.     Objective:  BP 122/80 mmHg  Pulse 60  Temp(Src) 98.2 F (36.8 C) (Oral)  Resp 18  Ht '5\' 4"'  (1.626 m)  Wt 204 lb (92.534 kg)  BMI 35.00 kg/m2  SpO2 97%  Physical Exam  Constitutional: She is oriented to person, place, and time. She appears well-developed and well-nourished.  HENT:  Head: Normocephalic and atraumatic.  Eyes: Conjunctivae and EOM are normal. Pupils are equal, round, and reactive to light.  Neck: Carotid bruit is not present.  Cardiovascular: Normal rate, regular rhythm, normal heart sounds and intact distal pulses.   Pulmonary/Chest: Effort normal and breath sounds normal.  Abdominal: Soft. She exhibits no pulsatile midline mass. There is no tenderness.  Neurological: She is alert and oriented to person, place, and time.  Skin: Skin is warm and dry.  Psychiatric: She has a normal mood and affect. Her behavior is normal.  Vitals reviewed.   Assessment & Plan:   Susan Miles is a 70 y.o. female Uncontrolled type 2 diabetes mellitus without complication, without long-term current use of insulin ([place]) - Plan: Hemoglobin A1c  -Tolerating medications currently. Discussed increasing activity/exercise to work on weight loss to improve her diabetes control. We'll continue same medication for now, A1c pending.  Gouty arthritis - Plan: Uric Acid  -We'll repeat uric acid, but previous level  looked okay. Continue allopurinol at same dose for now. It is possible that she had other causes of pain in her feet, but similar symptoms to gout in the past. RTC precautions if persistent or recurrence.  No orders of the defined types were placed in this encounter.   Patient Instructions   I will check your A1c, then we can discuss options on treatment. Increasing exercise to at least 4 days a week may get you to where you need to be. I will also check a gout test to see if any changes needed with allopurinol. Follow-up with me in 3 months.  IF you received an x-ray today, you will receive an invoice from [facility]. Please contact [facility] at [phone] with questions or concerns regarding your invoice.   IF you received labwork today, you will receive an invoice from [employer]. Please contact [facility] at [phone] with questions or concerns regarding your invoice.   Our billing staff will not be able to assist you with questions regarding bills from these companies.  You will be contacted with the lab results as soon as they are available. The fastest way to get your results is to activate your My Chart account. Instructions are located on the last page of this paperwork. If you have not heard from [place] regarding the results in 2 weeks, please contact this office.        I personally performed the services described in this documentation, which was scribed in my presence. The recorded information has been reviewed and considered, and addended by me as needed.   Signed,   [name], MD [facility].  06/24/2016 12:59 PM

## 2016-06-24 LAB — HEMOGLOBIN A1C
HEMOGLOBIN A1C: 7.5 % — AB (ref <5.7)
MEAN PLASMA GLUCOSE: 169 mg/dL

## 2016-06-26 ENCOUNTER — Other Ambulatory Visit: Payer: Self-pay | Admitting: Family Medicine

## 2016-06-28 ENCOUNTER — Other Ambulatory Visit: Payer: Self-pay | Admitting: Family Medicine

## 2016-06-28 DIAGNOSIS — I10 Essential (primary) hypertension: Secondary | ICD-10-CM

## 2016-07-06 ENCOUNTER — Other Ambulatory Visit: Payer: Self-pay | Admitting: Family Medicine

## 2016-07-06 DIAGNOSIS — E119 Type 2 diabetes mellitus without complications: Secondary | ICD-10-CM

## 2016-07-06 DIAGNOSIS — E785 Hyperlipidemia, unspecified: Secondary | ICD-10-CM

## 2016-07-09 ENCOUNTER — Other Ambulatory Visit: Payer: Self-pay | Admitting: Physician Assistant

## 2016-09-22 ENCOUNTER — Encounter: Payer: Self-pay | Admitting: Family Medicine

## 2016-09-22 ENCOUNTER — Ambulatory Visit (INDEPENDENT_AMBULATORY_CARE_PROVIDER_SITE_OTHER): Payer: Commercial Managed Care - HMO | Admitting: Family Medicine

## 2016-09-22 VITALS — BP 130/88 | HR 80 | Temp 98.3°F | Resp 16 | Ht 63.5 in | Wt 206.4 lb

## 2016-09-22 DIAGNOSIS — Z23 Encounter for immunization: Secondary | ICD-10-CM

## 2016-09-22 DIAGNOSIS — M109 Gout, unspecified: Secondary | ICD-10-CM

## 2016-09-22 DIAGNOSIS — E119 Type 2 diabetes mellitus without complications: Secondary | ICD-10-CM | POA: Diagnosis not present

## 2016-09-22 DIAGNOSIS — E782 Mixed hyperlipidemia: Secondary | ICD-10-CM | POA: Diagnosis not present

## 2016-09-22 DIAGNOSIS — I10 Essential (primary) hypertension: Secondary | ICD-10-CM | POA: Diagnosis not present

## 2016-09-22 LAB — LIPID PANEL
CHOLESTEROL: 148 mg/dL (ref 125–200)
HDL: 45 mg/dL — ABNORMAL LOW (ref >=46)
LDL Cholesterol: 87 mg/dL (ref <130)
Total CHOL/HDL Ratio: 3.3 Ratio (ref <=5.0)
Triglycerides: 82 mg/dL (ref <150)
VLDL: 16 mg/dL (ref <30)

## 2016-09-22 LAB — COMPLETE METABOLIC PANEL WITH GFR
ALBUMIN: 3.9 g/dL (ref 3.6–5.1)
ALK PHOS: 71 U/L (ref 33–130)
ALT: 16 U/L (ref 6–29)
AST: 17 U/L (ref 10–35)
BILIRUBIN TOTAL: 0.6 mg/dL (ref 0.2–1.2)
BUN: 15 mg/dL (ref 7–25)
CALCIUM: 10.2 mg/dL (ref 8.6–10.4)
CO2: 25 mmol/L (ref 20–31)
CREATININE: 0.91 mg/dL (ref 0.50–0.99)
Chloride: 104 mmol/L (ref 98–110)
GFR, Est African American: 74 mL/min (ref >=60)
GFR, Est Non African American: 65 mL/min (ref >=60)
GLUCOSE: 180 mg/dL — AB (ref 65–99)
Potassium: 4 mmol/L (ref 3.5–5.3)
Sodium: 137 mmol/L (ref 135–146)
TOTAL PROTEIN: 7 g/dL (ref 6.1–8.1)

## 2016-09-22 MED ORDER — SITAGLIPTIN PHOSPHATE 100 MG PO TABS: 100.0000 mg | ORAL_TABLET | Freq: Every day | ORAL | 1 refills | Status: DC

## 2016-09-22 MED ORDER — ONETOUCH ULTRA SYSTEM W/DEVICE KIT: 1.0000 | PACK | Freq: Once | 0 refills | Status: AC

## 2016-09-22 MED ORDER — ALLOPURINOL 100 MG PO TABS
200.0000 mg | ORAL_TABLET | Freq: Every day | ORAL | 1 refills | Status: DC
Start: 2016-09-22 — End: 2017-01-12

## 2016-09-22 MED ORDER — METFORMIN HCL 1000 MG PO TABS: 1000.0000 mg | ORAL_TABLET | Freq: Two times a day (BID) | ORAL | 1 refills | Status: DC

## 2016-09-22 MED ORDER — CLONIDINE HCL 0.2 MG PO TABS: ORAL_TABLET | ORAL | 1 refills | Status: DC

## 2016-09-22 MED ORDER — GLIPIZIDE 10 MG PO TABS: ORAL_TABLET | ORAL | 1 refills | Status: DC

## 2016-09-22 MED ORDER — METOPROLOL SUCCINATE ER 200 MG PO TB24: ORAL_TABLET | ORAL | 1 refills | Status: DC

## 2016-09-22 MED ORDER — AMLODIPINE BESYLATE 5 MG PO TABS: 5.0000 mg | ORAL_TABLET | Freq: Every day | ORAL | 1 refills | Status: DC

## 2016-09-22 MED ORDER — VALSARTAN 320 MG PO TABS: 320.0000 mg | ORAL_TABLET | Freq: Every day | ORAL | 1 refills | Status: DC

## 2016-09-22 MED ORDER — ATORVASTATIN CALCIUM 20 MG PO TABS: 20.0000 mg | ORAL_TABLET | Freq: Every day | ORAL | 1 refills | Status: DC

## 2016-09-22 NOTE — Progress Notes (Signed)
By signing my name below, I, Susan Miles, attest that this documentation has been prepared under the direction and in the presence of Susan Ray, MD.  Electronically Signed: Verlee Miles, Medical Scribe. 09/22/16. 10:55 AM.  Subjective:    Patient ID: Susan Miles, female    DOB: 1946/11/29, 70 y.o.   MRN: [id number]  HPI Chief Complaint  Patient presents with  . Diabetes    flu shot  . Medication Refill    all meds need refills, pt would like new blood sugar monitor    HPI Comments: Susan Miles is a 70 y.o. female who presents to the [facility] for follow-up. Pt has only had a small glass of apple juice to take with her medications.  T2DM: Recommedned inc exercise and weight loss. No changes in medications as she plans to continue exercise and weight loss. Pt is compliant with Januvia, Glucotrol, and Metformin. Pt uses her elliptical machine for 20 mins 3-4x a week. Pt's last ophthalmologist visit was in May and there were no diabetic changes found. Pt's next dentist appt is next week. Pt denies experiencing any negative side effects while on her medications. Lab Results  Component Value Date   HGBA1C 7.5 (H) 06/23/2016   Lab Results  Component Value Date   MICROALBUR 7.5 03/23/2016   Wt Readings from Last 3 Encounters:  09/22/16 206 lb 6.4 oz (93.6 kg)  06/23/16 204 lb (92.5 kg)  04/29/16 203 lb (92.1 kg)   HLD: On lipitor 20 mg QD. Pt has not been eating fast food an she's ben eating more home cooked meals with the vegetables in her garden. Pt denies myalgias, arthralgias, or any other negative side effects from this medications. Lab Results  Component Value Date   CHOL 130 03/23/2016   HDL 41 (L) 03/23/2016   LDLCALC 77 03/23/2016   TRIG 59 03/23/2016   CHOLHDL 3.2 03/23/2016   Lab Results  Component Value Date   ALT 14 03/23/2016   AST 14 03/23/2016   ALKPHOS 54 03/23/2016   BILITOT 0.4 03/23/2016   HTN: Has improved from  readings over the past year. Pt is compliant with losartan, metoprolol, catapres and norvasc. Pt's systolic bp has been ranging [phone]. Pt denies light-headedness, chest pain, SOB and any other negative side effects.  Lab Results  Component Value Date   CREATININE 1.05 (H) 03/23/2016   BP Readings from Last 3 Encounters:  09/22/16 130/88  06/23/16 122/80  04/29/16 126/80   Gout: Takes allopurinol 200 mg QD. Pt is compliant with her medication and her last flare was in her knee in Aug. Pt denies new rashes or any negative side effects while taking her medication. Pt is not taking colchicine.  Immunizations: Pt would like to get her flu shot today. Immunization History  Administered Date(s) Administered  . DTaP 02/03/2007  . Influenza Split 09/17/2012  . Influenza,inj,Quad PF,36+ Mos 09/30/2013, 07/28/2014, 09/21/2015, 09/22/2016  . Pneumococcal Conjugate-13 12/05/2010, 10/27/2014  . Pneumococcal Polysaccharide-23 09/21/2015  . Zoster 03/19/2014    Patient Active Problem List   Diagnosis Date Noted  . Breast asymmetry 05/01/2015  . Cataract, nuclear 05/24/2012  . HTN (hypertension) 02/10/2012  . DM2 (diabetes mellitus, type 2) ([place]) 02/10/2012   No past medical history on file. No past surgical history on file. Allergies  Allergen Reactions  . Ace Inhibitors Cough   Prior to Admission medications   Medication Sig Start Date End Date Taking? Authorizing Provider  ACCU-CHEK AVIVA  PLUS test strip CHECK BLOOD GLUCOSE ONCE EVERY DAY 02/04/16  Yes [name], MD  allopurinol (ZYLOPRIM) 100 MG tablet Take 2 tablets (200 mg total) by mouth daily. 12/28/15  Yes [name], MD  amLODipine (NORVASC) 5 MG tablet TAKE 1 TABLET (5 MG TOTAL) BY MOUTH DAILY. 06/30/16  Yes [name], MD  aspirin 81 MG tablet Take 81 mg by mouth daily.   Yes Historical Provider, MD  atorvastatin (LIPITOR) 20 MG tablet TAKE 1 TABLET (20 MG TOTAL) BY MOUTH DAILY AT 6 PM. 07/08/16  Yes [name], MD  Blood Glucose Monitoring Suppl (BLOOD GLUCOSE METER KIT AND SUPPLIES) KIT One touch ultra mini monitor to check blood sugars once daily 10/27/14  Yes [name], MD  cetirizine (ZYRTEC) 10 MG tablet Take 1 tablet (10 mg total) by mouth daily. 09/21/15  Yes [name], MD  cloNIDine (CATAPRES) 0.2 MG tablet TAKE 1 TABLET (0.2 MG TOTAL) BY MOUTH 2 TIMES DAILY 12/28/15  Yes [name], MD  colchicine 0.6 MG tablet 2 tablets once, then 1 tablet in 1 hour if needed for pain. No further doses per attack. 12/28/15  Yes [name], MD  glipiZIDE (GLUCOTROL) 10 MG tablet TAKE 2 TABLETS (20 MG TOTAL) BY MOUTH 2 (TWO) TIMES DAILY BEFORE A MEAL. 06/27/16  Yes [name], MD  HYDROcodone-acetaminophen (NORCO) 5-325 MG tablet Take 1 tablet by mouth every 6 (six) hours as needed for moderate pain. 04/09/16  Yes Robyn Haber, MD  JANUVIA 100 MG tablet TAKE 1 TABLET BY MOUTH EVERY DAY 07/10/16  Yes [name], MD  Lancets [facility] ULTRASOFT) lancets One touch ultra mini to check blood sugar daily dx code E11.9 10/27/14  Yes [name], MD  metFORMIN (GLUCOPHAGE) 1000 MG tablet TAKE 1 TABLET (1,000 MG TOTAL) BY MOUTH 2 (TWO) TIMES DAILY WITH A MEAL. 07/08/16   [name], MD  metoprolol (TOPROL-XL) 200 MG 24 hr tablet TAKE 1 TABLET (200 MG TOTAL) BY MOUTH DAILY. 12/28/15   [name], MD  valsartan (DIOVAN) 320 MG tablet Take 1 tablet (320 mg total) by mouth daily. 12/28/15   [name], MD   Social History   Social History  . Marital status: Married    Spouse name: N/A  . Number of children: N/A  . Years of education: N/A   Occupational History  . Not on file.   Social History Main Topics  . Smoking status: Never Smoker  . Smokeless tobacco: Never Used  . Alcohol use Not on file  . Drug use: Unknown  . Sexual activity: Not on file   Other Topics Concern  . Not on file   Social History Narrative  . No narrative on file   Review of  Systems  Constitutional: Negative for fatigue and unexpected weight change.  Respiratory: Negative for chest tightness and shortness of breath.   Cardiovascular: Negative for chest pain, palpitations and leg swelling.  Gastrointestinal: Negative for abdominal pain and blood in stool.  Musculoskeletal: Negative for arthralgias and myalgias.  Skin: Negative for rash.  Neurological: Negative for dizziness, syncope, light-headedness and headaches.   Objective:  Physical Exam  Constitutional: She is oriented to person, place, and time. She appears well-developed and well-nourished.  HENT:  Head: Normocephalic and atraumatic.  Eyes: Conjunctivae and EOM are normal. Pupils are equal, round, and reactive to light.  Neck: Carotid bruit is not present.  Cardiovascular: Normal rate, regular  rhythm, normal heart sounds and intact distal pulses.  Exam reveals no friction rub.   No murmur heard. No carotid bruits  Pulmonary/Chest: Effort normal and breath sounds normal. No respiratory distress. She has no wheezes. She has no rales.  Abdominal: Soft. She exhibits no pulsatile midline mass. There is no tenderness.  Musculoskeletal: She exhibits no edema (lower extremity).  Neurological: She is alert and oriented to person, place, and time.  Skin: Skin is warm and dry.  Psychiatric: She has a normal mood and affect. Her behavior is normal.  Vitals reviewed.  BP 130/88   Pulse 80   Temp 98.3 F (36.8 C) (Oral)   Resp 16   Ht 5' 3.5" (1.613 m)   Wt 206 lb 6.4 oz (93.6 kg)   SpO2 98%   BMI 35.99 kg/m  Assessment & Plan:   TWILIA YAKLIN is a 70 y.o. female Type 2 diabetes mellitus without complication, without long-term current use of insulin ([place]) - Plan: metFORMIN (GLUCOPHAGE) 1000 MG tablet, sitaGLIPtin (JANUVIA) 100 MG tablet, glipiZIDE (GLUCOTROL) 10 MG tablet, Hemoglobin A1C, Blood Glucose Monitoring Suppl (ONE TOUCH ULTRA SYSTEM KIT) w/Device KIT  - Still uncontrolled last visit, A1c  pending from today. Minimal weight change, and actually slightly increased, but has been exercising as well as keeping her own foods with fresh vegetables.  - No change in meds for now, we'll determine if needed once A1c returned  Needs flu shot - Plan: Flu Vaccine QUAD 36+ mos IM  Essential hypertension - Plan: valsartan (DIOVAN) 320 MG tablet, metoprolol (TOPROL-XL) 200 MG 24 hr tablet, cloNIDine (CATAPRES) 0.2 MG tablet, amLODipine (NORVASC) 5 MG tablet  - Stable, no med changes. Refill medications for 6 months. Labs pending.  Mixed hyperlipidemia - Plan: atorvastatin (LIPITOR) 20 MG tablet, COMPLETE METABOLIC PANEL WITH GFR, Lipid panel  - Tolerating Lipitor, refilled, labs pending.  Gout of left foot, unspecified cause, unspecified chronicity - Plan: allopurinol (ZYLOPRIM) 100 MG tablet  - Tolerating 200 mg allopurinol dosing. Last uric acid appeared to be okay. No changes for now. Advised of risk of myopathy with colchicine and statin if she does need to take colchicine.  Meds ordered this encounter  Medications  . valsartan (DIOVAN) 320 MG tablet    Sig: Take 1 tablet (320 mg total) by mouth daily.    Dispense:  90 tablet    Refill:  1  . metoprolol (TOPROL-XL) 200 MG 24 hr tablet    Sig: TAKE 1 TABLET (200 MG TOTAL) BY MOUTH DAILY.    Dispense:  90 tablet    Refill:  1  . metFORMIN (GLUCOPHAGE) 1000 MG tablet    Sig: Take 1 tablet (1,000 mg total) by mouth 2 (two) times daily with a meal.    Dispense:  180 tablet    Refill:  1  . sitaGLIPtin (JANUVIA) 100 MG tablet    Sig: Take 1 tablet (100 mg total) by mouth daily.    Dispense:  90 tablet    Refill:  1    PT REQUESTING 90 DAY SUPPLY  . glipiZIDE (GLUCOTROL) 10 MG tablet    Sig: TAKE 2 TABLETS (20 MG TOTAL) BY MOUTH 2 (TWO) TIMES DAILY BEFORE A MEAL.    Dispense:  360 tablet    Refill:  1  . cloNIDine (CATAPRES) 0.2 MG tablet    Sig: TAKE 1 TABLET (0.2 MG TOTAL) BY MOUTH 2 TIMES DAILY    Dispense:  180 tablet     Refill:  1  . amLODipine (NORVASC) 5 MG tablet    Sig: Take 1 tablet (5 mg total) by mouth daily.    Dispense:  90 tablet    Refill:  1  . atorvastatin (LIPITOR) 20 MG tablet    Sig: Take 1 tablet (20 mg total) by mouth daily at 6 PM.    Dispense:  90 tablet    Refill:  1  . allopurinol (ZYLOPRIM) 100 MG tablet    Sig: Take 2 tablets (200 mg total) by mouth daily.    Dispense:  180 tablet    Refill:  1  . Blood Glucose Monitoring Suppl (ONE TOUCH ULTRA SYSTEM KIT) w/Device KIT    Sig: 1 kit by Does not apply route once. Glucometer of choice, #30 testing strips, 6 refills. Call if new rx or changes needed.    Dispense:  1 each    Refill:  0   Patient Instructions       IF you received an x-Miles today, you will receive an invoice from [facility]. Please contact [facility] at [phone] with questions or concerns regarding your invoice.   IF you received labwork today, you will receive an invoice from [employer]. Please contact [facility] at [phone] with questions or concerns regarding your invoice.   Our billing staff will not be able to assist you with questions regarding bills from these companies.  You will be contacted with the lab results as soon as they are available. The fastest way to get your results is to activate your My Chart account. Instructions are located on the last page of this paperwork. If you have not heard from [place] regarding the results in 2 weeks, please contact this office.    Influenza (Flu) Vaccine (Inactivated or Recombinant):  1. Why get vaccinated? Influenza ("flu") is a contagious disease that spreads around the [place] every year, usually between October and May. Flu is caused by influenza viruses, and is spread mainly by coughing, sneezing, and close contact. Anyone can get flu. Flu strikes suddenly and can last several days. Symptoms vary by age, but can include:  fever/chills  sore  throat  muscle aches  fatigue  cough  headache  runny or stuffy nose Flu can also lead to pneumonia and blood infections, and cause diarrhea and seizures in children. If you have a medical condition, such as heart or lung disease, flu can make it worse. Flu is more dangerous for some people. Infants and young children, people 49 years of age and older, pregnant women, and people with certain health conditions or a weakened immune system are at greatest risk. Each year thousands of people in the [place] die from flu, and many more are hospitalized. Flu vaccine can:  keep you from getting flu,  make flu less severe if you do get it, and  keep you from spreading flu to your family and other people. 2. Inactivated and recombinant flu vaccines A dose of flu vaccine is recommended every flu season. Children 6 months through 27 years of age may need two doses during the same flu season. Everyone else needs only one dose each flu season. Some inactivated flu vaccines contain a very small amount of a mercury-based preservative called thimerosal. Studies have not shown thimerosal in vaccines to be harmful, but flu vaccines that do not contain thimerosal are available. There is no live flu virus in flu shots. They cannot cause the flu. There are many flu viruses, and they  are always changing. Each year a new flu vaccine is made to protect against three or four viruses that are likely to cause disease in the upcoming flu season. But even when the vaccine doesn't exactly match these viruses, it may still provide some protection. Flu vaccine cannot prevent:  flu that is caused by a virus not covered by the vaccine, or  illnesses that look like flu but are not. It takes about 2 weeks for protection to develop after vaccination, and protection lasts through the flu season. 3. Some people should not get this vaccine Tell the person who is giving you the vaccine:  If you have any severe,  life-threatening allergies. If you ever had a life-threatening allergic reaction after a dose of flu vaccine, or have a severe allergy to any part of this vaccine, you may be advised not to get vaccinated. Most, but not all, types of flu vaccine contain a small amount of egg protein.  If you ever had Guillain-Barre Syndrome (also called GBS). Some people with a history of GBS should not get this vaccine. This should be discussed with your doctor.  If you are not feeling well. It is usually okay to get flu vaccine when you have a mild illness, but you might be asked to come back when you feel better. 4. Risks of a vaccine reaction With any medicine, including vaccines, there is a chance of reactions. These are usually mild and go away on their own, but serious reactions are also possible. Most people who get a flu shot do not have any problems with it. Minor problems following a flu shot include:  soreness, redness, or swelling where the shot was given  hoarseness  sore, red or itchy eyes  cough  fever  aches  headache  itching  fatigue If these problems occur, they usually begin soon after the shot and last 1 or 2 days. More serious problems following a flu shot can include the following:  There may be a small increased risk of Guillain-Barre Syndrome (GBS) after inactivated flu vaccine. This risk has been estimated at 1 or 2 additional cases per million people vaccinated. This is much lower than the risk of severe complications from flu, which can be prevented by flu vaccine.  Young children who get the flu shot along with pneumococcal vaccine (PCV13) and/or DTaP vaccine at the same time might be slightly more likely to have a seizure caused by fever. Ask your doctor for more information. Tell your doctor if a child who is getting flu vaccine has ever had a seizure. Problems that could happen after any injected vaccine:  People sometimes faint after a medical procedure, including  vaccination. Sitting or lying down for about 15 minutes can help prevent fainting, and injuries caused by a fall. Tell your doctor if you feel dizzy, or have vision changes or ringing in the ears.  Some people get severe pain in the shoulder and have difficulty moving the arm where a shot was given. This happens very rarely.  Any medication can cause a severe allergic reaction. Such reactions from a vaccine are very rare, estimated at about 1 in a million doses, and would happen within a few minutes to a few hours after the vaccination. As with any medicine, there is a very remote chance of a vaccine causing a serious injury or death. The safety of vaccines is always being monitored. For more information, visit: http://www.aguilar.org/ 5. What if there is a serious reaction? What should  I look for?  Look for anything that concerns you, such as signs of a severe allergic reaction, very high fever, or unusual behavior. Signs of a severe allergic reaction can include hives, swelling of the face and throat, difficulty breathing, a fast heartbeat, dizziness, and weakness. These would start a few minutes to a few hours after the vaccination. What should I do?  If you think it is a severe allergic reaction or other emergency that can't wait, call 9-1-1 and get the person to the nearest hospital. Otherwise, call your doctor.  Reactions should be reported to the Vaccine Adverse Event Reporting System (VAERS). Your doctor should file this report, or you can do it yourself through the VAERS web site at www.vaers.SamedayNews.es, or by calling (605)159-1830. VAERS does not give medical advice. 6. The National Vaccine Injury Compensation Program The [employer] Vaccine Injury Compensation Program (VICP) is a federal program that was created to compensate people who may have been injured by certain vaccines. Persons who believe they may have been injured by a vaccine can learn about the program and about filing a  claim by calling (512) 687-5597 or visiting the [place] website at GoldCloset.com.ee. There is a time limit to file a claim for compensation. 7. How can I learn more?  Ask your healthcare provider. He or she can give you the vaccine package insert or suggest other sources of information.  Call your local or state health department.  Contact the Centers for Disease Control and Prevention (CDC):  Call 4232508765 (1-800-CDC-INFO) or  Visit CDC's website at https://gibson.com/ Vaccine Information Statement Inactivated Influenza Vaccine (07/11/2014)   This information is not intended to replace advice given to you by your health care provider. Make sure you discuss any questions you have with your health care provider.   Document Released: 09/15/2006 Document Revised: 12/12/2014 Document Reviewed: 07/14/2014 Elsevier Interactive Patient Education [employer].    I personally performed the services described in this documentation, which was scribed in my presence. The recorded information has been reviewed and considered, and addended by me as needed.   Signed,   Susan Ray, MD [facility].  09/22/16 1:13 PM

## 2016-09-22 NOTE — Patient Instructions (Signed)
   IF you received an x-ray today, you will receive an invoice from [facility]. Please contact [facility] at [phone] with questions or concerns regarding your invoice.   IF you received labwork today, you will receive an invoice from [facility]. Please contact [facility] at [phone] with questions or concerns regarding your invoice.   Our billing staff will not be able to assist you with questions regarding bills from these companies.  You will be contacted with the lab results as soon as they are available. The fastest way to get your results is to activate your My Chart account. Instructions are located on the last page of this paperwork. If you have not heard from us regarding the results in 2 weeks, please contact this office.    Influenza (Flu) Vaccine (Inactivated or Recombinant):  1. Why get vaccinated? Influenza ("flu") is a contagious disease that spreads around the [place] every year, usually between October and May. Flu is caused by influenza viruses, and is spread mainly by coughing, sneezing, and close contact. Anyone can get flu. Flu strikes suddenly and can last several days. Symptoms vary by age, but can include:  fever/chills  sore throat  muscle aches  fatigue  cough  headache  runny or stuffy nose Flu can also lead to pneumonia and blood infections, and cause diarrhea and seizures in children. If you have a medical condition, such as heart or lung disease, flu can make it worse. Flu is more dangerous for some people. Infants and young children, people 65 years of age and older, pregnant women, and people with certain health conditions or a weakened immune system are at greatest risk. Each year thousands of people in the [place] die from flu, and many more are hospitalized. Flu vaccine can:  keep you from getting flu,  make flu less severe if you do get it, and  keep you from spreading flu to  your family and other people. 2. Inactivated and recombinant flu vaccines A dose of flu vaccine is recommended every flu season. Children 6 months through 8 years of age may need two doses during the same flu season. Everyone else needs only one dose each flu season. Some inactivated flu vaccines contain a very small amount of a mercury-based preservative called thimerosal. Studies have not shown thimerosal in vaccines to be harmful, but flu vaccines that do not contain thimerosal are available. There is no live flu virus in flu shots. They cannot cause the flu. There are many flu viruses, and they are always changing. Each year a new flu vaccine is made to protect against three or four viruses that are likely to cause disease in the upcoming flu season. But even when the vaccine doesn't exactly match these viruses, it may still provide some protection. Flu vaccine cannot prevent:  flu that is caused by a virus not covered by the vaccine, or  illnesses that look like flu but are not. It takes about 2 weeks for protection to develop after vaccination, and protection lasts through the flu season. 3. Some people should not get this vaccine Tell the person who is giving you the vaccine:  If you have any severe, life-threatening allergies. If you ever had a life-threatening allergic reaction after a dose of flu vaccine, or have a severe allergy to any part of this vaccine, you may be advised not to get vaccinated. Most, but not all, types of flu vaccine contain a small amount of egg protein.    If you ever had Guillain-Barre Syndrome (also called GBS). Some people with a history of GBS should not get this vaccine. This should be discussed with your doctor.  If you are not feeling well. It is usually okay to get flu vaccine when you have a mild illness, but you might be asked to come back when you feel better. 4. Risks of a vaccine reaction With any medicine, including vaccines, there is a chance of  reactions. These are usually mild and go away on their own, but serious reactions are also possible. Most people who get a flu shot do not have any problems with it. Minor problems following a flu shot include:  soreness, redness, or swelling where the shot was given  hoarseness  sore, red or itchy eyes  cough  fever  aches  headache  itching  fatigue If these problems occur, they usually begin soon after the shot and last 1 or 2 days. More serious problems following a flu shot can include the following:  There may be a small increased risk of Guillain-Barre Syndrome (GBS) after inactivated flu vaccine. This risk has been estimated at 1 or 2 additional cases per million people vaccinated. This is much lower than the risk of severe complications from flu, which can be prevented by flu vaccine.  Young children who get the flu shot along with pneumococcal vaccine (PCV13) and/or DTaP vaccine at the same time might be slightly more likely to have a seizure caused by fever. Ask your doctor for more information. Tell your doctor if a child who is getting flu vaccine has ever had a seizure. Problems that could happen after any injected vaccine:  People sometimes faint after a medical procedure, including vaccination. Sitting or lying down for about 15 minutes can help prevent fainting, and injuries caused by a fall. Tell your doctor if you feel dizzy, or have vision changes or ringing in the ears.  Some people get severe pain in the shoulder and have difficulty moving the arm where a shot was given. This happens very rarely.  Any medication can cause a severe allergic reaction. Such reactions from a vaccine are very rare, estimated at about 1 in a million doses, and would happen within a few minutes to a few hours after the vaccination. As with any medicine, there is a very remote chance of a vaccine causing a serious injury or death. The safety of vaccines is always being monitored. For  more information, visit: www.cdc.gov/vaccinesafety/ 5. What if there is a serious reaction? What should I look for?  Look for anything that concerns you, such as signs of a severe allergic reaction, very high fever, or unusual behavior. Signs of a severe allergic reaction can include hives, swelling of the face and throat, difficulty breathing, a fast heartbeat, dizziness, and weakness. These would start a few minutes to a few hours after the vaccination. What should I do?  If you think it is a severe allergic reaction or other emergency that can't wait, call 9-1-1 and get the person to the nearest hospital. Otherwise, call your doctor.  Reactions should be reported to the Vaccine Adverse Event Reporting System (VAERS). Your doctor should file this report, or you can do it yourself through the VAERS web site at www.vaers.hhs.gov, or by calling 1-800-822-7967. VAERS does not give medical advice. 6. The National Vaccine Injury Compensation Program The National Vaccine Injury Compensation Program (VICP) is a federal program that was created to compensate people who may have been   injured by certain vaccines. Persons who believe they may have been injured by a vaccine can learn about the program and about filing a claim by calling 1-800-338-2382 or visiting the VICP website at www.hrsa.gov/vaccinecompensation. There is a time limit to file a claim for compensation. 7. How can I learn more?  Ask your healthcare provider. He or she can give you the vaccine package insert or suggest other sources of information.  Call your local or state health department.  Contact the Centers for Disease Control and Prevention (CDC):  Call 1-800-232-4636 (1-800-CDC-INFO) or  Visit CDC's website at www.cdc.gov/flu Vaccine Information Statement Inactivated Influenza Vaccine (07/11/2014)   This information is not intended to replace advice given to you by your health care provider. Make sure you discuss any  questions you have with your health care provider.   Document Released: 09/15/2006 Document Revised: 12/12/2014 Document Reviewed: 07/14/2014 Elsevier Interactive Patient Education 2016 Elsevier Inc.  

## 2016-09-23 ENCOUNTER — Other Ambulatory Visit: Payer: Self-pay

## 2016-09-23 LAB — HEMOGLOBIN A1C
HEMOGLOBIN A1C: 7.4 % — AB (ref <5.7)
MEAN PLASMA GLUCOSE: 166 mg/dL

## 2016-09-23 MED ORDER — LDR BLOOD GLUCOSE TRUETEST W/DEVICE KIT: 1.0000 | PACK | Freq: Every day | 0 refills | Status: DC

## 2016-09-23 MED ORDER — LANCETS MISC: 3 refills | Status: DC

## 2016-09-23 MED ORDER — GLUCOSE BLOOD VI STRP: ORAL_STRIP | 3 refills | Status: DC

## 2016-09-23 NOTE — Telephone Encounter (Signed)
Pharm faxed note that pt's ins preferred Truetest or Accu-check, or Prodigy. The only brand I saw in our system with all supplies showing "preferred" was Truetest so I sent that brand.

## 2016-10-03 ENCOUNTER — Encounter: Payer: Self-pay | Admitting: *Deleted

## 2016-12-24 ENCOUNTER — Other Ambulatory Visit: Payer: Self-pay | Admitting: Family Medicine

## 2017-01-12 ENCOUNTER — Encounter: Payer: Self-pay | Admitting: Family Medicine

## 2017-01-12 ENCOUNTER — Ambulatory Visit (INDEPENDENT_AMBULATORY_CARE_PROVIDER_SITE_OTHER): Payer: Medicare HMO | Admitting: Family Medicine

## 2017-01-12 VITALS — BP 186/82 | HR 81 | Temp 98.5°F | Resp 18 | Ht 63.5 in | Wt 202.5 lb

## 2017-01-12 DIAGNOSIS — M109 Gout, unspecified: Secondary | ICD-10-CM

## 2017-01-12 DIAGNOSIS — J309 Allergic rhinitis, unspecified: Secondary | ICD-10-CM | POA: Diagnosis not present

## 2017-01-12 DIAGNOSIS — I10 Essential (primary) hypertension: Secondary | ICD-10-CM

## 2017-01-12 DIAGNOSIS — E782 Mixed hyperlipidemia: Secondary | ICD-10-CM | POA: Diagnosis not present

## 2017-01-12 DIAGNOSIS — Z1159 Encounter for screening for other viral diseases: Secondary | ICD-10-CM

## 2017-01-12 DIAGNOSIS — E119 Type 2 diabetes mellitus without complications: Secondary | ICD-10-CM | POA: Diagnosis not present

## 2017-01-12 MED ORDER — HYDROCODONE-ACETAMINOPHEN 5-325 MG PO TABS: 1.0000 | ORAL_TABLET | Freq: Four times a day (QID) | ORAL | 0 refills | Status: DC | PRN

## 2017-01-12 MED ORDER — CLONIDINE HCL 0.2 MG PO TABS: ORAL_TABLET | ORAL | 1 refills | Status: DC

## 2017-01-12 MED ORDER — GLIPIZIDE 10 MG PO TABS: ORAL_TABLET | ORAL | 1 refills | Status: DC

## 2017-01-12 MED ORDER — FLUTICASONE PROPIONATE 50 MCG/ACT NA SUSP: 1.0000 | Freq: Every day | NASAL | 6 refills | Status: DC

## 2017-01-12 MED ORDER — ALLOPURINOL 100 MG PO TABS: 200.0000 mg | ORAL_TABLET | Freq: Every day | ORAL | 1 refills | Status: DC

## 2017-01-12 MED ORDER — COLCHICINE 0.6 MG PO TABS: ORAL_TABLET | ORAL | 0 refills | Status: DC

## 2017-01-12 MED ORDER — METFORMIN HCL 1000 MG PO TABS: 1000.0000 mg | ORAL_TABLET | Freq: Two times a day (BID) | ORAL | 1 refills | Status: DC

## 2017-01-12 MED ORDER — SITAGLIPTIN PHOSPHATE 100 MG PO TABS: 100.0000 mg | ORAL_TABLET | Freq: Every day | ORAL | 1 refills | Status: DC

## 2017-01-12 MED ORDER — METOPROLOL SUCCINATE ER 200 MG PO TB24: ORAL_TABLET | ORAL | 1 refills | Status: DC

## 2017-01-12 MED ORDER — ATORVASTATIN CALCIUM 20 MG PO TABS: 20.0000 mg | ORAL_TABLET | Freq: Every day | ORAL | 1 refills | Status: DC

## 2017-01-12 MED ORDER — AMLODIPINE BESYLATE 10 MG PO TABS: 10.0000 mg | ORAL_TABLET | Freq: Every day | ORAL | 1 refills | Status: DC

## 2017-01-12 MED ORDER — VALSARTAN 320 MG PO TABS: 320.0000 mg | ORAL_TABLET | Freq: Every day | ORAL | 1 refills | Status: DC

## 2017-01-12 NOTE — Progress Notes (Signed)
By signing my name below, I, [name], attest that this documentation has been prepared under the direction and in the presence of [name], MD.  Electronically Signed: [name], Medical Scribe. 01/12/17. 3:54 PM.  Subjective:    Patient ID: Susan Miles, female    DOB: Nov 02, 1946, 71 y.o.   MRN: [id number]  HPI Chief Complaint  Patient presents with  . Follow-up    BLOOD PRESSURE    HPI Comments: Susan Miles is a 71 y.o. female who presents to the [facility] for follow-up. Last office visit was Oct 19th - bp 130/88 and olerating medications at that time. Pt ran out of all of her medications and she's out of hydrocodone or colchicine (she takes them rarely)  HTN: Takes losartan, metoprolol, catapres and norvasc. Pt is compliant with her medication and her bp ranges 160-170/90s.  Lab Results  Component Value Date   CREATININE 0.91 09/22/2016   Wt Readings from Last 3 Encounters:  01/12/17 202 lb 8 oz (91.9 kg)  09/22/16 206 lb 6.4 oz (93.6 kg)  06/23/16 204 lb (92.5 kg)   BP Readings from Last 3 Encounters:  01/12/17 (!) 186/82  09/22/16 130/88  06/23/16 122/80   HLD: Pt is compliant with lipitor 20 mg QD. Lab Results  Component Value Date   ALT 16 09/22/2016   AST 17 09/22/2016   ALKPHOS 71 09/22/2016   BILITOT 0.6 09/22/2016   Lab Results  Component Value Date   CHOL 148 09/22/2016   HDL 45 (L) 09/22/2016   LDLCALC 87 09/22/2016   TRIG 82 09/22/2016   CHOLHDL 3.3 09/22/2016   DM: Weight has decreased as above. She was exercising and working on diet at last visit. No changes in medications as she was improved in diet and exercise. Here for repeat labs today. Ophthalmologist April 2017. Pt is compliant with glipizide 10 mg BID, metformin 1000 mg BID, and januvia QD. Pt is followed by the ophthalmologist with a upcoming appt and she's followed by a dentist. Pt exercises 3x a week without chest pain, SOB, or difficulty  breathing. Lab Results  Component Value Date   HGBA1C 7.4 (H) 09/22/2016   Lab Results  Component Value Date   MICROALBUR 7.5 03/23/2016   Gout: Reports first flare in the past 3 month that occured last weekend in her left foot on around her 5th phalangy and still currently hurts. Allopurinol was increased Oct 2017 due to increase of attacks, she's now on 200 mg allopurinol. Denies recent fall or injury to her foot. Denies experiencing new side effects from her medication.  Health Maintenance: She is over due for tdap and needs Hep C screening. Pt isn't sure when she had her last tetanus vaccine, but doubts her last vaccine is earlier than the one on record. Pt agrees to Hep C screening today.  Allergies: Reports eye lid edema, and rhinorrhea with cough for the past 2-3 months. Took zyrtec and saline spray with little relief of her sxs. She also notes experiencing epistaxis "a couple of weeks ago".  Patient Active Problem List   Diagnosis Date Noted  . Breast asymmetry 05/01/2015  . Cataract, nuclear 05/24/2012  . HTN (hypertension) 02/10/2012  . DM2 (diabetes mellitus, type 2) ([place]) 02/10/2012   History reviewed. No pertinent past medical history. History reviewed. No pertinent surgical history. Allergies  Allergen Reactions  . Ace Inhibitors Cough   Prior to Admission medications   Medication Sig Start Date End  Date Taking? Authorizing Provider  allopurinol (ZYLOPRIM) 100 MG tablet Take 2 tablets (200 mg total) by mouth daily. 09/22/16   [name], MD  amLODipine (NORVASC) 5 MG tablet Take 1 tablet (5 mg total) by mouth daily. 09/22/16   [name], MD  aspirin 81 MG tablet Take 81 mg by mouth daily.    Historical Provider, MD  atorvastatin (LIPITOR) 20 MG tablet Take 1 tablet (20 mg total) by mouth daily at 6 PM. 09/22/16   [name], MD  Blood Glucose Monitoring Suppl (LDR BLOOD GLUCOSE TRUETEST) w/Device KIT 1 Device by Does not apply route daily. Test  blood sugar once daily. Dx: E11.9 09/23/16   [name], MD  cetirizine (ZYRTEC) 10 MG tablet Take 1 tablet (10 mg total) by mouth daily. 09/21/15   [name], MD  cloNIDine (CATAPRES) 0.2 MG tablet TAKE 1 TABLET (0.2 MG TOTAL) BY MOUTH 2 TIMES DAILY 09/22/16   [name], MD  colchicine 0.6 MG tablet 2 tablets once, then 1 tablet in 1 hour if needed for pain. No further doses per attack. 12/28/15   [name], MD  glipiZIDE (GLUCOTROL) 10 MG tablet TAKE 2 TABLETS (20 MG TOTAL) BY MOUTH 2 (TWO) TIMES DAILY BEFORE A MEAL. 09/22/16   [name], MD  glipiZIDE (GLUCOTROL) 10 MG tablet TAKE 2 TABLETS (20 MG TOTAL) BY MOUTH 2 (TWO) TIMES DAILY BEFORE A MEAL. 12/24/16   [name], MD  glucose blood (TRUETEST TEST) test strip Test blood sugar once daily. Dx: E11.9 09/23/16   [name], MD  HYDROcodone-acetaminophen (NORCO) 5-325 MG tablet Take 1 tablet by mouth every 6 (six) hours as needed for moderate pain. 04/09/16   Robyn Haber, MD  Lancets MISC Test blood sugar once daily. Dx: E11.9 09/23/16   [name], MD  metFORMIN (GLUCOPHAGE) 1000 MG tablet Take 1 tablet (1,000 mg total) by mouth 2 (two) times daily with a meal. 09/22/16   [name], MD  metoprolol (TOPROL-XL) 200 MG 24 hr tablet TAKE 1 TABLET (200 MG TOTAL) BY MOUTH DAILY. 09/22/16   [name], MD  sitaGLIPtin (JANUVIA) 100 MG tablet Take 1 tablet (100 mg total) by mouth daily. 09/22/16   [name], MD  valsartan (DIOVAN) 320 MG tablet Take 1 tablet (320 mg total) by mouth daily. 09/22/16   [name], MD   Social History   Social History  . Marital status: Married    Spouse name: N/A  . Number of children: N/A  . Years of education: N/A   Occupational History  . Not on file.   Social History Main Topics  . Smoking status: Never Smoker  . Smokeless tobacco: Never Used  . Alcohol use No  . Drug use: No  . Sexual activity: Not on file   Other Topics  Concern  . Not on file   Social History Narrative  . No narrative on file   Review of Systems  HENT: Positive for nosebleeds and rhinorrhea.   Respiratory: Positive for cough. Negative for shortness of breath.   Cardiovascular: Negative for chest pain.  Musculoskeletal: Positive for arthralgias and joint swelling.  Skin: Negative for color change, rash and wound.  Allergic/Immunologic: Positive for environmental allergies.   Objective:  Physical Exam  Constitutional: She appears well-developed and well-nourished. No distress.  HENT:  Head: Normocephalic and atraumatic.  Nose: Mucosal edema (bilaterally) present.  Mouth/Throat: Oropharynx is clear and  moist. No oropharyngeal exudate or posterior oropharyngeal edema.  Eyes: Conjunctivae are normal.  Neck: Neck supple.  Cardiovascular: Normal rate, regular rhythm and normal heart sounds.  Exam reveals no friction rub.   No murmur heard. Pulmonary/Chest: Breath sounds normal. No respiratory distress. She has no wheezes. She has no rales.  Musculoskeletal:  Tender along the 5th metatarsal Ankle and remainder of foot non tender Skin intact Slight warmth along infected area No significant callous or wound on feet  Neurological: She is alert.  Skin: Skin is warm and dry.  Psychiatric: She has a normal mood and affect. Her behavior is normal.  Nursing note and vitals reviewed.  BP (!) 186/82 (BP Location: Right Arm, Patient Position: Sitting, Cuff Size: Large)   Pulse 81   Temp 98.5 F (36.9 C) (Oral)   Resp 18   Ht 5' 3.5" (1.613 m)   Wt 202 lb 8 oz (91.9 kg)   SpO2 95%   BMI 35.31 kg/m  Assessment & Plan:   Susan Miles is a 71 y.o. female Type 2 diabetes mellitus without complication, without long-term current use of insulin ([place]) - Plan: Hemoglobin A1C, metFORMIN (GLUCOPHAGE) 1000 MG tablet, sitaGLIPtin (JANUVIA) 100 MG tablet, glipiZIDE (GLUCOTROL) 10 MG tablet  - borderline control prior. Continue to work on diet and  exercise. Check A1c toi see if med change needed as borderline control prior.   Essential hypertension - Plan: amLODipine (NORVASC) 10 MG tablet, valsartan (DIOVAN) 320 MG tablet, metoprolol (TOPROL-XL) 200 MG 24 hr tablet, cloNIDine (CATAPRES) 0.2 MG tablet  - decreased control.   -increase norvasc to 18m, hypotensive precautions.   -continue same dose other meds for now. recehck in 3 months.   Acute gout of left foot, unspecified cause - Plan: Uric Acid, colchicine 0.6 MG tablet, Gout of left foot, unspecified cause, unspecified chronicity - Plan: allopurinol (ZYLOPRIM) 100 MG tablet  - suspected recurrence of gout. Improving. If not continuing to improve - return for xrays as pain in 5th metatarsal.   -check urica acid, continue allopurinol same dose for now.   Mixed hyperlipidemia - Plan: atorvastatin (LIPITOR) 20 MG tablet  - tolerating Lipitor, no change for now.   Allergic rhinitis, unspecified chronicity, unspecified seasonality, unspecified trigger - Plan: fluticasone (FLONASE) 50 MCG/ACT nasal spray  - flonase NS prescribed, cont zyrtec otc, saline ns if needed, rtc precautions if worse/persistent.   Need for hepatitis C screening test - Plan: Hepatitis C antibody   Meds ordered this encounter  Medications  . amLODipine (NORVASC) 10 MG tablet    Sig: Take 1 tablet (10 mg total) by mouth daily.    Dispense:  90 tablet    Refill:  1  . metFORMIN (GLUCOPHAGE) 1000 MG tablet    Sig: Take 1 tablet (1,000 mg total) by mouth 2 (two) times daily with a meal.    Dispense:  180 tablet    Refill:  1  . valsartan (DIOVAN) 320 MG tablet    Sig: Take 1 tablet (320 mg total) by mouth daily.    Dispense:  90 tablet    Refill:  1  . sitaGLIPtin (JANUVIA) 100 MG tablet    Sig: Take 1 tablet (100 mg total) by mouth daily.    Dispense:  90 tablet    Refill:  1    PT REQUESTING 90 DAY SUPPLY  . metoprolol (TOPROL-XL) 200 MG 24 hr tablet    Sig: TAKE 1 TABLET (200 MG TOTAL) BY MOUTH  DAILY.  Dispense:  90 tablet    Refill:  1  . HYDROcodone-acetaminophen (NORCO) 5-325 MG tablet    Sig: Take 1 tablet by mouth every 6 (six) hours as needed for moderate pain.    Dispense:  20 tablet    Refill:  0  . colchicine 0.6 MG tablet    Sig: 2 tablets once, then 1 tablet in 1 hour if needed for pain. No further doses per attack.    Dispense:  30 tablet    Refill:  0  . glipiZIDE (GLUCOTROL) 10 MG tablet    Sig: TAKE 2 TABLETS (20 MG TOTAL) BY MOUTH 2 (TWO) TIMES DAILY BEFORE A MEAL.    Dispense:  360 tablet    Refill:  1  . cloNIDine (CATAPRES) 0.2 MG tablet    Sig: TAKE 1 TABLET (0.2 MG TOTAL) BY MOUTH 2 TIMES DAILY    Dispense:  180 tablet    Refill:  1  . atorvastatin (LIPITOR) 20 MG tablet    Sig: Take 1 tablet (20 mg total) by mouth daily at 6 PM.    Dispense:  90 tablet    Refill:  1  . allopurinol (ZYLOPRIM) 100 MG tablet    Sig: Take 2 tablets (200 mg total) by mouth daily.    Dispense:  180 tablet    Refill:  1  . fluticasone (FLONASE) 50 MCG/ACT nasal spray    Sig: Place 1-2 sprays into both nostrils daily.    Dispense:  16 g    Refill:  6   Patient Instructions   Increase norvasc to 60m once per day. If any lightheadedness/dizziness, decrease to 1/2 dose (541m and return to discuss options.   I will check gout test again. meds refilled if needed. Continue allopurinol at 20063mach day. If foot pain is not improving in the next 1-2 weeks, or any worsening sooner, return for recheck and possible x-ray.  Try flonase nasal spray and Zyrtec for allergies. Saline nasal spray is still ok to use.  Return to the clinic or go to the nearest emergency room if any of your symptoms worsen or new symptoms occur.  Continue exercise and watching diet. I will let you know when we have the diabetes test back to see if other medication changes are needed.    IF you received an x-ray today, [facility]. Please contact [facility] at [phone] questions or concerns regarding your invoice.   IF you received labwork today, you will receive an invoice from [facility] contact [facility] at [phone] questions or concerns regarding your invoice.   Our billing staff will not be able to assist you with questions regarding bills from these companies.  You will be contacted with the lab results as soon as they are available. The fastest way to get your results is to activate your My Chart account. Instructions are located on the last page of this paperwork. If you have not heard from us [place] the results in 2 weeks, please contact this office.

## 2017-01-12 NOTE — Patient Instructions (Addendum)
Increase norvasc to 10mg  once per day. If any lightheadedness/dizziness, decrease to 1/2 dose (5mg ) and return to discuss options.   I will check gout test again. meds refilled if needed. Continue allopurinol at 200mg  each day. If foot pain is not improving in the next 1-2 weeks, or any worsening sooner, return for recheck and possible x-ray.  Try flonase nasal spray and Zyrtec for allergies. Saline nasal spray is still ok to use.  Return to the clinic or go to the nearest emergency room if any of your symptoms worsen or new symptoms occur.  Continue exercise and watching diet. I will let you know when we have the diabetes test back to see if other medication changes are needed.    IF you received an x-ray today, you will receive an invoice from [facility]. Please contact [facility] at [phone] with questions or concerns regarding your invoice.   IF you received labwork today, you will receive an invoice from [place]. Please contact [facility] at [phone] with questions or concerns regarding your invoice.   Our billing staff will not be able to assist you with questions regarding bills from these companies.  You will be contacted with the lab results as soon as they are available. The fastest way to get your results is to activate your My Chart account. Instructions are located on the last page of this paperwork. If you have not heard from [place] regarding the results in 2 weeks, please contact this office.

## 2017-01-13 LAB — HEPATITIS C ANTIBODY: Hep C Virus Ab: <0.1 s/co ratio (ref 0.0–0.9)

## 2017-01-13 LAB — URIC ACID: Uric Acid: 3.9 mg/dL (ref 2.5–7.1)

## 2017-01-13 LAB — HEMOGLOBIN A1C
ESTIMATED AVERAGE GLUCOSE: 177 mg/dL
Hgb A1c MFr Bld: 7.8 % — ABNORMAL HIGH (ref 4.8–5.6)

## 2017-02-08 DIAGNOSIS — L02211 Cutaneous abscess of abdominal wall: Secondary | ICD-10-CM | POA: Diagnosis not present

## 2017-02-08 DIAGNOSIS — E119 Type 2 diabetes mellitus without complications: Secondary | ICD-10-CM | POA: Diagnosis not present

## 2017-02-08 DIAGNOSIS — I10 Essential (primary) hypertension: Secondary | ICD-10-CM | POA: Diagnosis not present

## 2017-02-08 DIAGNOSIS — Z86010 Personal history of colonic polyps: Secondary | ICD-10-CM | POA: Diagnosis not present

## 2017-02-16 DIAGNOSIS — Z86010 Personal history of colonic polyps: Secondary | ICD-10-CM | POA: Diagnosis not present

## 2017-02-16 DIAGNOSIS — K5730 Diverticulosis of large intestine without perforation or abscess without bleeding: Secondary | ICD-10-CM | POA: Diagnosis not present

## 2017-03-29 ENCOUNTER — Other Ambulatory Visit: Payer: Self-pay | Admitting: Family Medicine

## 2017-03-29 DIAGNOSIS — H5213 Myopia, bilateral: Secondary | ICD-10-CM | POA: Diagnosis not present

## 2017-03-29 DIAGNOSIS — H52223 Regular astigmatism, bilateral: Secondary | ICD-10-CM | POA: Diagnosis not present

## 2017-03-29 DIAGNOSIS — I10 Essential (primary) hypertension: Secondary | ICD-10-CM | POA: Diagnosis not present

## 2017-03-29 DIAGNOSIS — H25099 Other age-related incipient cataract, unspecified eye: Secondary | ICD-10-CM | POA: Diagnosis not present

## 2017-03-29 DIAGNOSIS — H524 Presbyopia: Secondary | ICD-10-CM | POA: Diagnosis not present

## 2017-03-29 DIAGNOSIS — Z7984 Long term (current) use of oral hypoglycemic drugs: Secondary | ICD-10-CM | POA: Diagnosis not present

## 2017-03-29 DIAGNOSIS — E119 Type 2 diabetes mellitus without complications: Secondary | ICD-10-CM | POA: Diagnosis not present

## 2017-03-29 LAB — HM DIABETES EYE EXAM

## 2017-04-13 ENCOUNTER — Ambulatory Visit: Payer: Medicare HMO | Admitting: Family Medicine

## 2017-04-27 ENCOUNTER — Ambulatory Visit (INDEPENDENT_AMBULATORY_CARE_PROVIDER_SITE_OTHER): Payer: Medicare HMO | Admitting: Family Medicine

## 2017-04-27 VITALS — BP 171/93 | HR 64 | Temp 97.9°F | Resp 18 | Ht 63.5 in | Wt 192.2 lb

## 2017-04-27 DIAGNOSIS — I10 Essential (primary) hypertension: Secondary | ICD-10-CM

## 2017-04-27 DIAGNOSIS — E119 Type 2 diabetes mellitus without complications: Secondary | ICD-10-CM | POA: Diagnosis not present

## 2017-04-27 DIAGNOSIS — Z794 Long term (current) use of insulin: Secondary | ICD-10-CM

## 2017-04-27 DIAGNOSIS — E785 Hyperlipidemia, unspecified: Secondary | ICD-10-CM | POA: Diagnosis not present

## 2017-04-27 DIAGNOSIS — Z6833 Body mass index (BMI) 33.0-33.9, adult: Secondary | ICD-10-CM

## 2017-04-27 DIAGNOSIS — E669 Obesity, unspecified: Secondary | ICD-10-CM

## 2017-04-27 MED ORDER — AMLODIPINE BESYLATE 10 MG PO TABS: 10.0000 mg | ORAL_TABLET | Freq: Every day | ORAL | 1 refills | Status: DC

## 2017-04-27 NOTE — Patient Instructions (Addendum)
Congratulations on the weight loss, keep up the good work.  Elevated blood pressure today may be due to lower dose of amlodipine. Restart 10 mg once per day. Check your blood pressure outside of the office over the next 3-4 weeks, and update me with those numbers. If still running over 140/90, will likely need to change medication as I want you to be lower with diabetes. No other medication changes for now.  Recheck in 3 months.   IF you received an x-ray today, you will receive an invoice from [facility]. Please contact [facility] at [phone] with questions or concerns regarding your invoice.   IF you received labwork today, you will receive an invoice from [place]. Please contact [facility] at [phone] with questions or concerns regarding your invoice.   Our billing staff will not be able to assist you with questions regarding bills from these companies.  You will be contacted with the lab results as soon as they are available. The fastest way to get your results is to activate your My Chart account. Instructions are located on the last page of this paperwork. If you have not heard from [place] regarding the results in 2 weeks, please contact this office.

## 2017-04-27 NOTE — Progress Notes (Signed)
Subjective:  By signing my name below, I, Susan Miles, attest that this documentation has been prepared under the direction and in the presence of [name], MD. Electronically Signed: Moises Miles, [place]. 04/27/2017 , 12:15 PM .  Patient was seen in Room 27 .   Patient ID: Susan Miles, female    DOB: [birth year], 71 y.o.   MRN: [id number] Chief Complaint  Patient presents with  . Follow-up    3 months check up  . Diabetes   HPI Susan Miles is a 71 y.o. female Here for 3 month follow up of diabetes.   DM Lab Results  Component Value Date   HGBA1C 7.8 (H) 01/12/2017   Lab Results  Component Value Date   MICROALBUR 7.5 03/23/2016   Her A1C was elevated at Feb visit. We discussed new medication but planned on increasing diet changes and activity. She is taking metformin 1032m BID, Januvia 1077mQD, and glipizide 1084mID.   Wt Readings from Last 3 Encounters:  04/27/17 192 lb 3.2 oz (87.2 kg)  01/12/17 202 lb 8 oz (91.9 kg)  09/22/16 206 lb 6.4 oz (93.6 kg)   Body mass index is 33.51 kg/m.  She has loss weight since last visit. She hasn't been able to check her Miles sugars at home because her glucometer has ran out of batteries. She denies any symptomatic lows; although she notes feeling drowsy in the morning.   HLD Lab Results  Component Value Date   CHOL 148 09/22/2016   HDL 45 (L) 09/22/2016   LDLCALC 87 09/22/2016   TRIG 82 09/22/2016   CHOLHDL 3.3 09/22/2016   Lab Results  Component Value Date   ALT 16 09/22/2016   AST 17 09/22/2016   ALKPHOS 71 09/22/2016   BILITOT 0.6 09/22/2016   She takes lipitor 24m48m. She denies any complications with this medication.   Gout Her uric acid was normal last visit. She takes allopurinol 200mg86m   HTN Her BP is elevated again today; BP at last visit was also elevated at 186/82. Increased her norvasc to 10mg 58mnd continued same doses of her other medications, including clonidine 0.2mg BI18mdiovan  324mg QD36mprol 200mg and19mirin 81mg QD. 13mtient Active Problem List   Diagnosis Date Noted  . Breast asymmetry 05/01/2015  . Cataract, nuclear 05/24/2012  . HTN (hypertension) 02/10/2012  . DM2 (diabetes mellitus, type 2) (HCC) 03/08[place]13   No past medical history on file. No past surgical history on file. Allergies  Allergen Reactions  . Ace Inhibitors Cough   Prior to Admission medications   Medication Sig Start Date End Date Taking? Authorizing Provider  allopurinol (ZYLOPRIM) 100 MG tablet Take 2 tablets (200 mg total) by mouth daily. 01/12/17  Yes Esthefany Herrig, JeWendie AgresteDipine (NORVASC) 10 MG tablet Take 1 tablet (10 mg total) by mouth daily. 01/12/17  Yes Kreston Ahrendt, JeWendie AgresteDipine (NORVASC) 5 MG tablet TAKE 1 TABLET BY MOUTH DAILY 03/30/17  Yes Aowyn Rozeboom, JeWendie Agresterin 81 MG tablet Take 81 mg by mouth daily.   Yes [provider]  atorvastatin (LIPITOR) 20 MG tablet Take 1 tablet (20 mg total) by mouth daily at 6 PM. 01/12/17  Yes Braleigh Massoud, JeWendie Agrested Glucose Monitoring Suppl (LDR Miles GLUCOSE TRUETEST) w/Device KIT 1 Device by Does not apply route daily. Test Miles sugar once daily. Dx: E11.9 09/23/16  Yes Amberly Livas, JeWendie Agresterizine (ZYRTEC)Alethia Berthold  10 MG tablet Take 1 tablet (10 mg total) by mouth daily. 09/21/15  Yes [name], MD  cloNIDine (CATAPRES) 0.2 MG tablet TAKE 1 TABLET (0.2 MG TOTAL) BY MOUTH 2 TIMES DAILY 01/12/17  Yes [name], MD  colchicine 0.6 MG tablet 2 tablets once, then 1 tablet in 1 hour if needed for pain. No further doses per attack. 01/12/17  Yes [name], MD  fluticasone [facility]) 50 MCG/ACT nasal spray Place 1-2 sprays into both nostrils daily. 01/12/17  Yes [name], MD  glipiZIDE (GLUCOTROL) 10 MG tablet TAKE 2 TABLETS (20 MG TOTAL) BY MOUTH 2 (TWO) TIMES DAILY BEFORE A MEAL. 01/12/17  Yes [name], MD  glucose Miles (TRUETEST TEST) test strip Test Miles sugar once daily. Dx: E11.9  09/23/16  Yes [name], MD  HYDROcodone-acetaminophen (NORCO) 5-325 MG tablet Take 1 tablet by mouth every 6 (six) hours as needed for moderate pain. 01/12/17  Yes [name], MD  Lancets MISC Test Miles sugar once daily. Dx: E11.9 09/23/16  Yes [name], MD  metFORMIN (GLUCOPHAGE) 1000 MG tablet Take 1 tablet (1,000 mg total) by mouth 2 (two) times daily with a meal. 01/12/17  Yes [name], MD  metoprolol (TOPROL-XL) 200 MG 24 hr tablet TAKE 1 TABLET (200 MG TOTAL) BY MOUTH DAILY. 01/12/17  Yes [name], MD  sitaGLIPtin (JANUVIA) 100 MG tablet Take 1 tablet (100 mg total) by mouth daily. 01/12/17  Yes [name], MD  valsartan (DIOVAN) 320 MG tablet Take 1 tablet (320 mg total) by mouth daily. 01/12/17  Yes [name], MD   Social History   Social History  . Marital status: Married    Spouse name: N/A  . Number of children: N/A  . Years of education: N/A   Occupational History  . Not on file.   Social History Main Topics  . Smoking status: Never Smoker  . Smokeless tobacco: Never Used  . Alcohol use No  . Drug use: No  . Sexual activity: Not on file   Other Topics Concern  . Not on file   Social History Narrative  . No narrative on file   Review of Systems  Constitutional: Negative for fatigue and unexpected weight change.  Respiratory: Negative for chest tightness and shortness of breath.   Cardiovascular: Negative for chest pain, palpitations and leg swelling.  Gastrointestinal: Negative for abdominal pain and Miles in stool.  Neurological: Negative for dizziness, syncope, light-headedness and headaches.       Objective:   Physical Exam  Constitutional: She is oriented to person, place, and time. She appears well-developed and well-nourished.  HENT:  Head: Normocephalic and atraumatic.  Eyes: Conjunctivae and EOM are normal. Pupils are equal, round, and reactive to light.  Neck: Carotid bruit is not present.    Cardiovascular: Normal rate, regular rhythm, normal heart sounds and intact distal pulses.   Pulmonary/Chest: Effort normal and breath sounds normal.  Abdominal: Soft. She exhibits no pulsatile midline mass. There is no tenderness.  Neurological: She is alert and oriented to person, place, and time.  Skin: Skin is warm and dry.  Psychiatric: She has a normal mood and affect. Her behavior is normal.  Vitals reviewed.   Vitals:   04/27/17 1112 04/27/17 1128  BP: (!) 167/91 (!) 171/93  Pulse: 64   Resp: 18   Temp: 97.9 F (36.6 C)   TempSrc: Oral   SpO2: 96%  Weight: 192 lb 3.2 oz (87.2 kg)   Height: 5' 3.5" (1.613 m)       Assessment & Plan:   Susan Miles is a 70 y.o. female Type 2 diabetes mellitus without complication, with long-term current use of insulin ([place]) - Plan: HM Diabetes Foot Exam, Hemoglobin A1c, Microalbumin, urine, Care order/instruction: Class 1 obesity without serious comorbidity with body mass index (BMI) of 33.0 to 33.9 in adult, unspecified obesity type  -improved weight with Diet and exercise. Commended on her present efforts, and continued work on weight loss. Will check A1c to see if med changes needed at this point. No changes at this time. Tolerating medications currently. Check A1C.  Essential hypertension - Plan: amLODipine (NORVASC) 10 MG tablet  -Elevated again office, but may be in part due to mixup with still remaining on 5 mg amlodipine. Increase amlodipine to 10 mg, and with her weight loss/diet changes, that may be sufficient for improved control. Monitor home readings over the next 3-4 weeks. Continue same doses of other antihypertensives at this time  Hyperlipidemia, unspecified hyperlipidemia type - Plan: Comprehensive metabolic panel, Lipid panel  -Check lipids, CMP. Tolerating Lipitor 20 mg daily, continue same dose  Meds ordered this encounter  Medications  . amLODipine (NORVASC) 10 MG tablet    Sig: Take 1 tablet (10 mg total) by  mouth daily.    Dispense:  90 tablet    Refill:  1   Patient Instructions   Congratulations on the weight loss, keep up the good work.  Elevated Miles pressure today may be due to lower dose of amlodipine. Restart 10 mg once per day. Check your Miles pressure outside of the office over the next 3-4 weeks, and update me with those numbers. If still running over 140/90, will likely need to change medication as I want you to be lower with diabetes. No other medication changes for now.  Recheck in 3 months.   IF you received an x-ray today, you will receive an invoice from [facility]. Please contact [facility] at [phone] with questions or concerns regarding your invoice.   IF you received labwork today, you will receive an invoice from [place]. Please contact [facility] at [phone] with questions or concerns regarding your invoice.   Our billing staff will not be able to assist you with questions regarding bills from these companies.  You will be contacted with the lab results as soon as they are available. The fastest way to get your results is to activate your My Chart account. Instructions are located on the last page of this paperwork. If you have not heard from [place] regarding the results in 2 weeks, please contact this office.      I personally performed the services described in this documentation, which was scribed in my presence. The recorded information has been reviewed and considered for accuracy and completeness, addended by me as needed, and agree with information above.  Signed,   [name], MD [facility] [place].  04/27/17 6:48 PM

## 2017-04-28 LAB — COMPREHENSIVE METABOLIC PANEL
A/G RATIO: 1.4 (ref 1.2–2.2)
ALT: 15 IU/L (ref 0–32)
AST: 16 IU/L (ref 0–40)
Albumin: 4.1 g/dL (ref 3.5–4.8)
Alkaline Phosphatase: 75 IU/L (ref 39–117)
BUN/Creatinine Ratio: 11 — ABNORMAL LOW (ref 12–28)
BUN: 12 mg/dL (ref 8–27)
Bilirubin Total: 0.6 mg/dL (ref 0.0–1.2)
CALCIUM: 10.9 mg/dL — AB (ref 8.7–10.3)
CO2: 23 mmol/L (ref 18–29)
CREATININE: 1.11 mg/dL — AB (ref 0.57–1.00)
Chloride: 98 mmol/L (ref 96–106)
GFR calc non Af Amer: 50 mL/min/{1.73_m2} — ABNORMAL LOW (ref >59)
GFR, EST AFRICAN AMERICAN: 58 mL/min/{1.73_m2} — AB (ref >59)
GLOBULIN, TOTAL: 3 g/dL (ref 1.5–4.5)
Glucose: 157 mg/dL — ABNORMAL HIGH (ref 65–99)
POTASSIUM: 4.4 mmol/L (ref 3.5–5.2)
SODIUM: 135 mmol/L (ref 134–144)
Total Protein: 7.1 g/dL (ref 6.0–8.5)

## 2017-04-28 LAB — HEMOGLOBIN A1C
Est. average glucose Bld gHb Est-mCnc: 186 mg/dL
Hgb A1c MFr Bld: 8.1 % — ABNORMAL HIGH (ref 4.8–5.6)

## 2017-04-28 LAB — LIPID PANEL
Chol/HDL Ratio: 3.3 ratio (ref 0.0–4.4)
Cholesterol, Total: 122 mg/dL (ref 100–199)
HDL: 37 mg/dL — AB (ref >39)
LDL CALC: 68 mg/dL (ref 0–99)
Triglycerides: 87 mg/dL (ref 0–149)
VLDL Cholesterol Cal: 17 mg/dL (ref 5–40)

## 2017-05-10 ENCOUNTER — Other Ambulatory Visit: Payer: Self-pay | Admitting: Family Medicine

## 2017-05-10 DIAGNOSIS — Z1231 Encounter for screening mammogram for malignant neoplasm of breast: Secondary | ICD-10-CM

## 2017-06-02 ENCOUNTER — Other Ambulatory Visit: Payer: Self-pay | Admitting: Family Medicine

## 2017-06-02 DIAGNOSIS — M109 Gout, unspecified: Secondary | ICD-10-CM

## 2017-06-05 NOTE — Telephone Encounter (Signed)
Discussed last visit. Allopurinol refilled.

## 2017-06-09 ENCOUNTER — Ambulatory Visit
Admission: RE | Admit: 2017-06-09 | Discharge: 2017-06-09 | Disposition: A | Discharge status: Home / Self Care | Payer: Commercial Managed Care - HMO | Source: Ambulatory Visit | Attending: Family Medicine | Admitting: Family Medicine

## 2017-06-09 DIAGNOSIS — Z1231 Encounter for screening mammogram for malignant neoplasm of breast: Secondary | ICD-10-CM | POA: Diagnosis not present

## 2017-06-24 ENCOUNTER — Other Ambulatory Visit: Payer: Self-pay | Admitting: Family Medicine

## 2017-06-24 DIAGNOSIS — I10 Essential (primary) hypertension: Secondary | ICD-10-CM

## 2017-06-27 ENCOUNTER — Other Ambulatory Visit: Payer: Self-pay

## 2017-06-27 MED ORDER — FLUTICASONE PROPIONATE 50 MCG/ACT NA SUSP: 1.0000 | Freq: Every day | NASAL | 6 refills | Status: DC

## 2017-08-03 ENCOUNTER — Ambulatory Visit (INDEPENDENT_AMBULATORY_CARE_PROVIDER_SITE_OTHER): Payer: Medicare HMO

## 2017-08-03 ENCOUNTER — Encounter: Payer: Self-pay | Admitting: Family Medicine

## 2017-08-03 ENCOUNTER — Ambulatory Visit (INDEPENDENT_AMBULATORY_CARE_PROVIDER_SITE_OTHER): Payer: Medicare HMO | Admitting: Family Medicine

## 2017-08-03 VITALS — BP 138/90 | HR 69 | Temp 98.0°F | Resp 16 | Ht 64.0 in | Wt 193.0 lb

## 2017-08-03 VITALS — BP 149/82 | HR 69 | Temp 98.0°F | Ht 64.0 in | Wt 193.5 lb

## 2017-08-03 DIAGNOSIS — E785 Hyperlipidemia, unspecified: Secondary | ICD-10-CM | POA: Diagnosis not present

## 2017-08-03 DIAGNOSIS — Z23 Encounter for immunization: Secondary | ICD-10-CM

## 2017-08-03 DIAGNOSIS — M109 Gout, unspecified: Secondary | ICD-10-CM

## 2017-08-03 DIAGNOSIS — R609 Edema, unspecified: Secondary | ICD-10-CM

## 2017-08-03 DIAGNOSIS — I10 Essential (primary) hypertension: Secondary | ICD-10-CM

## 2017-08-03 DIAGNOSIS — Z0000 Encounter for general adult medical examination without abnormal findings: Secondary | ICD-10-CM | POA: Diagnosis not present

## 2017-08-03 DIAGNOSIS — E782 Mixed hyperlipidemia: Secondary | ICD-10-CM | POA: Diagnosis not present

## 2017-08-03 DIAGNOSIS — E1165 Type 2 diabetes mellitus with hyperglycemia: Secondary | ICD-10-CM | POA: Diagnosis not present

## 2017-08-03 DIAGNOSIS — E119 Type 2 diabetes mellitus without complications: Secondary | ICD-10-CM | POA: Diagnosis not present

## 2017-08-03 LAB — HEMOGLOBIN A1C
ESTIMATED AVERAGE GLUCOSE: 163 mg/dL
HEMOGLOBIN A1C: 7.3 % — AB (ref 4.8–5.6)

## 2017-08-03 MED ORDER — METFORMIN HCL 1000 MG PO TABS: 1000.0000 mg | ORAL_TABLET | Freq: Two times a day (BID) | ORAL | 1 refills | Status: DC

## 2017-08-03 MED ORDER — SITAGLIPTIN PHOSPHATE 100 MG PO TABS: 100.0000 mg | ORAL_TABLET | Freq: Every day | ORAL | 1 refills | Status: DC

## 2017-08-03 MED ORDER — CLONIDINE HCL 0.2 MG PO TABS: ORAL_TABLET | ORAL | 1 refills | Status: DC

## 2017-08-03 MED ORDER — ALLOPURINOL 100 MG PO TABS: 200.0000 mg | ORAL_TABLET | Freq: Every day | ORAL | 0 refills | Status: DC

## 2017-08-03 MED ORDER — AMLODIPINE BESYLATE 10 MG PO TABS: 10.0000 mg | ORAL_TABLET | Freq: Every day | ORAL | 1 refills | Status: DC

## 2017-08-03 MED ORDER — LOSARTAN POTASSIUM 100 MG PO TABS: 100.0000 mg | ORAL_TABLET | Freq: Every day | ORAL | 1 refills | Status: DC

## 2017-08-03 MED ORDER — GLIPIZIDE 10 MG PO TABS: ORAL_TABLET | ORAL | 1 refills | Status: DC

## 2017-08-03 MED ORDER — ATORVASTATIN CALCIUM 20 MG PO TABS: 20.0000 mg | ORAL_TABLET | Freq: Every day | ORAL | 1 refills | Status: DC

## 2017-08-03 NOTE — Patient Instructions (Addendum)
Susan Miles , Thank you for taking time to come for your Medicare Wellness Visit. I appreciate your ongoing commitment to your health goals. Please review the following plan we discussed and let me know if I can assist you in the future.   Screening recommendations/referrals: Colonoscopy: completed 02/03/2011, Patient states that she just had another one done 03/2017 and will have the specialist send the report over. Mammogram: completed 06/09/2017 Bone Density: completed 06/05/2015 Recommended yearly ophthalmology/optometry visit for glaucoma screening and checkup. Recommended yearly dental visit for hygiene and checkup.  Vaccinations: Influenza vaccine: Patient received flu shot today.  Pneumococcal vaccine:completed series  Tdap vaccine: Patient states she has had one in the past but can't remember the exact date. Unsure if it has been within the past 10 years. Shingles vaccine: completed 03/19/2014  Advanced directives:Please bring a copy of your POA (Power of [place]) and/or Living Will to your next appointment.    Conditions/risks identified: Patient will try to increase her water intake to 4-5 bottles daily.    Next appointment: Today 08/03/17 @ 9:20 am with Dr. Carlota Raspberry    Preventive Care 65 Years and Older, Female Preventive care refers to lifestyle choices and visits with your health care provider that can promote health and wellness. What does preventive care include?  A yearly physical exam. This is also called an annual well check.  Dental exams once or twice a year.  Routine eye exams. Ask your health care provider how often you should have your eyes checked.  Personal lifestyle choices, including:  Daily care of your teeth and gums.  Regular physical activity.  Eating a healthy diet.  Avoiding tobacco and drug use.  Limiting alcohol use.  Practicing safe sex.  Taking low-dose aspirin every day.  Taking vitamin and mineral supplements as recommended by your health  care provider. What happens during an annual well check? The services and screenings done by your health care provider during your annual well check will depend on your age, overall health, lifestyle risk factors, and family history of disease. Counseling  Your health care provider may ask you questions about your:  Alcohol use.  Tobacco use.  Drug use.  Emotional well-being.  Home and relationship well-being.  Sexual activity.  Eating habits.  History of falls.  Memory and ability to understand (cognition).  Work and work Statistician.  Reproductive health. Screening  You may have the following tests or measurements:  Height, weight, and BMI.  Blood pressure.  Lipid and cholesterol levels. These may be checked every 5 years, or more frequently if you are over 47 years old.  Skin check.  Lung cancer screening. You may have this screening every year starting at age 59 if you have a 30-pack-year history of smoking and currently smoke or have quit within the past 15 years.  Fecal occult blood test (FOBT) of the stool. You may have this test every year starting at age 33.  Flexible sigmoidoscopy or colonoscopy. You may have a sigmoidoscopy every 5 years or a colonoscopy every 10 years starting at age 72.  Hepatitis C blood test.  Hepatitis B blood test.  Sexually transmitted disease (STD) testing.  Diabetes screening. This is done by checking your blood sugar (glucose) after you have not eaten for a while (fasting). You may have this done every 1-3 years.  Bone density scan. This is done to screen for osteoporosis. You may have this done starting at age 90.  Mammogram. This may be done every 1-2 years.  Talk to your health care provider about how often you should have regular mammograms. Talk with your health care provider about your test results, treatment options, and if necessary, the need for more tests. Vaccines  Your health care provider may recommend certain  vaccines, such as:  Influenza vaccine. This is recommended every year.  Tetanus, diphtheria, and acellular pertussis (Tdap, Td) vaccine. You may need a Td booster every 10 years.  Zoster vaccine. You may need this after age 23.  Pneumococcal 13-valent conjugate (PCV13) vaccine. One dose is recommended after age 22.  Pneumococcal polysaccharide (PPSV23) vaccine. One dose is recommended after age 70. Talk to your health care provider about which screenings and vaccines you need and how often you need them. This information is not intended to replace advice given to you by your health care provider. Make sure you discuss any questions you have with your health care provider. Document Released: 12/18/2015 Document Revised: 08/10/2016 Document Reviewed: 09/22/2015 Elsevier Interactive Patient Education  2017 [place] Prevention in the Home Falls can cause injuries. They can happen to people of all ages. There are many things you can do to make your home safe and to help prevent falls. What can I do on the outside of my home?  Regularly fix the edges of walkways and driveways and fix any cracks.  Remove anything that might make you trip as you walk through a door, such as a raised step or threshold.  Trim any bushes or trees on the path to your home.  Use bright outdoor lighting.  Clear any walking paths of anything that might make someone trip, such as rocks or tools.  Regularly check to see if handrails are loose or broken. Make sure that both sides of any steps have handrails.  Any raised decks and porches should have guardrails on the edges.  Have any leaves, snow, or ice cleared regularly.  Use sand or salt on walking paths during winter.  Clean up any spills in your garage right away. This includes oil or grease spills. What can I do in the bathroom?  Use night lights.  Install grab bars by the toilet and in the tub and shower. Do not use towel bars as grab  bars.  Use non-skid mats or decals in the tub or shower.  If you need to sit down in the shower, use a plastic, non-slip stool.  Keep the floor dry. Clean up any water that spills on the floor as soon as it happens.  Remove soap buildup in the tub or shower regularly.  Attach bath mats securely with double-sided non-slip rug tape.  Do not have throw rugs and other things on the floor that can make you trip. What can I do in the bedroom?  Use night lights.  Make sure that you have a light by your bed that is easy to reach.  Do not use any sheets or blankets that are too big for your bed. They should not hang down onto the floor.  Have a firm chair that has side arms. You can use this for support while you get dressed.  Do not have throw rugs and other things on the floor that can make you trip. What can I do in the kitchen?  Clean up any spills right away.  Avoid walking on wet floors.  Keep items that you use a lot in easy-to-reach places.  If you need to reach something above you, use a strong step stool  that has a grab bar.  Keep electrical cords out of the way.  Do not use floor polish or wax that makes floors slippery. If you must use wax, use non-skid floor wax.  Do not have throw rugs and other things on the floor that can make you trip. What can I do with my stairs?  Do not leave any items on the stairs.  Make sure that there are handrails on both sides of the stairs and use them. Fix handrails that are broken or loose. Make sure that handrails are as long as the stairways.  Check any carpeting to make sure that it is firmly attached to the stairs. Fix any carpet that is loose or worn.  Avoid having throw rugs at the top or bottom of the stairs. If you do have throw rugs, attach them to the floor with carpet tape.  Make sure that you have a light switch at the top of the stairs and the bottom of the stairs. If you do not have them, ask someone to add them for  you. What else can I do to help prevent falls?  Wear shoes that:  Do not have high heels.  Have rubber bottoms.  Are comfortable and fit you well.  Are closed at the toe. Do not wear sandals.  If you use a stepladder:  Make sure that it is fully opened. Do not climb a closed stepladder.  Make sure that both sides of the stepladder are locked into place.  Ask someone to hold it for you, if possible.  Clearly mark and make sure that you can see:  Any grab bars or handrails.  First and last steps.  Where the edge of each step is.  Use tools that help you move around (mobility aids) if they are needed. These include:  Canes.  Walkers.  Scooters.  Crutches.  Turn on the lights when you go into a dark area. Replace any light bulbs as soon as they burn out.  Set up your furniture so you have a clear path. Avoid moving your furniture around.  If any of your floors are uneven, fix them.  If there are any pets around you, be aware of where they are.  Review your medicines with your doctor. Some medicines can make you feel dizzy. This can increase your chance of falling. Ask your doctor what other things that you can do to help prevent falls. This information is not intended to replace advice given to you by your health care provider. Make sure you discuss any questions you have with your health care provider. Document Released: 09/17/2009 Document Revised: 04/28/2016 Document Reviewed: 12/26/2014 Elsevier Interactive Patient Education  2017 [employer].

## 2017-08-03 NOTE — Progress Notes (Signed)
Subjective:   Susan Miles is a 71 y.o. female who presents for an Initial Medicare Annual Wellness Visit.  Review of Systems    N/A  Cardiac Risk Factors include: advanced age (>6mn, >>48women);diabetes mellitus;hypertension;obesity (BMI >30kg/m2)     Objective:    Today's Vitals   08/03/17 0832  BP: (!) 149/82  Pulse: 69  Temp: 98 F (36.7 C)  TempSrc: Oral  Weight: 193 lb 8 oz (87.8 kg)  Height: '5\' 4"'  (1.626 m)   Body mass index is 33.21 kg/m.   Current Medications (verified) Outpatient Encounter Prescriptions as of 08/03/2017  Medication Sig  . allopurinol (ZYLOPRIM) 100 MG tablet TAKE 2 TABLETS (200 MG TOTAL) BY MOUTH DAILY.  .Marland KitchenamLODipine (NORVASC) 10 MG tablet Take 1 tablet (10 mg total) by mouth daily.  .Marland Kitchenaspirin 81 MG tablet Take 81 mg by mouth daily.  .Marland Kitchenatorvastatin (LIPITOR) 20 MG tablet Take 1 tablet (20 mg total) by mouth daily at 6 PM.  . Blood Glucose Monitoring Suppl (LDR BLOOD GLUCOSE TRUETEST) w/Device KIT 1 Device by Does not apply route daily. Test blood sugar once daily. Dx: E11.9  . cetirizine (ZYRTEC) 10 MG tablet Take 1 tablet (10 mg total) by mouth daily.  . cloNIDine (CATAPRES) 0.2 MG tablet TAKE 1 TABLET (0.2 MG TOTAL) BY MOUTH 2 TIMES DAILY  . colchicine 0.6 MG tablet 2 tablets once, then 1 tablet in 1 hour if needed for pain. No further doses per attack.  . fluticasone (FLONASE) 50 MCG/ACT nasal spray Place 1-2 sprays into both nostrils daily.  .Marland KitchenglipiZIDE (GLUCOTROL) 10 MG tablet TAKE 2 TABLETS (20 MG TOTAL) BY MOUTH 2 (TWO) TIMES DAILY BEFORE A MEAL.  .Marland Kitchenglucose blood (TRUETEST TEST) test strip Test blood sugar once daily. Dx: E11.9  . HYDROcodone-acetaminophen (NORCO) 5-325 MG tablet Take 1 tablet by mouth every 6 (six) hours as needed for moderate pain.  . Lancets MISC Test blood sugar once daily. Dx: E11.9  . metFORMIN (GLUCOPHAGE) 1000 MG tablet Take 1 tablet (1,000 mg total) by mouth 2 (two) times daily with a meal.  . metoprolol  (TOPROL-XL) 200 MG 24 hr tablet TAKE 1 TABLET (200 MG TOTAL) BY MOUTH DAILY.  . sitaGLIPtin (JANUVIA) 100 MG tablet Take 1 tablet (100 mg total) by mouth daily.  . valsartan (DIOVAN) 320 MG tablet Take 1 tablet (320 mg total) by mouth daily.   No facility-administered encounter medications on file as of 08/03/2017.     Allergies (verified) Ace inhibitors   History: Past Medical History:  Diagnosis Date  . [place]   . Hypertension    History reviewed. No pertinent surgical history. Family History  Problem Relation Age of Onset  . Hypertension Mother   . Heart disease Father    Social History   Occupational History  . Not on file.   Social History Main Topics  . Smoking status: Never Smoker  . Smokeless tobacco: Never Used  . Alcohol use No  . Drug use: No  . Sexual activity: Not on file    Tobacco Counseling Counseling given: Not Answered   Activities of Daily Living In your present state of health, do you have any difficulty performing the following activities: 08/03/2017  Hearing? N  Vision? N  Difficulty concentrating or making decisions? N  Walking or climbing stairs? N  Dressing or bathing? N  Doing errands, shopping? N  Preparing Food and eating ? N  Using the Toilet? N  In the past six months, have you accidently leaked urine? N  Do you have problems with loss of bowel control? N  Managing your Medications? N  Managing your Finances? N  Housekeeping or managing your Housekeeping? N  Some recent data might be hidden    Immunizations and Health Maintenance Immunization History  Administered Date(s) Administered  . DTaP 02/03/2007  . Influenza Split 09/17/2012  . Influenza,inj,Quad PF,6+ Mos 09/30/2013, 07/28/2014, 09/21/2015, 09/22/2016, 08/03/2017  . Pneumococcal Conjugate-13 12/05/2010, 10/27/2014  . Pneumococcal Polysaccharide-23 09/21/2015  . Zoster 03/19/2014   There are no preventive care reminders to  display for this patient.  Patient Care Team: [name], MD as PCP - General (Family Medicine) Webb Laws, [place] as Referring Physician (Optometry)  Indicate any recent Medical Services you may have received from other than [facility] providers in the past year (date may be approximate).     Assessment:   This is a routine wellness examination for Susan Miles.   Hearing/Vision screen Vision Screening Comments: Patient sees Dr. Einar Gip for regular eye exams. She sees him yearly.   Dietary issues and exercise activities discussed: Current Exercise Habits: Home exercise routine, Type of exercise: treadmill (elliptical, exercise bike, weights), Time (Minutes): 45, Frequency (Times/Week): 2, Weekly Exercise (Minutes/Week): 90, Intensity: Moderate, Exercise limited by: None identified  Goals    . Increase water intake          Patient will try to increase her water intake to 4-5 bottles daily.       Depression Screen PHQ 2/9 Scores 08/03/2017 04/27/2017 01/12/2017 09/22/2016 06/23/2016 04/29/2016 04/15/2016  PHQ - 2 Score 0 0 0 0 0 0 0    Fall Risk Fall Risk  08/03/2017 08/03/2017 04/27/2017 01/12/2017 09/22/2016  Falls in the past year? No No No No No    Cognitive Function:     6CIT Screen 08/03/2017  What Year? 0 points  What month? 0 points  What time? 0 points  Count back from 20 0 points  Months in reverse 0 points  Repeat phrase 2 points  Total Score 2    Screening Tests Health Maintenance  Topic Date Due  . TETANUS/TDAP  08/03/2018 (Originally 02/02/2017)  . HEMOGLOBIN A1C  10/28/2017  . OPHTHALMOLOGY EXAM  04/13/2018  . FOOT EXAM  04/27/2018  . MAMMOGRAM  06/10/2019  . COLONOSCOPY  02/02/2021  . INFLUENZA VACCINE  Completed  . DEXA SCAN  Completed  . Hepatitis C Screening  Completed  . PNA vac Low Risk Adult  Completed      Plan:  I have personally reviewed and addressed the Medicare Annual Wellness questionnaire and have noted the following in the patient's chart:   A. Medical and social history B. Use of alcohol, tobacco or illicit drugs  C. Current medications and supplements D. Functional ability and status E.  Nutritional status F.  Physical activity G. Advance directives H. List of other physicians I.  Hospitalizations, surgeries, and ER visits in previous 12 months J.  [place] such as hearing and vision if needed, cognitive and depression L. Referrals and appointments - none  In addition, I have reviewed and discussed with patient certain preventive protocols, quality metrics, and best practice recommendations. A written personalized care plan for preventive services as well as general preventive health recommendations were provided to patient.    Signed,  Andrez Grime, LPN Nurse Health Advisor   MD Recommendations: none

## 2017-08-03 NOTE — Progress Notes (Signed)
 Subjective:  This chart was scribed for Susan Miles, Susan R, MD by Hatice Demirci,scribe, at [facility].  This patient was seen in room 25 and the patient's care was started at 9:46 AM.   Chief Complaint  Patient presents with  . Follow-up    DM     Patient ID: Susan Miles, female    DOB: 09/03/1946, 71 y.o.   MRN: [id number]  HPI HPI Comments: Susan Miles is a 71 y.o. female who presents to [facility] for a follow up regarding diabetes.  Patient has not had any recent episodes of Gout. Patient has received her flu shot today.    Diabetes: We gave her the option of changing medication.  Patient wanted to work on diet and exercise first.   ----She has been compliant with taking her Metformin, Glipizide and Januvia.  Is exercising about two times per week and does not eat fast food often.  Mainly cooks at home. Her blood sugar readings at home have been ranging from 120- 150. Denies any symptomatic lows.  Lab Results  Component Value Date   HGBA1C 8.1 (H) 04/27/2017   Lab Results  Component Value Date   MICROALBUR 7.5 03/23/2016  Optho: Had an eye exam on May 10 th  Foot exam: May 24 th, foot exam completed.  Wt Readings from Last 3 Encounters:  08/03/17 193 lb (87.5 kg)  08/03/17 193 lb 8 oz (87.8 kg)  04/27/17 192 lb 3.2 oz (87.2 kg)    Hypertension: BP elevated at last visit.  Increased Amlodipine to 10 mg, continued same dose of Clonidine, .2  mg BID.  She is also taking Diovan 300 mg and Toprol 200 mg.  Due to medication recall, will need change from Diovan today. ----She is compliant with her medication. Patient has been having some swelling in her ankles and feet bilaterally, but states that she is doing "just fine" today.  The swelling has improved recently and states that it was mainly an issue when she was running around during her 50 th wedding anniversary.    Hyperlipidemia: on Lipitor 20 mg QD.--- Patient is compliant with this medication.     Lab Results  Component Value Date   CHOL 122 04/27/2017   HDL 37 (L) 04/27/2017   LDLCALC 68 04/27/2017   TRIG 87 04/27/2017   CHOLHDL 3.3 04/27/2017    Patient Active Problem List   Diagnosis Date Noted  . Breast asymmetry 05/01/2015  . Cataract, nuclear 05/24/2012  . HTN (hypertension) 02/10/2012  . DM2 (diabetes mellitus, type 2) (HCC) 02/10/2012   Past Medical History:  Diagnosis Date  . Diabetes mellitus without complication (HCC)   . Hypertension    History reviewed. No pertinent surgical history. Allergies  Allergen Reactions  . Ace Inhibitors Cough   Prior to Admission medications   Medication Sig Start Date End Date Taking? Authorizing Provider  allopurinol (ZYLOPRIM) 100 MG tablet TAKE 2 TABLETS (200 MG TOTAL) BY MOUTH DAILY. 06/05/17   Susan Miles, Susan R, MD  amLODipine (NORVASC) 10 MG tablet Take 1 tablet (10 mg total) by mouth daily. 04/27/17   Susan Miles, Susan R, MD  aspirin 81 MG tablet Take 81 mg by mouth daily.    [provider]  atorvastatin (LIPITOR) 20 MG tablet Take 1 tablet (20 mg total) by mouth daily at 6 PM. 01/12/17   Susan Miles, Susan R, MD  Blood Glucose Monitoring Suppl (LDR BLOOD GLUCOSE TRUETEST) w/Device KIT 1 Device by   Does not apply route daily. Test blood sugar once daily. Dx: E11.9 09/23/16   [name], MD  cetirizine (ZYRTEC) 10 MG tablet Take 1 tablet (10 mg total) by mouth daily. 09/21/15   [name], MD  cloNIDine (CATAPRES) 0.2 MG tablet TAKE 1 TABLET (0.2 MG TOTAL) BY MOUTH 2 TIMES DAILY 01/12/17   [name], MD  colchicine 0.6 MG tablet 2 tablets once, then 1 tablet in 1 hour if needed for pain. No further doses per attack. 01/12/17   [name], MD  fluticasone (FLONASE) 50 MCG/ACT nasal spray Place 1-2 sprays into both nostrils daily. 06/27/17   [name], MD  glipiZIDE (GLUCOTROL) 10 MG tablet TAKE 2 TABLETS (20 MG TOTAL) BY MOUTH 2 (TWO) TIMES DAILY BEFORE A MEAL. 01/12/17   [name], MD   glucose blood (TRUETEST TEST) test strip Test blood sugar once daily. Dx: E11.9 09/23/16   [name], MD  HYDROcodone-acetaminophen (NORCO) 5-325 MG tablet Take 1 tablet by mouth every 6 (six) hours as needed for moderate pain. 01/12/17   [name], MD  Lancets MISC Test blood sugar once daily. Dx: E11.9 09/23/16   [name], MD  metFORMIN (GLUCOPHAGE) 1000 MG tablet Take 1 tablet (1,000 mg total) by mouth 2 (two) times daily with a meal. 01/12/17   [name], MD  metoprolol (TOPROL-XL) 200 MG 24 hr tablet TAKE 1 TABLET (200 MG TOTAL) BY MOUTH DAILY. 01/12/17   [name], MD  sitaGLIPtin (JANUVIA) 100 MG tablet Take 1 tablet (100 mg total) by mouth daily. 01/12/17   [name], MD  valsartan (DIOVAN) 320 MG tablet Take 1 tablet (320 mg total) by mouth daily. 01/12/17   [name], MD   Social History   Social History  . Marital status: Married    Spouse name: N/A  . Number of children: N/A  . Years of education: N/A   Occupational History  . Not on file.   Social History Main Topics  . Smoking status: Never Smoker  . Smokeless tobacco: Never Used  . Alcohol use No  . Drug use: No  . Sexual activity: Not on file   Other Topics Concern  . Not on file   Social History Narrative  . No narrative on file      Review of Systems  Constitutional: Negative for fatigue and unexpected weight change.  Respiratory: Negative for chest tightness and shortness of breath.   Cardiovascular: Negative for chest pain, palpitations and leg swelling.  Gastrointestinal: Negative for abdominal pain and blood in stool.  Neurological: Negative for dizziness, syncope, light-headedness and headaches.       Objective:   Physical Exam  Constitutional: She is oriented to person, place, and time. She appears well-developed and well-nourished.  HENT:  Head: Normocephalic and atraumatic.  Eyes: Pupils are equal, round, and reactive to light. Conjunctivae and  EOM are normal.  Neck: Carotid bruit is not present.  Cardiovascular: Normal rate, regular rhythm, normal heart sounds and intact distal pulses.   no pedal edema.   Pulmonary/Chest: Effort normal and breath sounds normal.  Abdominal: Soft. She exhibits no pulsatile midline mass. There is no tenderness.  Neurological: She is alert and oriented to person, place, and time.  Skin: Skin is warm and dry.  Psychiatric: She has a normal mood and affect. Her behavior is normal.  Vitals reviewed.  Vitals:   08/03/17 0840 08/03/17 0928  BP: Marland Kitchen)  149/82 138/90  Pulse: 69   Resp: 16   Temp: 98 F (36.7 C)   TempSrc: Oral   SpO2: 98%   Weight: 193 lb (87.5 kg)   Height: 5' 4" (1.626 m)          Assessment & Plan:   Charika A Wagoner is a 70 y.o. female Type 2 diabetes mellitus with hyperglycemia, without long-term current use of insulin (HCC) - Plan: Hemoglobin A1cType 2 diabetes mellitus without complication, without long-term current use of insulin (HCC) - Plan: glipiZIDE (GLUCOTROL) 10 MG tablet, metFORMIN (GLUCOPHAGE) 1000 MG tablet, sitaGLIPtin (JANUVIA) 100 MG tablet  -Still hyperglycemia, uncontrolled based on last A1c. Minimal change in weight. Discussed need for improved control, would consider basal insulin, or possible GLP-1. We'll decide once we see A1c. Continue same dose of Januvia, glipizide, metformin for now. Januvia may be cost prohibitive, so would possibly change regardless.  Essential hypertension - Plan: losartan (COZAAR) 100 MG tablet, amLODipine (NORVASC) 10 MG tablet, cloNIDine (CATAPRES) 0.2 MG tablet Peripheral edema  - Borderline, will change to losartan due to valsartan recall. Motor home readings on new medication. Peripheral edema has improved, but if persistent or worsening, may be due to amlodipine. No changes for now  Mixed hyperlipidemia - Plan: atorvastatin (LIPITOR) 20 MG tablet Hyperlipidemia, unspecified hyperlipidemia type  - Tolerating statin, no changes  for now. Previous levels looked okay.  Gout of left foot, unspecified cause, unspecified chronicity - Plan: allopurinol (ZYLOPRIM) 100 MG tablet  - Continue allopurinol, consider testing next visit.    Meds ordered this encounter  Medications  . losartan (COZAAR) 100 MG tablet    Sig: Take 1 tablet (100 mg total) by mouth daily.    Dispense:  90 tablet    Refill:  1  . allopurinol (ZYLOPRIM) 100 MG tablet    Sig: Take 2 tablets (200 mg total) by mouth daily.    Dispense:  180 tablet    Refill:  0  . amLODipine (NORVASC) 10 MG tablet    Sig: Take 1 tablet (10 mg total) by mouth daily.    Dispense:  90 tablet    Refill:  1  . atorvastatin (LIPITOR) 20 MG tablet    Sig: Take 1 tablet (20 mg total) by mouth daily at 6 PM.    Dispense:  90 tablet    Refill:  1  . cloNIDine (CATAPRES) 0.2 MG tablet    Sig: TAKE 1 TABLET (0.2 MG TOTAL) BY MOUTH 2 TIMES DAILY    Dispense:  180 tablet    Refill:  1  . glipiZIDE (GLUCOTROL) 10 MG tablet    Sig: TAKE 2 TABLETS (20 MG TOTAL) BY MOUTH 2 (TWO) TIMES DAILY BEFORE A MEAL.    Dispense:  360 tablet    Refill:  1  . metFORMIN (GLUCOPHAGE) 1000 MG tablet    Sig: Take 1 tablet (1,000 mg total) by mouth 2 (two) times daily with a meal.    Dispense:  180 tablet    Refill:  1  . sitaGLIPtin (JANUVIA) 100 MG tablet    Sig: Take 1 tablet (100 mg total) by mouth daily.    Dispense:  90 tablet    Refill:  1    PT REQUESTING 90 DAY SUPPLY   Patient Instructions    If A1c is still elevated, I would recommend either a basal insulin once per day, or a possible medicine that you inject once per week. That class of medication can lower your   blood sugar as well as some weight loss. We can discuss options once we see your A1c.  Exercise 4-5 days per week, monitor your blood pressure on the new medication. If you are consistently running over 140/90, return to discuss changes.  Otherwise follow-up with me in 3 months.    IF you received an x-ray today,  you will receive an invoice from [facility]. Please contact [facility] at [phone] with questions or concerns regarding your invoice.   IF you received labwork today, you will receive an invoice from [facility]. Please contact [facility] at [phone] with questions or concerns regarding your invoice.   Our billing staff will not be able to assist you with questions regarding bills from these companies.  You will be contacted with the lab results as soon as they are available. The fastest way to get your results is to activate your My Chart account. Instructions are located on the last page of this paperwork. If you have not heard from [place] regarding the results in 2 weeks, please contact this office.       I personally performed the services described in this documentation, which was scribed in my presence. The recorded information has been reviewed and considered for accuracy and completeness, addended by me as needed, and agree with information above.  Signed,   [name], MD [facility].  08/03/17 10:02 AM

## 2017-08-03 NOTE — Patient Instructions (Addendum)
  If A1c is still elevated, I would recommend either a basal insulin once per day, or a possible medicine that you inject once per week. That class of medication can lower your blood sugar as well as some weight loss. We can discuss options once we see your A1c.  Exercise 4-5 days per week, monitor your blood pressure on the new medication. If you are consistently running over 140/90, return to discuss changes.  Otherwise follow-up with me in 3 months.    IF you received an x-ray today, you will receive an invoice from [facility]. Please contact [facility] at [phone] with questions or concerns regarding your invoice.   IF you received labwork today, you will receive an invoice from [facility]. Please contact [facility] at [phone] with questions or concerns regarding your invoice.   Our billing staff will not be able to assist you with questions regarding bills from these companies.  You will be contacted with the lab results as soon as they are available. The fastest way to get your results is to activate your My Chart account. Instructions are located on the last page of this paperwork. If you have not heard from [place] regarding the results in 2 weeks, please contact this office.

## 2017-08-14 ENCOUNTER — Encounter: Payer: Self-pay | Admitting: *Deleted

## 2017-10-12 NOTE — Telephone Encounter (Signed)
done

## 2017-10-22 ENCOUNTER — Other Ambulatory Visit: Payer: Self-pay | Admitting: Family Medicine

## 2017-11-20 ENCOUNTER — Ambulatory Visit: Payer: Self-pay | Admitting: *Deleted

## 2017-11-20 ENCOUNTER — Other Ambulatory Visit: Payer: Self-pay

## 2017-11-20 ENCOUNTER — Encounter (HOSPITAL_COMMUNITY): Payer: Self-pay

## 2017-11-20 ENCOUNTER — Emergency Department (HOSPITAL_COMMUNITY)
Admission: EM | Admit: 2017-11-20 | Discharge: 2017-11-21 | Disposition: A | Discharge status: Home / Self Care | Payer: Commercial Managed Care - HMO | Attending: Emergency Medicine | Admitting: Emergency Medicine

## 2017-11-20 DIAGNOSIS — R112 Nausea with vomiting, unspecified: Secondary | ICD-10-CM | POA: Diagnosis not present

## 2017-11-20 DIAGNOSIS — E119 Type 2 diabetes mellitus without complications: Secondary | ICD-10-CM | POA: Diagnosis not present

## 2017-11-20 DIAGNOSIS — Z7982 Long term (current) use of aspirin: Secondary | ICD-10-CM | POA: Insufficient documentation

## 2017-11-20 DIAGNOSIS — Z79899 Other long term (current) drug therapy: Secondary | ICD-10-CM | POA: Insufficient documentation

## 2017-11-20 DIAGNOSIS — Z7984 Long term (current) use of oral hypoglycemic drugs: Secondary | ICD-10-CM | POA: Diagnosis not present

## 2017-11-20 DIAGNOSIS — R509 Fever, unspecified: Secondary | ICD-10-CM | POA: Insufficient documentation

## 2017-11-20 DIAGNOSIS — I10 Essential (primary) hypertension: Secondary | ICD-10-CM | POA: Diagnosis not present

## 2017-11-20 DIAGNOSIS — R1110 Vomiting, unspecified: Secondary | ICD-10-CM

## 2017-11-20 LAB — COMPREHENSIVE METABOLIC PANEL
ALBUMIN: 3.9 g/dL (ref 3.5–5.0)
ALT: 17 U/L (ref 14–54)
ANION GAP: 13 (ref 5–15)
AST: 22 U/L (ref 15–41)
Alkaline Phosphatase: 87 U/L (ref 38–126)
BILIRUBIN TOTAL: 1.3 mg/dL — AB (ref 0.3–1.2)
BUN: 24 mg/dL — ABNORMAL HIGH (ref 6–20)
CO2: 28 mmol/L (ref 22–32)
Calcium: 11 mg/dL — ABNORMAL HIGH (ref 8.9–10.3)
Chloride: 99 mmol/L — ABNORMAL LOW (ref 101–111)
Creatinine, Ser: 1.36 mg/dL — ABNORMAL HIGH (ref 0.44–1.00)
GFR, EST AFRICAN AMERICAN: 45 mL/min — AB (ref >60)
GFR, EST NON AFRICAN AMERICAN: 38 mL/min — AB (ref >60)
GLUCOSE: 232 mg/dL — AB (ref 65–99)
POTASSIUM: 3.5 mmol/L (ref 3.5–5.1)
Sodium: 140 mmol/L (ref 135–145)
TOTAL PROTEIN: 8 g/dL (ref 6.5–8.1)

## 2017-11-20 LAB — CBC
HEMATOCRIT: 45.3 % (ref 36.0–46.0)
HEMOGLOBIN: 15.4 g/dL — AB (ref 12.0–15.0)
MCH: 28.5 pg (ref 26.0–34.0)
MCHC: 34 g/dL (ref 30.0–36.0)
MCV: 83.7 fL (ref 78.0–100.0)
Platelets: 302 10*3/uL (ref 150–400)
RBC: 5.41 MIL/uL — AB (ref 3.87–5.11)
RDW: 13.6 % (ref 11.5–15.5)
WBC: 8.4 10*3/uL (ref 4.0–10.5)

## 2017-11-20 LAB — LIPASE, BLOOD: Lipase: 26 U/L (ref 11–51)

## 2017-11-20 MED ORDER — SODIUM CHLORIDE 0.9 % IV BOLUS (SEPSIS)
500.0000 mL | Freq: Once | INTRAVENOUS | Status: AC
  Administered 2017-11-21: 500 mL via INTRAVENOUS

## 2017-11-20 MED ORDER — ONDANSETRON 4 MG PO TBDP
4.0000 mg | ORAL_TABLET | Freq: Once | ORAL | Status: AC | PRN
  Administered 2017-11-20: 4 mg via ORAL
  Filled 2017-11-20: qty 1

## 2017-11-20 MED ORDER — SODIUM CHLORIDE 0.9 % IV BOLUS (SEPSIS)
1000.0000 mL | Freq: Once | INTRAVENOUS | Status: AC
  Administered 2017-11-20: 1000 mL via INTRAVENOUS

## 2017-11-20 NOTE — ED Notes (Signed)
Given sprite.

## 2017-11-20 NOTE — Telephone Encounter (Signed)
C/o vomiting since Friday.   Only able to keep down sips of Ginger Ale and soda crackers both of which she also vomits up on occasion.   She is NIDDM.  Her glucose this morning was 154. I spoke with the Flow Coordinator for Dr. Carlota Raspberry to see if they wanted to see her or have her go on to the ED.    Dr. Carlota Raspberry requested she go to the ED.   Due to her hydration status IV fluids are probably needed. She agreed to go to the ED.   Her husband is going to drive her to [facility] ED.   Reason for Disposition . [1] MODERATE vomiting (e.g., 3 - 5 times/day) AND [2] age > 3  Answer Assessment - Initial Assessment Questions 1. VOMITING SEVERITY: "How many times have you vomited in the past 24 hours?"  I vomit every time I try to drink something or eat something.   This has been going on since Friday.    - MILD:  1 - 2 times/day    - MODERATE: 3 - 5 times/day, decreased oral intake without significant weight loss or symptoms of dehydration    - SEVERE: 6 or more times/day, vomits everything or nearly everything, with significant weight loss, symptoms of dehydration      Sipping on Ginger Ale and eating soda crackers.  She is diabetic.   Glucose is 154 this morning. 2. ONSET: "When did the vomiting begin?"      Friday. 3. FLUIDS: "What fluids or food have you vomited up today?" "Have you been able to keep any fluids down?"     Sometimes the Ginger Ale and crackers stay down other times they don't. 4. ABDOMINAL PAIN: "Are your having any abdominal pain?" If yes : "How bad is it and what does it feel like?" (e.g., crampy, dull, intermittent, constant)      No abd pain.   5. DIARRHEA: "Is there any diarrhea?" If so, ask: "How many times today?"      No diarrhea.   I've been urinating more frequently but it's not burning.   I could feel I had a fever this morning when I woke up but I didn't check it. 6. CONTACTS: "Is there anyone else in the family with the same symptoms?"      No 7. CAUSE: "What do you  think is causing your vomiting?"     No idea.  No incontinence or burning with urination.   Denies back pain. 8. HYDRATION STATUS: "Any signs of dehydration?" (e.g., dry mouth [not only dry lips], too weak to stand) "When did you last urinate?"     I'm feeling very dry.   I urinated this morning. 9. OTHER SYMPTOMS: "Do you have any other symptoms?" (e.g., fever, headache, vertigo, vomiting blood or coffee grounds, recent head injury)     Felt like I had a fever this morning.  Just sick on my stomach.   No head injuries. 10. PREGNANCY: "Is there any chance you are pregnant?" "When was your last menstrual period?"       Not asked  Protocols used: [facility]

## 2017-11-20 NOTE — ED Triage Notes (Signed)
Pt states she has had emesis that began on Friday, she states approximately 4-5 episodes. Pt denies abd pain, chest pain, diarrhea. Skin warm and dry.

## 2017-11-20 NOTE — ED Provider Notes (Signed)
[facility] EMERGENCY DEPARTMENT Provider Note   CSN: [id number] Arrival date & time: 11/20/17  1504     History   Chief Complaint Chief Complaint  Patient presents with  . Emesis    HPI DEVORY MCKINZIE is a 71 y.o. female.  HPI 71 year old female history of type 2 diabetes, hypertension, who has had nausea and vomiting beginning on Friday.  She has vomited a total of 5 times.  She describes it as nonbilious and nonbloody.  Today she had some subjective fever.  She has not had any diarrhea or abdominal pain.  She has not had any URI symptoms.  She denies any urinary tract infection symptoms. Past Medical History:  Diagnosis Date  . Diabetes mellitus without complication ([place])   . Hypertension     Patient Active Problem List   Diagnosis Date Noted  . Breast asymmetry 05/01/2015  . Cataract, nuclear 05/24/2012  . HTN (hypertension) 02/10/2012  . DM2 (diabetes mellitus, type 2) ([place]) 02/10/2012    History reviewed. No pertinent surgical history.  OB History    No data available       Home Medications    Prior to Admission medications   Medication Sig Start Date End Date Taking? Authorizing Provider  acetaminophen (TYLENOL) 500 MG tablet Take 500 mg by mouth every 6 (six) hours as needed for mild pain.   Yes [provider]  allopurinol (ZYLOPRIM) 100 MG tablet Take 2 tablets (200 mg total) by mouth daily. 08/03/17  Yes [name], MD  amLODipine (NORVASC) 10 MG tablet Take 1 tablet (10 mg total) by mouth daily. 08/03/17  Yes [name], MD  aspirin 81 MG tablet Take 81 mg by mouth daily.   Yes [provider]  atorvastatin (LIPITOR) 20 MG tablet Take 1 tablet (20 mg total) by mouth daily at 6 PM. 08/03/17  Yes [name], MD  cetirizine (ZYRTEC) 10 MG tablet Take 1 tablet (10 mg total) by mouth daily. 09/21/15  Yes [name], MD  cloNIDine (CATAPRES) 0.2 MG tablet TAKE 1 TABLET (0.2 MG TOTAL) BY MOUTH 2  TIMES DAILY 08/03/17  Yes [name], MD  colchicine 0.6 MG tablet 2 tablets once, then 1 tablet in 1 hour if needed for pain. No further doses per attack. Patient taking differently: Take 0.6 mg by mouth daily as needed (gout attack). 2 tablets once, then 1 tablet in 1 hour if needed for pain. No further doses per attack. 01/12/17  Yes [name], MD  fluticasone [facility]) 50 MCG/ACT nasal spray Place 1-2 sprays into both nostrils daily. 06/27/17  Yes [name], MD  glipiZIDE (GLUCOTROL) 10 MG tablet TAKE 2 TABLETS (20 MG TOTAL) BY MOUTH 2 (TWO) TIMES DAILY BEFORE A MEAL. 08/03/17  Yes [name], MD  HYDROcodone-acetaminophen (NORCO) 5-325 MG tablet Take 1 tablet by mouth every 6 (six) hours as needed for moderate pain. 01/12/17  Yes [name], MD  losartan (COZAAR) 100 MG tablet Take 1 tablet (100 mg total) by mouth daily. 08/03/17  Yes [name], MD  metFORMIN (GLUCOPHAGE) 1000 MG tablet Take 1 tablet (1,000 mg total) by mouth 2 (two) times daily with a meal. 08/03/17  Yes [name], MD  metoprolol (TOPROL-XL) 200 MG 24 hr tablet TAKE 1 TABLET (200 MG TOTAL) BY MOUTH DAILY. 01/12/17  Yes [name], MD  sitaGLIPtin (JANUVIA) 100 MG tablet Take 1 tablet (100 mg total) by mouth  daily. 08/03/17  Yes [name], MD  ACCU-CHEK AVIVA PLUS test strip USE TO TEST BLOOD SUGAR ONCE DAILY. DX E11.9 10/23/17   [name], MD  Blood Glucose Monitoring Suppl (LDR BLOOD GLUCOSE TRUETEST) w/Device KIT 1 Device by Does not apply route daily. Test blood sugar once daily. Dx: E11.9 09/23/16   [name], MD  Lancets MISC Test blood sugar once daily. Dx: E11.9 09/23/16   [name], MD    Family History Family History  Problem Relation Age of Onset  . Hypertension Mother   . Heart disease Father     Social History Social History   Tobacco Use  . Smoking status: Never Smoker  . Smokeless tobacco: Never Used  Substance Use Topics    . Alcohol use: No  . Drug use: No     Allergies   Ace inhibitors   Review of Systems Review of Systems  All other systems reviewed and are negative.    Physical Exam Updated Vital Signs BP (!) 181/102 (BP Location: Right Arm)   Pulse 89   Temp 98.7 F (37.1 C) (Oral)   Resp 16   Ht 1.626 m ('5\' 4"'$ )   Wt 87.5 kg (193 lb)   SpO2 100%   BMI 33.13 kg/m   Physical Exam  Constitutional: She is oriented to person, place, and time. She appears well-developed and well-nourished.  HENT:  Head: Normocephalic and atraumatic.  Right Ear: External ear normal.  Left Ear: External ear normal.  Mouth/Throat: Oropharynx is clear and moist.  Eyes: EOM are normal. Pupils are equal, round, and reactive to light.  Neck: Normal range of motion. Neck supple.  Cardiovascular: Normal rate, regular rhythm, normal heart sounds and intact distal pulses.  Pulmonary/Chest: Effort normal and breath sounds normal.  Abdominal: Soft. Bowel sounds are normal.  Musculoskeletal: Normal range of motion.  Neurological: She is alert and oriented to person, place, and time.  Skin: Skin is warm and dry. Capillary refill takes less than 2 seconds.  Psychiatric: She has a normal mood and affect.  Nursing note and vitals reviewed.    ED Treatments / Results  Labs (all labs ordered are listed, but only abnormal results are displayed) Labs Reviewed  COMPREHENSIVE METABOLIC PANEL - Abnormal; Notable for the following components:      Result Value   Chloride 99 (*)    Glucose, Bld 232 (*)    BUN 24 (*)    Creatinine, Ser 1.36 (*)    Calcium 11.0 (*)    Total Bilirubin 1.3 (*)    GFR calc non Af Amer 38 (*)    GFR calc Af Amer 45 (*)    All other components within normal limits  CBC - Abnormal; Notable for the following components:   RBC 5.41 (*)    Hemoglobin 15.4 (*)    All other components within normal limits  LIPASE, BLOOD  URINALYSIS, ROUTINE W REFLEX MICROSCOPIC    EKG  EKG  Interpretation None       Radiology No results found.  Procedures Procedures (including critical care time)  Medications Ordered in ED Medications  sodium chloride 0.9 % bolus 1,000 mL (not administered)  ondansetron (ZOFRAN-ODT) disintegrating tablet 4 mg (4 mg Oral Given 11/20/17 1612)     Initial Impression / Assessment and Plan / ED Course  I have reviewed the triage vital signs and the nursing notes.  Pertinent labs & imaging results that were available during my care of  the patient were reviewed by me and considered in my medical decision making (see chart for details).     With nausea and vomiting and some subjective fever.  She has felt improved since receiving Zofran prior to evaluation.  She is given an IV fluid bolus and will be given a p.o. fluid challenge.  Final Clinical Impressions(s) / ED Diagnoses   Final diagnoses:  None    ED Discharge Orders    None       Pattricia Boss, MD 11/21/17 [phone]

## 2017-11-21 LAB — URINALYSIS, ROUTINE W REFLEX MICROSCOPIC
BILIRUBIN URINE: NEGATIVE
GLUCOSE, UA: 50 mg/dL — AB
KETONES UR: NEGATIVE mg/dL
Nitrite: NEGATIVE
PH: 6 (ref 5.0–8.0)
PROTEIN: 30 mg/dL — AB
Specific Gravity, Urine: 1.012 (ref 1.005–1.030)

## 2017-11-21 MED ORDER — ONDANSETRON 8 MG PO TBDP: 8.0000 mg | ORAL_TABLET | Freq: Three times a day (TID) | ORAL | 0 refills | Status: DC | PRN

## 2017-11-21 MED ORDER — CEPHALEXIN 500 MG PO CAPS: 500.0000 mg | ORAL_CAPSULE | Freq: Three times a day (TID) | ORAL | 0 refills | Status: DC

## 2017-11-21 NOTE — ED Provider Notes (Addendum)
3:45 AM Patient feels much better at this time.  Discharged home in good condition.  Tolerating sandwich and fluids at this time.  Patient does have urinary frequency over the past 2 weeks.  Urine culture sent.  Patient will be given 5 days of Keflex.   Jola Schmidt, MD 11/21/17 Thyra Breed    Jola Schmidt, MD 11/21/17 [phone]

## 2017-11-21 NOTE — ED Notes (Signed)
Pt departed in NAD, refused use of wheelchair.  

## 2017-11-22 LAB — URINE CULTURE

## 2017-12-03 ENCOUNTER — Other Ambulatory Visit: Payer: Self-pay | Admitting: Family Medicine

## 2017-12-03 DIAGNOSIS — M109 Gout, unspecified: Secondary | ICD-10-CM

## 2017-12-21 ENCOUNTER — Ambulatory Visit (INDEPENDENT_AMBULATORY_CARE_PROVIDER_SITE_OTHER): Payer: Medicare HMO | Admitting: Family Medicine

## 2017-12-21 ENCOUNTER — Other Ambulatory Visit: Payer: Self-pay

## 2017-12-21 ENCOUNTER — Encounter: Payer: Self-pay | Admitting: Family Medicine

## 2017-12-21 VITALS — BP 138/88 | HR 77 | Temp 98.3°F | Resp 16 | Ht 63.19 in | Wt 198.0 lb

## 2017-12-21 DIAGNOSIS — E785 Hyperlipidemia, unspecified: Secondary | ICD-10-CM | POA: Diagnosis not present

## 2017-12-21 DIAGNOSIS — I10 Essential (primary) hypertension: Secondary | ICD-10-CM

## 2017-12-21 DIAGNOSIS — E782 Mixed hyperlipidemia: Secondary | ICD-10-CM

## 2017-12-21 DIAGNOSIS — E119 Type 2 diabetes mellitus without complications: Secondary | ICD-10-CM

## 2017-12-21 DIAGNOSIS — M109 Gout, unspecified: Secondary | ICD-10-CM

## 2017-12-21 MED ORDER — LOSARTAN POTASSIUM 100 MG PO TABS: 100.0000 mg | ORAL_TABLET | Freq: Every day | ORAL | 1 refills | Status: DC

## 2017-12-21 MED ORDER — ATORVASTATIN CALCIUM 20 MG PO TABS: 20.0000 mg | ORAL_TABLET | Freq: Every day | ORAL | 1 refills | Status: DC

## 2017-12-21 MED ORDER — AMLODIPINE BESYLATE 10 MG PO TABS: 10.0000 mg | ORAL_TABLET | Freq: Every day | ORAL | 1 refills | Status: DC

## 2017-12-21 MED ORDER — ALLOPURINOL 100 MG PO TABS: 200.0000 mg | ORAL_TABLET | Freq: Every day | ORAL | 1 refills | Status: DC

## 2017-12-21 MED ORDER — METOPROLOL SUCCINATE ER 200 MG PO TB24: ORAL_TABLET | ORAL | 1 refills | Status: DC

## 2017-12-21 MED ORDER — CLONIDINE HCL 0.2 MG PO TABS: ORAL_TABLET | ORAL | 1 refills | Status: DC

## 2017-12-21 MED ORDER — GLIPIZIDE 10 MG PO TABS: ORAL_TABLET | ORAL | 1 refills | Status: DC

## 2017-12-21 MED ORDER — FLUTICASONE PROPIONATE 50 MCG/ACT NA SUSP: 1.0000 | Freq: Every day | NASAL | 6 refills | Status: DC

## 2017-12-21 MED ORDER — SITAGLIPTIN PHOSPHATE 100 MG PO TABS: 100.0000 mg | ORAL_TABLET | Freq: Every day | ORAL | 1 refills | Status: DC

## 2017-12-21 MED ORDER — METFORMIN HCL 1000 MG PO TABS: 1000.0000 mg | ORAL_TABLET | Freq: Two times a day (BID) | ORAL | 1 refills | Status: DC

## 2017-12-21 NOTE — Progress Notes (Signed)
Subjective:  By signing my name below, I, [name], attest that this documentation has been prepared under the direction and in the presence of [name], MD Electronically Signed: Ladene Artist, ED Scribe 12/21/2017 at 5:10 PM.   Patient ID: [name], female    DOB: 01-08-1946, 72 y.o.   MRN: [id number]  Chief Complaint  Patient presents with  . Hypertension    6 month follow-up   . Diabetes   HPI LATRIA MCCARRON is a 72 y.o. female who presents to Primary Care at [facility] for f/u. H/o DM, HTN, hyperlipidemia.  HTN Amlodipine 10 mg qd, clonidine 0.2 mg bid, losartan 100 mg qd, Toprol 200 mg qd. Lab Results  Component Value Date   CREATININE 1.36 (H) 11/20/2017  That was at the time of vomiting when seen in the ER. Previous range 0.91-1.18 over past 2 yrs. She has been monitoring her BP at home with readings of 120s/80. Denies cp, sob, lightheadedness, dizziness, HA, blood in stools.  DM Lab Results  Component Value Date   HGBA1C 7.3 (H) 08/03/2017   Lab Results  Component Value Date   MICROALBUR 7.5 03/23/2016  Improving A1C in Aug from 8.1 to 7.3. Continued same medications. Glipizide 10 mg 2 pills bid, Metformin 100 mg bid, Januvia 100 mg qd. On ARB and statin. Pt has been checking her blood glucose at home with readings of 120-160. She reports a reading of 157 today, 83 PTA since she has not eaten. No hypoglycemia lows. Has seen her dentist in 2018 and eye doctor in May 2018.  Hyperlipidemia Lab Results  Component Value Date   CHOL 122 04/27/2017   HDL 37 (L) 04/27/2017   LDLCALC 68 04/27/2017   TRIG 87 04/27/2017   CHOLHDL 3.3 04/27/2017   Lab Results  Component Value Date   ALT 17 11/20/2017   AST 22 11/20/2017   ALKPHOS 87 11/20/2017   BILITOT 1.3 (H) 11/20/2017  Lipitor 20 mg qd. Pt states that she has been exercising sometimes 4-5 days/wk, sometimes 3 days/wk.  Gout H/o gout of L foot. Treated with allopurinol for prevention at 200 mg qd.  Last uric acid 01/2017 was 3.9. Denies recent gout flares.  Environmental Allergies  Flonase and Zyrtec.  Patient Active Problem List   Diagnosis Date Noted  . Hyperlipidemia 12/21/2017  . Breast asymmetry 05/01/2015  . Cataract, nuclear 05/24/2012  . HTN (hypertension) 02/10/2012  . DM2 (diabetes mellitus, type 2) ([place]) 02/10/2012   Past Medical History:  Diagnosis Date  . Diabetes mellitus without complication ([place])   . Hypertension    History reviewed. No pertinent surgical history. Allergies  Allergen Reactions  . Ace Inhibitors Cough   Prior to Admission medications   Medication Sig Start Date End Date Taking? Authorizing Provider  ACCU-CHEK AVIVA PLUS test strip USE TO TEST BLOOD SUGAR ONCE DAILY. DX E11.9 10/23/17   [name], MD  acetaminophen (TYLENOL) 500 MG tablet Take 500 mg by mouth every 6 (six) hours as needed for mild pain.    [provider]  allopurinol (ZYLOPRIM) 100 MG tablet TAKE 2 TABLETS BY MOUTH EVERY DAY 12/04/17   [name], MD  amLODipine (NORVASC) 10 MG tablet Take 1 tablet (10 mg total) by mouth daily. 08/03/17   [name], MD  aspirin 81 MG tablet Take 81 mg by mouth daily.    [provider]  atorvastatin (LIPITOR) 20 MG tablet Take 1 tablet (20 mg total) by  mouth daily at 6 PM. 08/03/17   [name], MD  Blood Glucose Monitoring Suppl (LDR BLOOD GLUCOSE TRUETEST) w/Device KIT 1 Device by Does not apply route daily. Test blood sugar once daily. Dx: E11.9 09/23/16   [name], MD  cephALEXin (KEFLEX) 500 MG capsule Take 1 capsule (500 mg total) by mouth 3 (three) times daily. 11/21/17   Jola Schmidt, MD  cetirizine (ZYRTEC) 10 MG tablet Take 1 tablet (10 mg total) by mouth daily. 09/21/15   [name], MD  cloNIDine (CATAPRES) 0.2 MG tablet TAKE 1 TABLET (0.2 MG TOTAL) BY MOUTH 2 TIMES DAILY 08/03/17   [name], MD  colchicine 0.6 MG tablet 2 tablets once, then 1 tablet in 1 hour if  needed for pain. No further doses per attack. Patient taking differently: Take 0.6 mg by mouth daily as needed (gout attack). 2 tablets once, then 1 tablet in 1 hour if needed for pain. No further doses per attack. 01/12/17   [name], MD  fluticasone (FLONASE) 50 MCG/ACT nasal spray Place 1-2 sprays into both nostrils daily. 06/27/17   [name], MD  glipiZIDE (GLUCOTROL) 10 MG tablet TAKE 2 TABLETS (20 MG TOTAL) BY MOUTH 2 (TWO) TIMES DAILY BEFORE A MEAL. 08/03/17   [name], MD  HYDROcodone-acetaminophen (NORCO) 5-325 MG tablet Take 1 tablet by mouth every 6 (six) hours as needed for moderate pain. 01/12/17   [name], MD  Lancets MISC Test blood sugar once daily. Dx: E11.9 09/23/16   [name], MD  losartan (COZAAR) 100 MG tablet Take 1 tablet (100 mg total) by mouth daily. 08/03/17   [name], MD  metFORMIN (GLUCOPHAGE) 1000 MG tablet Take 1 tablet (1,000 mg total) by mouth 2 (two) times daily with a meal. 08/03/17   [name], MD  metoprolol (TOPROL-XL) 200 MG 24 hr tablet TAKE 1 TABLET (200 MG TOTAL) BY MOUTH DAILY. 01/12/17   [name], MD  ondansetron (ZOFRAN ODT) 8 MG disintegrating tablet Take 1 tablet (8 mg total) by mouth every 8 (eight) hours as needed for nausea or vomiting. 11/21/17   Jola Schmidt, MD  sitaGLIPtin (JANUVIA) 100 MG tablet Take 1 tablet (100 mg total) by mouth daily. 08/03/17   [name], MD   Social History   Socioeconomic History  . Marital status: Married    Spouse name: Not on file  . Number of children: Not on file  . Years of education: Not on file  . Highest education level: Not on file  Social Needs  . Financial resource strain: Not on file  . Food insecurity - worry: Not on file  . Food insecurity - inability: Not on file  . Transportation needs - medical: Not on file  . Transportation needs - non-medical: Not on file  Occupational History  . Not on file  Tobacco Use  . Smoking  status: Never Smoker  . Smokeless tobacco: Never Used  Substance and Sexual Activity  . Alcohol use: No  . Drug use: No  . Sexual activity: Not on file  Other Topics Concern  . Not on file  Social History Narrative  . Not on file   Review of Systems  Constitutional: Negative for fatigue and unexpected weight change.  Respiratory: Negative for chest tightness and shortness of breath.   Cardiovascular: Negative for chest pain, palpitations and leg swelling.  Gastrointestinal: Negative for abdominal pain and blood in stool.  Neurological: Negative for dizziness, syncope, light-headedness and headaches.      Objective:   Physical Exam  Constitutional: She is oriented to person, place, and time. She appears well-developed and well-nourished.  HENT:  Head: Normocephalic and atraumatic.  Eyes: Conjunctivae and EOM are normal. Pupils are equal, round, and reactive to light.  Neck: Carotid bruit is not present.  Cardiovascular: Normal rate, regular rhythm, normal heart sounds and intact distal pulses.  Pulmonary/Chest: Effort normal and breath sounds normal.  Abdominal: Soft. She exhibits no pulsatile midline mass. There is no tenderness.  Neurological: She is alert and oriented to person, place, and time.  Skin: Skin is warm and dry.  Psychiatric: She has a normal mood and affect. Her behavior is normal.  Vitals reviewed.  Vitals:   12/21/17 1653 12/21/17 1741  BP: (!) 142/80 138/88  Pulse: 77   Resp: 16   Temp: 98.3 F (36.8 C)   TempSrc: Oral   SpO2: 98%   Weight: 198 lb (89.8 kg)   Height: 5' 3.19" (1.605 m)       Assessment & Plan:   ARIONNE IAMS is a 72 y.o. female Type 2 diabetes mellitus without complication, without long-term current use of insulin ([facility]) - Plan: Microalbumin, urine, Hemoglobin A1c, sitaGLIPtin (JANUVIA) 100 MG tablet, metFORMIN (GLUCOPHAGE) 1000 MG tablet, glipiZIDE (GLUCOTROL) 10 MG tablet  - Check A1c, continue same doses of meds for now. If  persistent elevated A1c, option may be to switch Januvia to GLP-1, add SGOT 2, or possible insulin. Continue to watch diet, exercise.  Hyperlipidemia, unspecified hyperlipidemia type Mixed hyperlipidemia - Plan: Lipid panel, Comprehensive metabolic panel, atorvastatin (LIPITOR) 20 MG tablet  -Continue Lipitor as currently tolerated. Check labs  Essential hypertension - Plan: Comprehensive metabolic panel, metoprolol (TOPROL-XL) 200 MG 24 hr tablet, losartan (COZAAR) 100 MG tablet, cloNIDine (CATAPRES) 0.2 MG tablet, amLODipine (NORVASC) 10 MG tablet  -Tolerating current regimen, repeat BP closer to goal with diabetes. Continue to monitor at home  Gout of left foot, unspecified cause, unspecified chronicity - Plan: Uric Acid, allopurinol (ZYLOPRIM) 100 MG tablet  -No recent gout flares, continue allopurinol, check uric acid.  Meds ordered this encounter  Medications  . sitaGLIPtin (JANUVIA) 100 MG tablet    Sig: Take 1 tablet (100 mg total) by mouth daily.    Dispense:  90 tablet    Refill:  1    PT REQUESTING 90 DAY SUPPLY  . metoprolol (TOPROL-XL) 200 MG 24 hr tablet    Sig: TAKE 1 TABLET (200 MG TOTAL) BY MOUTH DAILY.    Dispense:  90 tablet    Refill:  1  . metFORMIN (GLUCOPHAGE) 1000 MG tablet    Sig: Take 1 tablet (1,000 mg total) by mouth 2 (two) times daily with a meal.    Dispense:  180 tablet    Refill:  1  . losartan (COZAAR) 100 MG tablet    Sig: Take 1 tablet (100 mg total) by mouth daily.    Dispense:  90 tablet    Refill:  1  . glipiZIDE (GLUCOTROL) 10 MG tablet    Sig: TAKE 2 TABLETS (20 MG TOTAL) BY MOUTH 2 (TWO) TIMES DAILY BEFORE A MEAL.    Dispense:  360 tablet    Refill:  1  . fluticasone (FLONASE) 50 MCG/ACT nasal spray    Sig: Place 1-2 sprays into both nostrils daily.    Dispense:  16 g    Refill:  6  . cloNIDine (CATAPRES) 0.2  MG tablet    Sig: TAKE 1 TABLET (0.2 MG TOTAL) BY MOUTH 2 TIMES DAILY    Dispense:  180 tablet    Refill:  1  . atorvastatin  (LIPITOR) 20 MG tablet    Sig: Take 1 tablet (20 mg total) by mouth daily at 6 PM.    Dispense:  90 tablet    Refill:  1  . amLODipine (NORVASC) 10 MG tablet    Sig: Take 1 tablet (10 mg total) by mouth daily.    Dispense:  90 tablet    Refill:  1  . allopurinol (ZYLOPRIM) 100 MG tablet    Sig: Take 2 tablets (200 mg total) by mouth daily.    Dispense:  180 tablet    Refill:  1   Patient Instructions    Depending on A1c can decide on med changes (once weekly injection, change of pill or possible insulin depending on readings).   Blood pressure goal of 130/80 or below. No med changes today.   Thanks for coming in today.    IF you received an x-ray today, you will receive an invoice from [facility]. Please contact [facility] Radiology at [phone] with questions or concerns regarding your invoice.   IF you received labwork today, you will receive an invoice from [place] [facility]. Please contact [facility] at [phone] with questions or concerns regarding your invoice.   Our billing staff will not be able to assist you with questions regarding bills from these companies.  You will be contacted with the lab results as soon as they are available. The fastest way to get your results is to activate your My Chart account. Instructions are located on the last page of this paperwork. If you have not heard from [place] regarding the results in 2 weeks, please contact this office.       I personally performed the services described in this documentation, which was scribed in my presence. The recorded information has been reviewed and considered for accuracy and completeness, addended by me as needed, and agree with information above.  Signed,   [name], MD [facility] at [facility].  12/22/17 11:15 PM

## 2017-12-21 NOTE — Patient Instructions (Addendum)
  Depending on A1c can decide on med changes (once weekly injection, change of pill or possible insulin depending on readings).   Blood pressure goal of 130/80 or below. No med changes today.   Thanks for coming in today.    IF you received an x-ray today, you will receive an invoice from [facility]iology. Please contact [facility] Radiology at [phone] with questions or concerns regarding your invoice.   IF you received labwork today, you will receive an invoice from [place]. Please contact [facility] at [phone] with questions or concerns regarding your invoice.   Our billing staff will not be able to assist you with questions regarding bills from these companies.  You will be contacted with the lab results as soon as they are available. The fastest way to get your results is to activate your My Chart account. Instructions are located on the last page of this paperwork. If you have not heard from [place] regarding the results in 2 weeks, please contact this office.

## 2017-12-22 LAB — COMPREHENSIVE METABOLIC PANEL
ALBUMIN: 4.1 g/dL (ref 3.5–4.8)
ALT: 12 IU/L (ref 0–32)
AST: 16 IU/L (ref 0–40)
Albumin/Globulin Ratio: 1.2 (ref 1.2–2.2)
Alkaline Phosphatase: 79 IU/L (ref 39–117)
BUN / CREAT RATIO: 17 (ref 12–28)
BUN: 19 mg/dL (ref 8–27)
Bilirubin Total: 0.4 mg/dL (ref 0.0–1.2)
CALCIUM: 11 mg/dL — AB (ref 8.7–10.3)
CO2: 18 mmol/L — AB (ref 20–29)
CREATININE: 1.09 mg/dL — AB (ref 0.57–1.00)
Chloride: 104 mmol/L (ref 96–106)
GFR calc Af Amer: 59 mL/min/{1.73_m2} — ABNORMAL LOW (ref >59)
GFR, EST NON AFRICAN AMERICAN: 51 mL/min/{1.73_m2} — AB (ref >59)
GLOBULIN, TOTAL: 3.3 g/dL (ref 1.5–4.5)
Glucose: 84 mg/dL (ref 65–99)
Potassium: 3.9 mmol/L (ref 3.5–5.2)
SODIUM: 140 mmol/L (ref 134–144)
TOTAL PROTEIN: 7.4 g/dL (ref 6.0–8.5)

## 2017-12-22 LAB — HEMOGLOBIN A1C
ESTIMATED AVERAGE GLUCOSE: 171 mg/dL
Hgb A1c MFr Bld: 7.6 % — ABNORMAL HIGH (ref 4.8–5.6)

## 2017-12-22 LAB — LIPID PANEL
CHOLESTEROL TOTAL: 161 mg/dL (ref 100–199)
Chol/HDL Ratio: 3.4 ratio (ref 0.0–4.4)
HDL: 48 mg/dL (ref >39)
LDL Calculated: 91 mg/dL (ref 0–99)
Triglycerides: 108 mg/dL (ref 0–149)
VLDL Cholesterol Cal: 22 mg/dL (ref 5–40)

## 2017-12-22 LAB — MICROALBUMIN, URINE: MICROALBUM., U, RANDOM: 91.3 ug/mL

## 2017-12-22 LAB — URIC ACID: Uric Acid: 5.1 mg/dL (ref 2.5–7.1)

## 2017-12-26 ENCOUNTER — Ambulatory Visit: Payer: Medicare HMO | Admitting: Family Medicine

## 2018-01-23 ENCOUNTER — Other Ambulatory Visit: Payer: Self-pay

## 2018-01-23 NOTE — Telephone Encounter (Signed)
Provider, please sign prescription for meter and lancets if agreeable.

## 2018-01-23 NOTE — Telephone Encounter (Signed)
Copied from [place] [id number]. Topic: Quick Communication - Office Called Patient >> Jan 15, 2018  4:43 PM Delle Reining, RN wrote: Reason for CRM: Phone call to patient, unable to reach.  After reviewing chart, it looks like Dr. Carlota Raspberry wanted to re-evaluate patient's a1c in April and consider an injectable medication- injectable medication not currently on her medication list. However, this RN notes that she does have prescription for a meter, lancets, and strips for blood sugar testing. Does she need a refill?. If patient calls back, please relay this- does this answer her question?  >> Jan 23, 2018  2:33 PM Wynetta Emery, [place] C wrote: Pt called back in. Pt says that she does need a Rx for a meter because it went out.

## 2018-01-24 MED ORDER — LDR BLOOD GLUCOSE TRUETEST W/DEVICE KIT: 1.0000 | PACK | Freq: Every day | 0 refills | Status: DC

## 2018-01-24 MED ORDER — LANCETS MISC: 3 refills | Status: AC

## 2018-01-24 MED ORDER — GLUCOSE BLOOD VI STRP: ORAL_STRIP | 1 refills | Status: DC

## 2018-01-25 ENCOUNTER — Ambulatory Visit (INDEPENDENT_AMBULATORY_CARE_PROVIDER_SITE_OTHER): Payer: Medicare HMO | Admitting: Family Medicine

## 2018-01-25 ENCOUNTER — Encounter: Payer: Self-pay | Admitting: Family Medicine

## 2018-01-25 VITALS — BP 144/84 | HR 98 | Temp 98.1°F | Resp 16 | Ht 63.19 in | Wt 200.0 lb

## 2018-01-25 DIAGNOSIS — E11628 Type 2 diabetes mellitus with other skin complications: Secondary | ICD-10-CM | POA: Diagnosis not present

## 2018-01-25 DIAGNOSIS — L02216 Cutaneous abscess of umbilicus: Secondary | ICD-10-CM

## 2018-01-25 MED ORDER — DOXYCYCLINE HYCLATE 100 MG PO TABS: 100.0000 mg | ORAL_TABLET | Freq: Two times a day (BID) | ORAL | 0 refills | Status: DC

## 2018-01-25 NOTE — Progress Notes (Signed)
Subjective:  By signing my name below, I, Susan Miles, attest that this documentation has been prepared under the direction and in the presence of Susan Raspberry Ranell Patrick, MD.  Electronically Signed: [name], Medical Scribe 01/25/18 at 5:50 PM  Patient was seen in Room 10    Patient ID: Bevely Miles, female    DOB: Jan 06, 1946, 72 y.o.   MRN: [id number] Chief Complaint  Patient presents with  . Recurrent Skin Infections    patient c/o bleeding from her navel x 1 week. Patient states that she has a boil that bursts    HPI Susan Miles is a 72 y.o. female who presents to Primary Care at [facility] complaining of a moderate, gradually worsening area of pain and swelling to the naval onset 5 days ago. She states that there was purilent drainage from the area 3 days ago. She notes that it still has been draining mildly. Pt states pain is exacerbated with palpation and direct pressure. Per pt, she had a tubial ligation laproscopically through the naval in the past. She has also had similar symptoms in the past. Denies fever, chills, or any other complaints at this time.     Patient Active Problem List   Diagnosis Date Noted  . Hyperlipidemia 12/21/2017  . Breast asymmetry 05/01/2015  . Cataract, nuclear 05/24/2012  . HTN (hypertension) 02/10/2012  . DM2 (diabetes mellitus, type 2) ([place]) 02/10/2012   Past Medical History:  Diagnosis Date  . Diabetes mellitus without complication ([place])   . Hypertension    No past surgical history on file. Allergies  Allergen Reactions  . Ace Inhibitors Cough   Prior to Admission medications   Medication Sig Start Date End Date Taking? Authorizing Provider  acetaminophen (TYLENOL) 500 MG tablet Take 500 mg by mouth every 6 (six) hours as needed for mild pain.   Yes [provider]  allopurinol (ZYLOPRIM) 100 MG tablet Take 2 tablets (200 mg total) by mouth daily. 12/21/17  Yes [name], MD  amLODipine (NORVASC) 10 MG tablet Take 1  tablet (10 mg total) by mouth daily. 12/21/17  Yes [name], MD  aspirin 81 MG tablet Take 81 mg by mouth daily.   Yes [provider]  atorvastatin (LIPITOR) 20 MG tablet Take 1 tablet (20 mg total) by mouth daily at 6 PM. 12/21/17  Yes [name], MD  Blood Glucose Monitoring Suppl (LDR BLOOD GLUCOSE TRUETEST) w/Device KIT 1 Device by Does not apply route daily. Test blood sugar once daily. Dx: E11.9 01/24/18  Yes [name], MD  cetirizine (ZYRTEC) 10 MG tablet Take 1 tablet (10 mg total) by mouth daily. 09/21/15  Yes [name], MD  cloNIDine (CATAPRES) 0.2 MG tablet TAKE 1 TABLET (0.2 MG TOTAL) BY MOUTH 2 TIMES DAILY 12/21/17  Yes [name], MD  colchicine 0.6 MG tablet 2 tablets once, then 1 tablet in 1 hour if needed for pain. No further doses per attack. Patient taking differently: Take 0.6 mg by mouth daily as needed (gout attack). 2 tablets once, then 1 tablet in 1 hour if needed for pain. No further doses per attack. 01/12/17  Yes [name], MD  fluticasone [facility]) 50 MCG/ACT nasal spray Place 1-2 sprays into both nostrils daily. 12/21/17  Yes [name], MD  glipiZIDE (GLUCOTROL) 10 MG tablet TAKE 2 TABLETS (20 MG TOTAL) BY MOUTH 2 (TWO) TIMES DAILY BEFORE A MEAL. 12/21/17  Yes [name], MD  glucose blood (ACCU-CHEK AVIVA PLUS) test strip USE TO TEST BLOOD SUGAR ONCE DAILY. DX E11.9 01/24/18  Yes [name], MD  HYDROcodone-acetaminophen (NORCO) 5-325 MG tablet Take 1 tablet by mouth every 6 (six) hours as needed for moderate pain. 01/12/17  Yes [name], MD  Lancets MISC Test blood sugar once daily. Dx: E11.9 01/24/18  Yes [name], MD  losartan (COZAAR) 100 MG tablet Take 1 tablet (100 mg total) by mouth daily. 12/21/17  Yes [name], MD  metFORMIN (GLUCOPHAGE) 1000 MG tablet Take 1 tablet (1,000 mg total) by mouth 2 (two) times daily with a meal. 12/21/17  Yes [name], MD  metoprolol  (TOPROL-XL) 200 MG 24 hr tablet TAKE 1 TABLET (200 MG TOTAL) BY MOUTH DAILY. 12/21/17  Yes [name], MD  ondansetron (ZOFRAN ODT) 8 MG disintegrating tablet Take 1 tablet (8 mg total) by mouth every 8 (eight) hours as needed for nausea or vomiting. 11/21/17  Yes Jola Schmidt, MD  sitaGLIPtin (JANUVIA) 100 MG tablet Take 1 tablet (100 mg total) by mouth daily. 12/21/17  Yes [name], MD   Social History   Socioeconomic History  . Marital status: Married    Spouse name: Not on file  . Number of children: Not on file  . Years of education: Not on file  . Highest education level: Not on file  Social Needs  . Financial resource strain: Not on file  . Food insecurity - worry: Not on file  . Food insecurity - inability: Not on file  . Transportation needs - medical: Not on file  . Transportation needs - non-medical: Not on file  Occupational History  . Not on file  Tobacco Use  . Smoking status: Never Smoker  . Smokeless tobacco: Never Used  Substance and Sexual Activity  . Alcohol use: No  . Drug use: No  . Sexual activity: Not on file  Other Topics Concern  . Not on file  Social History Narrative  . Not on file      Review of Systems  Constitutional: Negative for chills and fever.  Skin: Positive for wound.       Objective:   Physical Exam  Constitutional: She is oriented to person, place, and time. She appears well-developed and well-nourished. No distress.  HENT:  Head: Normocephalic and atraumatic.  Eyes: Conjunctivae are normal. Pupils are equal, round, and reactive to light. No scleral icterus.  Neck: Neck supple.  Cardiovascular: Normal rate.  Pulmonary/Chest: Effort normal. No respiratory distress.  Abdominal: She exhibits no distension.  Well healed scar base of the umbilicus. Slight wet appearence to the base of the umbilicus. No active bleeding, no induration, minial tenderness at the umbilicus. No surrounding erythema or induration.    Neurological: She is alert and oriented to person, place, and time.  Skin: Skin is warm and dry. She is not diaphoretic. No erythema.  Vitals reviewed.  Vitals:   01/25/18 1710 01/25/18 1723  BP: (!) 163/89 (!) 144/84  Pulse: 98   Resp: 16   Temp: 98.1 F (36.7 C)   TempSrc: Oral   SpO2: 99%   Weight: 200 lb (90.7 kg)   Height: 5' 3.19" (1.605 m)         Assessment & Plan:    Susan Miles is a 72 y.o. female Abscess of umbilicus - Plan: WOUND CULTURE, doxycycline (VIBRA-TABS) 100 MG tablet  - Suspected abscess that is overall improving but still some  slight discharge at base with odor. Self ruptured with expression of pus earlier in week. Abdominal exam otherwise reassuring  -Check culture, start doxycycline, warm compresses with gentle pressure, RTC precautions if worsening  Meds ordered this encounter  Medications  . doxycycline (VIBRA-TABS) 100 MG tablet    Sig: Take 1 tablet (100 mg total) by mouth 2 (two) times daily.    Dispense:  20 tablet    Refill:  0   Patient Instructions    Start antibiotic for probable infection at belly button. That should continue to improve with warm compresses and antibiotic. Return to the clinic or go to the nearest emergency room if any of your symptoms worsen or new symptoms occur.   Skin Abscess A skin abscess is an infected area on or under your skin that contains a collection of pus and other material. An abscess may also be called a furuncle, carbuncle, or boil. An abscess can occur in or on almost any part of your body. Some abscesses break open (rupture) on their own. Most continue to get worse unless they are treated. The infection can spread deeper into the body and eventually into your blood, which can make you feel ill. Treatment usually involves draining the abscess. What are the causes? An abscess occurs when germs, often bacteria, pass through your skin and cause an infection. This may be caused by:  A scrape or cut on  your skin.  A puncture wound through your skin, including a needle injection.  Blocked oil or sweat glands.  Blocked and infected hair follicles.  A cyst that forms beneath your skin (sebaceous cyst) and becomes infected.  What increases the risk? This condition is more likely to develop in people who:  Have a weak body defense system (immune system).  Have diabetes.  Have dry and irritated skin.  Get frequent injections or use illegal IV drugs.  Have a foreign body in a wound, such as a splinter.  Have problems with their lymph system or veins.  What are the signs or symptoms? An abscess may start as a painful, firm bump under the skin. Over time, the abscess may get larger or become softer. Pus may appear at the top of the abscess, causing pressure and pain. It may eventually break through the skin and drain. Other symptoms include:  Redness.  Warmth.  Swelling.  Tenderness.  A sore on the skin.  How is this diagnosed? This condition is diagnosed based on your medical history and a physical exam. A sample of pus may be taken from the abscess to find out what is causing the infection and what antibiotics can be used to treat it. You also may have:  Blood tests to look for signs of infection or spread of an infection to your blood.  Imaging studies such as ultrasound, CT scan, or MRI if the abscess is deep.  How is this treated? Small abscesses that drain on their own may not need treatment. Treatment for an abscess that does not rupture on its own may include:  Warm compresses applied to the area several times per day.  Incision and drainage. Your health care provider will make an incision to open the abscess and will remove pus and any foreign body or dead tissue. The incision area may be packed with gauze to keep it open for a few days while it heals.  Antibiotic medicines to treat infection. For a severe abscess, you may first get antibiotics through an IV and  then change to oral antibiotics.  Follow these instructions at home: Abscess Care  If you have an abscess that has not drained, place a warm, clean, wet washcloth over the abscess several times a day. Do this as told by your health care provider.  Follow instructions from your health care provider about how to take care of your abscess. Make sure you: ? Cover the abscess with a bandage (dressing). ? Change your dressing or gauze as told by your health care provider. ? Wash your hands with soap and water before you change the dressing or gauze. If soap and water are not available, use hand sanitizer.  Check your abscess every day for signs of a worsening infection. Check for: ? More redness, swelling, or pain. ? More fluid or blood. ? Warmth. ? More pus or a bad smell. Medicines  Take over-the-counter and prescription medicines only as told by your health care provider.  If you were prescribed an antibiotic medicine, take it as told by your health care provider. Do not stop taking the antibiotic even if you start to feel better. General instructions  To avoid spreading the infection: ? Do not share personal care items, towels, or hot tubs with others. ? Avoid making skin contact with other people.  Keep all follow-up visits as told by your health care provider. This is important. Contact a health care provider if:  You have more redness, swelling, or pain around your abscess.  You have more fluid or blood coming from your abscess.  Your abscess feels warm to the touch.  You have more pus or a bad smell coming from your abscess.  You have a fever.  You have muscle aches.  You have chills or a general ill feeling. Get help right away if:  You have severe pain.  You see red streaks on your skin spreading away from the abscess. This information is not intended to replace advice given to you by your health care provider. Make sure you discuss any questions you have with  your health care provider. Document Released: 08/31/2005 Document Revised: 07/17/2016 Document Reviewed: 09/30/2015 Elsevier Interactive Patient Education  2018 [employer].   IF you received an x-ray today, you will receive an invoice from [facility]. Please contact [facility] at [phone] with questions or concerns regarding your invoice.   IF you received labwork today, you will receive an invoice from [place]. Please contact [facility] at [phone] with questions or concerns regarding your invoice.   Our billing staff will not be able to assist you with questions regarding bills from these companies.  You will be contacted with the lab results as soon as they are available. The fastest way to get your results is to activate your My Chart account. Instructions are located on the last page of this paperwork. If you have not heard from [place] regarding the results in 2 weeks, please contact this office.      I personally performed the services described in this documentation, which was scribed in my presence. The recorded information has been reviewed and considered for accuracy and completeness, addended by me as needed, and agree with information above.  Signed,   [name], MD [facility] at [facility].  01/27/18 1:53 PM

## 2018-01-25 NOTE — Patient Instructions (Addendum)
Start antibiotic for probable infection at belly button. That should continue to improve with warm compresses and antibiotic. Return to the clinic or go to the nearest emergency room if any of your symptoms worsen or new symptoms occur.   Skin Abscess A skin abscess is an infected area on or under your skin that contains a collection of pus and other material. An abscess may also be called a furuncle, carbuncle, or boil. An abscess can occur in or on almost any part of your body. Some abscesses break open (rupture) on their own. Most continue to get worse unless they are treated. The infection can spread deeper into the body and eventually into your blood, which can make you feel ill. Treatment usually involves draining the abscess. What are the causes? An abscess occurs when germs, often bacteria, pass through your skin and cause an infection. This may be caused by:  A scrape or cut on your skin.  A puncture wound through your skin, including a needle injection.  Blocked oil or sweat glands.  Blocked and infected hair follicles.  A cyst that forms beneath your skin (sebaceous cyst) and becomes infected.  What increases the risk? This condition is more likely to develop in people who:  Have a weak body defense system (immune system).  Have diabetes.  Have dry and irritated skin.  Get frequent injections or use illegal IV drugs.  Have a foreign body in a wound, such as a splinter.  Have problems with their lymph system or veins.  What are the signs or symptoms? An abscess may start as a painful, firm bump under the skin. Over time, the abscess may get larger or become softer. Pus may appear at the top of the abscess, causing pressure and pain. It may eventually break through the skin and drain. Other symptoms include:  Redness.  Warmth.  Swelling.  Tenderness.  A sore on the skin.  How is this diagnosed? This condition is diagnosed based on your medical history and a  physical exam. A sample of pus may be taken from the abscess to find out what is causing the infection and what antibiotics can be used to treat it. You also may have:  Blood tests to look for signs of infection or spread of an infection to your blood.  Imaging studies such as ultrasound, CT scan, or MRI if the abscess is deep.  How is this treated? Small abscesses that drain on their own may not need treatment. Treatment for an abscess that does not rupture on its own may include:  Warm compresses applied to the area several times per day.  Incision and drainage. Your health care provider will make an incision to open the abscess and will remove pus and any foreign body or dead tissue. The incision area may be packed with gauze to keep it open for a few days while it heals.  Antibiotic medicines to treat infection. For a severe abscess, you may first get antibiotics through an IV and then change to oral antibiotics.  Follow these instructions at home: Abscess Care  If you have an abscess that has not drained, place a warm, clean, wet washcloth over the abscess several times a day. Do this as told by your health care provider.  Follow instructions from your health care provider about how to take care of your abscess. Make sure you: ? Cover the abscess with a bandage (dressing). ? Change your dressing or gauze as told by your health care  provider. ? Wash your hands with soap and water before you change the dressing or gauze. If soap and water are not available, use hand sanitizer.  Check your abscess every day for signs of a worsening infection. Check for: ? More redness, swelling, or pain. ? More fluid or blood. ? Warmth. ? More pus or a bad smell. Medicines  Take over-the-counter and prescription medicines only as told by your health care provider.  If you were prescribed an antibiotic medicine, take it as told by your health care provider. Do not stop taking the antibiotic even if  you start to feel better. General instructions  To avoid spreading the infection: ? Do not share personal care items, towels, or hot tubs with others. ? Avoid making skin contact with other people.  Keep all follow-up visits as told by your health care provider. This is important. Contact a health care provider if:  You have more redness, swelling, or pain around your abscess.  You have more fluid or blood coming from your abscess.  Your abscess feels warm to the touch.  You have more pus or a bad smell coming from your abscess.  You have a fever.  You have muscle aches.  You have chills or a general ill feeling. Get help right away if:  You have severe pain.  You see red streaks on your skin spreading away from the abscess. This information is not intended to replace advice given to you by your health care provider. Make sure you discuss any questions you have with your health care provider. Document Released: 08/31/2005 Document Revised: 07/17/2016 Document Reviewed: 09/30/2015 Elsevier Interactive Patient Education  2018 [employer].   IF you received an x-ray today, you will receive an invoice from [facility]. Please contact [facility] at [phone] with questions or concerns regarding your invoice.   IF you received labwork today, you will receive an invoice from [place]. Please contact [facility] at [phone] with questions or concerns regarding your invoice.   Our billing staff will not be able to assist you with questions regarding bills from these companies.  You will be contacted with the lab results as soon as they are available. The fastest way to get your results is to activate your My Chart account. Instructions are located on the last page of this paperwork. If you have not heard from [place] regarding the results in 2 weeks, please contact this office.

## 2018-01-27 ENCOUNTER — Encounter: Payer: Self-pay | Admitting: Family Medicine

## 2018-01-27 LAB — WOUND CULTURE

## 2018-04-02 ENCOUNTER — Other Ambulatory Visit: Payer: Self-pay

## 2018-04-02 ENCOUNTER — Ambulatory Visit: Payer: Medicare HMO | Admitting: Family Medicine

## 2018-04-02 ENCOUNTER — Encounter: Payer: Self-pay | Admitting: Family Medicine

## 2018-04-02 DIAGNOSIS — E119 Type 2 diabetes mellitus without complications: Secondary | ICD-10-CM | POA: Diagnosis not present

## 2018-04-02 DIAGNOSIS — I10 Essential (primary) hypertension: Secondary | ICD-10-CM | POA: Diagnosis not present

## 2018-04-02 DIAGNOSIS — E11628 Type 2 diabetes mellitus with other skin complications: Secondary | ICD-10-CM | POA: Diagnosis not present

## 2018-04-02 DIAGNOSIS — M109 Gout, unspecified: Secondary | ICD-10-CM | POA: Diagnosis not present

## 2018-04-02 DIAGNOSIS — L02216 Cutaneous abscess of umbilicus: Secondary | ICD-10-CM | POA: Diagnosis not present

## 2018-04-02 DIAGNOSIS — E782 Mixed hyperlipidemia: Secondary | ICD-10-CM

## 2018-04-02 MED ORDER — AMLODIPINE BESYLATE 10 MG PO TABS: 10.0000 mg | ORAL_TABLET | Freq: Every day | ORAL | 1 refills | Status: DC

## 2018-04-02 MED ORDER — FLUTICASONE PROPIONATE 50 MCG/ACT NA SUSP: 1.0000 | Freq: Every day | NASAL | 6 refills | Status: DC

## 2018-04-02 MED ORDER — METFORMIN HCL 1000 MG PO TABS: 1000.0000 mg | ORAL_TABLET | Freq: Two times a day (BID) | ORAL | 1 refills | Status: DC

## 2018-04-02 MED ORDER — ALLOPURINOL 100 MG PO TABS: 200.0000 mg | ORAL_TABLET | Freq: Every day | ORAL | 1 refills | Status: DC

## 2018-04-02 MED ORDER — ATORVASTATIN CALCIUM 20 MG PO TABS: 20.0000 mg | ORAL_TABLET | Freq: Every day | ORAL | 1 refills | Status: DC

## 2018-04-02 MED ORDER — CLONIDINE HCL 0.2 MG PO TABS: ORAL_TABLET | ORAL | 1 refills | Status: DC

## 2018-04-02 MED ORDER — METOPROLOL SUCCINATE ER 200 MG PO TB24: ORAL_TABLET | ORAL | 1 refills | Status: DC

## 2018-04-02 MED ORDER — SITAGLIPTIN PHOSPHATE 100 MG PO TABS: 100.0000 mg | ORAL_TABLET | Freq: Every day | ORAL | 1 refills | Status: DC

## 2018-04-02 MED ORDER — DOXYCYCLINE HYCLATE 100 MG PO TABS: 100.0000 mg | ORAL_TABLET | Freq: Two times a day (BID) | ORAL | 0 refills | Status: DC

## 2018-04-02 MED ORDER — CLONIDINE HCL 0.1 MG PO TABS: 0.1000 mg | ORAL_TABLET | Freq: Two times a day (BID) | ORAL | 1 refills | Status: DC

## 2018-04-02 MED ORDER — GLIPIZIDE 10 MG PO TABS: ORAL_TABLET | ORAL | 1 refills | Status: DC

## 2018-04-02 MED ORDER — LOSARTAN POTASSIUM 100 MG PO TABS: 100.0000 mg | ORAL_TABLET | Freq: Every day | ORAL | 1 refills | Status: DC

## 2018-04-02 NOTE — Patient Instructions (Addendum)
For belly button, if that issue returns please return right away for recheck.   For diabetes, may need to change meds as discussed prior.  I would consider changing Januvia to different class - SGLT2 or GLP1. I will let you know about the results first.   Blood pressure running a little too high. take additional 0.1mg  clonidine once per day for 1 week, then take that dose twice per day with your 0.2mg  pills (total dose will be 0.3mg  twice per day).  No change in other blood pressure meds for now. I will also refer you to nephrologist to help in managing blood pressure.   Recheck 3 months.    Managing Your Hypertension Hypertension is commonly called high blood pressure. This is when the force of your blood pressing against the walls of your arteries is too strong. Arteries are blood vessels that carry blood from your heart throughout your body. Hypertension forces the heart to work harder to pump blood, and may cause the arteries to become narrow or stiff. Having untreated or uncontrolled hypertension can cause heart attack, stroke, kidney disease, and other problems. What are blood pressure readings? A blood pressure reading consists of a higher number over a lower number. Ideally, your blood pressure should be below 120/80. The first ("top") number is called the systolic pressure. It is a measure of the pressure in your arteries as your heart beats. The second ("bottom") number is called the diastolic pressure. It is a measure of the pressure in your arteries as the heart relaxes. What does my blood pressure reading mean? Blood pressure is classified into four stages. Based on your blood pressure reading, your health care provider may use the following stages to determine what type of treatment you need, if any. Systolic pressure and diastolic pressure are measured in a unit called mm Hg. Normal  Systolic pressure: below 629.  Diastolic pressure: below 80. Elevated  Systolic pressure:  528-413.  Diastolic pressure: below 80. Hypertension stage 1  Systolic pressure: 244-010.  Diastolic pressure: 27-25. Hypertension stage 2  Systolic pressure: 366 or above.  Diastolic pressure: 90 or above. What health risks are associated with hypertension? Managing your hypertension is an important responsibility. Uncontrolled hypertension can lead to:  A heart attack.  A stroke.  A weakened blood vessel (aneurysm).  Heart failure.  Kidney damage.  Eye damage.  Metabolic syndrome.  Memory and concentration problems.  What changes can I make to manage my hypertension? Hypertension can be managed by making lifestyle changes and possibly by taking medicines. Your health care provider will help you make a plan to bring your blood pressure within a normal range. Eating and drinking  Eat a diet that is high in fiber and potassium, and low in salt (sodium), added sugar, and fat. An example eating plan is called the DASH (Dietary Approaches to Stop Hypertension) diet. To eat this way: ? Eat plenty of fresh fruits and vegetables. Try to fill half of your plate at each meal with fruits and vegetables. ? Eat whole grains, such as whole wheat pasta, brown rice, or whole grain bread. Fill about one quarter of your plate with whole grains. ? Eat low-fat diary products. ? Avoid fatty cuts of meat, processed or cured meats, and poultry with skin. Fill about one quarter of your plate with lean proteins such as fish, chicken without skin, beans, eggs, and tofu. ? Avoid premade and processed foods. These tend to be higher in sodium, added sugar, and fat.  Reduce your daily sodium intake. Most people with hypertension should eat less than 1,500 mg of sodium a day.  Limit alcohol intake to no more than 1 drink a day for nonpregnant women and 2 drinks a day for men. One drink equals 12 oz of beer, 5 oz of wine, or 1 oz of hard liquor. Lifestyle  Work with your health care provider to  maintain a healthy body weight, or to lose weight. Ask what an ideal weight is for you.  Get at least 30 minutes of exercise that causes your heart to beat faster (aerobic exercise) most days of the week. Activities may include walking, swimming, or biking.  Include exercise to strengthen your muscles (resistance exercise), such as weight lifting, as part of your weekly exercise routine. Try to do these types of exercises for 30 minutes at least 3 days a week.  Do not use any products that contain nicotine or tobacco, such as cigarettes and e-cigarettes. If you need help quitting, ask your health care provider.  Control any long-term (chronic) conditions you have, such as high cholesterol or diabetes. Monitoring  Monitor your blood pressure at home as told by your health care provider. Your personal target blood pressure may vary depending on your medical conditions, your age, and other factors.  Have your blood pressure checked regularly, as often as told by your health care provider. Working with your health care provider  Review all the medicines you take with your health care provider because there may be side effects or interactions.  Talk with your health care provider about your diet, exercise habits, and other lifestyle factors that may be contributing to hypertension.  Visit your health care provider regularly. Your health care provider can help you create and adjust your plan for managing hypertension. Will I need medicine to control my blood pressure? Your health care provider may prescribe medicine if lifestyle changes are not enough to get your blood pressure under control, and if:  Your systolic blood pressure is 130 or higher.  Your diastolic blood pressure is 80 or higher.  Take medicines only as told by your health care provider. Follow the directions carefully. Blood pressure medicines must be taken as prescribed. The medicine does not work as well when you skip doses.  Skipping doses also puts you at risk for problems. Contact a health care provider if:  You think you are having a reaction to medicines you have taken.  You have repeated (recurrent) headaches.  You feel dizzy.  You have swelling in your ankles.  You have trouble with your vision. Get help right away if:  You develop a severe headache or confusion.  You have unusual weakness or numbness, or you feel faint.  You have severe pain in your chest or abdomen.  You vomit repeatedly.  You have trouble breathing. Summary  Hypertension is when the force of blood pumping through your arteries is too strong. If this condition is not controlled, it may put you at risk for serious complications.  Your personal target blood pressure may vary depending on your medical conditions, your age, and other factors. For most people, a normal blood pressure is less than 120/80.  Hypertension is managed by lifestyle changes, medicines, or both. Lifestyle changes include weight loss, eating a healthy, low-sodium diet, exercising more, and limiting alcohol. This information is not intended to replace advice given to you by your health care provider. Make sure you discuss any questions you have with your health care  provider. Document Released: 08/15/2012 Document Revised: 10/19/2016 Document Reviewed: 10/19/2016 Elsevier Interactive Patient Education  2018 [employer].    IF you received an x-ray today, you will receive an invoice from [facility]. Please contact [facility] at [phone] with questions or concerns regarding your invoice.   IF you received labwork today, you will receive an invoice from [place]. Please contact [facility] at [phone] with questions or concerns regarding your invoice.   Our billing staff will not be able to assist you with questions regarding bills from these companies.  You will be contacted with the lab results as soon as they are available.  The fastest way to get your results is to activate your My Chart account. Instructions are located on the last page of this paperwork. If you have not heard from [place] regarding the results in 2 weeks, please contact this office.

## 2018-04-02 NOTE — Progress Notes (Signed)
Subjective:  By signing my name below, I, Susan Miles, attest that this documentation has been prepared under the direction and in the presence of Susan Agreste, MD Electronically Signed: Ladene Artist, ED Scribe 04/02/2018 at 3:52 PM.   Patient ID: Susan Miles, female    DOB: November 07, 1946, 72 y.o.   MRN: [id number]  Chief Complaint  Patient presents with   Chronic Conditions    2 month follow up & infection in bellybutton follow up     HPI Susan Miles is a 72 y.o. female who presents to Primary Care at [facility] for f/u.  Previous Cellulitis Umbilicus Abscess on 9/03. Treated with doxycycline x 10 days. Wound culture with mixed skin flora. - Pt states the wound had improved since Feb but reopened last wk, has improved again.  DM Lab Results  Component Value Date   HGBA1C 7.6 (H) 12/21/2017  Options of changing meds was discussed. Urine micro albumin in Jan 91. Optho exam 04/13/17. Foot exam: 04/27/17. Pneumovax 2016. Metformin 1000 mg bid, glipizide 20 mg bid, Januvia 100 mg qd. - Pt reports home blood glucose readings ranging from 97-180. States sometimes she exercises daily, sometimes she exercises 2-3 days/wk. Denies new side-effects.  Hyperlipidemia Lab Results  Component Value Date   CHOL 161 12/21/2017   HDL 48 12/21/2017   LDLCALC 91 12/21/2017   TRIG 108 12/21/2017   CHOLHDL 3.4 12/21/2017   Lab Results  Component Value Date   ALT 12 12/21/2017   AST 16 12/21/2017   ALKPHOS 79 12/21/2017   BILITOT 0.4 12/21/2017  Lipitor 20 mg qd.  HTN Lab Results  Component Value Date   CREATININE 1.09 (H) 12/21/2017  Improved from 1.36 4 months ago. Losartan 100 mg qd, clonidine 0.2 mg bid, Toprol 200 mg qd  BP Readings from Last 3 Encounters:  04/02/18 (!) 158/84  01/25/18 (!) 144/84  12/21/17 138/88   H/o Gout Uric acid of 5.1 in Jan. Colchicine prn and allopurinol 200 mg qd. - Pt does not recall when her last flare was.  Patient Active Problem List   Diagnosis Date Noted   Hyperlipidemia 12/21/2017   Breast asymmetry 05/01/2015   Cataract, nuclear 05/24/2012   HTN (hypertension) 02/10/2012   DM2 (diabetes mellitus, type 2) ([place]) 02/10/2012   Past Medical History:  Diagnosis Date   Diabetes mellitus without complication ([place])    Hypertension    No past surgical history on file. Allergies  Allergen Reactions   Ace Inhibitors Cough   Prior to Admission medications   Medication Sig Start Date End Date Taking? Authorizing Provider  acetaminophen (TYLENOL) 500 MG tablet Take 500 mg by mouth every 6 (six) hours as needed for mild pain.   Yes [provider]  allopurinol (ZYLOPRIM) 100 MG tablet Take 2 tablets (200 mg total) by mouth daily. 12/21/17  Yes Susan Agreste, MD  amLODipine (NORVASC) 10 MG tablet Take 1 tablet (10 mg total) by mouth daily. 12/21/17  Yes Susan Agreste, MD  aspirin 81 MG tablet Take 81 mg by mouth daily.   Yes [provider]  atorvastatin (LIPITOR) 20 MG tablet Take 1 tablet (20 mg total) by mouth daily at 6 PM. 12/21/17  Yes Susan Agreste, MD  Blood Glucose Monitoring Suppl (LDR BLOOD GLUCOSE TRUETEST) w/Device KIT 1 Device by Does not apply route daily. Test blood sugar once daily. Dx: E11.9 01/24/18  Yes Susan Agreste, MD  cetirizine (ZYRTEC) 10 MG tablet Take 1 tablet (  10 mg total) by mouth daily. 09/21/15  Yes Susan Agreste, MD  cloNIDine (CATAPRES) 0.2 MG tablet TAKE 1 TABLET (0.2 MG TOTAL) BY MOUTH 2 TIMES DAILY 12/21/17  Yes Susan Agreste, MD  colchicine 0.6 MG tablet 2 tablets once, then 1 tablet in 1 hour if needed for pain. No further doses per attack. Patient taking differently: Take 0.6 mg by mouth daily as needed (gout attack). 2 tablets once, then 1 tablet in 1 hour if needed for pain. No further doses per attack. 01/12/17  Yes Susan Agreste, MD  doxycycline (VIBRA-TABS) 100 MG tablet Take 1 tablet (100 mg total) by mouth 2 (two) times daily. 01/25/18  Yes  Susan Agreste, MD  fluticasone [facility]) 50 MCG/ACT nasal spray Place 1-2 sprays into both nostrils daily. 12/21/17  Yes Susan Agreste, MD  glipiZIDE (GLUCOTROL) 10 MG tablet TAKE 2 TABLETS (20 MG TOTAL) BY MOUTH 2 (TWO) TIMES DAILY BEFORE A MEAL. 12/21/17  Yes Susan Agreste, MD  glucose blood (ACCU-CHEK AVIVA PLUS) test strip USE TO TEST BLOOD SUGAR ONCE DAILY. DX E11.9 01/24/18  Yes Susan Agreste, MD  HYDROcodone-acetaminophen (NORCO) 5-325 MG tablet Take 1 tablet by mouth every 6 (six) hours as needed for moderate pain. 01/12/17  Yes Susan Agreste, MD  Lancets MISC Test blood sugar once daily. Dx: E11.9 01/24/18  Yes Susan Agreste, MD  losartan (COZAAR) 100 MG tablet Take 1 tablet (100 mg total) by mouth daily. 12/21/17  Yes Susan Agreste, MD  metFORMIN (GLUCOPHAGE) 1000 MG tablet Take 1 tablet (1,000 mg total) by mouth 2 (two) times daily with a meal. 12/21/17  Yes Susan Agreste, MD  metoprolol (TOPROL-XL) 200 MG 24 hr tablet TAKE 1 TABLET (200 MG TOTAL) BY MOUTH DAILY. 12/21/17  Yes Susan Agreste, MD  ondansetron (ZOFRAN ODT) 8 MG disintegrating tablet Take 1 tablet (8 mg total) by mouth every 8 (eight) hours as needed for nausea or vomiting. 11/21/17  Yes Jola Schmidt, MD  sitaGLIPtin (JANUVIA) 100 MG tablet Take 1 tablet (100 mg total) by mouth daily. 12/21/17  Yes Susan Agreste, MD   Social History   Socioeconomic History   Marital status: Married    Spouse name: Not on file   Number of children: Not on file   Years of education: Not on file   Highest education level: Not on file  Occupational History   Not on file  Social Needs   Financial resource strain: Not on file   Food insecurity:    Worry: Not on file    Inability: Not on file   Transportation needs:    Medical: Not on file    Non-medical: Not on file  Tobacco Use   Smoking status: Never Smoker   Smokeless tobacco: Never Used  Substance and Sexual Activity   Alcohol use: No     Drug use: No   Sexual activity: Not on file  Lifestyle   Physical activity:    Days per week: Not on file    Minutes per session: Not on file   Stress: Not on file  Relationships   Social connections:    Talks on phone: Not on file    Gets together: Not on file    Attends religious service: Not on file    Active member of club or organization: Not on file    Attends meetings of clubs or organizations: Not on file    Relationship status: Not  on file   Intimate partner violence:    Fear of current or ex partner: Not on file    Emotionally abused: Not on file    Physically abused: Not on file    Forced sexual activity: Not on file  Other Topics Concern   Not on file  Social History Narrative   Not on file   Review of Systems  Constitutional: Negative for fatigue and unexpected weight change.  Respiratory: Negative for chest tightness and shortness of breath.   Cardiovascular: Negative for chest pain, palpitations and leg swelling.  Gastrointestinal: Negative for abdominal pain and blood in stool.  Skin: Positive for wound (improving).  Neurological: Negative for dizziness, syncope, light-headedness and headaches.      Objective:   Physical Exam  Constitutional: She is oriented to person, place, and time. She appears well-developed and well-nourished.  HENT:  Head: Normocephalic and atraumatic.  Eyes: Pupils are equal, round, and reactive to light. Conjunctivae and EOM are normal.  Neck: Carotid bruit is not present.  Cardiovascular: Normal rate, regular rhythm, normal heart sounds and intact distal pulses.  Pulmonary/Chest: Effort normal and breath sounds normal.  Abdominal: Soft. She exhibits no pulsatile midline mass. There is no tenderness.  Umbilicus appears dry. No discharge or induration.  Neurological: She is alert and oriented to person, place, and time.  Skin: Skin is warm and dry.  Psychiatric: She has a normal mood and affect. Her behavior is normal.   Vitals reviewed.  Vitals:   04/02/18 1446 04/02/18 1611  BP: (!) 158/84 (!) 184/96  Pulse: 79 64  Temp: 98.2 F (36.8 C)   TempSrc: Oral   SpO2: (!) 79% 98%  Weight: 200 lb 9.6 oz (91 kg)   Height: _0  (1.626 m)       Assessment & Plan:    JAHNA LIEBERT is a 72 y.o. female Type 2 diabetes mellitus without complication, without long-term current use of insulin ([place]) - Plan: Ambulatory referral to Nephrology, sitaGLIPtin (JANUVIA) 100 MG tablet, metFORMIN (GLUCOPHAGE) 1000 MG tablet, glipiZIDE (GLUCOTROL) 10 MG tablet, Hemoglobin A1c  -Previously uncontrolled, will check levels, may need to consider other class of meds versus insulin.  No changes for now  Essential hypertension - Plan: Ambulatory referral to Nephrology, metoprolol (TOPROL-XL) 200 MG 24 hr tablet, losartan (COZAAR) 100 MG tablet, cloNIDine (CATAPRES) 0.2 MG tablet, amLODipine (NORVASC) 10 MG tablet, Basic metabolic panel  -Decreased control, will add additional 0.1 mg clonidine once per day, then increase to twice per day with other dose of 0.2 mg.  Based on continued need for additional medications and current med regimen will refer to nephrology for evaluation and assistance in management.  Abscess of umbilicus - Plan: doxycycline (VIBRA-TABS) 100 MG tablet  -Stable at this time, RTC precautions if recurrence of symptoms.  Mixed hyperlipidemia - Plan: atorvastatin (LIPITOR) 20 MG tablet  -Tolerating statin at current dose, continue Lipitor 20 mg daily.  Gout of left foot, unspecified cause, unspecified chronicity - Plan: allopurinol (ZYLOPRIM) 100 MG tablet  -Stable on allopurinol, tolerating medication, RTC precautions if increasing flare frequency  Meds ordered this encounter  Medications   cloNIDine (CATAPRES) 0.1 MG tablet    Sig: Take 1 tablet (0.1 mg total) by mouth 2 (two) times daily.    Dispense:  180 tablet    Refill:  1   sitaGLIPtin (JANUVIA) 100 MG tablet    Sig: Take 1 tablet (100 mg total)  by mouth daily.    Dispense:  90 tablet    Refill:  1    PT REQUESTING 90 DAY SUPPLY   metoprolol (TOPROL-XL) 200 MG 24 hr tablet    Sig: TAKE 1 TABLET (200 MG TOTAL) BY MOUTH DAILY.    Dispense:  90 tablet    Refill:  1   metFORMIN (GLUCOPHAGE) 1000 MG tablet    Sig: Take 1 tablet (1,000 mg total) by mouth 2 (two) times daily with a meal.    Dispense:  180 tablet    Refill:  1   losartan (COZAAR) 100 MG tablet    Sig: Take 1 tablet (100 mg total) by mouth daily.    Dispense:  90 tablet    Refill:  1   glipiZIDE (GLUCOTROL) 10 MG tablet    Sig: TAKE 2 TABLETS (20 MG TOTAL) BY MOUTH 2 (TWO) TIMES DAILY BEFORE A MEAL.    Dispense:  360 tablet    Refill:  1   fluticasone (FLONASE) 50 MCG/ACT nasal spray    Sig: Place 1-2 sprays into both nostrils daily.    Dispense:  16 g    Refill:  6   doxycycline (VIBRA-TABS) 100 MG tablet    Sig: Take 1 tablet (100 mg total) by mouth 2 (two) times daily.    Dispense:  20 tablet    Refill:  0   cloNIDine (CATAPRES) 0.2 MG tablet    Sig: TAKE 1 TABLET (0.2 MG TOTAL) BY MOUTH 2 TIMES DAILY    Dispense:  180 tablet    Refill:  1   atorvastatin (LIPITOR) 20 MG tablet    Sig: Take 1 tablet (20 mg total) by mouth daily at 6 PM.    Dispense:  90 tablet    Refill:  1   amLODipine (NORVASC) 10 MG tablet    Sig: Take 1 tablet (10 mg total) by mouth daily.    Dispense:  90 tablet    Refill:  1   allopurinol (ZYLOPRIM) 100 MG tablet    Sig: Take 2 tablets (200 mg total) by mouth daily.    Dispense:  180 tablet    Refill:  1   Patient Instructions    For belly button, if that issue returns please return right away for recheck.   For diabetes, may need to change meds as discussed prior.  I would consider changing Januvia to different class - SGLT2 or GLP1. I will let you know about the results first.   Blood pressure running a little too high. take additional 0.71m clonidine once per day for 1 week, then take that dose twice per day  with your 0.219mpills (total dose will be 0.10m69mwice per day).  No change in other blood pressure meds for now. I will also refer you to nephrologist to help in managing blood pressure.   Recheck 3 months.    Managing Your Hypertension Hypertension is commonly called high blood pressure. This is when the force of your blood pressing against the walls of your arteries is too strong. Arteries are blood vessels that carry blood from your heart throughout your body. Hypertension forces the heart to work harder to pump blood, and may cause the arteries to become narrow or stiff. Having untreated or uncontrolled hypertension can cause heart attack, stroke, kidney disease, and other problems. What are blood pressure readings? A blood pressure reading consists of a higher number over a lower number. Ideally, your blood pressure should be below 120/80. The first ("top") number is called the  systolic pressure. It is a measure of the pressure in your arteries as your heart beats. The second ("bottom") number is called the diastolic pressure. It is a measure of the pressure in your arteries as the heart relaxes. What does my blood pressure reading mean? Blood pressure is classified into four stages. Based on your blood pressure reading, your health care provider may use the following stages to determine what type of treatment you need, if any. Systolic pressure and diastolic pressure are measured in a unit called mm Hg. Normal  Systolic pressure: below 836.  Diastolic pressure: below 80. Elevated  Systolic pressure: 629-476.  Diastolic pressure: below 80. Hypertension stage 1  Systolic pressure: 546-503.  Diastolic pressure: 54-65. Hypertension stage 2  Systolic pressure: 681 or above.  Diastolic pressure: 90 or above. What health risks are associated with hypertension? Managing your hypertension is an important responsibility. Uncontrolled hypertension can lead to:  A heart attack.  A  stroke.  A weakened blood vessel (aneurysm).  Heart failure.  Kidney damage.  Eye damage.  Metabolic syndrome.  Memory and concentration problems.  What changes can I make to manage my hypertension? Hypertension can be managed by making lifestyle changes and possibly by taking medicines. Your health care provider will help you make a plan to bring your blood pressure within a normal range. Eating and drinking  Eat a diet that is high in fiber and potassium, and low in salt (sodium), added sugar, and fat. An example eating plan is called the DASH (Dietary Approaches to Stop Hypertension) diet. To eat this way: ? Eat plenty of fresh fruits and vegetables. Try to fill half of your plate at each meal with fruits and vegetables. ? Eat whole grains, such as whole wheat pasta, brown rice, or whole grain bread. Fill about one quarter of your plate with whole grains. ? Eat low-fat diary products. ? Avoid fatty cuts of meat, processed or cured meats, and poultry with skin. Fill about one quarter of your plate with lean proteins such as fish, chicken without skin, beans, eggs, and tofu. ? Avoid premade and processed foods. These tend to be higher in sodium, added sugar, and fat.  Reduce your daily sodium intake. Most people with hypertension should eat less than 1,500 mg of sodium a day.  Limit alcohol intake to no more than 1 drink a day for nonpregnant women and 2 drinks a day for men. One drink equals 12 oz of beer, 5 oz of wine, or 1 oz of hard liquor. Lifestyle  Work with your health care provider to maintain a healthy body weight, or to lose weight. Ask what an ideal weight is for you.  Get at least 30 minutes of exercise that causes your heart to beat faster (aerobic exercise) most days of the week. Activities may include walking, swimming, or biking.  Include exercise to strengthen your muscles (resistance exercise), such as weight lifting, as part of your weekly exercise routine. Try  to do these types of exercises for 30 minutes at least 3 days a week.  Do not use any products that contain nicotine or tobacco, such as cigarettes and e-cigarettes. If you need help quitting, ask your health care provider.  Control any long-term (chronic) conditions you have, such as high cholesterol or diabetes. Monitoring  Monitor your blood pressure at home as told by your health care provider. Your personal target blood pressure may vary depending on your medical conditions, your age, and other factors.  Have your  blood pressure checked regularly, as often as told by your health care provider. Working with your health care provider  Review all the medicines you take with your health care provider because there may be side effects or interactions.  Talk with your health care provider about your diet, exercise habits, and other lifestyle factors that may be contributing to hypertension.  Visit your health care provider regularly. Your health care provider can help you create and adjust your plan for managing hypertension. Will I need medicine to control my blood pressure? Your health care provider may prescribe medicine if lifestyle changes are not enough to get your blood pressure under control, and if:  Your systolic blood pressure is 130 or higher.  Your diastolic blood pressure is 80 or higher.  Take medicines only as told by your health care provider. Follow the directions carefully. Blood pressure medicines must be taken as prescribed. The medicine does not work as well when you skip doses. Skipping doses also puts you at risk for problems. Contact a health care provider if:  You think you are having a reaction to medicines you have taken.  You have repeated (recurrent) headaches.  You feel dizzy.  You have swelling in your ankles.  You have trouble with your vision. Get help right away if:  You develop a severe headache or confusion.  You have unusual weakness or  numbness, or you feel faint.  You have severe pain in your chest or abdomen.  You vomit repeatedly.  You have trouble breathing. Summary  Hypertension is when the force of blood pumping through your arteries is too strong. If this condition is not controlled, it may put you at risk for serious complications.  Your personal target blood pressure may vary depending on your medical conditions, your age, and other factors. For most people, a normal blood pressure is less than 120/80.  Hypertension is managed by lifestyle changes, medicines, or both. Lifestyle changes include weight loss, eating a healthy, low-sodium diet, exercising more, and limiting alcohol. This information is not intended to replace advice given to you by your health care provider. Make sure you discuss any questions you have with your health care provider. Document Released: 08/15/2012 Document Revised: 10/19/2016 Document Reviewed: 10/19/2016 Elsevier Interactive Patient Education  2018 [employer].    IF you received an x-ray today, you will receive an invoice from [facility]. Please contact [facility] at [phone] with questions or concerns regarding your invoice.   IF you received labwork today, you will receive an invoice from [place]. Please contact [facility] at [phone] with questions or concerns regarding your invoice.   Our billing staff will not be able to assist you with questions regarding bills from these companies.  You will be contacted with the lab results as soon as they are available. The fastest way to get your results is to activate your My Chart account. Instructions are located on the last page of this paperwork. If you have not heard from [place] regarding the results in 2 weeks, please contact this office.       I personally performed the services described in this documentation, which was scribed in my presence. The recorded information has been reviewed and considered  for accuracy and completeness, addended by me as needed, and agree with information above.  Signed,   [name], MD Primary Care at [place].  04/04/18 8:59 PM

## 2018-04-03 LAB — BASIC METABOLIC PANEL
BUN/Creatinine Ratio: 16 (ref 12–28)
BUN: 15 mg/dL (ref 8–27)
CHLORIDE: 105 mmol/L (ref 96–106)
CO2: 21 mmol/L (ref 20–29)
Calcium: 10.9 mg/dL — ABNORMAL HIGH (ref 8.7–10.3)
Creatinine, Ser: 0.96 mg/dL (ref 0.57–1.00)
GFR calc non Af Amer: 60 mL/min/{1.73_m2} (ref >59)
GFR, EST AFRICAN AMERICAN: 69 mL/min/{1.73_m2} (ref >59)
Glucose: 90 mg/dL (ref 65–99)
POTASSIUM: 4.2 mmol/L (ref 3.5–5.2)
Sodium: 141 mmol/L (ref 134–144)

## 2018-04-03 LAB — HEMOGLOBIN A1C
Est. average glucose Bld gHb Est-mCnc: 163 mg/dL
Hgb A1c MFr Bld: 7.3 % — ABNORMAL HIGH (ref 4.8–5.6)

## 2018-04-18 ENCOUNTER — Other Ambulatory Visit: Payer: Self-pay | Admitting: Family Medicine

## 2018-04-18 DIAGNOSIS — E119 Type 2 diabetes mellitus without complications: Secondary | ICD-10-CM | POA: Diagnosis not present

## 2018-04-18 DIAGNOSIS — H524 Presbyopia: Secondary | ICD-10-CM | POA: Diagnosis not present

## 2018-04-18 DIAGNOSIS — I10 Essential (primary) hypertension: Secondary | ICD-10-CM | POA: Diagnosis not present

## 2018-04-18 DIAGNOSIS — H52223 Regular astigmatism, bilateral: Secondary | ICD-10-CM | POA: Diagnosis not present

## 2018-04-18 DIAGNOSIS — H25099 Other age-related incipient cataract, unspecified eye: Secondary | ICD-10-CM | POA: Diagnosis not present

## 2018-04-18 DIAGNOSIS — Z7984 Long term (current) use of oral hypoglycemic drugs: Secondary | ICD-10-CM | POA: Diagnosis not present

## 2018-04-18 DIAGNOSIS — H5213 Myopia, bilateral: Secondary | ICD-10-CM | POA: Diagnosis not present

## 2018-04-18 LAB — HM DIABETES EYE EXAM

## 2018-04-18 NOTE — Progress Notes (Signed)
pth as lab only order for hypercalcemia

## 2018-04-20 ENCOUNTER — Telehealth: Payer: Self-pay

## 2018-04-20 NOTE — Telephone Encounter (Signed)
Copied from [place]. Topic: General - Other >> Apr 19, 2018  2:01 PM Yvette Rack wrote: Reason for CRM: Pt requested a call back from Dr. Rolly Salter nurse. Pt states she had labs done but she has not heard from anyone in regards to the lab results. Cb# [phone]

## 2018-04-20 NOTE — Telephone Encounter (Signed)
Copied from [place]. Topic: General - Other >> Apr 19, 2018  2:01 PM Yvette Rack wrote: Reason for CRM: Pt requested a call back from Dr. Rolly Salter nurse. Pt states she had labs done but she has not heard from anyone in regards to the lab results. Cb# [phone]   Calcium is still mildly elevated. I would like to check a PTH level with repeat calcium at a lab only visit. I will place that order and she can return at her convenience. Hemoglobin A1c has improved from 7.6-7.3. Ideally want to have that below 7.0. We can certainly change to an SGLT2 or GLP-1 medication as we discussed last visit, as that will likely get her where she needs to be. Let me know if that is okay and I can send in a prescription in place of the [place].  LMOVM - crm completed

## 2018-04-26 ENCOUNTER — Ambulatory Visit (INDEPENDENT_AMBULATORY_CARE_PROVIDER_SITE_OTHER): Payer: Medicare HMO | Admitting: Family Medicine

## 2018-04-26 NOTE — Progress Notes (Signed)
Lab only visit 

## 2018-04-26 NOTE — Progress Notes (Signed)
Pt here for lab only visit. Pt was not seen by provider

## 2018-04-27 LAB — PTH, INTACT AND CALCIUM
CALCIUM: 11 mg/dL — AB (ref 8.7–10.3)
PTH: 68 pg/mL — AB (ref 15–65)

## 2018-05-02 ENCOUNTER — Other Ambulatory Visit: Payer: Self-pay | Admitting: Family Medicine

## 2018-05-02 DIAGNOSIS — Z1231 Encounter for screening mammogram for malignant neoplasm of breast: Secondary | ICD-10-CM

## 2018-05-14 ENCOUNTER — Encounter: Payer: Self-pay | Admitting: *Deleted

## 2018-05-15 ENCOUNTER — Other Ambulatory Visit: Payer: Self-pay | Admitting: *Deleted

## 2018-05-17 ENCOUNTER — Other Ambulatory Visit: Payer: Self-pay | Admitting: Family Medicine

## 2018-05-17 DIAGNOSIS — E213 Hyperparathyroidism, unspecified: Secondary | ICD-10-CM

## 2018-05-17 NOTE — Progress Notes (Signed)
See lab notes

## 2018-05-22 ENCOUNTER — Encounter: Payer: Self-pay | Admitting: Family Medicine

## 2018-05-22 DIAGNOSIS — N182 Chronic kidney disease, stage 2 (mild): Secondary | ICD-10-CM | POA: Diagnosis not present

## 2018-05-22 DIAGNOSIS — I129 Hypertensive chronic kidney disease with stage 1 through stage 4 chronic kidney disease, or unspecified chronic kidney disease: Secondary | ICD-10-CM | POA: Diagnosis not present

## 2018-05-22 DIAGNOSIS — R809 Proteinuria, unspecified: Secondary | ICD-10-CM | POA: Diagnosis not present

## 2018-05-22 DIAGNOSIS — I10 Essential (primary) hypertension: Secondary | ICD-10-CM | POA: Diagnosis not present

## 2018-05-24 DIAGNOSIS — M109 Gout, unspecified: Secondary | ICD-10-CM | POA: Diagnosis not present

## 2018-05-26 ENCOUNTER — Other Ambulatory Visit: Payer: Self-pay | Admitting: Nephrology

## 2018-05-26 DIAGNOSIS — N182 Chronic kidney disease, stage 2 (mild): Secondary | ICD-10-CM

## 2018-05-30 DIAGNOSIS — E559 Vitamin D deficiency, unspecified: Secondary | ICD-10-CM | POA: Diagnosis not present

## 2018-05-30 DIAGNOSIS — I10 Essential (primary) hypertension: Secondary | ICD-10-CM | POA: Diagnosis not present

## 2018-05-30 DIAGNOSIS — E210 Primary hyperparathyroidism: Secondary | ICD-10-CM | POA: Diagnosis not present

## 2018-06-11 ENCOUNTER — Ambulatory Visit: Payer: Commercial Managed Care - HMO

## 2018-06-12 DIAGNOSIS — Z780 Asymptomatic menopausal state: Secondary | ICD-10-CM | POA: Diagnosis not present

## 2018-06-13 ENCOUNTER — Ambulatory Visit
Admission: RE | Admit: 2018-06-13 | Discharge: 2018-06-13 | Disposition: A | Discharge status: Home / Self Care | Payer: Medicare HMO | Source: Ambulatory Visit | Attending: Family Medicine | Admitting: Family Medicine

## 2018-06-13 ENCOUNTER — Other Ambulatory Visit: Payer: Commercial Managed Care - HMO

## 2018-06-13 DIAGNOSIS — Z1231 Encounter for screening mammogram for malignant neoplasm of breast: Secondary | ICD-10-CM

## 2018-06-19 ENCOUNTER — Ambulatory Visit
Admission: RE | Admit: 2018-06-19 | Discharge: 2018-06-19 | Disposition: A | Discharge status: Home / Self Care | Payer: Commercial Managed Care - HMO | Source: Ambulatory Visit | Attending: Nephrology | Admitting: Nephrology

## 2018-06-19 DIAGNOSIS — N182 Chronic kidney disease, stage 2 (mild): Secondary | ICD-10-CM

## 2018-06-19 DIAGNOSIS — I129 Hypertensive chronic kidney disease with stage 1 through stage 4 chronic kidney disease, or unspecified chronic kidney disease: Secondary | ICD-10-CM | POA: Diagnosis not present

## 2018-07-02 ENCOUNTER — Encounter: Payer: Self-pay | Admitting: Family Medicine

## 2018-07-02 ENCOUNTER — Ambulatory Visit (INDEPENDENT_AMBULATORY_CARE_PROVIDER_SITE_OTHER): Payer: Medicare HMO | Admitting: Family Medicine

## 2018-07-02 ENCOUNTER — Other Ambulatory Visit: Payer: Self-pay

## 2018-07-02 VITALS — BP 132/80 | HR 67 | Temp 99.0°F | Ht 63.0 in | Wt 202.8 lb

## 2018-07-02 DIAGNOSIS — E1129 Type 2 diabetes mellitus with other diabetic kidney complication: Secondary | ICD-10-CM | POA: Diagnosis not present

## 2018-07-02 DIAGNOSIS — I10 Essential (primary) hypertension: Secondary | ICD-10-CM | POA: Diagnosis not present

## 2018-07-02 DIAGNOSIS — R809 Proteinuria, unspecified: Secondary | ICD-10-CM

## 2018-07-02 NOTE — Patient Instructions (Addendum)
Depending on your A1c  today, I would recommend changing the Januvia to different class (GLP-1) injections, or SGLT2 pills.   Some form of exercise most days per week.   No change in blood pressure meds for now.   Thanks for coming in today.     IF you received an x-ray today, you will receive an invoice from [facility]. Please contact [facility] at [phone] with questions or concerns regarding your invoice.   IF you received labwork today, you will receive an invoice from [place]. Please contact [facility] at [phone] with questions or concerns regarding your invoice.   Our billing staff will not be able to assist you with questions regarding bills from these companies.  You will be contacted with the lab results as soon as they are available. The fastest way to get your results is to activate your My Chart account. Instructions are located on the last page of this paperwork. If you have not heard from [place] regarding the results in 2 weeks, please contact this office.

## 2018-07-02 NOTE — Progress Notes (Signed)
Subjective:    Patient ID: Susan Miles, female    DOB: 08/31/1946, 72 y.o.   MRN: [id number]  HPI Susan Miles is a 72 y.o. female Presents today for: Chief Complaint  Patient presents with  . Diabetes    3 m f/u   . Hypertension    8mf/u    Hypertension: BP Readings from Last 3 Encounters:  07/02/18 132/80  04/02/18 (!) 184/96  01/25/18 (!) 144/84   Lab Results  Component Value Date   CREATININE 0.96 04/02/2018  She takes amlodipine, clonidine, losartan, Toprol.  Meds adjusted last visit with additional 0.1 mg of clonidine for total dose of 0.3 mg twice per day. No new side effects at this dose.   Diabetes:  Lab Results  Component Value Date   HGBA1C 7.3 (H) 04/02/2018   Wt Readings from Last 3 Encounters:  07/02/18 202 lb 12.8 oz (92 kg)  04/02/18 200 lb 9.6 oz (91 kg)  01/25/18 200 lb (90.7 kg)  Urine microalbumin 12/21/17, reading of 91.  eye exam 04/18/2018 Diabetic Foot Exam - Simple   Simple Foot Form Diabetic Foot exam was performed with the following findings:  Yes 07/02/2018  2:00 PM  Visual Inspection No deformities, no ulcerations, no other skin breakdown bilaterally:  Yes Sensation Testing Intact to touch and monofilament testing bilaterally:  Yes Pulse Check Posterior Tibialis and Dorsalis pulse intact bilaterally:  Yes Comments Both feet a bit swollen. Pt reports its due to standing all day      A1c had improved from 7.6-7.3 last visit.  Had discussed replacing the Januvia with SGLT2 or GLP-1 as still above goal of 7.0.  She has remained on Glucotrol, metformin, Januvia. Has seen nephrology for proteinuria.  Home readings 120-180  No symptomatic lows.  Exercise - elliptical or treadmill 2-3 days per week, [facility] [employer] Prefers walking.   Hypercalcemia: See prior results, slightly elevated PTH and calcium when checked 2 months ago.  Asked that she follow back up with rheumatologist where  we referred in 2016 to see if other changes or work-up are needed. Saw rheumatologist, Dr. BChalmers Cater Advised to cut back on dairy products, and started on vitamin D supplement. Plans on 3-6 month follow up.  Lab Results  Component Value Date   PTH 68 (H) 04/26/2018   PTH Comment 04/26/2018   CALCIUM 11.0 (H) 04/26/2018      Patient Active Problem List   Diagnosis Date Noted  . Hyperlipidemia 12/21/2017  . Breast asymmetry 05/01/2015  . Cataract, nuclear 05/24/2012  . HTN (hypertension) 02/10/2012  . DM2 (diabetes mellitus, type 2) ([facility] 02/10/2012   Past Medical History:  Diagnosis Date  . Diabetes mellitus without complication ([place]   . Hypertension    No past surgical history on file. Allergies  Allergen Reactions  . Ace Inhibitors Cough   Prior to Admission medications   Medication Sig Start Date End Date Taking? Authorizing Provider  acetaminophen (TYLENOL) 500 MG tablet Take 500 mg by mouth every 6 (six) hours as needed for mild pain.   Yes [provider]  allopurinol (ZYLOPRIM) 100 MG tablet Take 2 tablets (200 mg total) by mouth daily. 04/02/18  Yes GWendie Agreste MD  amLODipine (NORVASC) 10 MG tablet Take 1 tablet (10 mg total) by mouth daily. 04/02/18  Yes GWendie Agreste MD  aspirin 81 MG tablet Take 81 mg by mouth daily.   Yes [provider]  atorvastatin (LIPITOR) 20 MG tablet Take 1 tablet (20 mg total) by mouth daily at 6 PM. 04/02/18  Yes [name], MD  Blood Glucose Monitoring Suppl (LDR BLOOD GLUCOSE TRUETEST) w/Device KIT 1 Device by Does not apply route daily. Test blood sugar once daily. Dx: E11.9 01/24/18  Yes [name], MD  cetirizine (ZYRTEC) 10 MG tablet Take 1 tablet (10 mg total) by mouth daily. 09/21/15  Yes [name], MD  cloNIDine (CATAPRES) 0.1 MG tablet Take 1 tablet (0.1 mg total) by mouth 2 (two) times daily. 04/02/18  Yes [name], MD  cloNIDine (CATAPRES) 0.2 MG tablet TAKE 1 TABLET (0.2 MG  TOTAL) BY MOUTH 2 TIMES DAILY 04/02/18  Yes [name], MD  colchicine 0.6 MG tablet 2 tablets once, then 1 tablet in 1 hour if needed for pain. No further doses per attack. Patient taking differently: Take 0.6 mg by mouth daily as needed (gout attack). 2 tablets once, then 1 tablet in 1 hour if needed for pain. No further doses per attack. 01/12/17  Yes [name], MD  doxycycline (VIBRA-TABS) 100 MG tablet Take 1 tablet (100 mg total) by mouth 2 (two) times daily. 04/02/18  Yes [name], MD  fluticasone (FLONASE) 50 MCG/ACT nasal spray Place 1-2 sprays into both nostrils daily. 04/02/18  Yes [name], MD  glipiZIDE (GLUCOTROL) 10 MG tablet TAKE 2 TABLETS (20 MG TOTAL) BY MOUTH 2 (TWO) TIMES DAILY BEFORE A MEAL. 04/02/18  Yes [name], MD  glucose blood (ACCU-CHEK AVIVA PLUS) test strip USE TO TEST BLOOD SUGAR ONCE DAILY. DX E11.9 01/24/18  Yes [name], MD  HYDROcodone-acetaminophen (NORCO) 5-325 MG tablet Take 1 tablet by mouth every 6 (six) hours as needed for moderate pain. 01/12/17  Yes [name], MD  Lancets MISC Test blood sugar once daily. Dx: E11.9 01/24/18  Yes [name], MD  losartan (COZAAR) 100 MG tablet Take 1 tablet (100 mg total) by mouth daily. 04/02/18  Yes [name], MD  metFORMIN (GLUCOPHAGE) 1000 MG tablet Take 1 tablet (1,000 mg total) by mouth 2 (two) times daily with a meal. 04/02/18  Yes [name], MD  metoprolol (TOPROL-XL) 200 MG 24 hr tablet TAKE 1 TABLET (200 MG TOTAL) BY MOUTH DAILY. 04/02/18  Yes [name], MD  ondansetron (ZOFRAN ODT) 8 MG disintegrating tablet Take 1 tablet (8 mg total) by mouth every 8 (eight) hours as needed for nausea or vomiting. 11/21/17  Yes Jola Schmidt, MD  sitaGLIPtin (JANUVIA) 100 MG tablet Take 1 tablet (100 mg total) by mouth daily. 04/02/18  Yes [name], MD   Social History   Socioeconomic History  . Marital status: Married    Spouse name: Not on  file  . Number of children: Not on file  . Years of education: Not on file  . Highest education level: Not on file  Occupational History  . Not on file  Social Needs  . Financial resource strain: Not on file  . Food insecurity:    Worry: Not on file    Inability: Not on file  . Transportation needs:    Medical: Not on file    Non-medical: Not on file  Tobacco Use  . Smoking status: Never Smoker  . Smokeless tobacco: Never Used  Substance and Sexual Activity  . Alcohol use: No  . Drug use: No  . Sexual activity: Not on file  Lifestyle  . Physical activity:    Days per week: Not on file    Minutes per session: Not on file  . Stress: Not on file  Relationships  . Social connections:    Talks on phone: Not on file    Gets together: Not on file    Attends religious service: Not on file    Active member of club or organization: Not on file    Attends meetings of clubs or organizations: Not on file    Relationship status: Not on file  . Intimate partner violence:    Fear of current or ex partner: Not on file    Emotionally abused: Not on file    Physically abused: Not on file    Forced sexual activity: Not on file  Other Topics Concern  . Not on file  Social History Narrative  . Not on file      Review of Systems  Constitutional: Negative for fatigue and unexpected weight change.  Respiratory: Negative for chest tightness and shortness of breath.   Cardiovascular: Negative for chest pain, palpitations and leg swelling.  Gastrointestinal: Negative for abdominal pain and blood in stool.  Neurological: Negative for dizziness, syncope, light-headedness and headaches.       Objective:   Physical Exam  Constitutional: She is oriented to person, place, and time. She appears well-developed and well-nourished.  HENT:  Head: Normocephalic and atraumatic.  Eyes: Pupils are equal, round, and reactive to light. Conjunctivae and EOM are normal.  Neck: Carotid bruit is not  present.  Cardiovascular: Normal rate, regular rhythm, normal heart sounds and intact distal pulses.  Pulmonary/Chest: Effort normal and breath sounds normal.  Abdominal: Soft. She exhibits no pulsatile midline mass. There is no tenderness.  Neurological: She is alert and oriented to person, place, and time.  Skin: Skin is warm and dry.  Psychiatric: She has a normal mood and affect. Her behavior is normal.  Vitals reviewed.  Vitals:   07/02/18 1357 07/02/18 1425  BP: (!) 148/74 132/80  Pulse: 67   Temp: 99 F (37.2 C)   TempSrc: Oral   SpO2: 97%   Weight: 202 lb 12.8 oz (92 kg)   Height: '5\' 3"'  (1.6 m)       Assessment & Plan:  Susan Miles is a 72 y.o. female Type 2 diabetes mellitus with microalbuminuria, without long-term current use of insulin (HCC) - Plan: Hemoglobin A1c  -Tolerating current regimen, would still consider GLP-1 or SGLT2 class if persistent elevation in A1c.  Continue same regimen for now, discussed exercise most days per week, and continued monitoring of diet  Essential hypertension  -Improved on current regimen including higher dose of clonidine which has been tolerated well.  No changes at this time.  No orders of the defined types were placed in this encounter.  Patient Instructions   Depending on your A1c  today, I would recommend changing the Januvia to different class (GLP-1) injections, or SGLT2 pills.   Some form of exercise most days per week.   No change in blood pressure meds for now.   Thanks for coming in today.     IF you received an x-ray today, you will receive an invoice from [facility]. Please contact [facility] at [phone] with questions or concerns regarding your invoice.   IF you received labwork today, you will receive an invoice from [facility]. Please contact [facility] at [phone] with questions or concerns regarding your invoice.   Our billing staff  will not be able to assist you with questions  regarding bills from these companies.  You will be contacted with the lab results as soon as they are available. The fastest way to get your results is to activate your My Chart account. Instructions are located on the last page of this paperwork. If you have not heard from [place] regarding the results in 2 weeks, please contact this office.       Signed,   [name], MD [facility].  07/04/18 3:54 PM

## 2018-07-03 LAB — HEMOGLOBIN A1C
Est. average glucose Bld gHb Est-mCnc: 169 mg/dL
Hgb A1c MFr Bld: 7.5 % — ABNORMAL HIGH (ref 4.8–5.6)

## 2018-07-17 ENCOUNTER — Encounter: Payer: Self-pay | Admitting: Radiology

## 2018-07-26 ENCOUNTER — Telehealth: Payer: Self-pay | Admitting: Family Medicine

## 2018-07-26 NOTE — Telephone Encounter (Signed)
Copied from [place] [id number]. Topic: General - Other >> Jul 26, 2018  2:48 PM Alfredia Ferguson R wrote: Pt called in referring to the letter that was sent out regarding meds. She states she can only afford SGLT2 pill everything else is out of her range.  Cb# [phone]

## 2018-07-27 NOTE — Telephone Encounter (Signed)
Please advise 

## 2018-07-30 NOTE — Telephone Encounter (Signed)
Left message to call back  

## 2018-07-30 NOTE — Telephone Encounter (Signed)
We could try [place] 5mg  qd, can provide coupon card, but not sure if that will save anything for her. We would be stopping the Januvia if that helps. Let me know if Wilder Glade is ok for cost. Thanks.

## 2018-07-31 NOTE — Telephone Encounter (Signed)
Dr. Carlota Raspberry,     Harmonie stated she would like to try the [place],  She just picked up a 30 day supply of Januvia and will like to finish that then start.   She was given instructions on how to print out free coupon, if she can't get it she will come in for a coupon.

## 2018-08-01 MED ORDER — DAPAGLIFLOZIN PROPANEDIOL 5 MG PO TABS: 5.0000 mg | ORAL_TABLET | Freq: Every day | ORAL | 1 refills | Status: DC

## 2018-08-01 NOTE — Addendum Note (Signed)
Addended by: [name] R on: 08/01/2018 09:37 AM   Modules accepted: Orders

## 2018-08-01 NOTE — Telephone Encounter (Signed)
farxiga rx sent in.  Can start after finishing Januvia. Follow up as scheduled.  Let me know if there are questions.

## 2018-08-09 DIAGNOSIS — R809 Proteinuria, unspecified: Secondary | ICD-10-CM | POA: Diagnosis not present

## 2018-08-09 DIAGNOSIS — I129 Hypertensive chronic kidney disease with stage 1 through stage 4 chronic kidney disease, or unspecified chronic kidney disease: Secondary | ICD-10-CM | POA: Diagnosis not present

## 2018-08-09 DIAGNOSIS — N182 Chronic kidney disease, stage 2 (mild): Secondary | ICD-10-CM | POA: Diagnosis not present

## 2018-08-14 DIAGNOSIS — N182 Chronic kidney disease, stage 2 (mild): Secondary | ICD-10-CM | POA: Diagnosis not present

## 2018-08-24 ENCOUNTER — Telehealth: Payer: Self-pay | Admitting: Family Medicine

## 2018-08-24 NOTE — Telephone Encounter (Signed)
Copied from [place] [id number]. Topic: General - Other >> Aug 24, 2018  3:12 PM Leward Quan A wrote: Reason for CRM: Patient called to say that she will be done with Januvia on 08/27/18 so please send Rx to pharmacy for new medication discussed at last visit. Patient did not remember the name of the medicine

## 2018-08-27 NOTE — Telephone Encounter (Signed)
Susan Miles is cheaper than Januvia.  Purchased today, and feels like this will be least costly of SGLT and GLP-1.  She will start [place] for now.

## 2018-09-18 ENCOUNTER — Other Ambulatory Visit: Payer: Self-pay | Admitting: Family Medicine

## 2018-10-08 ENCOUNTER — Ambulatory Visit (INDEPENDENT_AMBULATORY_CARE_PROVIDER_SITE_OTHER): Payer: Medicare HMO | Admitting: Family Medicine

## 2018-10-08 ENCOUNTER — Encounter: Payer: Self-pay | Admitting: Family Medicine

## 2018-10-08 ENCOUNTER — Other Ambulatory Visit: Payer: Self-pay

## 2018-10-08 VITALS — BP 126/70 | HR 74 | Temp 98.4°F | Ht 63.0 in | Wt 197.2 lb

## 2018-10-08 DIAGNOSIS — E785 Hyperlipidemia, unspecified: Secondary | ICD-10-CM

## 2018-10-08 DIAGNOSIS — R809 Proteinuria, unspecified: Secondary | ICD-10-CM | POA: Diagnosis not present

## 2018-10-08 DIAGNOSIS — E1129 Type 2 diabetes mellitus with other diabetic kidney complication: Secondary | ICD-10-CM | POA: Diagnosis not present

## 2018-10-08 DIAGNOSIS — E782 Mixed hyperlipidemia: Secondary | ICD-10-CM | POA: Diagnosis not present

## 2018-10-08 DIAGNOSIS — E119 Type 2 diabetes mellitus without complications: Secondary | ICD-10-CM

## 2018-10-08 DIAGNOSIS — I10 Essential (primary) hypertension: Secondary | ICD-10-CM

## 2018-10-08 DIAGNOSIS — M109 Gout, unspecified: Secondary | ICD-10-CM

## 2018-10-08 MED ORDER — CLONIDINE HCL 0.1 MG PO TABS: 0.1000 mg | ORAL_TABLET | Freq: Two times a day (BID) | ORAL | 1 refills | Status: DC

## 2018-10-08 MED ORDER — GLIPIZIDE 10 MG PO TABS: ORAL_TABLET | ORAL | 1 refills | Status: DC

## 2018-10-08 MED ORDER — METFORMIN HCL 1000 MG PO TABS: 1000.0000 mg | ORAL_TABLET | Freq: Two times a day (BID) | ORAL | 1 refills | Status: DC

## 2018-10-08 MED ORDER — METOPROLOL SUCCINATE ER 200 MG PO TB24: ORAL_TABLET | ORAL | 1 refills | Status: DC

## 2018-10-08 MED ORDER — AMLODIPINE BESYLATE 10 MG PO TABS: 10.0000 mg | ORAL_TABLET | Freq: Every day | ORAL | 1 refills | Status: DC

## 2018-10-08 MED ORDER — DAPAGLIFLOZIN PROPANEDIOL 5 MG PO TABS: 5.0000 mg | ORAL_TABLET | Freq: Every day | ORAL | 1 refills | Status: DC

## 2018-10-08 MED ORDER — CLONIDINE HCL 0.2 MG PO TABS: ORAL_TABLET | ORAL | 1 refills | Status: DC

## 2018-10-08 MED ORDER — ATORVASTATIN CALCIUM 20 MG PO TABS: 20.0000 mg | ORAL_TABLET | Freq: Every day | ORAL | 1 refills | Status: DC

## 2018-10-08 MED ORDER — LOSARTAN POTASSIUM 100 MG PO TABS: 100.0000 mg | ORAL_TABLET | Freq: Every day | ORAL | 1 refills | Status: DC

## 2018-10-08 MED ORDER — ALLOPURINOL 100 MG PO TABS: 200.0000 mg | ORAL_TABLET | Freq: Every day | ORAL | 1 refills | Status: DC

## 2018-10-08 NOTE — Progress Notes (Signed)
Subjective:  By signing my name below, I, Moises Blood, attest that this documentation has been prepared under the direction and in the presence of [name], MD. Electronically Signed: Moises Blood, [place]. 10/08/2018 , 2:57 PM .  Patient was seen in Room 14 .   Patient ID: Susan Miles, female    DOB: Nov 30, 1946, 72 y.o.   MRN: [id number] Chief Complaint  Patient presents with  . Diabetes    3 m F/U    HPI Susan Miles is a 72 y.o. female Here for follow up. Last seen in July. She had coffee and juice with her medications at 8:00 AM.   HTN BP Readings from Last 3 Encounters:  10/08/18 126/70  07/02/18 132/80  04/02/18 (!) 184/96   Lab Results  Component Value Date   CREATININE 0.96 04/02/2018   She was tolerating regime at last visit, continued at same dose. She mentions when she checks her BP when waking up in the morning, it's elevated at 150s/160s/90s. But, it normalizes after she takes her BP medications.   She's been taking her clonidine 0.3 mg qd in the morning, and Losartan and Toprol in the evening. She mentions pharmacist with [facility] discussed regime with her.   Gout She denies any gout flares in over a year. Her uric acid in Jan 2019 was 5.1. She takes allopurinol 200 mg qd; denies any side effects.   Lab Results  Component Value Date   LABURIC 5.1 12/21/2017    Hyperlipidemia Lab Results  Component Value Date   CHOL 161 12/21/2017   HDL 48 12/21/2017   LDLCALC 91 12/21/2017   TRIG 108 12/21/2017   CHOLHDL 3.4 12/21/2017   Lab Results  Component Value Date   ALT 12 12/21/2017   AST 16 12/21/2017   ALKPHOS 79 12/21/2017   BILITOT 0.4 12/21/2017   She was continued on Lipitor 20 mg qd.   Diabetes - complicated with CKD Lab Results  Component Value Date   HGBA1C 7.5 (H) 07/02/2018   Wt Readings from Last 3 Encounters:  10/08/18 197 lb 3.2 oz (89.4 kg)  07/02/18 202 lb 12.8 oz (92 kg)  04/02/18 200 lb 9.6 oz (91 kg)   She was  continued on metformin 1000 mg bid. Januvia was changed to Farxiga 5 mg qd. She was continued on glipizide 10 mg 2 tablets bid with meals.   She states she's doing well on the new regime. She denies any side effects with [place]. She's followed by nephrologist, Dr. Carolin Sicks, for complicated diabetes with CKD and proteinuria. Plan to avoid NSAIDs and nephro-toxins, goal of A1C < 7.0, and controlled BP. No medication changes for BP. She exercises about 3-4 times a week. She denies any yeast or new discharge after starting [place].    Patient Active Problem List   Diagnosis Date Noted  . Hyperlipidemia 12/21/2017  . Breast asymmetry 05/01/2015  . Cataract, nuclear 05/24/2012  . HTN (hypertension) 02/10/2012  . DM2 (diabetes mellitus, type 2) ([place]) 02/10/2012   Past Medical History:  Diagnosis Date  . Diabetes mellitus without complication ([place])   . Hypertension    No past surgical history on file. Allergies  Allergen Reactions  . Ace Inhibitors Cough   Prior to Admission medications   Medication Sig Start Date End Date Taking? Authorizing Provider  acetaminophen (TYLENOL) 500 MG tablet Take 500 mg by mouth every 6 (six) hours as needed for mild pain.    [provider]  allopurinol (ZYLOPRIM)  100 MG tablet Take 2 tablets (200 mg total) by mouth daily. 04/02/18   [name], MD  amLODipine (NORVASC) 10 MG tablet Take 1 tablet (10 mg total) by mouth daily. 04/02/18   [name], MD  aspirin 81 MG tablet Take 81 mg by mouth daily.    [provider]  atorvastatin (LIPITOR) 20 MG tablet Take 1 tablet (20 mg total) by mouth daily at 6 PM. 04/02/18   [name], MD  Blood Glucose Monitoring Suppl (LDR BLOOD GLUCOSE TRUETEST) w/Device KIT 1 Device by Does not apply route daily. Test blood sugar once daily. Dx: E11.9 01/24/18   [name], MD  cetirizine (ZYRTEC) 10 MG tablet Take 1 tablet (10 mg total) by mouth daily. 09/21/15   [name], MD    cloNIDine (CATAPRES) 0.1 MG tablet TAKE 1 TABLET (0.1 MG TOTAL) BY MOUTH 2 (TWO) TIMES DAILY. 09/18/18   [name], MD  cloNIDine (CATAPRES) 0.2 MG tablet TAKE 1 TABLET (0.2 MG TOTAL) BY MOUTH 2 TIMES DAILY 04/02/18   [name], MD  colchicine 0.6 MG tablet 2 tablets once, then 1 tablet in 1 hour if needed for pain. No further doses per attack. Patient taking differently: Take 0.6 mg by mouth daily as needed (gout attack). 2 tablets once, then 1 tablet in 1 hour if needed for pain. No further doses per attack. 01/12/17   [name], MD  dapagliflozin propanediol (FARXIGA) 5 MG TABS tablet Take 5 mg by mouth daily. 08/01/18   [name], MD  doxycycline (VIBRA-TABS) 100 MG tablet Take 1 tablet (100 mg total) by mouth 2 (two) times daily. 04/02/18   [name], MD  fluticasone (FLONASE) 50 MCG/ACT nasal spray Place 1-2 sprays into both nostrils daily. 04/02/18   [name], MD  glipiZIDE (GLUCOTROL) 10 MG tablet TAKE 2 TABLETS (20 MG TOTAL) BY MOUTH 2 (TWO) TIMES DAILY BEFORE A MEAL. 04/02/18   [name], MD  glucose blood (ACCU-CHEK AVIVA PLUS) test strip USE TO TEST BLOOD SUGAR ONCE DAILY. DX E11.9 01/24/18   [name], MD  HYDROcodone-acetaminophen (NORCO) 5-325 MG tablet Take 1 tablet by mouth every 6 (six) hours as needed for moderate pain. 01/12/17   [name], MD  Lancets MISC Test blood sugar once daily. Dx: E11.9 01/24/18   [name], MD  losartan (COZAAR) 100 MG tablet Take 1 tablet (100 mg total) by mouth daily. 04/02/18   [name], MD  metFORMIN (GLUCOPHAGE) 1000 MG tablet Take 1 tablet (1,000 mg total) by mouth 2 (two) times daily with a meal. 04/02/18   [name], MD  metoprolol (TOPROL-XL) 200 MG 24 hr tablet TAKE 1 TABLET (200 MG TOTAL) BY MOUTH DAILY. 04/02/18   [name], MD  ondansetron (ZOFRAN ODT) 8 MG disintegrating tablet Take 1 tablet (8 mg total) by mouth every 8 (eight) hours as needed for  nausea or vomiting. 11/21/17   Jola Schmidt, MD  sitaGLIPtin (JANUVIA) 100 MG tablet Take 1 tablet (100 mg total) by mouth daily. 04/02/18   [name], MD   Social History   Socioeconomic History  . Marital status: Married    Spouse name: Not on file  . Number of children: Not on file  . Years of education: Not on file  . Highest education level: Not on file  Occupational History  . Not on file  Social Needs  .  Financial resource strain: Not on file  . Food insecurity:    Worry: Not on file    Inability: Not on file  . Transportation needs:    Medical: Not on file    Non-medical: Not on file  Tobacco Use  . Smoking status: Never Smoker  . Smokeless tobacco: Never Used  Substance and Sexual Activity  . Alcohol use: No  . Drug use: No  . Sexual activity: Not on file  Lifestyle  . Physical activity:    Days per week: Not on file    Minutes per session: Not on file  . Stress: Not on file  Relationships  . Social connections:    Talks on phone: Not on file    Gets together: Not on file    Attends religious service: Not on file    Active member of club or organization: Not on file    Attends meetings of clubs or organizations: Not on file    Relationship status: Not on file  . Intimate partner violence:    Fear of current or ex partner: Not on file    Emotionally abused: Not on file    Physically abused: Not on file    Forced sexual activity: Not on file  Other Topics Concern  . Not on file  Social History Narrative  . Not on file   Review of Systems  Constitutional: Negative for fatigue and unexpected weight change.  Respiratory: Negative for chest tightness and shortness of breath.   Cardiovascular: Negative for chest pain, palpitations and leg swelling.  Gastrointestinal: Negative for abdominal pain and blood in stool.  Neurological: Negative for dizziness, syncope, light-headedness and headaches.       Objective:   Physical Exam  Constitutional: She  is oriented to person, place, and time. She appears well-developed and well-nourished.  HENT:  Head: Normocephalic and atraumatic.  Eyes: Pupils are equal, round, and reactive to light. Conjunctivae and EOM are normal.  Neck: Carotid bruit is not present.  Cardiovascular: Normal rate, regular rhythm, normal heart sounds and intact distal pulses.  Pulmonary/Chest: Effort normal and breath sounds normal.  Abdominal: Soft. She exhibits no pulsatile midline mass. There is no tenderness.  Neurological: She is alert and oriented to person, place, and time.  Skin: Skin is warm and dry.  Psychiatric: She has a normal mood and affect. Her behavior is normal.  Vitals reviewed.   Vitals:   10/08/18 1429 10/08/18 1431  BP: (!) 158/78 126/70  Pulse: 74   Temp: 98.4 F (36.9 C)   TempSrc: Oral   SpO2: 98%   Weight: 197 lb 3.2 oz (89.4 kg)   Height: 5' 3" (1.6 m)        Assessment & Plan:    Susan Miles is a 72 y.o. female Type 2 diabetes mellitus with microalbuminuria, without long-term current use of insulin ([place]) - Plan: Hemoglobin A1c, dapagliflozin propanediol (FARXIGA) 5 MG TABS tablet Type 2 diabetes mellitus without complication, without long-term current use of insulin ([facility]) - Plan: glipiZIDE (GLUCOTROL) 10 MG tablet, metFORMIN (GLUCOPHAGE) 1000 MG tablet  -  Stable, tolerating new regimen. Medications refilled. Labs pending as above.   Essential hypertension - Plan: Comprehensive metabolic panel, amLODipine (NORVASC) 10 MG tablet, cloNIDine (CATAPRES) 0.1 MG tablet, cloNIDine (CATAPRES) 0.2 MG tablet, losartan (COZAAR) 100 MG tablet, metoprolol (TOPROL-XL) 200 MG 24 hr tablet  -  Stable, tolerating current regimen. Medications refilled. Labs pending as above.   Hyperlipidemia, unspecified hyperlipidemia type - Plan:  Lipid panel, Comprehensive metabolic panel Mixed hyperlipidemia - Plan: atorvastatin (LIPITOR) 20 MG tablet   -  Stable, tolerating current regimen. Medications  refilled.   Gout, unspecified cause, unspecified chronicity, unspecified site - Plan: Uric Acid Gout of left foot, unspecified cause, unspecified chronicity - Plan: allopurinol (ZYLOPRIM) 100 MG tablet  -Stable without recent flares.  Depending on uric acid could consider low-dose allopurinol.  No changes at this time.   Meds ordered this encounter  Medications  . allopurinol (ZYLOPRIM) 100 MG tablet    Sig: Take 2 tablets (200 mg total) by mouth daily.    Dispense:  180 tablet    Refill:  1  . amLODipine (NORVASC) 10 MG tablet    Sig: Take 1 tablet (10 mg total) by mouth daily.    Dispense:  90 tablet    Refill:  1  . atorvastatin (LIPITOR) 20 MG tablet    Sig: Take 1 tablet (20 mg total) by mouth daily at 6 PM.    Dispense:  90 tablet    Refill:  1  . cloNIDine (CATAPRES) 0.1 MG tablet    Sig: Take 1 tablet (0.1 mg total) by mouth 2 (two) times daily.    Dispense:  180 tablet    Refill:  1  . cloNIDine (CATAPRES) 0.2 MG tablet    Sig: TAKE 1 TABLET (0.2 MG TOTAL) BY MOUTH 2 TIMES DAILY    Dispense:  180 tablet    Refill:  1  . dapagliflozin propanediol (FARXIGA) 5 MG TABS tablet    Sig: Take 5 mg by mouth daily.    Dispense:  90 tablet    Refill:  1  . glipiZIDE (GLUCOTROL) 10 MG tablet    Sig: TAKE 2 TABLETS (20 MG TOTAL) BY MOUTH 2 (TWO) TIMES DAILY BEFORE A MEAL.    Dispense:  360 tablet    Refill:  1  . losartan (COZAAR) 100 MG tablet    Sig: Take 1 tablet (100 mg total) by mouth daily.    Dispense:  90 tablet    Refill:  1  . metFORMIN (GLUCOPHAGE) 1000 MG tablet    Sig: Take 1 tablet (1,000 mg total) by mouth 2 (two) times daily with a meal.    Dispense:  180 tablet    Refill:  1  . metoprolol (TOPROL-XL) 200 MG 24 hr tablet    Sig: TAKE 1 TABLET (200 MG TOTAL) BY MOUTH DAILY.    Dispense:  90 tablet    Refill:  1   Patient Instructions   No change in blood pressure meds for now.   Depending on uric acid, may be able to decrease dose of allopurinol.    No other med changes for now, but I will let you know once labs are back.   Thanks for coming in today.    How to Take Your Blood Pressure You can take your blood pressure at home with a machine. You may need to check your blood pressure at home:  To check if you have high blood pressure (hypertension).  To check your blood pressure over time.  To make sure your blood pressure medicine is working.  Supplies needed: You will need a blood pressure machine, or monitor. You can buy one at a drugstore or online. When choosing one:  Choose one with an arm cuff.  Choose one that wraps around your upper arm. Only one finger should fit between your arm and the cuff.  Do not choose one  that measures your blood pressure from your wrist or finger.  Your doctor can suggest a monitor. How to prepare Avoid these things for 30 minutes before checking your blood pressure:  Drinking caffeine.  Drinking alcohol.  Eating.  Smoking.  Exercising.  Five minutes before checking your blood pressure:  Pee.  Sit in a dining chair. Avoid sitting in a soft couch or armchair.  Be quiet. Do not talk.  How to take your blood pressure Follow the instructions that came with your machine. If you have a digital blood pressure monitor, these may be the instructions: 1. Sit up straight. 2. Place your feet on the floor. Do not cross your ankles or legs. 3. Rest your left arm at the level of your heart. You may rest it on a table, desk, or chair. 4. Pull up your shirt sleeve. 5. Wrap the blood pressure cuff around the upper part of your left arm. The cuff should be 1 inch (2.5 cm) above your elbow. It is best to wrap the cuff around bare skin. 6. Fit the cuff snugly around your arm. You should be able to place only one finger between the cuff and your arm. 7. Put the cord inside the groove of your elbow. 8. Press the power button. 9. Sit quietly while the cuff fills with air and loses  air. 10. Write down the numbers on the screen. 11. Wait 2-3 minutes and then repeat steps 1-10.  What do the numbers mean? Two numbers make up your blood pressure. The first number is called systolic pressure. The second is called diastolic pressure. An example of a blood pressure reading is "120 over 80" (or 120/80). If you are an adult and do not have a medical condition, use this guide to find out if your blood pressure is normal: Normal  First number: below 120.  Second number: below 80. Elevated  First number: 120-129.  Second number: below 80. Hypertension stage 1  First number: 130-139.  Second number: 80-89. Hypertension stage 2  First number: 140 or above.  Second number: 6 or above. Your blood pressure is above normal even if only the top or bottom number is above normal. Follow these instructions at home:  Check your blood pressure as often as your doctor tells you to.  Take your monitor to your next doctor's appointment. Your doctor will: ? Make sure you are using it correctly. ? Make sure it is working right.  Make sure you understand what your blood pressure numbers should be.  Tell your doctor if your medicines are causing side effects. Contact a doctor if:  Your blood pressure keeps being high. Get help right away if:  Your first blood pressure number is higher than 180.  Your second blood pressure number is higher than 120. This information is not intended to replace advice given to you by your health care provider. Make sure you discuss any questions you have with your health care provider. Document Released: 11/03/2008 Document Revised: 10/19/2016 Document Reviewed: 04/29/2016 Elsevier Interactive Patient Education  [employer].    If you have lab work done today you will be contacted with your lab results within the next 2 weeks.  If you have not heard from [place] then please contact us. The fastest way to get your results is to register  for My Chart.   IF you received an x-ray today, you will receive an invoice from [facility]. Please contact [facility] at [phone] with questions or  concerns regarding your invoice.   IF you received labwork today, you will receive an invoice from [place]. Please contact [facility] at [phone] with questions or concerns regarding your invoice.   Our billing staff will not be able to assist you with questions regarding bills from these companies.  You will be contacted with the lab results as soon as they are available. The fastest way to get your results is to activate your My Chart account. Instructions are located on the last page of this paperwork. If you have not heard from [place] regarding the results in 2 weeks, please contact this office.       I personally performed the services described in this documentation, which was scribed in my presence. The recorded information has been reviewed and considered for accuracy and completeness, addended by me as needed, and agree with information above.  Signed,   [name], MD [facility] [place].  10/11/18 2:48 PM

## 2018-10-08 NOTE — Patient Instructions (Addendum)
No change in blood pressure meds for now.   Depending on uric acid, may be able to decrease dose of allopurinol.   No other med changes for now, but I will let you know once labs are back.   Thanks for coming in today.    How to Take Your Blood Pressure You can take your blood pressure at home with a machine. You may need to check your blood pressure at home:  To check if you have high blood pressure (hypertension).  To check your blood pressure over time.  To make sure your blood pressure medicine is working.  Supplies needed: You will need a blood pressure machine, or monitor. You can buy one at a drugstore or online. When choosing one:  Choose one with an arm cuff.  Choose one that wraps around your upper arm. Only one finger should fit between your arm and the cuff.  Do not choose one that measures your blood pressure from your wrist or finger.  Your doctor can suggest a monitor. How to prepare Avoid these things for 30 minutes before checking your blood pressure:  Drinking caffeine.  Drinking alcohol.  Eating.  Smoking.  Exercising.  Five minutes before checking your blood pressure:  Pee.  Sit in a dining chair. Avoid sitting in a soft couch or armchair.  Be quiet. Do not talk.  How to take your blood pressure Follow the instructions that came with your machine. If you have a digital blood pressure monitor, these may be the instructions: 1. Sit up straight. 2. Place your feet on the floor. Do not cross your ankles or legs. 3. Rest your left arm at the level of your heart. You may rest it on a table, desk, or chair. 4. Pull up your shirt sleeve. 5. Wrap the blood pressure cuff around the upper part of your left arm. The cuff should be 1 inch (2.5 cm) above your elbow. It is best to wrap the cuff around bare skin. 6. Fit the cuff snugly around your arm. You should be able to place only one finger between the cuff and your arm. 7. Put the cord inside the  groove of your elbow. 8. Press the power button. 9. Sit quietly while the cuff fills with air and loses air. 10. Write down the numbers on the screen. 11. Wait 2-3 minutes and then repeat steps 1-10.  What do the numbers mean? Two numbers make up your blood pressure. The first number is called systolic pressure. The second is called diastolic pressure. An example of a blood pressure reading is "120 over 80" (or 120/80). If you are an adult and do not have a medical condition, use this guide to find out if your blood pressure is normal: Normal  First number: below 120.  Second number: below 80. Elevated  First number: 120-129.  Second number: below 80. Hypertension stage 1  First number: 130-139.  Second number: 80-89. Hypertension stage 2  First number: 140 or above.  Second number: 32 or above. Your blood pressure is above normal even if only the top or bottom number is above normal. Follow these instructions at home:  Check your blood pressure as often as your doctor tells you to.  Take your monitor to your next doctor's appointment. Your doctor will: ? Make sure you are using it correctly. ? Make sure it is working right.  Make sure you understand what your blood pressure numbers should be.  Tell your doctor if your  medicines are causing side effects. Contact a doctor if:  Your blood pressure keeps being high. Get help right away if:  Your first blood pressure number is higher than 180.  Your second blood pressure number is higher than 120. This information is not intended to replace advice given to you by your health care provider. Make sure you discuss any questions you have with your health care provider. Document Released: 11/03/2008 Document Revised: 10/19/2016 Document Reviewed: 04/29/2016 Elsevier Interactive Patient Education  [employer].    If you have lab work done today you will be contacted with your lab results within the next 2 weeks.   If you have not heard from [place] then please contact us. The fastest way to get your results is to register for My Chart.   IF you received an x-ray today, you will receive an invoice from [facility]. Please contact [facility] at [phone] with questions or concerns regarding your invoice.   IF you received labwork today, you will receive an invoice from [place]. Please contact [facility] at [phone] with questions or concerns regarding your invoice.   Our billing staff will not be able to assist you with questions regarding bills from these companies.  You will be contacted with the lab results as soon as they are available. The fastest way to get your results is to activate your My Chart account. Instructions are located on the last page of this paperwork. If you have not heard from [place] regarding the results in 2 weeks, please contact this office.

## 2018-10-09 LAB — COMPREHENSIVE METABOLIC PANEL
ALT: 12 IU/L (ref 0–32)
AST: 9 IU/L (ref 0–40)
Albumin/Globulin Ratio: 1.4 (ref 1.2–2.2)
Albumin: 4.3 g/dL (ref 3.5–4.8)
Alkaline Phosphatase: 70 IU/L (ref 39–117)
BUN/Creatinine Ratio: 15 (ref 12–28)
BUN: 15 mg/dL (ref 8–27)
Bilirubin Total: 0.4 mg/dL (ref 0.0–1.2)
CO2: 19 mmol/L — AB (ref 20–29)
CREATININE: 1.03 mg/dL — AB (ref 0.57–1.00)
Calcium: 10.9 mg/dL — ABNORMAL HIGH (ref 8.7–10.3)
Chloride: 102 mmol/L (ref 96–106)
GFR calc Af Amer: 63 mL/min/{1.73_m2} (ref >59)
GFR calc non Af Amer: 55 mL/min/{1.73_m2} — ABNORMAL LOW (ref >59)
Globulin, Total: 3.1 g/dL (ref 1.5–4.5)
Glucose: 74 mg/dL (ref 65–99)
Potassium: 4 mmol/L (ref 3.5–5.2)
Sodium: 140 mmol/L (ref 134–144)
Total Protein: 7.4 g/dL (ref 6.0–8.5)

## 2018-10-09 LAB — URIC ACID: Uric Acid: 6 mg/dL (ref 2.5–7.1)

## 2018-10-09 LAB — LIPID PANEL
CHOL/HDL RATIO: 3.4 ratio (ref 0.0–4.4)
Cholesterol, Total: 141 mg/dL (ref 100–199)
HDL: 42 mg/dL (ref >39)
LDL CALC: 80 mg/dL (ref 0–99)
Triglycerides: 97 mg/dL (ref 0–149)
VLDL Cholesterol Cal: 19 mg/dL (ref 5–40)

## 2018-10-09 LAB — HEMOGLOBIN A1C
Est. average glucose Bld gHb Est-mCnc: 171 mg/dL
Hgb A1c MFr Bld: 7.6 % — ABNORMAL HIGH (ref 4.8–5.6)

## 2018-10-11 ENCOUNTER — Encounter: Payer: Self-pay | Admitting: Family Medicine

## 2018-10-15 ENCOUNTER — Encounter: Payer: Self-pay | Admitting: Family Medicine

## 2018-11-05 DIAGNOSIS — N182 Chronic kidney disease, stage 2 (mild): Secondary | ICD-10-CM | POA: Diagnosis not present

## 2018-11-19 DIAGNOSIS — M109 Gout, unspecified: Secondary | ICD-10-CM | POA: Diagnosis not present

## 2018-11-19 DIAGNOSIS — I129 Hypertensive chronic kidney disease with stage 1 through stage 4 chronic kidney disease, or unspecified chronic kidney disease: Secondary | ICD-10-CM | POA: Diagnosis not present

## 2018-11-19 DIAGNOSIS — N182 Chronic kidney disease, stage 2 (mild): Secondary | ICD-10-CM | POA: Diagnosis not present

## 2018-11-19 DIAGNOSIS — R809 Proteinuria, unspecified: Secondary | ICD-10-CM | POA: Diagnosis not present

## 2019-01-14 ENCOUNTER — Ambulatory Visit: Payer: Medicare HMO | Admitting: Family Medicine

## 2019-02-07 ENCOUNTER — Ambulatory Visit (INDEPENDENT_AMBULATORY_CARE_PROVIDER_SITE_OTHER): Payer: Medicare HMO | Admitting: Family Medicine

## 2019-02-07 ENCOUNTER — Encounter: Payer: Self-pay | Admitting: Family Medicine

## 2019-02-07 ENCOUNTER — Other Ambulatory Visit: Payer: Self-pay

## 2019-02-07 VITALS — BP 134/74 | HR 69 | Temp 98.8°F | Resp 18 | Ht 63.0 in | Wt 194.8 lb

## 2019-02-07 DIAGNOSIS — R809 Proteinuria, unspecified: Secondary | ICD-10-CM | POA: Diagnosis not present

## 2019-02-07 DIAGNOSIS — E1129 Type 2 diabetes mellitus with other diabetic kidney complication: Secondary | ICD-10-CM | POA: Diagnosis not present

## 2019-02-07 LAB — POCT GLYCOSYLATED HEMOGLOBIN (HGB A1C): HEMOGLOBIN A1C: 7.7 % — AB (ref 4.0–5.6)

## 2019-02-07 MED ORDER — DAPAGLIFLOZIN PROPANEDIOL 10 MG PO TABS: 10.0000 mg | ORAL_TABLET | Freq: Every day | ORAL | 1 refills | Status: DC

## 2019-02-07 NOTE — Patient Instructions (Addendum)
Try higher dose of Farxiga at 10 mg, continue glipizide and metformin same doses.  Continue the good work with diet and exercise as weight has gone down.  Recheck in 3 months.  Let me know if any refills needed before that visit.  Thank you for coming in today!  If you have lab work done today you will be contacted with your lab results within the next 2 weeks.  If you have not heard from [place] then please contact us. The fastest way to get your results is to register for My Chart.   IF you received an x-ray today, you will receive an invoice from [facility]. Please contact [facility] at [phone] with questions or concerns regarding your invoice.   IF you received labwork today, you will receive an invoice from [place]. Please contact [facility] at [phone] with questions or concerns regarding your invoice.   Our billing staff will not be able to assist you with questions regarding bills from these companies.  You will be contacted with the lab results as soon as they are available. The fastest way to get your results is to activate your My Chart account. Instructions are located on the last page of this paperwork. If you have not heard from [place] regarding the results in 2 weeks, please contact this office.

## 2019-02-07 NOTE — Progress Notes (Signed)
Subjective:    Patient ID: Susan Miles, female    DOB: 1945-12-24, 73 y.o.   MRN: [id number]  HPI Susan Miles is a 73 y.o. female Presents today for: Chief Complaint  Patient presents with  . Diabetes    follow up    Diabetes: Borderline control previously.  Complicated by microalbuminuria.  had discussed additional options.  Ultimately decided to work on diet/exercise and continue same regimen last visit, but did recommend trying to double the [place] to 10 mg/day. Currently on Farxiga 5 mg daily - had just started it - wanted to remain on that dose. No new side effects. No new mycotic infections. Some frequency. Still on glipizide 10 mg twice daily, metformin 1000 mg twice daily. Exercising.  Home readings improved - 120-140's.  No symptomatic lows.  Microalbumin: ordering today.  Optho, foot exam, pneumovax: up to date.   farxiga still costly, but less now than at end of year.   Lab Results  Component Value Date   HGBA1C 7.7 (A) 02/07/2019   HGBA1C 7.6 (H) 10/08/2018   HGBA1C 7.5 (H) 07/02/2018   Lab Results  Component Value Date   MICROALBUR 7.5 03/23/2016   LDLCALC 80 10/08/2018   CREATININE 1.03 (H) 10/08/2018   Wt Readings from Last 3 Encounters:  02/07/19 194 lb 12.8 oz (88.4 kg)  10/08/18 197 lb 3.2 oz (89.4 kg)  07/02/18 202 lb 12.8 oz (92 kg)      Patient Active Problem List   Diagnosis Date Noted  . Hyperlipidemia 12/21/2017  . Breast asymmetry 05/01/2015  . Cataract, nuclear 05/24/2012  . HTN (hypertension) 02/10/2012  . DM2 (diabetes mellitus, type 2) ([place]) 02/10/2012   Past Medical History:  Diagnosis Date  . Diabetes mellitus without complication ([place])   . Hypertension    No past surgical history on file. Allergies  Allergen Reactions  . Ace Inhibitors Cough   Prior to Admission medications   Medication Sig Start Date End Date Taking? Authorizing Provider  acetaminophen (TYLENOL) 500 MG tablet Take 500 mg by mouth every 6 (six)  hours as needed for mild pain.   Yes [provider]  allopurinol (ZYLOPRIM) 100 MG tablet Take 2 tablets (200 mg total) by mouth daily. 10/08/18  Yes [name], MD  amLODipine (NORVASC) 10 MG tablet Take 1 tablet (10 mg total) by mouth daily. 10/08/18  Yes [name], MD  aspirin 81 MG tablet Take 81 mg by mouth daily.   Yes [provider]  atorvastatin (LIPITOR) 20 MG tablet Take 1 tablet (20 mg total) by mouth daily at 6 PM. 10/08/18  Yes [name], MD  Blood Glucose Monitoring Suppl (LDR BLOOD GLUCOSE TRUETEST) w/Device KIT 1 Device by Does not apply route daily. Test blood sugar once daily. Dx: E11.9 01/24/18  Yes [name], MD  cetirizine (ZYRTEC) 10 MG tablet Take 1 tablet (10 mg total) by mouth daily. 09/21/15  Yes [name], MD  cloNIDine (CATAPRES) 0.1 MG tablet Take 1 tablet (0.1 mg total) by mouth 2 (two) times daily. 10/08/18  Yes [name], MD  cloNIDine (CATAPRES) 0.2 MG tablet TAKE 1 TABLET (0.2 MG TOTAL) BY MOUTH 2 TIMES DAILY 10/08/18  Yes [name], MD  colchicine 0.6 MG tablet 2 tablets once, then 1 tablet in 1 hour if needed for pain. No further doses per attack. Patient taking differently: Take 0.6 mg by mouth daily as needed (gout attack). 2 tablets once, then  1 tablet in 1 hour if needed for pain. No further doses per attack. 01/12/17  Yes [name], MD  dapagliflozin propanediol (FARXIGA) 5 MG TABS tablet Take 5 mg by mouth daily. 10/08/18  Yes [name], MD  fluticasone (FLONASE) 50 MCG/ACT nasal spray Place 1-2 sprays into both nostrils daily. 04/02/18  Yes [name], MD  glipiZIDE (GLUCOTROL) 10 MG tablet TAKE 2 TABLETS (20 MG TOTAL) BY MOUTH 2 (TWO) TIMES DAILY BEFORE A MEAL. 10/08/18  Yes [name], MD  glucose blood (ACCU-CHEK AVIVA PLUS) test strip USE TO TEST BLOOD SUGAR ONCE DAILY. DX E11.9 01/24/18  Yes [name], MD  HYDROcodone-acetaminophen (NORCO) 5-325 MG tablet  Take 1 tablet by mouth every 6 (six) hours as needed for moderate pain. 01/12/17  Yes [name], MD  Lancets MISC Test blood sugar once daily. Dx: E11.9 01/24/18  Yes [name], MD  losartan (COZAAR) 100 MG tablet Take 1 tablet (100 mg total) by mouth daily. 10/08/18  Yes [name], MD  metFORMIN (GLUCOPHAGE) 1000 MG tablet Take 1 tablet (1,000 mg total) by mouth 2 (two) times daily with a meal. 10/08/18  Yes [name], MD  metoprolol (TOPROL-XL) 200 MG 24 hr tablet TAKE 1 TABLET (200 MG TOTAL) BY MOUTH DAILY. 10/08/18  Yes [name], MD  ondansetron (ZOFRAN ODT) 8 MG disintegrating tablet Take 1 tablet (8 mg total) by mouth every 8 (eight) hours as needed for nausea or vomiting. 11/21/17  Yes Jola Schmidt, MD   Social History   Socioeconomic History  . Marital status: Married    Spouse name: Not on file  . Number of children: Not on file  . Years of education: Not on file  . Highest education level: Not on file  Occupational History  . Not on file  Social Needs  . Financial resource strain: Not on file  . Food insecurity:    Worry: Not on file    Inability: Not on file  . Transportation needs:    Medical: Not on file    Non-medical: Not on file  Tobacco Use  . Smoking status: Never Smoker  . Smokeless tobacco: Never Used  Substance and Sexual Activity  . Alcohol use: No  . Drug use: No  . Sexual activity: Not on file  Lifestyle  . Physical activity:    Days per week: Not on file    Minutes per session: Not on file  . Stress: Not on file  Relationships  . Social connections:    Talks on phone: Not on file    Gets together: Not on file    Attends religious service: Not on file    Active member of club or organization: Not on file    Attends meetings of clubs or organizations: Not on file    Relationship status: Not on file  . Intimate partner violence:    Fear of current or ex partner: Not on file    Emotionally abused: Not on file     Physically abused: Not on file    Forced sexual activity: Not on file  Other Topics Concern  . Not on file  Social History Narrative  . Not on file    Review of Systems  Constitutional: Negative for fatigue and unexpected weight change.  Respiratory: Negative for chest tightness and shortness of breath.   Cardiovascular: Negative for chest pain, palpitations and leg swelling.  Gastrointestinal: Negative for abdominal pain  and blood in stool.  Neurological: Negative for dizziness, syncope, light-headedness and headaches.       Objective:   Physical Exam Vitals signs reviewed.  Constitutional:      Appearance: She is well-developed.  HENT:     Head: Normocephalic and atraumatic.  Eyes:     Conjunctiva/sclera: Conjunctivae normal.     Pupils: Pupils are equal, round, and reactive to light.  Neck:     Vascular: No carotid bruit.  Cardiovascular:     Rate and Rhythm: Normal rate and regular rhythm.     Heart sounds: Normal heart sounds.  Pulmonary:     Effort: Pulmonary effort is normal.     Breath sounds: Normal breath sounds.  Abdominal:     Palpations: Abdomen is soft. There is no pulsatile mass.     Tenderness: There is no abdominal tenderness.  Skin:    General: Skin is warm and dry.  Neurological:     Mental Status: She is alert and oriented to person, place, and time.  Psychiatric:        Behavior: Behavior normal.    Vitals:   02/07/19 1009  BP: 134/74  Pulse: 69  Resp: 18  Temp: 98.8 F (37.1 C)  TempSrc: Oral  SpO2: 96%  Weight: 194 lb 12.8 oz (88.4 kg)  Height: 5' 3" (1.6 m)       Assessment & Plan:   OPLE GIRGIS is a 73 y.o. female Type 2 diabetes mellitus with microalbuminuria, without long-term current use of insulin ([place]) - Plan: POCT glycosylated hemoglobin (Hb A1C), Microalbumin / creatinine urine ratio, dapagliflozin propanediol (FARXIGA) 10 MG TABS tablet  -Commended on diet exercise with weight loss but unfortunately A1c still elevated,  slightly higher than last visit.  This is with her taking [place] daily.  Did discuss other options including possible Invokana may be a lower cost, but she would like to remain on farxiga after discussion of that medicine and potential risks.  -Trial of higher dose of farxiga at 10 mg daily, continue glipizide and metformin same doses for now.  Recheck 3 months.  Okay to refill meds if needed prior to that time  Meds ordered this encounter  Medications  . dapagliflozin propanediol (FARXIGA) 10 MG TABS tablet    Sig: Take 10 mg by mouth daily.    Dispense:  90 tablet    Refill:  1   Patient Instructions   Try higher dose of Farxiga at 10 mg, continue glipizide and metformin same doses.  Continue the good work with diet and exercise as weight has gone down.  Recheck in 3 months.  Let me know if any refills needed before that visit.  Thank you for coming in today!  If you have lab work done today you will be contacted with your lab results within the next 2 weeks.  If you have not heard from [place] then please contact us. The fastest way to get your results is to register for My Chart.   IF you received an x-ray today, you will receive an invoice from [facility]. Please contact [facility] at [phone] with questions or concerns regarding your invoice.   IF you received labwork today, you will receive an invoice from [place]. Please contact [facility] at [phone] with questions or concerns regarding your invoice.   Our billing staff will not be able to assist you with questions regarding bills from these companies.  You will be contacted with the lab results as soon  as they are available. The fastest way to get your results is to activate your My Chart account. Instructions are located on the last page of this paperwork. If you have not heard from [place] regarding the results in 2 weeks, please contact this office.      Signed,   [name], MD Primary Care at  [place].  02/07/19 11:16 AM

## 2019-02-08 LAB — MICROALBUMIN / CREATININE URINE RATIO
Creatinine, Urine: 88 mg/dL
Microalb/Creat Ratio: 59 mg/g creat — ABNORMAL HIGH (ref 0–29)
Microalbumin, Urine: 51.5 ug/mL

## 2019-02-18 ENCOUNTER — Encounter: Payer: Self-pay | Admitting: Radiology

## 2019-02-27 ENCOUNTER — Telehealth: Payer: Self-pay | Admitting: Family Medicine

## 2019-02-27 DIAGNOSIS — R809 Proteinuria, unspecified: Secondary | ICD-10-CM

## 2019-02-27 DIAGNOSIS — E1129 Type 2 diabetes mellitus with other diabetic kidney complication: Secondary | ICD-10-CM

## 2019-02-27 NOTE — Telephone Encounter (Signed)
Per Dr. Carlota Raspberry  "Try higher dose of Farxiga at 10 mg, continue glipizide and metformin same doses.  Continue the good work with diet and exercise as weight has gone down.  Recheck in 3 months.  Let me know if any refills needed before that visit. "  I called pt and stated for pt to take two for her 5mg . Pt states that her pharmacy told her to also take two of the 5mg . Pt states understand.

## 2019-02-27 NOTE — Telephone Encounter (Signed)
Copied from [place] [id number]. Topic: Quick Communication - Rx Refill/Question >> Feb 27, 2019 10:09 AM Margot Ables wrote: Medication: dapagliflozin propanediol (FARXIGA) 10 MG TABS tablet   Has the patient contacted their pharmacy? Yes - pharmacy calling stating they did not receive RX 02/07/2019 for increase to 10mg  - pt just picked up RX for 5mg  and told them it was supposed to be changed.   Preferred Pharmacy (with phone number or street name): [id number] [address], [place] [phone] (Phone) [phone] (Fax)

## 2019-02-28 ENCOUNTER — Telehealth: Payer: Self-pay | Admitting: *Deleted

## 2019-02-28 NOTE — Telephone Encounter (Signed)
Schedule AWV Telemed

## 2019-03-12 NOTE — Telephone Encounter (Signed)
Tammy from Pharmacy called again today stating Susan Miles had been in again requesting the 10 mg Farxiga. Tammy is requesting that the refill request be sent in again.   [id number] [address], [place]  [place] [address]  Phone: [phone] Fax: [phone]  Not a 24 hour pharmacy; exact hours not known.

## 2019-03-14 MED ORDER — DAPAGLIFLOZIN PROPANEDIOL 10 MG PO TABS: 10.0000 mg | ORAL_TABLET | Freq: Every day | ORAL | 1 refills | Status: DC

## 2019-03-14 NOTE — Telephone Encounter (Signed)
Contacting [facility] and advised farxiga 10 mg #90 with1 refill sent on 02/07/2019, per pharmacist not received -last fill was for farxiga 5 mg.  Advised I will resend electronically.   Dgaddy, CMA

## 2019-05-02 ENCOUNTER — Other Ambulatory Visit: Payer: Self-pay | Admitting: Family Medicine

## 2019-05-02 DIAGNOSIS — Z1231 Encounter for screening mammogram for malignant neoplasm of breast: Secondary | ICD-10-CM

## 2019-05-06 ENCOUNTER — Telehealth (INDEPENDENT_AMBULATORY_CARE_PROVIDER_SITE_OTHER): Payer: Medicare HMO | Admitting: Family Medicine

## 2019-05-06 DIAGNOSIS — E782 Mixed hyperlipidemia: Secondary | ICD-10-CM

## 2019-05-06 DIAGNOSIS — E1129 Type 2 diabetes mellitus with other diabetic kidney complication: Secondary | ICD-10-CM | POA: Diagnosis not present

## 2019-05-06 DIAGNOSIS — R809 Proteinuria, unspecified: Secondary | ICD-10-CM

## 2019-05-06 DIAGNOSIS — M109 Gout, unspecified: Secondary | ICD-10-CM

## 2019-05-06 DIAGNOSIS — I10 Essential (primary) hypertension: Secondary | ICD-10-CM

## 2019-05-06 MED ORDER — CLONIDINE HCL 0.1 MG PO TABS: 0.1000 mg | ORAL_TABLET | Freq: Two times a day (BID) | ORAL | 1 refills | Status: DC

## 2019-05-06 MED ORDER — METFORMIN HCL 1000 MG PO TABS: 1000.0000 mg | ORAL_TABLET | Freq: Two times a day (BID) | ORAL | 1 refills | Status: DC

## 2019-05-06 MED ORDER — LOSARTAN POTASSIUM 100 MG PO TABS: 100.0000 mg | ORAL_TABLET | Freq: Every day | ORAL | 1 refills | Status: DC

## 2019-05-06 MED ORDER — CLONIDINE HCL 0.2 MG PO TABS: ORAL_TABLET | ORAL | 1 refills | Status: DC

## 2019-05-06 MED ORDER — ATORVASTATIN CALCIUM 20 MG PO TABS: 20.0000 mg | ORAL_TABLET | Freq: Every day | ORAL | 1 refills | Status: DC

## 2019-05-06 MED ORDER — AMLODIPINE BESYLATE 10 MG PO TABS: 10.0000 mg | ORAL_TABLET | Freq: Every day | ORAL | 1 refills | Status: DC

## 2019-05-06 MED ORDER — METOPROLOL SUCCINATE ER 200 MG PO TB24: ORAL_TABLET | ORAL | 1 refills | Status: DC

## 2019-05-06 MED ORDER — GLIPIZIDE 10 MG PO TABS: ORAL_TABLET | ORAL | 1 refills | Status: DC

## 2019-05-06 MED ORDER — DAPAGLIFLOZIN PROPANEDIOL 10 MG PO TABS: 10.0000 mg | ORAL_TABLET | Freq: Every day | ORAL | 1 refills | Status: DC

## 2019-05-06 NOTE — Progress Notes (Signed)
Virtual Visit via Telephone Note  I connected with Susan Miles on 05/06/19 at 9:53 AM by telephone and verified that I am speaking with the correct person using two identifiers.   I discussed the limitations, risks, security and privacy concerns of performing an evaluation and management service by telephone and the availability of in person appointments. I also discussed with the patient that there may be a patient responsible charge related to this service. The patient expressed understanding and agreed to proceed, consent obtained  Chief complaint: Diabetes  History of Present Illness: Susan Miles is a 73 y.o. female   Diabetes: Complicated by microalbuminuria. Home blood sugar was 139 yesterday, 121 today. Reports overall stable home readings. Low of 99, usually around low 120-130.  No new side effects of medications. Occasional dry skin - using oil/lotion to areas.  No symptomatic lows.  Glipizide 20 mg twice daily, metformin 1000 mg twice daily, farxiga 14m qd.  Lipitor 20 mg for statin, losartan 100 mg for ACE inhibitor. Microalbumin: Elevated ratio of 59 in March Optho, foot exam, pneumovax: optho visit in May 2019 - appt delayed d/t pandemic. Otherwise up to date.  Some exercise, but social distancing.    Lab Results  Component Value Date   HGBA1C 7.7 (A) 02/07/2019   HGBA1C 7.6 (H) 10/08/2018   HGBA1C 7.5 (H) 07/02/2018   Lab Results  Component Value Date   MICROALBUR 7.5 03/23/2016   LDLCALC 80 10/08/2018   CREATININE 1.03 (H) 10/08/2018   Hyperlipidemia:  Lab Results  Component Value Date   CHOL 141 10/08/2018   HDL 42 10/08/2018   LDLCALC 80 10/08/2018   TRIG 97 10/08/2018   CHOLHDL 3.4 10/08/2018   Lab Results  Component Value Date   ALT 12 10/08/2018   AST 9 10/08/2018   ALKPHOS 70 10/08/2018   BILITOT 0.4 10/08/2018  Lipitor 20 mg daily, no new side effects. No new myalgias.   Hypertension: BP Readings from Last 3 Encounters:  02/07/19  134/74  10/08/18 126/70  07/02/18 132/80   Lab Results  Component Value Date   CREATININE 1.03 (H) 10/08/2018  Amlodipine 10 mg daily, clonidine total of 0.3 mg twice daily, losartan 100 mg daily, metoprolol XL 200 mg daily. Constitutional: Negative for fatigue and unexpected weight change.  Eyes: Negative for visual disturbance.  Respiratory: Negative for cough, chest tightness and shortness of breath.   Cardiovascular: Negative for chest pain, palpitations and leg swelling.  Gastrointestinal: Negative for abdominal pain and blood in stool.  Neurological: Negative for dizziness, light-headedness and headaches.  Home readings - higher in am before med - 157/89, then but after meds and exercise - down in normal range below 140/90 usually.   Gout: Allopurinol 200 mg used in past. No flairs in past year. Decided to stop meds few months ago d/t cost.  Has some if restart needed.  Lab Results  Component Value Date   LABURIC 6.0 10/08/2018      Patient Active Problem List   Diagnosis Date Noted  . Hyperlipidemia 12/21/2017  . Breast asymmetry 05/01/2015  . Cataract, nuclear 05/24/2012  . HTN (hypertension) 02/10/2012  . DM2 (diabetes mellitus, type 2) ([place] 02/10/2012   Past Medical History:  Diagnosis Date  . [place]   . Hypertension    No past surgical history on file. Allergies  Allergen Reactions  . Ace Inhibitors Cough   Prior to Admission medications   Medication Sig Start Date End Date  Taking? Authorizing Provider  acetaminophen (TYLENOL) 500 MG tablet Take 500 mg by mouth every 6 (six) hours as needed for mild pain.   Yes [provider]  allopurinol (ZYLOPRIM) 100 MG tablet Take 2 tablets (200 mg total) by mouth daily. 10/08/18  Yes [name], MD  amLODipine (NORVASC) 10 MG tablet Take 1 tablet (10 mg total) by mouth daily. 10/08/18  Yes [name], MD  aspirin 81 MG tablet Take 81 mg by mouth daily.   Yes  [provider]  atorvastatin (LIPITOR) 20 MG tablet Take 1 tablet (20 mg total) by mouth daily at 6 PM. 10/08/18  Yes [name], MD  cetirizine (ZYRTEC) 10 MG tablet Take 1 tablet (10 mg total) by mouth daily. 09/21/15  Yes [name], MD  cloNIDine (CATAPRES) 0.1 MG tablet Take 1 tablet (0.1 mg total) by mouth 2 (two) times daily. 10/08/18  Yes [name], MD  cloNIDine (CATAPRES) 0.2 MG tablet TAKE 1 TABLET (0.2 MG TOTAL) BY MOUTH 2 TIMES DAILY 10/08/18  Yes [name], MD  dapagliflozin propanediol (FARXIGA) 10 MG TABS tablet Take 10 mg by mouth daily. 03/14/19  Yes [name], MD  fluticasone (FLONASE) 50 MCG/ACT nasal spray Place 1-2 sprays into both nostrils daily. 04/02/18  Yes [name], MD  glipiZIDE (GLUCOTROL) 10 MG tablet TAKE 2 TABLETS (20 MG TOTAL) BY MOUTH 2 (TWO) TIMES DAILY BEFORE A MEAL. 10/08/18  Yes [name], MD  glucose blood (ACCU-CHEK AVIVA PLUS) test strip USE TO TEST BLOOD SUGAR ONCE DAILY. DX E11.9 01/24/18  Yes [name], MD  Lancets MISC Test blood sugar once daily. Dx: E11.9 01/24/18  Yes [name], MD  losartan (COZAAR) 100 MG tablet Take 1 tablet (100 mg total) by mouth daily. 10/08/18  Yes [name], MD  metFORMIN (GLUCOPHAGE) 1000 MG tablet Take 1 tablet (1,000 mg total) by mouth 2 (two) times daily with a meal. 10/08/18  Yes [name], MD  metoprolol (TOPROL-XL) 200 MG 24 hr tablet TAKE 1 TABLET (200 MG TOTAL) BY MOUTH DAILY. 10/08/18  Yes [name], MD  Blood Glucose Monitoring Suppl (LDR BLOOD GLUCOSE TRUETEST) w/Device KIT 1 Device by Does not apply route daily. Test blood sugar once daily. Dx: E11.9 01/24/18   [name], MD   Social History   Socioeconomic History  . Marital status: Married    Spouse name: Not on file  . Number of children: Not on file  . Years of education: Not on file  . Highest education level: Not on file  Occupational History  . Not on  file  Social Needs  . Financial resource strain: Not on file  . Food insecurity:    Worry: Not on file    Inability: Not on file  . Transportation needs:    Medical: Not on file    Non-medical: Not on file  Tobacco Use  . Smoking status: Never Smoker  . Smokeless tobacco: Never Used  Substance and Sexual Activity  . Alcohol use: No  . Drug use: No  . Sexual activity: Not on file  Lifestyle  . Physical activity:    Days per week: Not on file    Minutes per session: Not on file  . Stress: Not on file  Relationships  . Social connections:    Talks on phone: Not on file    Gets together: Not on file    Attends  religious service: Not on file    Active member of club or organization: Not on file    Attends meetings of clubs or organizations: Not on file    Relationship status: Not on file  . Intimate partner violence:    Fear of current or ex partner: Not on file    Emotionally abused: Not on file    Physically abused: Not on file    Forced sexual activity: Not on file  Other Topics Concern  . Not on file  Social History Narrative  . Not on file     Observations/Objective: No distress.  Appropriate responses.  All questions answered with understanding expressed.   Assessment and Plan: Type 2 diabetes mellitus with microalbuminuria, without long-term current use of insulin ([facility]) - Plan: Hemoglobin A1c, Comprehensive metabolic panel, glipiZIDE (GLUCOTROL) 10 MG tablet, metFORMIN (GLUCOPHAGE) 1000 MG tablet, dapagliflozin propanediol (FARXIGA) 10 MG TABS tablet  -Overall stable home readings, tolerating current regimen.  Check A1c at last visit.  Continue ACE inhibitor with microalbuminuria.  Plans on follow-up with ophthalmologist once office is open  Essential hypertension - Plan: Comprehensive metabolic panel, cloNIDine (CATAPRES) 0.2 MG tablet, cloNIDine (CATAPRES) 0.1 MG tablet, metoprolol (TOPROL-XL) 200 MG 24 hr tablet, amLODipine (NORVASC) 10 MG tablet, losartan  (COZAAR) 100 MG tablet  -Overall stable home readings.  Continue same regimen as currently tolerated including with clonidine as denies new side effects.  Mixed hyperlipidemia - Plan: Lipid panel, Comprehensive metabolic panel, atorvastatin (LIPITOR) 20 MG tablet  -Tolerating Lipitor, continue same.  Plan for fasting lab visit  Gout, unspecified cause, unspecified chronicity, unspecified site - Plan: Uric Acid  -Denies recent flares, including off allopurinol.  Check uric acid, consider restart allopurinol if elevated, especially if any recurrence of gout flare.  Follow Up Instructions: Lab visit next week to 2 weeks, then 37-monthappointment with me. Patient Instructions   I will check some labs, including gout test. If flare of gout, or uric acid is too high, may need to restart meds.   No change in other meds.   Follow up in 3 months for diabetic  If you have lab work done today you will be contacted with your lab results within the next 2 weeks.  If you have not heard from [place] please contact [place] The fastest way to get your results is to register for My Chart.   IF you received an x-ray today, you will receive an invoice from [facility]. Please contact [facility] at [phone] questions or concerns regarding your invoice.   IF you received labwork today, you will receive an invoice from [place] Please contact [facility] at [phone] questions or concerns regarding your invoice.   Our billing staff will not be able to assist you with questions regarding bills from these companies.  You will be contacted with the lab results as soon as they are available. The fastest way to get your results is to activate your My Chart account. Instructions are located on the last page of this paperwork. If you have not heard from [place] the results in 2 weeks, please contact this office.          I discussed the assessment and treatment plan with the  patient. The patient was provided an opportunity to ask questions and all were answered. The patient agreed with the plan and demonstrated an understanding of the instructions.   The patient was advised to call back or seek an in-person evaluation if the  symptoms worsen or if the condition fails to improve as anticipated.  I provided 11 minutes of non-face-to-face time during this encounter.  Signed,   [name], MD Primary Care at [place].  05/06/19

## 2019-05-06 NOTE — Progress Notes (Signed)
CC- Patient stated she is doing fine and feeling fine. Patient has been checking her blood sugars and they have been running fine. Her Blood sugar yesterday was 139.

## 2019-05-06 NOTE — Patient Instructions (Addendum)
I will check some labs, including gout test. If flare of gout, or uric acid is too high, may need to restart meds.   No change in other meds.   Follow up in 3 months for medication review, and diabetes check, let me know if there are questions prior to that time.   If you have lab work done today you will be contacted with your lab results within the next 2 weeks.  If you have not heard from [place] then please contact us. The fastest way to get your results is to register for My Chart.   IF you received an x-ray today, you will receive an invoice from [facility]. Please contact [facility] at [phone] with questions or concerns regarding your invoice.   IF you received labwork today, you will receive an invoice from [place]. Please contact [facility] at [phone] with questions or concerns regarding your invoice.   Our billing staff will not be able to assist you with questions regarding bills from these companies.  You will be contacted with the lab results as soon as they are available. The fastest way to get your results is to activate your My Chart account. Instructions are located on the last page of this paperwork. If you have not heard from [place] regarding the results in 2 weeks, please contact this office.

## 2019-05-09 ENCOUNTER — Ambulatory Visit (INDEPENDENT_AMBULATORY_CARE_PROVIDER_SITE_OTHER): Payer: Medicare HMO | Admitting: Family Medicine

## 2019-05-09 ENCOUNTER — Other Ambulatory Visit: Payer: Self-pay

## 2019-05-09 DIAGNOSIS — E1129 Type 2 diabetes mellitus with other diabetic kidney complication: Secondary | ICD-10-CM

## 2019-05-09 DIAGNOSIS — I10 Essential (primary) hypertension: Secondary | ICD-10-CM

## 2019-05-09 DIAGNOSIS — M109 Gout, unspecified: Secondary | ICD-10-CM

## 2019-05-09 DIAGNOSIS — E782 Mixed hyperlipidemia: Secondary | ICD-10-CM | POA: Diagnosis not present

## 2019-05-09 DIAGNOSIS — R809 Proteinuria, unspecified: Secondary | ICD-10-CM | POA: Diagnosis not present

## 2019-05-10 LAB — COMPREHENSIVE METABOLIC PANEL
ALT: 12 IU/L (ref 0–32)
AST: 11 IU/L (ref 0–40)
Albumin/Globulin Ratio: 1.5 (ref 1.2–2.2)
Albumin: 4.1 g/dL (ref 3.7–4.7)
Alkaline Phosphatase: 70 IU/L (ref 39–117)
BUN/Creatinine Ratio: 12 (ref 12–28)
BUN: 12 mg/dL (ref 8–27)
Bilirubin Total: 0.4 mg/dL (ref 0.0–1.2)
CO2: 18 mmol/L — ABNORMAL LOW (ref 20–29)
Calcium: 10.6 mg/dL — ABNORMAL HIGH (ref 8.7–10.3)
Chloride: 106 mmol/L (ref 96–106)
Creatinine, Ser: 1 mg/dL (ref 0.57–1.00)
GFR calc Af Amer: 65 mL/min/{1.73_m2} (ref >59)
GFR calc non Af Amer: 56 mL/min/{1.73_m2} — ABNORMAL LOW (ref >59)
Globulin, Total: 2.7 g/dL (ref 1.5–4.5)
Glucose: 143 mg/dL — ABNORMAL HIGH (ref 65–99)
Potassium: 4.2 mmol/L (ref 3.5–5.2)
Sodium: 140 mmol/L (ref 134–144)
Total Protein: 6.8 g/dL (ref 6.0–8.5)

## 2019-05-10 LAB — LIPID PANEL
Chol/HDL Ratio: 3 ratio (ref 0.0–4.4)
Cholesterol, Total: 127 mg/dL (ref 100–199)
HDL: 42 mg/dL (ref >39)
LDL Calculated: 73 mg/dL (ref 0–99)
Triglycerides: 60 mg/dL (ref 0–149)
VLDL Cholesterol Cal: 12 mg/dL (ref 5–40)

## 2019-05-10 LAB — HEMOGLOBIN A1C
Est. average glucose Bld gHb Est-mCnc: 166 mg/dL
Hgb A1c MFr Bld: 7.4 % — ABNORMAL HIGH (ref 4.8–5.6)

## 2019-05-10 LAB — URIC ACID: Uric Acid: 5.2 mg/dL (ref 2.5–7.1)

## 2019-06-12 DIAGNOSIS — N390 Urinary tract infection, site not specified: Secondary | ICD-10-CM | POA: Diagnosis not present

## 2019-06-12 DIAGNOSIS — N182 Chronic kidney disease, stage 2 (mild): Secondary | ICD-10-CM | POA: Diagnosis not present

## 2019-06-12 DIAGNOSIS — R809 Proteinuria, unspecified: Secondary | ICD-10-CM | POA: Diagnosis not present

## 2019-06-12 DIAGNOSIS — I129 Hypertensive chronic kidney disease with stage 1 through stage 4 chronic kidney disease, or unspecified chronic kidney disease: Secondary | ICD-10-CM | POA: Diagnosis not present

## 2019-06-21 ENCOUNTER — Other Ambulatory Visit: Payer: Self-pay

## 2019-06-21 ENCOUNTER — Ambulatory Visit
Admission: RE | Admit: 2019-06-21 | Discharge: 2019-06-21 | Disposition: A | Discharge status: Home / Self Care | Payer: Medicare HMO | Source: Ambulatory Visit | Attending: Family Medicine | Admitting: Family Medicine

## 2019-06-21 DIAGNOSIS — Z1231 Encounter for screening mammogram for malignant neoplasm of breast: Secondary | ICD-10-CM

## 2019-06-24 ENCOUNTER — Other Ambulatory Visit: Payer: Self-pay | Admitting: Family Medicine

## 2019-06-24 DIAGNOSIS — R928 Other abnormal and inconclusive findings on diagnostic imaging of breast: Secondary | ICD-10-CM

## 2019-06-26 ENCOUNTER — Ambulatory Visit
Admission: RE | Admit: 2019-06-26 | Discharge: 2019-06-26 | Disposition: A | Discharge status: Home / Self Care | Payer: Medicare HMO | Source: Ambulatory Visit | Attending: Family Medicine | Admitting: Family Medicine

## 2019-06-26 ENCOUNTER — Other Ambulatory Visit: Payer: Self-pay | Admitting: Family Medicine

## 2019-06-26 ENCOUNTER — Other Ambulatory Visit: Payer: Self-pay

## 2019-06-26 DIAGNOSIS — N6311 Unspecified lump in the right breast, upper outer quadrant: Secondary | ICD-10-CM | POA: Diagnosis not present

## 2019-06-26 DIAGNOSIS — R928 Other abnormal and inconclusive findings on diagnostic imaging of breast: Secondary | ICD-10-CM

## 2019-06-26 DIAGNOSIS — N6313 Unspecified lump in the right breast, lower outer quadrant: Secondary | ICD-10-CM | POA: Diagnosis not present

## 2019-06-26 DIAGNOSIS — R921 Mammographic calcification found on diagnostic imaging of breast: Secondary | ICD-10-CM | POA: Diagnosis not present

## 2019-06-26 DIAGNOSIS — N6489 Other specified disorders of breast: Secondary | ICD-10-CM

## 2019-06-28 ENCOUNTER — Ambulatory Visit
Admission: RE | Admit: 2019-06-28 | Discharge: 2019-06-28 | Disposition: A | Discharge status: Home / Self Care | Payer: Medicare HMO | Source: Ambulatory Visit | Attending: Family Medicine | Admitting: Family Medicine

## 2019-06-28 ENCOUNTER — Other Ambulatory Visit: Payer: Self-pay

## 2019-06-28 DIAGNOSIS — D0511 Intraductal carcinoma in situ of right breast: Secondary | ICD-10-CM | POA: Diagnosis not present

## 2019-06-28 DIAGNOSIS — R921 Mammographic calcification found on diagnostic imaging of breast: Secondary | ICD-10-CM

## 2019-06-28 DIAGNOSIS — R928 Other abnormal and inconclusive findings on diagnostic imaging of breast: Secondary | ICD-10-CM | POA: Diagnosis not present

## 2019-06-28 DIAGNOSIS — N6489 Other specified disorders of breast: Secondary | ICD-10-CM

## 2019-07-03 ENCOUNTER — Telehealth: Payer: Self-pay | Admitting: Hematology and Oncology

## 2019-07-03 NOTE — Telephone Encounter (Signed)
Spoke to patient to confirm morning [facility] appointment on 8/5, packet will be mailed to patient

## 2019-07-04 ENCOUNTER — Telehealth: Payer: Self-pay | Admitting: *Deleted

## 2019-07-04 ENCOUNTER — Encounter: Payer: Self-pay | Admitting: *Deleted

## 2019-07-04 ENCOUNTER — Other Ambulatory Visit: Payer: Self-pay | Admitting: *Deleted

## 2019-07-04 DIAGNOSIS — D0511 Intraductal carcinoma in situ of right breast: Secondary | ICD-10-CM | POA: Insufficient documentation

## 2019-07-04 NOTE — Telephone Encounter (Signed)
Schedule AWV.  

## 2019-07-09 NOTE — Progress Notes (Signed)
[place] NOTE  Patient Care Team: [name], MD as PCP - General (Family Medicine) Webb Laws, [place] as Referring Physician (Optometry) Rockwell Germany, RN as Oncology Nurse Navigator Mauro Kaufmann, RN as Oncology Nurse Navigator Nicholas Lose, MD as Consulting Physician (Hematology and Oncology) Kyung Rudd, MD as Consulting Physician (Radiation Oncology) Alphonsa Overall, MD as Consulting Physician (General Surgery)  CHIEF COMPLAINTS/PURPOSE OF CONSULTATION:  Newly diagnosed breast cancer  HISTORY OF PRESENTING ILLNESS:  Susan Miles 73 y.o. female is here because of recent diagnosis of DCIS of the right breast. The cancer was detected on a routine screening mammogram on 06/21/19 and was not palpable. Diagnostic mammogram and [place] on 06/26/19 showed a 1.6cm mass in the right breast at the 9 o'clock position, loosely group calcifications in the upper right breast spanning 3.6cm, and no axillary adenopathy. Biopsy on 06/28/19 showed intermediate grade DCIS, ER 100%, PR 100%. She presents to the clinic today for initial evaluation and discussion of treatment options.   I reviewed her records extensively and collaborated the history with the patient.  SUMMARY OF ONCOLOGIC HISTORY: Oncology History  Ductal carcinoma in situ (DCIS) of right breast  07/04/2019 Initial Diagnosis   Routine screening mammogram detected 1.6cm mass in the right breast at the 9 o'clock position, loosely group calcifications in the upper right breast spanning 3.6cm, and no axillary adenopathy. Biopsy showed intermediate grade DCIS, ER 100%, PR 100%.    07/10/2019 Cancer Staging   Staging form: Breast, AJCC 8th Edition - Clinical stage from 07/10/2019: Stage 0 (cTis (DCIS), cN0, cM0, ER+, PR+, HER2: Not Assessed) - Signed by Nicholas Lose, MD on 07/10/2019     MEDICAL HISTORY:  Past Medical History:  Diagnosis Date  . Diabetes mellitus without complication ([place])   . Hypertension      SURGICAL HISTORY: Past Surgical History:  Procedure Laterality Date  . ABDOMINAL HYSTERECTOMY      SOCIAL HISTORY: Social History   Socioeconomic History  . Marital status: Married    Spouse name: Not on file  . Number of children: Not on file  . Years of education: Not on file  . Highest education level: Not on file  Occupational History  . Not on file  Social Needs  . Financial resource strain: Not on file  . Food insecurity    Worry: Not on file    Inability: Not on file  . Transportation needs    Medical: Not on file    Non-medical: Not on file  Tobacco Use  . Smoking status: Never Smoker  . Smokeless tobacco: Never Used  Substance and Sexual Activity  . Alcohol use: No  . Drug use: No  . Sexual activity: Not on file  Lifestyle  . Physical activity    Days per week: Not on file    Minutes per session: Not on file  . Stress: Not on file  Relationships  . Social Herbalist on phone: Not on file    Gets together: Not on file    Attends religious service: Not on file    Active member of club or organization: Not on file    Attends meetings of clubs or organizations: Not on file    Relationship status: Not on file  . Intimate partner violence    Fear of current or ex partner: Not on file    Emotionally abused: Not on file    Physically abused: Not on file  Forced sexual activity: Not on file  Other Topics Concern  . Not on file  Social History Narrative  . Not on file    FAMILY HISTORY: Family History  Problem Relation Age of Onset  . Hypertension Mother   . Heart disease Father     ALLERGIES:  is allergic to ace inhibitors.  MEDICATIONS:  Current Outpatient Medications  Medication Sig Dispense Refill  . acetaminophen (TYLENOL) 500 MG tablet Take 500 mg by mouth every 6 (six) hours as needed for mild pain.    Marland Kitchen amLODipine (NORVASC) 10 MG tablet Take 1 tablet (10 mg total) by mouth daily. 90 tablet 1  . aspirin 81 MG tablet Take  81 mg by mouth daily.    Marland Kitchen atorvastatin (LIPITOR) 20 MG tablet Take 1 tablet (20 mg total) by mouth daily at 6 PM. 90 tablet 1  . Blood Glucose Monitoring Suppl (LDR BLOOD GLUCOSE TRUETEST) w/Device KIT 1 Device by Does not apply route daily. Test blood sugar once daily. Dx: E11.9 1 kit 0  . cetirizine (ZYRTEC) 10 MG tablet Take 1 tablet (10 mg total) by mouth daily. 90 tablet 1  . cloNIDine (CATAPRES) 0.1 MG tablet Take 1 tablet (0.1 mg total) by mouth 2 (two) times daily. 180 tablet 1  . cloNIDine (CATAPRES) 0.2 MG tablet TAKE 1 TABLET (0.2 MG TOTAL) BY MOUTH 2 TIMES DAILY 180 tablet 1  . dapagliflozin propanediol (FARXIGA) 10 MG TABS tablet Take 10 mg by mouth daily. 90 tablet 1  . fluticasone (FLONASE) 50 MCG/ACT nasal spray Place 1-2 sprays into both nostrils daily. 16 g 6  . glipiZIDE (GLUCOTROL) 10 MG tablet TAKE 2 TABLETS (20 MG TOTAL) BY MOUTH 2 (TWO) TIMES DAILY BEFORE A MEAL. 360 tablet 1  . glucose blood (ACCU-CHEK AVIVA PLUS) test strip USE TO TEST BLOOD SUGAR ONCE DAILY. DX E11.9 100 each 1  . Lancets MISC Test blood sugar once daily. Dx: E11.9 100 each 3  . losartan (COZAAR) 100 MG tablet Take 1 tablet (100 mg total) by mouth daily. 90 tablet 1  . metFORMIN (GLUCOPHAGE) 1000 MG tablet Take 1 tablet (1,000 mg total) by mouth 2 (two) times daily with a meal. 180 tablet 1  . metoprolol (TOPROL-XL) 200 MG 24 hr tablet TAKE 1 TABLET (200 MG TOTAL) BY MOUTH DAILY. 90 tablet 1   No current facility-administered medications for this visit.     REVIEW OF SYSTEMS:   Constitutional: Denies fevers, chills or abnormal night sweats Eyes: Denies blurriness of vision, double vision or watery eyes Ears, nose, mouth, throat, and face: Denies mucositis or sore throat Respiratory: Denies cough, dyspnea or wheezes Cardiovascular: Denies palpitation, chest discomfort or lower extremity swelling Gastrointestinal:  Denies nausea, heartburn or change in bowel habits Skin: Denies abnormal skin rashes  Lymphatics: Denies new lymphadenopathy or easy bruising Neurological:Denies numbness, tingling or new weaknesses Behavioral/Psych: Mood is stable, no new changes  Breast: Denies any palpable lumps or discharge All other systems were reviewed with the patient and are negative.  PHYSICAL EXAMINATION: ECOG PERFORMANCE STATUS: 0 - Asymptomatic  Vitals:   07/10/19 0841  BP: 131/61  Pulse: 73  Resp: 18  Temp: 98 F (36.7 C)  SpO2: 100%   Filed Weights   07/10/19 0841  Weight: 189 lb 3.2 oz (85.8 kg)    GENERAL:alert, no distress and comfortable SKIN: skin color, texture, turgor are normal, no rashes or significant lesions EYES: normal, conjunctiva are pink and non-injected, sclera clear OROPHARYNX:no exudate, no erythema  and lips, buccal mucosa, and tongue normal  NECK: supple, thyroid normal size, non-tender, without nodularity LYMPH:  no palpable lymphadenopathy in the cervical, axillary or inguinal LUNGS: clear to auscultation and percussion with normal breathing effort HEART: regular rate & rhythm and no murmurs and no lower extremity edema ABDOMEN:abdomen soft, non-tender and normal bowel sounds Musculoskeletal:no cyanosis of digits and no clubbing  PSYCH: alert & oriented x 3 with fluent speech NEURO: no focal motor/sensory deficits BREAST: No palpable nodules in breast. No palpable axillary or supraclavicular lymphadenopathy (exam performed in the presence of a chaperone)   LABORATORY DATA:  I have reviewed the data as listed Lab Results  Component Value Date   WBC 5.4 07/10/2019   HGB 13.1 07/10/2019   HCT 41.5 07/10/2019   MCV 85.7 07/10/2019   PLT 329 07/10/2019   Lab Results  Component Value Date   NA 138 07/10/2019   K 3.9 07/10/2019   CL 105 07/10/2019   CO2 20 (L) 07/10/2019    RADIOGRAPHIC STUDIES: I have personally reviewed the radiological reports and agreed with the findings in the report.  ASSESSMENT AND PLAN:  Ductal carcinoma in situ (DCIS)  of right breast 07/04/2019:Routine screening mammogram detected 1.6cm mass in the right breast at the 9 o'clock position, loosely group calcifications in the upper right breast spanning 3.6cm, and no axillary adenopathy. Biopsy showed intermediate grade DCIS, ER 100%, PR 100%.  Tix Nx Stage 0  Pathology review: I discussed with the patient the difference between DCIS and invasive breast cancer. It is considered a precancerous lesion. DCIS is classified as a 0. It is generally detected through mammograms as calcifications. We discussed the significance of grades and its impact on prognosis. We also discussed the importance of ER and PR receptors and their implications to adjuvant treatment options. Prognosis of DCIS dependence on grade, comedo necrosis. It is anticipated that if not treated, 20-30% of DCIS can develop into invasive breast cancer.  Recommendation: 1. Breast conserving surgery 2. Followed by adjuvant radiation therapy 3. Followed by antiestrogen therapy with tamoxifen 5 years  Tamoxifen counseling: We discussed the risks and benefits of tamoxifen. These include but not limited to insomnia, hot flashes, mood changes, vaginal dryness, and weight gain. Although rare, serious side effects including endometrial cancer, risk of blood clots were also discussed. We strongly believe that the benefits far outweigh the risks. Patient understands these risks and consented to starting treatment. Planned treatment duration is 5 years.  Return to clinic after surgery to discuss the final pathology report and come up with an adjuvant treatment plan.     All questions were answered. The patient knows to call the clinic with any problems, questions or concerns.   Rulon Eisenmenger, MD 07/10/2019    I, Molly Dorshimer, am acting as scribe for Nicholas Lose, MD.  I have reviewed the above documentation for accuracy and completeness, and I agree with the above.

## 2019-07-10 ENCOUNTER — Other Ambulatory Visit (HOSPITAL_COMMUNITY): Payer: Self-pay | Admitting: Surgery

## 2019-07-10 ENCOUNTER — Ambulatory Visit
Admission: RE | Admit: 2019-07-10 | Discharge: 2019-07-10 | Disposition: A | Discharge status: Home / Self Care | Payer: Medicare HMO | Source: Ambulatory Visit | Attending: Radiation Oncology | Admitting: Radiation Oncology

## 2019-07-10 ENCOUNTER — Inpatient Hospital Stay: Payer: Medicare HMO | Attending: Hematology and Oncology | Admitting: Hematology and Oncology

## 2019-07-10 ENCOUNTER — Inpatient Hospital Stay: Payer: Medicare HMO

## 2019-07-10 ENCOUNTER — Other Ambulatory Visit: Payer: Self-pay

## 2019-07-10 ENCOUNTER — Encounter: Payer: Self-pay | Admitting: Hematology and Oncology

## 2019-07-10 ENCOUNTER — Ambulatory Visit (HOSPITAL_BASED_OUTPATIENT_CLINIC_OR_DEPARTMENT_OTHER): Payer: Medicare HMO | Admitting: Genetic Counselor

## 2019-07-10 ENCOUNTER — Ambulatory Visit: Payer: Medicare HMO | Admitting: Physical Therapy

## 2019-07-10 ENCOUNTER — Encounter: Payer: Self-pay | Admitting: Genetic Counselor

## 2019-07-10 DIAGNOSIS — D0591 Unspecified type of carcinoma in situ of right breast: Secondary | ICD-10-CM | POA: Diagnosis not present

## 2019-07-10 DIAGNOSIS — E119 Type 2 diabetes mellitus without complications: Secondary | ICD-10-CM | POA: Diagnosis not present

## 2019-07-10 DIAGNOSIS — D0511 Intraductal carcinoma in situ of right breast: Secondary | ICD-10-CM | POA: Diagnosis not present

## 2019-07-10 DIAGNOSIS — I10 Essential (primary) hypertension: Secondary | ICD-10-CM | POA: Insufficient documentation

## 2019-07-10 DIAGNOSIS — Z79899 Other long term (current) drug therapy: Secondary | ICD-10-CM | POA: Diagnosis not present

## 2019-07-10 DIAGNOSIS — Z7982 Long term (current) use of aspirin: Secondary | ICD-10-CM | POA: Diagnosis not present

## 2019-07-10 DIAGNOSIS — Z90710 Acquired absence of both cervix and uterus: Secondary | ICD-10-CM | POA: Insufficient documentation

## 2019-07-10 DIAGNOSIS — Z7984 Long term (current) use of oral hypoglycemic drugs: Secondary | ICD-10-CM | POA: Insufficient documentation

## 2019-07-10 DIAGNOSIS — Z803 Family history of malignant neoplasm of breast: Secondary | ICD-10-CM | POA: Diagnosis not present

## 2019-07-10 DIAGNOSIS — Z170 Estrogen receptor positive status [ER+]: Secondary | ICD-10-CM | POA: Diagnosis not present

## 2019-07-10 LAB — CBC WITH DIFFERENTIAL (CANCER CENTER ONLY)
Abs Immature Granulocytes: 0.01 10*3/uL (ref 0.00–0.07)
Basophils Absolute: 0 10*3/uL (ref 0.0–0.1)
Basophils Relative: 1 %
Eosinophils Absolute: 0.1 10*3/uL (ref 0.0–0.5)
Eosinophils Relative: 2 %
HCT: 41.5 % (ref 36.0–46.0)
Hemoglobin: 13.1 g/dL (ref 12.0–15.0)
Immature Granulocytes: 0 %
Lymphocytes Relative: 36 %
Lymphs Abs: 1.9 10*3/uL (ref 0.7–4.0)
MCH: 27.1 pg (ref 26.0–34.0)
MCHC: 31.6 g/dL (ref 30.0–36.0)
MCV: 85.7 fL (ref 80.0–100.0)
Monocytes Absolute: 0.4 10*3/uL (ref 0.1–1.0)
Monocytes Relative: 7 %
Neutro Abs: 3 10*3/uL (ref 1.7–7.7)
Neutrophils Relative %: 54 %
Platelet Count: 329 10*3/uL (ref 150–400)
RBC: 4.84 MIL/uL (ref 3.87–5.11)
RDW: 13.2 % (ref 11.5–15.5)
WBC Count: 5.4 10*3/uL (ref 4.0–10.5)
nRBC: 0 % (ref 0.0–0.2)

## 2019-07-10 LAB — CMP (CANCER CENTER ONLY)
ALT: 10 U/L (ref 0–44)
AST: 11 U/L — ABNORMAL LOW (ref 15–41)
Albumin: 3.7 g/dL (ref 3.5–5.0)
Alkaline Phosphatase: 70 U/L (ref 38–126)
Anion gap: 13 (ref 5–15)
BUN: 17 mg/dL (ref 8–23)
CO2: 20 mmol/L — ABNORMAL LOW (ref 22–32)
Calcium: 10.4 mg/dL — ABNORMAL HIGH (ref 8.9–10.3)
Chloride: 105 mmol/L (ref 98–111)
Creatinine: 1.24 mg/dL — ABNORMAL HIGH (ref 0.44–1.00)
GFR, Est AFR Am: 50 mL/min — ABNORMAL LOW (ref >60)
GFR, Estimated: 43 mL/min — ABNORMAL LOW (ref >60)
Glucose, Bld: 241 mg/dL — ABNORMAL HIGH (ref 70–99)
Potassium: 3.9 mmol/L (ref 3.5–5.1)
Sodium: 138 mmol/L (ref 135–145)
Total Bilirubin: 0.6 mg/dL (ref 0.3–1.2)
Total Protein: 7.3 g/dL (ref 6.5–8.1)

## 2019-07-10 NOTE — Progress Notes (Signed)
REFERRING PROVIDER: Nicholas Lose, MD [address] [place],  [place] [address]  PRIMARY PROVIDER:  [name], MD  PRIMARY REASON FOR VISIT:  1. Ductal carcinoma in situ (DCIS) of right breast   2. Family history of breast cancer    I connected with Susan Miles on 07/10/2019 at 11:00 am EDT by [facility] video conference and verified that I am speaking with the correct person using two identifiers.   Patient location: clinic Provider location: clinic   HISTORY OF PRESENT ILLNESS:   Susan Miles, a 73 y.o. female, was seen for a [place] cancer genetics consultation at the request of Dr. Lindi Adie due to a personal and family history of breast cancer.  Susan Miles presents to clinic today to discuss the possibility of a hereditary predisposition to cancer, genetic testing, and to further clarify her future cancer risks, as well as potential cancer risks for family members.   In 2020, at the age of 73, Susan Miles was diagnosed with DCIS of the right breast. The treatment plan includes surgery, radiation, and antiestrogen therapy.   CANCER HISTORY:  Oncology History  Ductal carcinoma in situ (DCIS) of right breast  07/04/2019 Initial Diagnosis   Routine screening mammogram detected 1.6cm mass in the right breast at the 9 o'clock position, loosely group calcifications in the upper right breast spanning 3.6cm, and no axillary adenopathy. Biopsy showed intermediate grade DCIS, ER 100%, PR 100%.    07/10/2019 Cancer Staging   Staging form: Breast, AJCC 8th Edition - Clinical stage from 07/10/2019: Stage 0 (cTis (DCIS), cN0, cM0, ER+, PR+, HER2: Not Assessed) - Signed by Nicholas Lose, MD on 07/10/2019      RISK FACTORS:  Menarche was at age 37.  First live birth at age 17.  OCP use for approximately 1 year.  Ovaries intact: yes.  Hysterectomy: yes.  Menopausal status: postmenopausal.  HRT use: 3 months Colonoscopy: yes; "polyps found once". Mammogram within the last year: yes.  Number of breast biopsies: 1.   Past Medical History:  Diagnosis Date  . Diabetes mellitus without complication ([place])   . Family history of breast cancer   . Hypertension     Past Surgical History:  Procedure Laterality Date  . ABDOMINAL HYSTERECTOMY      Social History   Socioeconomic History  . Marital status: Married    Spouse name: Not on file  . Number of children: Not on file  . Years of education: Not on file  . Highest education level: Not on file  Occupational History  . Not on file  Social Needs  . Financial resource strain: Not on file  . Food insecurity    Worry: Not on file    Inability: Not on file  . Transportation needs    Medical: Not on file    Non-medical: Not on file  Tobacco Use  . Smoking status: Never Smoker  . Smokeless tobacco: Never Used  Substance and Sexual Activity  . Alcohol use: No  . Drug use: No  . Sexual activity: Not on file  Lifestyle  . Physical activity    Days per week: Not on file    Minutes per session: Not on file  . Stress: Not on file  Relationships  . Social Herbalist on phone: Not on file    Gets together: Not on file    Attends religious service: Not on file    Active member of club or organization: Not on file  Attends meetings of clubs or organizations: Not on file    Relationship status: Not on file  Other Topics Concern  . Not on file  Social History Narrative  . Not on file     FAMILY HISTORY:  We obtained a detailed, 4-generation family history.  Significant diagnoses are listed below: Family History  Problem Relation Age of Onset  . Hypertension Mother   . Heart disease Father   . Breast cancer Sister        diagnosed late 4s  . Breast cancer Sister        diagnosed 50s     Susan Miles has one son and two daughters. She has four brothers and six sisters. Two brothers and two sisters are currently living. Of her sisters, two of them were diagnosed with breast cancer, one in her  late 80s and the other in her 38s.   Susan Miles mother died at age 32 but did not have cancer. Among her maternal relatives, there are no known diagnoses of cancer.  Susan Miles father died at age 56 and did not have cancer. She did not have paternal aunts or uncles, and her paternal grandparents did not have cancer to her knowledge.  Susan Miles is unaware of previous family history of genetic testing for hereditary cancer risks. Patient's maternal ancestors are of African American descent, and paternal ancestors are of African American descent. There is no reported Ashkenazi Jewish ancestry. There is no known consanguinity.  GENETIC COUNSELING ASSESSMENT: Susan Miles is a 73 y.o. female with a personal and family history of breast cancer which is somewhat suggestive of a hereditary cancer syndrome and predisposition to cancer. We, therefore, discussed and recommended the following at today's visit.   DISCUSSION: We discussed that 5 - 10% of breast cancer is hereditary, with most cases associated with BRCA1/2.  There are other genes that can be associated with hereditary breast cancer syndromes.  These include ATM, CHEK2, PALB2, etc. We discussed that testing is beneficial for several reasons including knowing about other cancer risks, identifying potential screening and risk-reduction options that may be appropriate, and to understand if other family members could be at risk for cancer and allow them to undergo genetic testing.   We reviewed the characteristics, features and inheritance patterns of hereditary cancer syndromes. We also discussed genetic testing, including the appropriate family members to test, the process of testing, insurance coverage and turn-around-time for results. Although Ms. Gu meets medical criteria for genetic testing based on her personal and family history of breast cancer, she did not wish to pursue genetic testing at today's visit.  PLAN: Despite our recommendation,  Susan Miles did not wish to pursue genetic testing at today's visit. We understand this decision and remain available to coordinate genetic testing at any time in the future. We, therefore, recommend Susan Miles continue to follow the cancer screening guidelines given by her primary healthcare provider.  Susan Miles questions were answered to her satisfaction today. Our contact information was provided should additional questions or concerns arise. Thank you for the referral and allowing [place] to share in the care of your patient.   Clint Guy, MS Genetic Counselor [email or web address] Phone: [phone]   The patient was seen for a total of 15 minutes in face-to-face genetic counseling.  This patient was discussed with Drs. Magrinat, Lindi Adie and/or Burr Medico who agrees with the above.    _______________________________________________________________________ For Office Staff:  Number of people involved in session: 1 Was an Psychologist, prison and probation services  involved with case: no

## 2019-07-10 NOTE — Progress Notes (Signed)
Radiation Oncology         [phone] ________________________________  Name: Susan Miles        MRN: [id number]  Date of Service: 07/10/2019 DOB: [birth year]  TK:[id number], Ranell Patrick, MD  Alphonsa Overall, MD     REFERRING PHYSICIAN: Alphonsa Overall, MD   DIAGNOSIS: The encounter diagnosis was Ductal carcinoma in situ (DCIS) of right breast.   HISTORY OF PRESENT ILLNESS: Susan Miles is a 73 y.o. female seen in the multidisciplinary breast clinic for a new diagnosis of right breast cancer. The patient was noted to have a screening detected abnormality in the right breast on mammogram. She had targeted ultrasoundon 06/26/2019 that revealed a .6 x .4 x 1.6 cm mass at 9:00, and her axilla was negative for adenopathy. There were also calcifications that measured 3.6 cm. A biopsy of both sites on 06/28/2019 revealed an intermediate grade DCIS with calcifications and her biopsies were ER/PR positive. She is seen today to discuss treatment recommendations.    PREVIOUS RADIATION THERAPY: No   PAST MEDICAL HISTORY:  Past Medical History:  Diagnosis Date   Diabetes mellitus without complication ([place])    Hypertension        PAST SURGICAL HISTORY: Past Surgical History:  Procedure Laterality Date   ABDOMINAL HYSTERECTOMY       FAMILY HISTORY:  Family History  Problem Relation Age of Onset   Hypertension Mother    Heart disease Father      SOCIAL HISTORY:  reports that she has never smoked. She has never used smokeless tobacco. She reports that she does not drink alcohol or use drugs. The patient is married and lives in [place]. She is retired from [employer], educational.   ALLERGIES: Ace inhibitors   MEDICATIONS:  Current Outpatient Medications  Medication Sig Dispense Refill   acetaminophen (TYLENOL) 500 MG tablet Take 500 mg by mouth every 6 (six) hours as needed for mild pain.     amLODipine (NORVASC) 10 MG tablet Take 1 tablet (10 mg total) by mouth daily. 90 tablet  1   aspirin 81 MG tablet Take 81 mg by mouth daily.     atorvastatin (LIPITOR) 20 MG tablet Take 1 tablet (20 mg total) by mouth daily at 6 PM. 90 tablet 1   Blood Glucose Monitoring Suppl (LDR BLOOD GLUCOSE TRUETEST) w/Device KIT 1 Device by Does not apply route daily. Test blood sugar once daily. Dx: E11.9 1 kit 0   cetirizine (ZYRTEC) 10 MG tablet Take 1 tablet (10 mg total) by mouth daily. 90 tablet 1   cloNIDine (CATAPRES) 0.1 MG tablet Take 1 tablet (0.1 mg total) by mouth 2 (two) times daily. 180 tablet 1   cloNIDine (CATAPRES) 0.2 MG tablet TAKE 1 TABLET (0.2 MG TOTAL) BY MOUTH 2 TIMES DAILY 180 tablet 1   dapagliflozin propanediol (FARXIGA) 10 MG TABS tablet Take 10 mg by mouth daily. 90 tablet 1   fluticasone (FLONASE) 50 MCG/ACT nasal spray Place 1-2 sprays into both nostrils daily. 16 g 6   glipiZIDE (GLUCOTROL) 10 MG tablet TAKE 2 TABLETS (20 MG TOTAL) BY MOUTH 2 (TWO) TIMES DAILY BEFORE A MEAL. 360 tablet 1   glucose blood (ACCU-CHEK AVIVA PLUS) test strip USE TO TEST BLOOD SUGAR ONCE DAILY. DX E11.9 100 each 1   Lancets MISC Test blood sugar once daily. Dx: E11.9 100 each 3   losartan (COZAAR) 100 MG tablet Take 1 tablet (100 mg total) by mouth daily. 90 tablet 1   metFORMIN (GLUCOPHAGE)  1000 MG tablet Take 1 tablet (1,000 mg total) by mouth 2 (two) times daily with a meal. 180 tablet 1   metoprolol (TOPROL-XL) 200 MG 24 hr tablet TAKE 1 TABLET (200 MG TOTAL) BY MOUTH DAILY. 90 tablet 1   No current facility-administered medications for this encounter.      REVIEW OF SYSTEMS: On review of systems, the patient reports that she is doing well overall. She denies any chest pain, shortness of breath, cough, fevers, chills, night sweats, unintended weight changes. She denies any bowel or bladder disturbances, and denies abdominal pain, nausea or vomiting. She denies any new musculoskeletal or joint aches or pains. A complete review of systems is obtained and is otherwise  negative.     PHYSICAL EXAM:  Wt Readings from Last 3 Encounters:  07/10/19 189 lb 3.2 oz (85.8 kg)  02/07/19 194 lb 12.8 oz (88.4 kg)  10/08/18 197 lb 3.2 oz (89.4 kg)   Temp Readings from Last 3 Encounters:  07/10/19 98 F (36.7 C) (Oral)  05/09/19 97.9 F (36.6 C) (Oral)  02/07/19 98.8 F (37.1 C) (Oral)   BP Readings from Last 3 Encounters:  07/10/19 131/61  02/07/19 134/74  10/08/18 126/70   Pulse Readings from Last 3 Encounters:  07/10/19 73  05/09/19 77  02/07/19 69     In general this is a well appearing African American female in no acute distress. She's alert and oriented x4 and appropriate throughout the examination. Cardiopulmonary assessment is negative for acute distress and she exhibits normal effort. Breast exam is deferred.   ECOG = 0  0 - Asymptomatic (Fully active, able to carry on all predisease activities without restriction)  1 - Symptomatic but completely ambulatory (Restricted in physically strenuous activity but ambulatory and able to carry out work of a light or sedentary nature. For example, light housework, office work)  2 - Symptomatic, <50% in bed during the day (Ambulatory and capable of all self care but unable to carry out any work activities. Up and about more than 50% of waking hours)  3 - Symptomatic, >50% in bed, but not bedbound (Capable of only limited self-care, confined to bed or chair 50% or more of waking hours)  4 - Bedbound (Completely disabled. Cannot carry on any self-care. Totally confined to bed or chair)  5 - Death   Eustace Pen MM, Creech RH, Tormey DC, et al. [phone]). "Toxicity and response criteria of the [facility]". [place] Oncol. 5 (6): 649-55    LABORATORY DATA:  Lab Results  Component Value Date   WBC 5.4 07/10/2019   HGB 13.1 07/10/2019   HCT 41.5 07/10/2019   MCV 85.7 07/10/2019   PLT 329 07/10/2019   Lab Results  Component Value Date   NA 138 07/10/2019   K 3.9 07/10/2019    CL 105 07/10/2019   CO2 20 (L) 07/10/2019   Lab Results  Component Value Date   ALT 10 07/10/2019   AST 11 (L) 07/10/2019   ALKPHOS 70 07/10/2019   BILITOT 0.6 07/10/2019      RADIOGRAPHY: US Breast Ltd Uni Right Inc Axilla  Result Date: 06/26/2019 CLINICAL DATA:  Patient was called back from screening mammogram for a possible mass and asymmetry in the right breast as well as calcifications. EXAM: DIGITAL DIAGNOSTIC RIGHT MAMMOGRAM WITH CAD AND TOMO ULTRASOUND RIGHT BREAST COMPARISON:  Previous exam(s). ACR Breast Density Category b: There are scattered areas of fibroglandular density. FINDINGS: Additional imaging of the right  breast was performed. There is a persistent developed asymmetry in the lateral aspect of the right breast. In the upper right breast there are loosely grouped calcifications in a linear distribution spanning an area of 3.6 cm. There is no associated mass. Mammographic images were processed with CAD. On physical exam, I do not palpate a mass in the lateral aspect of the right breast. Targeted ultrasound is performed, showing there is a bilobed hypoechoic mass in the right breast at 9 o'clock 7 cm from the nipple measuring 0.6 x 0.4 x 1.6 cm correlating with the mammographic abnormality. Sonographic evaluation of the right axilla does not show any enlarged adenopathy. IMPRESSION: Suspicious mass/asymmetry in the 9 o'clock region of the right breast and suspicious calcifications in the upper aspect of the right breast. RECOMMENDATION: Stereotactic biopsies of the mass/asymmetry (better seen mammographically) and calcifications in the right breast is recommended. The biopsies will be scheduled at the patient's convenience. I have discussed the findings and recommendations with the patient. Results were also provided in writing at the conclusion of the visit. If applicable, a reminder letter will be sent to the patient regarding the next appointment. BI-RADS CATEGORY  4:  Suspicious. Electronically Signed   By: Lillia Mountain M.D.   On: 06/26/2019 10:42   Mm Diag Breast Tomo Uni Right  Result Date: 06/26/2019 CLINICAL DATA:  Patient was called back from screening mammogram for a possible mass and asymmetry in the right breast as well as calcifications. EXAM: DIGITAL DIAGNOSTIC RIGHT MAMMOGRAM WITH CAD AND TOMO ULTRASOUND RIGHT BREAST COMPARISON:  Previous exam(s). ACR Breast Density Category b: There are scattered areas of fibroglandular density. FINDINGS: Additional imaging of the right breast was performed. There is a persistent developed asymmetry in the lateral aspect of the right breast. In the upper right breast there are loosely grouped calcifications in a linear distribution spanning an area of 3.6 cm. There is no associated mass. Mammographic images were processed with CAD. On physical exam, I do not palpate a mass in the lateral aspect of the right breast. Targeted ultrasound is performed, showing there is a bilobed hypoechoic mass in the right breast at 9 o'clock 7 cm from the nipple measuring 0.6 x 0.4 x 1.6 cm correlating with the mammographic abnormality. Sonographic evaluation of the right axilla does not show any enlarged adenopathy. IMPRESSION: Suspicious mass/asymmetry in the 9 o'clock region of the right breast and suspicious calcifications in the upper aspect of the right breast. RECOMMENDATION: Stereotactic biopsies of the mass/asymmetry (better seen mammographically) and calcifications in the right breast is recommended. The biopsies will be scheduled at the patient's convenience. I have discussed the findings and recommendations with the patient. Results were also provided in writing at the conclusion of the visit. If applicable, a reminder letter will be sent to the patient regarding the next appointment. BI-RADS CATEGORY  4: Suspicious. Electronically Signed   By: Lillia Mountain M.D.   On: 06/26/2019 10:42   Mm 3d Screen Breast Bilateral  Result Date:  06/21/2019 CLINICAL DATA:  Screening. EXAM: DIGITAL SCREENING BILATERAL MAMMOGRAM WITH TOMO AND CAD COMPARISON:  Previous exam(s). ACR Breast Density Category b: There are scattered areas of fibroglandular density. FINDINGS: In the right breast, a possible mass and an asymmetry warrants further evaluation. In the left breast, no findings suspicious for malignancy. Images were processed with CAD. IMPRESSION: Further evaluation is suggested for possible mass and an asymmetry in the right breast. RECOMMENDATION: Diagnostic mammogram and possibly ultrasound of the right breast. (Code:[id number])  The patient will be contacted regarding the findings, and additional imaging will be scheduled. BI-RADS CATEGORY  0: Incomplete. Need additional imaging evaluation and/or prior mammograms for comparison. Electronically Signed   By: Ammie Ferrier M.D.   On: 06/21/2019 16:54   Mm Clip Placement Right  Result Date: 06/28/2019 CLINICAL DATA:  Status post stereotactic biopsies of an asymmetry and calcifications in the right breast. EXAM: 3D DIAGNOSTIC RIGHT MAMMOGRAM POST STEREOTACTIC BIOPSIES COMPARISON:  Previous exam(s). FINDINGS: 3D Mammographic images were obtained following stereotactic guided biopsies of the right breast. Mammographic images show there is an X shaped clip in the upper-outer quadrant of the right breast in appropriate position and a coil shaped clip in the 12 o'clock region of the right breast in appropriate position. IMPRESSION: Status post stereotactic biopsies of the right breast with pathology pending. Final Assessment: Post Procedure Mammograms for Marker Placement Electronically Signed   By: Lillia Mountain M.D.   On: 06/28/2019 11:34   Mm Rt Breast Bx W Loc Dev 1st Lesion Image Bx Spec Stereo Guide  Addendum Date: 07/08/2019   ADDENDUM REPORT: 07/02/2019 13:43 ADDENDUM: Pathology revealed INTERMEDIATE GRADE DUCTAL CARCINOMA IN SITU WITH CALCIFICATIONS of the Right breast, both locations, upper outer  quadrant and upper. This was found to be concordant by Dr. Lillia Mountain. Pathology results were discussed with the patient by telephone. The patient reported doing well after the biopsies with tenderness at the sites. Post biopsy instructions and care were reviewed and questions were answered. The patient was encouraged to call [facility] for any additional concerns. The patient was referred to [facility] on July 10, 2019. Pathology results reported by Terie Purser, RN on 07/02/2019. Electronically Signed   By: Lillia Mountain M.D.   On: 07/02/2019 13:43   Result Date: 07/08/2019 CLINICAL DATA:  Suspicious mass and asymmetry in the right breast. EXAM: RIGHT BREAST STEREOTACTIC CORE NEEDLE BIOPSIES COMPARISON:  Previous exams. FINDINGS: The patient and I discussed the procedure of stereotactic-guided biopsy including benefits and alternatives. We discussed the high likelihood of a successful procedure. We discussed the risks of the procedure including infection, bleeding, tissue injury, clip migration, and inadequate sampling. Informed written consent was given. The usual time out protocol was performed immediately prior to the procedure. Using sterile technique and 1% lidocaine and 1% lidocaine with epinephrine as local anesthetic, under stereotactic guidance, a 9 gauge vacuum assisted device was used to perform core needle biopsy of asymmetry in the upper-outer quadrant of the right breast using a superior to inferior approach. Lesion quadrant: Upper-outer quadrant At the conclusion of the procedure, a X shaped tissue marker clip was deployed into the biopsy cavity. Follow-up 2-view mammogram was performed and dictated separately. The patient and I discussed the procedure of stereotactic-guided biopsy including benefits and alternatives. We discussed the high likelihood of a successful procedure. We discussed the  risks of the procedure including infection, bleeding, tissue injury, clip migration, and inadequate sampling. Informed written consent was given. The usual time out protocol was performed immediately prior to the procedure. Using sterile technique and 1% lidocaine and 1% lidocaine with epinephrine as local anesthetic, under stereotactic guidance, a 9 gauge vacuum assisted device was used to perform core needle biopsy of calcifications in the 12 o'clock region of the right breast using a superior to inferior approach. Specimen radiograph was performed showing calcifications are present in the tissue samples. Specimens with calcifications are identified  for pathology. Lesion quadrant: 12 o'clock At the conclusion of the procedure, a coil shaped tissue marker clip was deployed into the biopsy cavity. Follow-up 2-view mammogram was performed and dictated separately. IMPRESSION: Stereotactic-guided biopsies of an asymmetry and calcifications in the right breast. No apparent complications. Electronically Signed: By: Lillia Mountain M.D. On: 06/28/2019 11:33   Mm Rt Breast Bx W Loc Dev Ea Ad Lesion Img Bx Spec Stereo Guide  Addendum Date: 07/08/2019   ADDENDUM REPORT: 07/02/2019 13:43 ADDENDUM: Pathology revealed INTERMEDIATE GRADE DUCTAL CARCINOMA IN SITU WITH CALCIFICATIONS of the Right breast, both locations, upper outer quadrant and upper. This was found to be concordant by Dr. Lillia Mountain. Pathology results were discussed with the patient by telephone. The patient reported doing well after the biopsies with tenderness at the sites. Post biopsy instructions and care were reviewed and questions were answered. The patient was encouraged to call [facility] for any additional concerns. The patient was referred to [facility] on July 10, 2019. Pathology results reported by Terie Purser, RN on 07/02/2019. Electronically  Signed   By: Lillia Mountain M.D.   On: 07/02/2019 13:43   Result Date: 07/08/2019 CLINICAL DATA:  Suspicious mass and asymmetry in the right breast. EXAM: RIGHT BREAST STEREOTACTIC CORE NEEDLE BIOPSIES COMPARISON:  Previous exams. FINDINGS: The patient and I discussed the procedure of stereotactic-guided biopsy including benefits and alternatives. We discussed the high likelihood of a successful procedure. We discussed the risks of the procedure including infection, bleeding, tissue injury, clip migration, and inadequate sampling. Informed written consent was given. The usual time out protocol was performed immediately prior to the procedure. Using sterile technique and 1% lidocaine and 1% lidocaine with epinephrine as local anesthetic, under stereotactic guidance, a 9 gauge vacuum assisted device was used to perform core needle biopsy of asymmetry in the upper-outer quadrant of the right breast using a superior to inferior approach. Lesion quadrant: Upper-outer quadrant At the conclusion of the procedure, a X shaped tissue marker clip was deployed into the biopsy cavity. Follow-up 2-view mammogram was performed and dictated separately. The patient and I discussed the procedure of stereotactic-guided biopsy including benefits and alternatives. We discussed the high likelihood of a successful procedure. We discussed the risks of the procedure including infection, bleeding, tissue injury, clip migration, and inadequate sampling. Informed written consent was given. The usual time out protocol was performed immediately prior to the procedure. Using sterile technique and 1% lidocaine and 1% lidocaine with epinephrine as local anesthetic, under stereotactic guidance, a 9 gauge vacuum assisted device was used to perform core needle biopsy of calcifications in the 12 o'clock region of the right breast using a superior to inferior approach. Specimen radiograph was performed showing calcifications are present in the tissue  samples. Specimens with calcifications are identified for pathology. Lesion quadrant: 12 o'clock At the conclusion of the procedure, a coil shaped tissue marker clip was deployed into the biopsy cavity. Follow-up 2-view mammogram was performed and dictated separately. IMPRESSION: Stereotactic-guided biopsies of an asymmetry and calcifications in the right breast. No apparent complications. Electronically Signed: By: Lillia Mountain M.D. On: 06/28/2019 11:33       IMPRESSION/PLAN: 1. Intermediate grade ER/PR positive DCIS of the right breast. Dr. Lisbeth Renshaw discusses the pathology findings and reviews the nature of noninvasive right breast disease. The consensus from the breast conference includes MRI of the breast and possible breast conservation with bracketed lumpectomy  versus mastectomy.  She is interested in double lumpectomy. She would benefit from external radiotherapy to the breast as she plans for breast conservation followed by antiestrogen therapy. We discussed the risks, benefits, short, and long term effects of radiotherapy, and the patient is interested in proceeding. Dr. Lisbeth Renshaw discusses the delivery and logistics of radiotherapy and anticipates a course of 4-6 1/2 weeks of radiotherapy, though she would most likely be a candidate for the 4 week course. We will follow up with her surgical decision making and proceed with radiotherapy as indicated. 2. Possible genetic predisposition to malignancy. The patient is a candidate for genetic testing given her personal and family history. She was offered referral and is going to be set up to discuss genetics today.   In a visit lasting 30 minutes, greater than 50% of the time was spent face to face discussing her, and coordinating the patient's care.  The above documentation reflects my direct findings during this shared patient visit. Please see the separate note by Dr. Lisbeth Renshaw on this date for the remainder of the patient's plan of care.    Carola Rhine, PAC

## 2019-07-10 NOTE — Assessment & Plan Note (Signed)
07/04/2019:Routine screening mammogram detected 1.6cm mass in the right breast at the 9 o'clock position, loosely group calcifications in the upper right breast spanning 3.6cm, and no axillary adenopathy. Biopsy showed intermediate grade DCIS, ER 100%, PR 100%.  Tix Nx Stage 0  Pathology review: I discussed with the patient the difference between DCIS and invasive breast cancer. It is considered a precancerous lesion. DCIS is classified as a 0. It is generally detected through mammograms as calcifications. We discussed the significance of grades and its impact on prognosis. We also discussed the importance of ER and PR receptors and their implications to adjuvant treatment options. Prognosis of DCIS dependence on grade, comedo necrosis. It is anticipated that if not treated, 20-30% of DCIS can develop into invasive breast cancer.  Recommendation: 1. Breast conserving surgery 2. Followed by adjuvant radiation therapy 3. Followed by antiestrogen therapy with tamoxifen 5 years  Tamoxifen counseling: We discussed the risks and benefits of tamoxifen. These include but not limited to insomnia, hot flashes, mood changes, vaginal dryness, and weight gain. Although rare, serious side effects including endometrial cancer, risk of blood clots were also discussed. We strongly believe that the benefits far outweigh the risks. Patient understands these risks and consented to starting treatment. Planned treatment duration is 5 years.  Return to clinic after surgery to discuss the final pathology report and come up with an adjuvant treatment plan.

## 2019-07-11 ENCOUNTER — Telehealth: Payer: Self-pay | Admitting: Family Medicine

## 2019-07-11 NOTE — Telephone Encounter (Signed)
Patient would not tell me the issue said it was private and needs to speak to Dr. Carlota Raspberry nurse asap [phone]

## 2019-07-12 ENCOUNTER — Other Ambulatory Visit (HOSPITAL_COMMUNITY): Payer: Self-pay | Admitting: Surgery

## 2019-07-12 DIAGNOSIS — D0591 Unspecified type of carcinoma in situ of right breast: Secondary | ICD-10-CM

## 2019-07-15 ENCOUNTER — Ambulatory Visit: Payer: Self-pay

## 2019-07-15 ENCOUNTER — Telehealth: Payer: Self-pay | Admitting: Hematology and Oncology

## 2019-07-15 ENCOUNTER — Other Ambulatory Visit: Payer: Self-pay | Admitting: *Deleted

## 2019-07-15 DIAGNOSIS — D0511 Intraductal carcinoma in situ of right breast: Secondary | ICD-10-CM

## 2019-07-15 NOTE — Telephone Encounter (Signed)
Scheduled appt per 8/10 sch message - pt aware of appt added and reminder letter mailed with appt date and time

## 2019-07-17 ENCOUNTER — Telehealth: Payer: Self-pay

## 2019-07-17 NOTE — Telephone Encounter (Signed)
Nutrition Assessment  Reason for Assessment:  Pt attended Breast Clinic on 8/5 and nutrition packet was given by nurse navigator.  ASSESSMENT:  73 year old female with new diagnosis of DCIS of right breast.  Planning breast conserving surgery, adjuvant XRT and antiestrogens.  Past medical history of DM, HTN.  Spoke with patient via phone to introduce self and service at [facility].  Patient reports good/normal appetite  Medications:  reviewed  Labs: reviewed  Anthropometrics:   Height: 63 inches Weight: 189 lb BMI: 33  Stable weight   NUTRITION DIAGNOSIS: Food and nutrition related knowledge deficit related to new diagnosis of breast cancer as evidenced by no prior need for nutrition related information.  INTERVENTION:   Discussed briefly packet of information regarding nutritional tips for breast cancer patients.  Questions answered.   Contact information provided and patient knows to contact me with questions/concerns.    MONITORING, EVALUATION, and GOAL: Pt will consume a healthy plant based diet to maintain lean body mass throughout treatment.   Susan Miles, [place], [place] Registered Dietitian [phone] (pager)

## 2019-07-17 NOTE — Telephone Encounter (Signed)
Requesting call

## 2019-07-18 ENCOUNTER — Telehealth: Payer: Self-pay | Admitting: *Deleted

## 2019-07-18 NOTE — Telephone Encounter (Signed)
Called and spoke with patient to follow up from [facility].  No needs or concerns at this time.  Encouraged to call if anything arises.

## 2019-07-22 ENCOUNTER — Other Ambulatory Visit: Payer: Self-pay | Admitting: Family Medicine

## 2019-07-24 NOTE — Telephone Encounter (Signed)
Patient canceled her 3 month appt. Due to she have unexpected dr appt for breast. They found 2 spots in right breast. Sept 4 the is the day of the surgery.

## 2019-07-30 ENCOUNTER — Encounter: Payer: Self-pay | Admitting: *Deleted

## 2019-07-30 NOTE — Progress Notes (Signed)
Clinical Social Work [place] Psychosocial Distress Screening [place]  Patient completed distress screening protocol and scored a 5 on the Psychosocial Distress Thermometer which indicates moderate distress. Clinical Social Worker contacted patient after [facility] to assess for distress and other psychosocial needs. Patient stated she felt "better" after meeting with the treatment team and getting more information on her treatment plan. CSW and patient discussed common feeling and emotions when being diagnosed with cancer, and the importance of support during treatment. CSW informed patient of the support team and support services at [facility]. CSW provided contact information and encouraged patient to call with any questions or concerns.  ONCBCN DISTRESS SCREENING 07/30/2019  Screening Type Initial Screening  Distress experienced in past week (1-10) 5  Emotional problem type Adjusting to illness;Feeling hopeless  Spiritual/Religous concerns type Relating to God  Information Concerns Type Lack of info about treatment  Physical Problem type Sleep/insomnia  Physician notified of physical symptoms Yes     Johnnye Lana, MSW, LCSW, OSW-C Clinical Social Worker [place] [phone]

## 2019-08-02 ENCOUNTER — Encounter (HOSPITAL_BASED_OUTPATIENT_CLINIC_OR_DEPARTMENT_OTHER): Payer: Self-pay | Admitting: *Deleted

## 2019-08-02 ENCOUNTER — Other Ambulatory Visit: Payer: Self-pay

## 2019-08-05 ENCOUNTER — Telehealth: Payer: Self-pay | Admitting: Family Medicine

## 2019-08-05 NOTE — Telephone Encounter (Signed)
Pt is wanting Dr. Vonna Kotyk CMA to know that she had to cancel her appt with Dr. Carlota Raspberry on 08/07/2019 due to having surgery on 08/09/2019 and she has to quarentine. Pt states she will call back to r/s appt.

## 2019-08-06 ENCOUNTER — Other Ambulatory Visit: Payer: Self-pay

## 2019-08-06 ENCOUNTER — Other Ambulatory Visit (HOSPITAL_COMMUNITY)
Admission: RE | Admit: 2019-08-06 | Discharge: 2019-08-06 | Disposition: A | Discharge status: Home / Self Care | Payer: Medicare HMO | Source: Ambulatory Visit | Attending: Surgery | Admitting: Surgery

## 2019-08-06 ENCOUNTER — Encounter (HOSPITAL_BASED_OUTPATIENT_CLINIC_OR_DEPARTMENT_OTHER)
Admission: RE | Admit: 2019-08-06 | Discharge: 2019-08-06 | Disposition: A | Discharge status: Home / Self Care | Payer: Medicare HMO | Source: Ambulatory Visit | Attending: Surgery | Admitting: Surgery

## 2019-08-06 DIAGNOSIS — Z01818 Encounter for other preprocedural examination: Secondary | ICD-10-CM | POA: Insufficient documentation

## 2019-08-06 DIAGNOSIS — Z20828 Contact with and (suspected) exposure to other viral communicable diseases: Secondary | ICD-10-CM | POA: Insufficient documentation

## 2019-08-06 DIAGNOSIS — Z01812 Encounter for preprocedural laboratory examination: Secondary | ICD-10-CM | POA: Diagnosis present

## 2019-08-06 LAB — BASIC METABOLIC PANEL
Anion gap: 11 (ref 5–15)
BUN: 15 mg/dL (ref 8–23)
CO2: 22 mmol/L (ref 22–32)
Calcium: 10.6 mg/dL — ABNORMAL HIGH (ref 8.9–10.3)
Chloride: 105 mmol/L (ref 98–111)
Creatinine, Ser: 1.09 mg/dL — ABNORMAL HIGH (ref 0.44–1.00)
GFR calc Af Amer: 59 mL/min — ABNORMAL LOW (ref >60)
GFR calc non Af Amer: 51 mL/min — ABNORMAL LOW (ref >60)
Glucose, Bld: 173 mg/dL — ABNORMAL HIGH (ref 70–99)
Potassium: 4.2 mmol/L (ref 3.5–5.1)
Sodium: 138 mmol/L (ref 135–145)

## 2019-08-06 LAB — SARS CORONAVIRUS 2 (TAT 6-24 HRS): SARS Coronavirus 2: NEGATIVE

## 2019-08-06 NOTE — Progress Notes (Signed)

## 2019-08-06 NOTE — Telephone Encounter (Signed)
Other message noted.  Has surgery scheduled in 3 days.  Call patient to check status, she is doing well.  No specific needs identified at this time.  All questions answered.

## 2019-08-07 ENCOUNTER — Ambulatory Visit: Payer: Medicare HMO | Admitting: Family Medicine

## 2019-08-08 ENCOUNTER — Other Ambulatory Visit: Payer: Self-pay

## 2019-08-08 ENCOUNTER — Ambulatory Visit
Admission: RE | Admit: 2019-08-08 | Discharge: 2019-08-08 | Disposition: A | Discharge status: Home / Self Care | Payer: Medicare HMO | Source: Ambulatory Visit | Attending: Surgery | Admitting: Surgery

## 2019-08-08 DIAGNOSIS — D0591 Unspecified type of carcinoma in situ of right breast: Secondary | ICD-10-CM

## 2019-08-08 DIAGNOSIS — R921 Mammographic calcification found on diagnostic imaging of breast: Secondary | ICD-10-CM | POA: Diagnosis not present

## 2019-08-08 DIAGNOSIS — D0511 Intraductal carcinoma in situ of right breast: Secondary | ICD-10-CM | POA: Diagnosis not present

## 2019-08-08 NOTE — H&P (Signed)
Susan Miles  Location: [facility] Patient #: [id number] DOB: 1946-09-06 Undefined / Language: Undefined / Race: Black or African American Female  History of Present Illness   The patient is a 73 year old female who presents with a complaint of breast cancer.  The PCP is Dr[name])  The patient was referred by Dr. Aquilla Solian  The pateint is at the [facility] - Oncology is Drs. Lindi Adie and [place] She comes by herself.  [The Covid-19 virus has disrupted normal medical care in [place] and across the nation. We have sometimes had to alter normal surgical/medical care to limit this epidemic and we have explained these changes to the patient.]  She gets annual mammograms. She has had no prior breast biopsy or problem. She is not on hormones. She has 2 sisters with breast cancer.  [facility] - in her right breast she had both an area of Ca++ that measured 3.5 cm (at 12 o'clock) and a mass area that measured 1.6 cm (at 9 o'clock) Biopsy: Right breast biopsy x 2 - 06/28/2019 [id number]) - 1. UOQ right - DCIS, 2. Right uppe - DCIS, ER - 100%, PR - 100% Family history of breast or ovarian cancer: 2 sisters with breast cancer - one died of breast cancer and one died of massive heart attack. She is 1 of 11 children - 6 sisters (3 still living) and 4 brothers (2 still living) On hormone therapy: No.  I discussed the options for breast cancer treatment with the patient. The patient is at the [facility], which includes medical oncology and radiation oncology. I discussed the surgical options of lumpectomy vs. mastectomy. If mastectomy, there is the possibility of reconstruction. I discussed the options of lymph node biopsy. The treatment plan depends on the pathologic staging of the tumor and the patient's personal wishes. The risks of surgery include, but are not limited to,  bleeding, infection, the need for further surgery, and nerve injury. The patient has been given literature on the treatment of breast cancer.  Plan: 1) right breast lumpectomy x 2 (3 seeds - bracket the calcifications), 2) rad tx, 3) antihormone tx, 4) genetics  Past Medical History: 1. HTN 2. DM x 15 years 3. Chronic kidney dysfunction - Dr. Lawson Radar Creatinine - 1.24 - 07/10/2019 4. Colonoscopy in 2019 5. History of gout - but stopped meds due to cost 6. Hysterectomy in the 1980's (she still has her ovaries)  Social History: Married - Charles. I offered to call her husband, but she was okay not calling him. She has 3 children: [place] Cellar - 69 yo, Simonne Martinet - 73 yo (lives in [place]), Adithri Cashell (73 yo)    Medication History Tawni Pummel, RN; 07/10/2019 8:03 AM) Medications Reconciled   Physical Exam General: WN older AA F who isalert and generally healthy appearing. Skin: Inspection and palpation of the skin unremarkable.  Eyes: Conjunctivae white, pupils equal. Face, ears, nose, mouth, and throat: Face - normal. Normal ears and nose. Did not exam partly because of mask.  Neck: Supple. No mass. Trachea midline. No thyroid mass.  Lymph Nodes: No supraclavicular or cervical adenopathy. No axillary adenopathy.  Lungs: Normal respiratory effort. Clear to auscultation and symmetric breath sounds. Cardiovascular: Regular rate and rythm. Normal auscultation of the heart. No murmur or rub.  Breasts: Right - Moderately large breasts. She has a fullness at 10 o'clock - hematoma secondary to biopsy ??? - and she is  tender in this area. No discreet mass. Left - Moderately large breasts. No mass or nodule.  Abdomen: Soft. No mass. Liver and spleen not palpable. No tenderness. No hernia. Normal bowel sounds.  Obese with pannus. Rectal: Not done.  Musculoskeletal/extremities: Normal gait. Good strength and ROM in  upper and lower extremities.   Neurologic: Grossly intact to motor and sensory function.   Psychiatric: Has normal mood and affect. Judgement and insight appear normal.    Assessment & Plan  1.  BREAST CANCER, STAGE 0, RIGHT (D05.91)  Story: Right breast biopsy x 2 - 06/28/2019 [id number]) - 1. UOQ right - DCIS, 2. Right uppe - DCIS, ER - 100%, PR - 100%  Oncology - [facility]/[facility]y  Plan:   1) right breast lumpectomy x 2 (3 seeds - bracket the calcifications),    2) rad tx,   3) antihormone tx,   4) genetics  2. HTN 3. DM x 15 years 4. Chronic kidney dysfunction - Dr. Lawson Radar Creatinine - 1.24 - 07/10/2019 5. History of gout - but stopped meds due to cost    Alphonsa Overall, MD, [facility] Pager: [phone] Office phone:  [phone]

## 2019-08-09 ENCOUNTER — Ambulatory Visit (HOSPITAL_BASED_OUTPATIENT_CLINIC_OR_DEPARTMENT_OTHER): Payer: Medicare HMO | Admitting: Certified Registered"

## 2019-08-09 ENCOUNTER — Ambulatory Visit
Admission: RE | Admit: 2019-08-09 | Discharge: 2019-08-09 | Disposition: A | Discharge status: Home / Self Care | Payer: Medicare HMO | Source: Ambulatory Visit | Attending: Surgery | Admitting: Surgery

## 2019-08-09 ENCOUNTER — Other Ambulatory Visit: Payer: Self-pay

## 2019-08-09 ENCOUNTER — Encounter (HOSPITAL_BASED_OUTPATIENT_CLINIC_OR_DEPARTMENT_OTHER): Payer: Self-pay | Admitting: Certified Registered"

## 2019-08-09 ENCOUNTER — Ambulatory Visit (HOSPITAL_BASED_OUTPATIENT_CLINIC_OR_DEPARTMENT_OTHER)
Admission: RE | Admit: 2019-08-09 | Discharge: 2019-08-09 | Disposition: A | Discharge status: Home / Self Care | Payer: Medicare HMO | Attending: Surgery | Admitting: Surgery

## 2019-08-09 ENCOUNTER — Encounter (HOSPITAL_BASED_OUTPATIENT_CLINIC_OR_DEPARTMENT_OTHER)
Admission: RE | Disposition: A | Discharge status: Home / Self Care | Payer: Self-pay | Source: Home / Self Care | Attending: Surgery

## 2019-08-09 DIAGNOSIS — E785 Hyperlipidemia, unspecified: Secondary | ICD-10-CM | POA: Diagnosis not present

## 2019-08-09 DIAGNOSIS — I129 Hypertensive chronic kidney disease with stage 1 through stage 4 chronic kidney disease, or unspecified chronic kidney disease: Secondary | ICD-10-CM | POA: Insufficient documentation

## 2019-08-09 DIAGNOSIS — I10 Essential (primary) hypertension: Secondary | ICD-10-CM | POA: Diagnosis not present

## 2019-08-09 DIAGNOSIS — R921 Mammographic calcification found on diagnostic imaging of breast: Secondary | ICD-10-CM | POA: Diagnosis not present

## 2019-08-09 DIAGNOSIS — D0591 Unspecified type of carcinoma in situ of right breast: Secondary | ICD-10-CM

## 2019-08-09 DIAGNOSIS — N189 Chronic kidney disease, unspecified: Secondary | ICD-10-CM | POA: Insufficient documentation

## 2019-08-09 DIAGNOSIS — G4730 Sleep apnea, unspecified: Secondary | ICD-10-CM | POA: Diagnosis not present

## 2019-08-09 DIAGNOSIS — E1122 Type 2 diabetes mellitus with diabetic chronic kidney disease: Secondary | ICD-10-CM | POA: Insufficient documentation

## 2019-08-09 DIAGNOSIS — D0511 Intraductal carcinoma in situ of right breast: Secondary | ICD-10-CM | POA: Diagnosis not present

## 2019-08-09 DIAGNOSIS — C50911 Malignant neoplasm of unspecified site of right female breast: Secondary | ICD-10-CM | POA: Diagnosis not present

## 2019-08-09 DIAGNOSIS — Z803 Family history of malignant neoplasm of breast: Secondary | ICD-10-CM | POA: Insufficient documentation

## 2019-08-09 DIAGNOSIS — E119 Type 2 diabetes mellitus without complications: Secondary | ICD-10-CM | POA: Diagnosis not present

## 2019-08-09 DIAGNOSIS — N6091 Unspecified benign mammary dysplasia of right breast: Secondary | ICD-10-CM | POA: Diagnosis not present

## 2019-08-09 HISTORY — DX: Malignant (primary) neoplasm, unspecified: C80.1

## 2019-08-09 HISTORY — DX: Sleep apnea, unspecified: G47.30

## 2019-08-09 HISTORY — PX: BREAST LUMPECTOMY WITH RADIOACTIVE SEED LOCALIZATION: SHX6424

## 2019-08-09 LAB — GLUCOSE, CAPILLARY
Glucose-Capillary: 146 mg/dL — ABNORMAL HIGH (ref 70–99)
Glucose-Capillary: 114 mg/dL — ABNORMAL HIGH (ref 70–99)

## 2019-08-09 SURGERY — BREAST LUMPECTOMY WITH RADIOACTIVE SEED LOCALIZATION
Anesthesia: General | Site: Breast | Laterality: Right

## 2019-08-09 MED ORDER — PROPOFOL 10 MG/ML IV BOLUS
INTRAVENOUS | Status: DC | PRN
  Administered 2019-08-09: 150 mg via INTRAVENOUS

## 2019-08-09 MED ORDER — FENTANYL CITRATE (PF) 100 MCG/2ML IJ SOLN
INTRAMUSCULAR | Status: AC
  Filled 2019-08-09: qty 2

## 2019-08-09 MED ORDER — PROMETHAZINE HCL 25 MG/ML IJ SOLN: 6.2500 mg | INTRAMUSCULAR | Status: DC | PRN

## 2019-08-09 MED ORDER — BUPIVACAINE-EPINEPHRINE 0.25% -1:200000 IJ SOLN
INTRAMUSCULAR | Status: DC | PRN
  Administered 2019-08-09: 30 mL

## 2019-08-09 MED ORDER — 0.9 % SODIUM CHLORIDE (POUR BTL) OPTIME
TOPICAL | Status: DC | PRN
  Administered 2019-08-09: 200 mL

## 2019-08-09 MED ORDER — CHLORHEXIDINE GLUCONATE CLOTH 2 % EX PADS: 6.0000 | MEDICATED_PAD | Freq: Once | CUTANEOUS | Status: DC

## 2019-08-09 MED ORDER — ACETAMINOPHEN 500 MG PO TABS
ORAL_TABLET | ORAL | Status: AC
  Filled 2019-08-09: qty 2

## 2019-08-09 MED ORDER — FENTANYL CITRATE (PF) 100 MCG/2ML IJ SOLN
50.0000 ug | INTRAMUSCULAR | Status: AC | PRN
  Administered 2019-08-09 (×4): 50 ug via INTRAVENOUS

## 2019-08-09 MED ORDER — ONDANSETRON HCL 4 MG/2ML IJ SOLN
INTRAMUSCULAR | Status: DC | PRN
  Administered 2019-08-09: 4 mg via INTRAVENOUS

## 2019-08-09 MED ORDER — GABAPENTIN 300 MG PO CAPS
ORAL_CAPSULE | ORAL | Status: AC
  Filled 2019-08-09: qty 1

## 2019-08-09 MED ORDER — OXYCODONE HCL 5 MG PO TABS: 5.0000 mg | ORAL_TABLET | Freq: Once | ORAL | Status: DC | PRN

## 2019-08-09 MED ORDER — BUPIVACAINE-EPINEPHRINE (PF) 0.25% -1:200000 IJ SOLN
INTRAMUSCULAR | Status: AC
  Filled 2019-08-09: qty 30

## 2019-08-09 MED ORDER — GABAPENTIN 300 MG PO CAPS
300.0000 mg | ORAL_CAPSULE | ORAL | Status: AC
  Administered 2019-08-09: 300 mg via ORAL

## 2019-08-09 MED ORDER — MIDAZOLAM HCL 2 MG/2ML IJ SOLN: 1.0000 mg | INTRAMUSCULAR | Status: DC | PRN

## 2019-08-09 MED ORDER — ACETAMINOPHEN 500 MG PO TABS
1000.0000 mg | ORAL_TABLET | ORAL | Status: AC
  Administered 2019-08-09: 1000 mg via ORAL

## 2019-08-09 MED ORDER — CEFAZOLIN SODIUM-DEXTROSE 2-4 GM/100ML-% IV SOLN
2.0000 g | INTRAVENOUS | Status: AC
  Administered 2019-08-09: 2 g via INTRAVENOUS

## 2019-08-09 MED ORDER — LIDOCAINE HCL (CARDIAC) PF 100 MG/5ML IV SOSY
PREFILLED_SYRINGE | INTRAVENOUS | Status: DC | PRN
  Administered 2019-08-09: 60 mg via INTRAVENOUS

## 2019-08-09 MED ORDER — DEXAMETHASONE SODIUM PHOSPHATE 4 MG/ML IJ SOLN
INTRAMUSCULAR | Status: DC | PRN
  Administered 2019-08-09: 4 mg via INTRAVENOUS

## 2019-08-09 MED ORDER — HYDROCODONE-ACETAMINOPHEN 5-325 MG PO TABS: 1.0000 | ORAL_TABLET | Freq: Four times a day (QID) | ORAL | 0 refills | Status: DC | PRN

## 2019-08-09 MED ORDER — LACTATED RINGERS IV SOLN
INTRAVENOUS | Status: DC
  Administered 2019-08-09 (×2): via INTRAVENOUS

## 2019-08-09 MED ORDER — PROPOFOL 500 MG/50ML IV EMUL
INTRAVENOUS | Status: DC | PRN
  Administered 2019-08-09: 25 ug/kg/min via INTRAVENOUS

## 2019-08-09 MED ORDER — SCOPOLAMINE 1 MG/3DAYS TD PT72: 1.0000 | MEDICATED_PATCH | Freq: Once | TRANSDERMAL | Status: DC

## 2019-08-09 MED ORDER — CEFAZOLIN SODIUM-DEXTROSE 2-4 GM/100ML-% IV SOLN
INTRAVENOUS | Status: AC
  Filled 2019-08-09: qty 100

## 2019-08-09 MED ORDER — OXYCODONE HCL 5 MG/5ML PO SOLN: 5.0000 mg | Freq: Once | ORAL | Status: DC | PRN

## 2019-08-09 MED ORDER — HYDROMORPHONE HCL 1 MG/ML IJ SOLN: 0.2500 mg | INTRAMUSCULAR | Status: DC | PRN

## 2019-08-09 SURGICAL SUPPLY — 61 items
APL PRP STRL LF DISP 70% ISPRP (MISCELLANEOUS) ×1
APL SKNCLS STERI-STRIP NONHPOA (GAUZE/BANDAGES/DRESSINGS)
BENZOIN TINCTURE PRP APPL 2/3 (GAUZE/BANDAGES/DRESSINGS) IMPLANT
BINDER BREAST LRG (GAUZE/BANDAGES/DRESSINGS) IMPLANT
BINDER BREAST MEDIUM (GAUZE/BANDAGES/DRESSINGS) IMPLANT
BINDER BREAST XLRG (GAUZE/BANDAGES/DRESSINGS) ×1 IMPLANT
BINDER BREAST XXLRG (GAUZE/BANDAGES/DRESSINGS) IMPLANT
BLADE HEX COATED 2.75 (ELECTRODE) IMPLANT
BLADE SURG 10 STRL SS (BLADE) ×1 IMPLANT
BLADE SURG 15 STRL LF DISP TIS (BLADE) ×1 IMPLANT
BLADE SURG 15 STRL SS (BLADE) ×1
CANISTER SUC SOCK COL 7IN (MISCELLANEOUS) IMPLANT
CANISTER SUCT 1200ML W/VALVE (MISCELLANEOUS) ×2 IMPLANT
CHLORAPREP W/TINT 26 (MISCELLANEOUS) ×2 IMPLANT
CLIP VESOCCLUDE SM WIDE 6/CT (CLIP) ×2 IMPLANT
COVER BACK TABLE REUSABLE LG (DRAPES) ×2 IMPLANT
COVER MAYO STAND REUSABLE (DRAPES) ×2 IMPLANT
COVER PROBE W GEL 5X96 (DRAPES) ×2 IMPLANT
COVER WAND RF STERILE (DRAPES) IMPLANT
DECANTER SPIKE VIAL GLASS SM (MISCELLANEOUS) ×1 IMPLANT
DERMABOND ADVANCED (GAUZE/BANDAGES/DRESSINGS) ×2
DERMABOND ADVANCED .7 DNX12 (GAUZE/BANDAGES/DRESSINGS) ×1 IMPLANT
DRAPE HALF SHEET 70X43 (DRAPES) IMPLANT
DRAPE LAPAROTOMY 100X72 PEDS (DRAPES) ×2 IMPLANT
DRAPE UTILITY XL STRL (DRAPES) ×2 IMPLANT
DRSG PAD ABDOMINAL 8X10 ST (GAUZE/BANDAGES/DRESSINGS) ×1 IMPLANT
ELECT COATED BLADE 2.86 ST (ELECTRODE) ×2 IMPLANT
ELECT REM PT RETURN 9FT ADLT (ELECTROSURGICAL) ×2
ELECTRODE REM PT RTRN 9FT ADLT (ELECTROSURGICAL) ×1 IMPLANT
GAUZE SPONGE 4X4 12PLY STRL (GAUZE/BANDAGES/DRESSINGS) ×2 IMPLANT
GAUZE SPONGE 4X4 12PLY STRL LF (GAUZE/BANDAGES/DRESSINGS) IMPLANT
GLOVE BIO SURGEON STRL SZ7 (GLOVE) ×1 IMPLANT
GLOVE BIOGEL PI IND STRL 7.0 (GLOVE) IMPLANT
GLOVE BIOGEL PI IND STRL 7.5 (GLOVE) IMPLANT
GLOVE BIOGEL PI INDICATOR 7.0 (GLOVE) ×3
GLOVE BIOGEL PI INDICATOR 7.5 (GLOVE) ×1
GLOVE SURG SS PI 7.5 STRL IVOR (GLOVE) ×2 IMPLANT
GLOVE SURG SYN 7.5  E (GLOVE) ×3
GLOVE SURG SYN 7.5 E (GLOVE) ×3 IMPLANT
GLOVE SURG SYN 7.5 PF PI (GLOVE) ×1 IMPLANT
GOWN STRL REUS W/ TWL LRG LVL3 (GOWN DISPOSABLE) ×1 IMPLANT
GOWN STRL REUS W/ TWL XL LVL3 (GOWN DISPOSABLE) ×1 IMPLANT
GOWN STRL REUS W/TWL LRG LVL3 (GOWN DISPOSABLE) ×4
GOWN STRL REUS W/TWL XL LVL3 (GOWN DISPOSABLE) ×2
KIT MARKER MARGIN INK (KITS) ×3 IMPLANT
NDL HYPO 25X1 1.5 SAFETY (NEEDLE) ×1 IMPLANT
NEEDLE HYPO 25X1 1.5 SAFETY (NEEDLE) ×2 IMPLANT
NS IRRIG 1000ML POUR BTL (IV SOLUTION) IMPLANT
PACK BASIN DAY SURGERY FS (CUSTOM PROCEDURE TRAY) ×2 IMPLANT
PENCIL BUTTON HOLSTER BLD 10FT (ELECTRODE) ×2 IMPLANT
SLEEVE SCD COMPRESS KNEE MED (MISCELLANEOUS) ×2 IMPLANT
SPONGE LAP 18X18 RF (DISPOSABLE) ×2 IMPLANT
STAPLER VISISTAT 35W (STAPLE) ×1 IMPLANT
STRIP CLOSURE SKIN 1/4X4 (GAUZE/BANDAGES/DRESSINGS) IMPLANT
SUT MNCRL AB 4-0 PS2 18 (SUTURE) ×2 IMPLANT
SUT VICRYL 3-0 CR8 SH (SUTURE) ×3 IMPLANT
SYR CONTROL 10ML LL (SYRINGE) ×2 IMPLANT
TOWEL GREEN STERILE FF (TOWEL DISPOSABLE) ×2 IMPLANT
TRAY FAXITRON CT DISP (TRAY / TRAY PROCEDURE) ×3 IMPLANT
TUBE CONNECTING 20X1/4 (TUBING) ×2 IMPLANT
YANKAUER SUCT BULB TIP NO VENT (SUCTIONS) ×2 IMPLANT

## 2019-08-09 NOTE — Transfer of Care (Signed)
Immediate Anesthesia Transfer of Care Note  Patient: Anyssa Sedita  Procedure(s) Performed: RIGHT BREAST LUMPECTOMY X 2 WITH RADIOACTIVE SEED LOCALIZATION (3 SEEDS) (Right Breast)  Patient Location: PACU  Anesthesia Type:General  Level of Consciousness: sedated and patient cooperative  Airway & Oxygen Therapy: Patient Spontanous Breathing and Patient connected to face mask oxygen  Post-op Assessment: Report given to RN and Post -op Vital signs reviewed and stable  Post vital signs: Reviewed and stable  Last Vitals:  Vitals Value Taken Time  BP    Temp    Pulse 80 08/09/19 1343  Resp    SpO2 100 % 08/09/19 1343  Vitals shown include unvalidated device data.  Last Pain:  Vitals:   08/09/19 1053  TempSrc: Oral  PainSc: 0-No pain      Patients Stated Pain Goal: 1 ([id number])  Complications: No apparent anesthesia complications

## 2019-08-09 NOTE — Anesthesia Postprocedure Evaluation (Signed)
Anesthesia Post Note  Patient: Susan Miles  Procedure(s) Performed: RIGHT BREAST LUMPECTOMY X 2 WITH RADIOACTIVE SEED LOCALIZATION (3 SEEDS) (Right Breast)     Patient location during evaluation: PACU Anesthesia Type: General Level of consciousness: awake and alert Pain management: pain level controlled Vital Signs Assessment: post-procedure vital signs reviewed and stable Respiratory status: spontaneous breathing, nonlabored ventilation and respiratory function stable Cardiovascular status: blood pressure returned to baseline and stable Postop Assessment: no apparent nausea or vomiting Anesthetic complications: no    Last Vitals:  Vitals:   08/09/19 1445 08/09/19 1500  BP: (!) 156/84 (!) 150/77  Pulse: 77 79  Resp: (!) 24 18  Temp:  36.9 C  SpO2: 98% 96%    Last Pain:  Vitals:   08/09/19 1500  TempSrc:   PainSc: 0-No pain                 Lynda Rainwater

## 2019-08-09 NOTE — Discharge Instructions (Signed)
°  Post Anesthesia Home Care Instructions  Activity: Get plenty of rest for the remainder of the day. A responsible individual must stay with you for 24 hours following the procedure.  For the next 24 hours, DO NOT: -Drive a car -Paediatric nurse -Drink alcoholic beverages -Take any medication unless instructed by your physician -Make any legal decisions or sign important papers.  Meals: Start with liquid foods such as gelatin or soup. Progress to regular foods as tolerated. Avoid greasy, spicy, heavy foods. If nausea and/or vomiting occur, drink only clear liquids until the nausea and/or vomiting subsides. Call your physician if vomiting continues.  Special Instructions/Symptoms: Your throat may feel dry or sore from the anesthesia or the breathing tube placed in your throat during surgery. If this causes discomfort, gargle with warm salt water. The discomfort should disappear within 24 hours.  If you had a scopolamine patch placed behind your ear for the management of post- operative nausea and/or vomiting:  1. The medication in the patch is effective for 72 hours, after which it should be removed.  Wrap patch in a tissue and discard in the trash. Wash hands thoroughly with soap and water. 2. You may remove the patch earlier than 72 hours if you experience unpleasant side effects which may include dry mouth, dizziness or visual disturbances. 3. Avoid touching the patch. Wash your hands with soap and water after contact with the patch.  [facility] [place] [facility] - DISCHARGE INSTRUCTIONS TO PATIENT  Activity:  Driving - May drive in 2 or 3 days   Lifting - No lifting more than 15 pounds for 5 days                       Practice you Covid-19 protection:  Wear a mask, social distance, and wash your hands frequently  Wound Care:   Leave the incision dry for 2 days, then you may shower  Diet:  As tolerated  Follow up appointment:  Call Dr. Pollie Friar office [facility]) at  [phone] for an appointment in 2 to 3 weeks.  Medications and dosages:  Resume your home medications.  You have a prescription for:  Vicodin  Call Dr. Lucia Gaskins or his office  [phone]) if you have:  Temperature greater than 100.4,  Persistent nausea and vomiting,  Severe uncontrolled pain,  Redness, tenderness, or signs of infection (pain, swelling, redness, odor or green/yellow discharge around the site),  Any other questions or concerns you may have after discharge.  In an emergency, call 911 or go to an Emergency Department at a nearby hospital.

## 2019-08-09 NOTE — Anesthesia Procedure Notes (Signed)
Procedure Name: LMA Insertion Date/Time: 08/09/2019 11:41 AM Performed by: Signe Colt, CRNA Pre-anesthesia Checklist: Patient identified, Emergency Drugs available, Suction available and Patient being monitored Patient Re-evaluated:Patient Re-evaluated prior to induction Oxygen Delivery Method: Circle system utilized Preoxygenation: Pre-oxygenation with 100% oxygen Induction Type: IV induction Ventilation: Mask ventilation without difficulty LMA: LMA inserted LMA Size: 4.0 Number of attempts: 1 Airway Equipment and Method: Bite block Placement Confirmation: positive ETCO2 Tube secured with: Tape Dental Injury: Teeth and Oropharynx as per pre-operative assessment

## 2019-08-09 NOTE — Anesthesia Preprocedure Evaluation (Signed)
Anesthesia Evaluation  Patient identified by MRN, date of birth, ID band Patient awake    Reviewed: Allergy & Precautions, NPO status , Patient's Chart, lab work & pertinent test results  Airway Mallampati: II  TM Distance: >3 FB Neck ROM: Full    Dental no notable dental hx.    Pulmonary sleep apnea ,    Pulmonary exam normal breath sounds clear to auscultation       Cardiovascular hypertension, Pt. on medications negative cardio ROS Normal cardiovascular exam Rhythm:Regular Rate:Normal     Neuro/Psych negative neurological ROS  negative psych ROS   GI/Hepatic negative GI ROS, Neg liver ROS,   Endo/Other  negative endocrine ROSdiabetes, Type 2  Renal/GU negative Renal ROS  negative genitourinary   Musculoskeletal negative musculoskeletal ROS (+)   Abdominal (+) + obese,   Peds negative pediatric ROS (+)  Hematology negative hematology ROS (+)   Anesthesia Other Findings   Reproductive/Obstetrics negative OB ROS                             Anesthesia Physical Anesthesia Plan  ASA: III  Anesthesia Plan: General   Post-op Pain Management:    Induction: Intravenous  PONV Risk Score and Plan: 3 and Ondansetron, Dexamethasone, Midazolam and Treatment may vary due to age or medical condition  Airway Management Planned: LMA  Additional Equipment:   Intra-op Plan:   Post-operative Plan: Extubation in OR  Informed Consent: I have reviewed the patients History and Physical, chart, labs and discussed the procedure including the risks, benefits and alternatives for the proposed anesthesia with the patient or authorized representative who has indicated his/her understanding and acceptance.     Dental advisory given  Plan Discussed with: CRNA  Anesthesia Plan Comments:         Anesthesia Quick Evaluation

## 2019-08-09 NOTE — Interval H&P Note (Signed)
History and Physical Interval Note:  08/09/2019 11:19 AM  Susan Miles  has presented today for surgery, with the diagnosis of RIGHT BREAST CANCER X 2.  The various methods of treatment have been discussed with the patient and family.   Her husband is here with her.  I checked the 3 seeds.  After consideration of risks, benefits and other options for treatment, the patient has consented to  Procedure(s): RIGHT BREAST LUMPECTOMY X 2 WITH RADIOACTIVE SEED LOCALIZATION (3 SEEDS) (Right) as a surgical intervention.  The patient's history has been reviewed, patient examined, no change in status, stable for surgery.  I have reviewed the patient's chart and labs.  Questions were answered to the patient's satisfaction.     Shann Medal

## 2019-08-09 NOTE — Op Note (Signed)
08/09/2019  1:50 PM  PATIENT:  Susan Miles DOB: [birth year] MRN: [id number]  PREOP DIAGNOSIS:   RIGHT BREAST CANCER X 2  POSTOP DIAGNOSIS:    Right breast cancer, 8 o'clock and 11 o'clock position (Tis, N0)  PROCEDURE:   Procedure(s): RIGHT BREAST LUMPECTOMY X 2 WITH RADIOACTIVE SEED LOCALIZATION (3 SEEDS)  SURGEON:   Alphonsa Overall, M.D.  ANESTHESIA:   General  Anesthesiologist: Lynda Rainwater, MD CRNA: Blocker, Ernesta Amble, CRNA  General  EBL:  100  ml  DRAINS:  none   LOCAL MEDICATIONS USED:   30 cc 1/4% marcaine  SPECIMEN:   Right breast lumpectomy 8 o'clock (6 color paint), Superior margin right breast lumpectomy, Right breast lumpectomy 11 o'clock (6 color paint)  COUNTS CORRECT:  YES  INDICATIONS FOR PROCEDURE:  Susan Miles is a 73 y.o. (DOB: 08/31/1946) AA female whose primary care physician is [name], MD and comes for right breast lumpectomy x 2.     [facility] with Drs. Lindi Adie and [place] for two areas of DCIS in her right breast.  The options for breast cancer treatment have been discussed with the patient. She elected to proceed with lumpectomy x 2 (single seed for one area and bracket 2 seeds for the other).     The indications and potential complications of surgery were explained to the patient. Potential complications include, but are not limited to, bleeding, infection, the need for further surgery, and nerve injury.     She had a three (3) I131 seed placed on 08/08/2019 in her right breast at [facility].  The seed is in the 8 o'clock and 11 o'clock position of the right breast.     OPERATIVE NOTE:   The patient was taken to operating room # 5 at [facility] where she underwent a general anesthesia  supervised by Anesthesiologist: Lynda Rainwater, MD CRNA: Blocker, Ernesta Amble, CRNA. Her right breast and axilla were prepped with  ChloraPrep and sterilely draped.    A time-out and the  surgical check list was reviewed.    The cancer was about at the 8 o'clock position and 11 o'clock position of the right breast.   The 8 o'clock tumor was 6 cm from the areola.  I made a radial incision in the lower outer quadrant.    I used the Neoprobe to identify the I131 seed.  I tried to excise an area around the tumor of at least 1 cm.    I excised this block of breast tissue approximately 4 cm by 4 cm  in diameter.   I painted the lumpectomy specimen with the 6 color paint kit and did a specimen mammogram which confirmed the mass, clip, and the seed were all in the right position in the specimen.  The specimen was sent to pathology who called back to confirm that they have the seed and the specimen.   The second area of DCIS was about at the 11 o'clock position of the right breast.   This was about 3.5 cm of microca+ on the mammograms, so the idea was to bracket this area with two seeds.  I used the Neoprobe to identify the two I131 seeds.  I tried to excise the area between the seeds and around the tumor of at least 1 cm.    I excised this block of breast tissue approximately 4 cm by 5 cm  in diameter.   I  painted the lumpectomy specimen with the 6 color paint kit and did a specimen mammogram which confirmed the mass, clip, and the seed were all in the right position in the specimen.  The specimen was sent to pathology who called back to confirm that they have the seed and the specimen.   I then irrigated the wound with saline. I infiltrated approximately 30 mL of 1/4% Marcaine between the incisions. I placed 4 clips to mark each biopsy cavity, at 12, 3, 6, and 9 o'clock.  I then closed all the wounds in layers using 3-0 Vicryl sutures for the deep layer. At the skin, I closed the incisions with a 4-0 Monocryl suture. The incisions were then painted with Dermabond.  She had gauze place over the wounds and placed in a breast binder.   The patient tolerated the procedure well, was transported to the  recovery room in good condition. Sponge and needle count were correct at the end of the case.   Final pathology is pending.   11 o'clock lumpectomy (2 seeds)    8 o'clock lumpectomy   Alphonsa Overall, MD, [facility] Pager: [phone] Office phone:  [phone]

## 2019-08-13 ENCOUNTER — Encounter (HOSPITAL_BASED_OUTPATIENT_CLINIC_OR_DEPARTMENT_OTHER): Payer: Self-pay | Admitting: Surgery

## 2019-08-15 ENCOUNTER — Encounter: Payer: Self-pay | Admitting: *Deleted

## 2019-08-15 NOTE — Progress Notes (Signed)
Patient Care Team: [name], MD as PCP - General (Family Medicine) Webb Laws, [place] as Referring Physician (Optometry) Rockwell Germany, RN as Oncology Nurse Navigator Mauro Kaufmann, RN as Oncology Nurse Navigator Nicholas Lose, MD as Consulting Physician (Hematology and Oncology) Kyung Rudd, MD as Consulting Physician (Radiation Oncology) Alphonsa Overall, MD as Consulting Physician (General Surgery)  DIAGNOSIS:    ICD-10-CM   1. Ductal carcinoma in situ (DCIS) of right breast  D05.11     SUMMARY OF ONCOLOGIC HISTORY: Oncology History  Ductal carcinoma in situ (DCIS) of right breast  07/04/2019 Initial Diagnosis   Routine screening mammogram detected 1.6cm mass in the right breast at the 9 o'clock position, loosely group calcifications in the upper right breast spanning 3.6cm, and no axillary adenopathy. Biopsy showed intermediate grade DCIS, ER 100%, PR 100%.    07/10/2019 Cancer Staging   Staging form: Breast, AJCC 8th Edition - Clinical stage from 07/10/2019: Stage 0 (cTis (DCIS), cN0, cM0, ER+, PR+, HER2: Not Assessed) - Signed by Nicholas Lose, MD on 07/10/2019   08/09/2019 Surgery   Right lumpectomy Lucia Gaskins): multifocal low grade DCIS spanning 1.4cm and 0.9cm, clear margins.      CHIEF COMPLIANT: Follow-up s/p lumpectomy to review pathology report  INTERVAL HISTORY: Susan Miles is a 73 y.o. with above-mentioned history of right breast DCIS. She underwent a lumpectomy on 08/09/19 with Dr. Lucia Gaskins for which pathology revealed multifocal low grade DCIS spanning 1.4cm and 0.9cm, clear margins. She presents to the clinic today to review the pathology report and discuss further treatment.   REVIEW OF SYSTEMS:   Constitutional: Denies fevers, chills or abnormal weight loss Eyes: Denies blurriness of vision Ears, nose, mouth, throat, and face: Denies mucositis or sore throat Respiratory: Denies cough, dyspnea or wheezes Cardiovascular: Denies palpitation, chest  discomfort Gastrointestinal: Denies nausea, heartburn or change in bowel habits Skin: Denies abnormal skin rashes Lymphatics: Denies new lymphadenopathy or easy bruising Neurological: Denies numbness, tingling or new weaknesses Behavioral/Psych: Mood is stable, no new changes  Extremities: No lower extremity edema Breast: denies any pain or lumps or nodules in either breasts All other systems were reviewed with the patient and are negative.  I have reviewed the past medical history, past surgical history, social history and family history with the patient and they are unchanged from previous note.  ALLERGIES:  is allergic to ace inhibitors.  MEDICATIONS:  Current Outpatient Medications  Medication Sig Dispense Refill  . ACCU-CHEK AVIVA PLUS test strip USE TO TEST BLOOD SUGAR ONCE DAILY. DX E11.9 100 strip 1  . acetaminophen (TYLENOL) 500 MG tablet Take 500 mg by mouth every 6 (six) hours as needed for mild pain.    Marland Kitchen amLODipine (NORVASC) 10 MG tablet Take 1 tablet (10 mg total) by mouth daily. 90 tablet 1  . aspirin 81 MG tablet Take 81 mg by mouth daily.    Marland Kitchen atorvastatin (LIPITOR) 20 MG tablet Take 1 tablet (20 mg total) by mouth daily at 6 PM. 90 tablet 1  . Blood Glucose Monitoring Suppl (LDR BLOOD GLUCOSE TRUETEST) w/Device KIT 1 Device by Does not apply route daily. Test blood sugar once daily. Dx: E11.9 1 kit 0  . cetirizine (ZYRTEC) 10 MG tablet Take 1 tablet (10 mg total) by mouth daily. 90 tablet 1  . cloNIDine (CATAPRES) 0.1 MG tablet Take 1 tablet (0.1 mg total) by mouth 2 (two) times daily. 180 tablet 1  . cloNIDine (CATAPRES) 0.2 MG tablet TAKE 1 TABLET (0.2  MG TOTAL) BY MOUTH 2 TIMES DAILY 180 tablet 1  . dapagliflozin propanediol (FARXIGA) 10 MG TABS tablet Take 10 mg by mouth daily. 90 tablet 1  . fluticasone (FLONASE) 50 MCG/ACT nasal spray Place 1-2 sprays into both nostrils daily. 16 g 6  . glipiZIDE (GLUCOTROL) 10 MG tablet TAKE 2 TABLETS (20 MG TOTAL) BY MOUTH 2  (TWO) TIMES DAILY BEFORE A MEAL. 360 tablet 1  . HYDROcodone-acetaminophen (NORCO/VICODIN) 5-325 MG tablet Take 1 tablet by mouth every 6 (six) hours as needed for moderate pain. 15 tablet 0  . Lancets MISC Test blood sugar once daily. Dx: E11.9 100 each 3  . losartan (COZAAR) 100 MG tablet Take 1 tablet (100 mg total) by mouth daily. 90 tablet 1  . metFORMIN (GLUCOPHAGE) 1000 MG tablet Take 1 tablet (1,000 mg total) by mouth 2 (two) times daily with a meal. 180 tablet 1  . metoprolol (TOPROL-XL) 200 MG 24 hr tablet TAKE 1 TABLET (200 MG TOTAL) BY MOUTH DAILY. 90 tablet 1   No current facility-administered medications for this visit.     PHYSICAL EXAMINATION: ECOG PERFORMANCE STATUS: 1 - Symptomatic but completely ambulatory  Vitals:   08/16/19 1109  BP: 136/74  Pulse: 74  Resp: 16  Temp: 98.2 F (36.8 C)  SpO2: 100%   Filed Weights   08/16/19 1109  Weight: 187 lb 12.8 oz (85.2 kg)    GENERAL: alert, no distress and comfortable SKIN: skin color, texture, turgor are normal, no rashes or significant lesions EYES: normal, Conjunctiva are pink and non-injected, sclera clear OROPHARYNX: no exudate, no erythema and lips, buccal mucosa, and tongue normal  NECK: supple, thyroid normal size, non-tender, without nodularity LYMPH: no palpable lymphadenopathy in the cervical, axillary or inguinal LUNGS: clear to auscultation and percussion with normal breathing effort HEART: regular rate & rhythm and no murmurs and no lower extremity edema ABDOMEN: abdomen soft, non-tender and normal bowel sounds MUSCULOSKELETAL: no cyanosis of digits and no clubbing  NEURO: alert & oriented x 3 with fluent speech, no focal motor/sensory deficits EXTREMITIES: No lower extremity edema  LABORATORY DATA:  I have reviewed the data as listed CMP Latest Ref Rng & Units 08/06/2019 07/10/2019 05/09/2019  Glucose 70 - 99 mg/dL 173(H) 241(H) 143(H)  BUN 8 - 23 mg/dL '15 17 12  ' Creatinine 0.44 - 1.00 mg/dL 1.09(H)  1.24(H) 1.00  Sodium 135 - 145 mmol/L 138 138 140  Potassium 3.5 - 5.1 mmol/L 4.2 3.9 4.2  Chloride 98 - 111 mmol/L 105 105 106  CO2 22 - 32 mmol/L 22 20(L) 18(L)  Calcium 8.9 - 10.3 mg/dL 10.6(H) 10.4(H) 10.6(H)  Total Protein 6.5 - 8.1 g/dL - 7.3 6.8  Total Bilirubin 0.3 - 1.2 mg/dL - 0.6 0.4  Alkaline Phos 38 - 126 U/L - 70 70  AST 15 - 41 U/L - 11(L) 11  ALT 0 - 44 U/L - 10 12    Lab Results  Component Value Date   WBC 5.4 07/10/2019   HGB 13.1 07/10/2019   HCT 41.5 07/10/2019   MCV 85.7 07/10/2019   PLT 329 07/10/2019   NEUTROABS 3.0 07/10/2019    ASSESSMENT & PLAN:  Ductal carcinoma in situ (DCIS) of right breast 07/04/2019:Routine screening mammogram detected 1.6cm mass in the right breast at the 9 o'clock position, loosely group calcifications in the upper right breast spanning 3.6cm, and no axillary adenopathy. Biopsy showed intermediate grade DCIS, ER 100%, PR 100%.  Tix Nx Stage 0 08/09/2019: Right lumpectomy: DCIS  with calcifications, low-grade, 1.4 cm, ALH, margins negative, second lumpectomy: DCIS with calcifications, low-grade, 0.9 cm, ER 100%, PR 100%, Tis NX stage 0  Pathology counseling: I discussed the final pathology report of the patient provided  a copy of this report. I discussed the margins. We also discussed the final staging along with previously performed ER/PR testing.  Treatment plan: 1.  Adjuvant radiation therapy followed by 2.  Adjuvant antiestrogen therapy with tamoxifen x5 years  Return to clinic at the end of radiation to start antiestrogen therapy.      No orders of the defined types were placed in this encounter.  The patient has a good understanding of the overall plan. she agrees with it. she will call with any problems that may develop before the next visit here.  Nicholas Lose, MD 08/16/2019  Julious Oka Dorshimer am acting as scribe for Dr. Nicholas Lose.  I have reviewed the above documentation for accuracy and completeness, and I  agree with the above.

## 2019-08-16 ENCOUNTER — Inpatient Hospital Stay: Payer: Medicare HMO | Attending: Hematology and Oncology | Admitting: Hematology and Oncology

## 2019-08-16 ENCOUNTER — Other Ambulatory Visit: Payer: Self-pay

## 2019-08-16 DIAGNOSIS — D0511 Intraductal carcinoma in situ of right breast: Secondary | ICD-10-CM | POA: Insufficient documentation

## 2019-08-16 DIAGNOSIS — Z79899 Other long term (current) drug therapy: Secondary | ICD-10-CM | POA: Diagnosis not present

## 2019-08-16 DIAGNOSIS — Z170 Estrogen receptor positive status [ER+]: Secondary | ICD-10-CM | POA: Insufficient documentation

## 2019-08-16 DIAGNOSIS — Z7984 Long term (current) use of oral hypoglycemic drugs: Secondary | ICD-10-CM | POA: Diagnosis not present

## 2019-08-16 DIAGNOSIS — Z7982 Long term (current) use of aspirin: Secondary | ICD-10-CM | POA: Diagnosis not present

## 2019-08-16 NOTE — Assessment & Plan Note (Signed)
07/04/2019:Routine screening mammogram detected 1.6cm mass in the right breast at the 9 o'clock position, loosely group calcifications in the upper right breast spanning 3.6cm, and no axillary adenopathy. Biopsy showed intermediate grade DCIS, ER 100%, PR 100%.  Tix Nx Stage 0 08/09/2019: Right lumpectomy: DCIS with calcifications, low-grade, 1.4 cm, ALH, margins negative, second lumpectomy: DCIS with calcifications, low-grade, 0.9 cm, ER 100%, PR 100%, Tis NX stage 0  Pathology counseling: I discussed the final pathology report of the patient provided  a copy of this report. I discussed the margins. We also discussed the final staging along with previously performed ER/PR testing.  Treatment plan: 1.  Adjuvant radiation therapy followed by 2.  Adjuvant antiestrogen therapy with tamoxifen x5 years  Return to clinic at the end of radiation to start antiestrogen therapy.

## 2019-08-27 ENCOUNTER — Ambulatory Visit
Admission: RE | Admit: 2019-08-27 | Discharge: 2019-08-27 | Disposition: A | Discharge status: Home / Self Care | Payer: Medicare HMO | Source: Ambulatory Visit | Attending: Radiation Oncology | Admitting: Radiation Oncology

## 2019-08-27 ENCOUNTER — Other Ambulatory Visit: Payer: Self-pay

## 2019-08-27 ENCOUNTER — Encounter: Payer: Self-pay | Admitting: Radiation Oncology

## 2019-08-27 DIAGNOSIS — Z170 Estrogen receptor positive status [ER+]: Secondary | ICD-10-CM | POA: Diagnosis not present

## 2019-08-27 DIAGNOSIS — Z803 Family history of malignant neoplasm of breast: Secondary | ICD-10-CM | POA: Diagnosis not present

## 2019-08-27 DIAGNOSIS — Z9889 Other specified postprocedural states: Secondary | ICD-10-CM | POA: Diagnosis not present

## 2019-08-27 DIAGNOSIS — D0511 Intraductal carcinoma in situ of right breast: Secondary | ICD-10-CM

## 2019-08-27 NOTE — Patient Instructions (Signed)
Coronavirus (COVID-19) Are you at risk?  Are you at risk for the Coronavirus (COVID-19)?  To be considered HIGH RISK for Coronavirus (COVID-19), you have to meet the following criteria:  . Traveled to [place], [place], [place], [place] or [place]; or in the [place] to [place], [place], [place], or [place]; and have fever, cough, and shortness of breath within the last 2 weeks of travel OR . Been in close contact with a person diagnosed with COVID-19 within the last 2 weeks and have fever, cough, and shortness of breath . IF YOU DO NOT MEET THESE CRITERIA, YOU ARE CONSIDERED LOW RISK FOR COVID-19.  What to do if you are HIGH RISK for COVID-19?  . If you are having a medical emergency, call 911. . Seek medical care right away. Before you go to a doctor's office, urgent care or emergency department, call ahead and tell them about your recent travel, contact with someone diagnosed with COVID-19, and your symptoms. You should receive instructions from your physician's office regarding next steps of care.  . When you arrive at healthcare provider, tell the healthcare staff immediately you have returned from visiting [place], [place], [place], [place] or [place]; or traveled in the [place] to [place], [place], [place], or [place]; in the last two weeks or you have been in close contact with a person diagnosed with COVID-19 in the last 2 weeks.   . Tell the health care staff about your symptoms: fever, cough and shortness of breath. . After you have been seen by a medical provider, you will be either: o Tested for (COVID-19) and discharged home on quarantine except to seek medical care if symptoms worsen, and asked to  - Stay home and avoid contact with others until you get your results (4-5 days)  - Avoid travel on public transportation if possible (such as bus, train, or airplane) or o Sent to the Emergency Department by EMS for evaluation, COVID-19 testing, and possible  admission depending on your condition and test results.  What to do if you are LOW RISK for COVID-19?  Reduce your risk of any infection by using the same precautions used for avoiding the common cold or flu:  . Wash your hands often with soap and warm water for at least 20 seconds.  If soap and water are not readily available, use an alcohol-based hand sanitizer with at least 60% alcohol.  . If coughing or sneezing, cover your mouth and nose by coughing or sneezing into the elbow areas of your shirt or coat, into a tissue or into your sleeve (not your hands). . Avoid shaking hands with others and consider head nods or verbal greetings only. . Avoid touching your eyes, nose, or mouth with unwashed hands.  . Avoid close contact with people who are sick. . Avoid places or events with large numbers of people in one location, like concerts or sporting events. . Carefully consider travel plans you have or are making. . If you are planning any travel outside or inside the [place], visit the CDC's Travelers' Health webpage for the latest health notices. . If you have some symptoms but not all symptoms, continue to monitor at home and seek medical attention if your symptoms worsen. . If you are having a medical emergency, call 911.   ADDITIONAL HEALTHCARE OPTIONS FOR PATIENTS  [facility] / e-Visit: [email or web address]         [facility]: [phone]  [facility]: [phone]                   [facility]: [phone]   

## 2019-08-27 NOTE — Progress Notes (Signed)
Radiation Oncology         [phone]) [phone] ________________________________  Name: Susan Miles        MRN: [id number]  Date of Service: 08/27/2019 DOB: Jan 24, 1946  CL:EXNTZG, Ranell Patrick, MD  Nicholas Lose, MD     REFERRING PHYSICIAN: Nicholas Lose, MD   DIAGNOSIS: The encounter diagnosis was Ductal carcinoma in situ (DCIS) of right breast.   HISTORY OF PRESENT ILLNESS: Susan Miles is a 73 y.o. female originally seen in the multidisciplinary breast clinic for a new diagnosis of right breast cancer. The patient was noted to have a screening detected abnormality in the right breast on mammogram. She had targeted ultrasoundon 06/26/2019 that revealed a .6 x .4 x 1.6 cm mass at 9:00, and her axilla was negative for adenopathy. There were also calcifications that measured 3.6 cm. A biopsy of both sites on 06/28/2019 revealed an intermediate grade DCIS with calcifications and her biopsies were ER/PR positive. She underwent bracketed lumpectomy on 08/09/2019 which revealed multifocal low grade DCIS, the largest measured 1.4 cm. Her margins were negative and she is seen today via Webex in our clinic to discuss the findings.  PREVIOUS RADIATION THERAPY: No   PAST MEDICAL HISTORY:  Past Medical History:  Diagnosis Date   Cancer ([place])    right breast cancer   Diabetes mellitus without complication ([place])    Family history of breast cancer    Hypertension    Sleep apnea    does not use CPAP       PAST SURGICAL HISTORY: Past Surgical History:  Procedure Laterality Date   ABDOMINAL HYSTERECTOMY     BREAST LUMPECTOMY WITH RADIOACTIVE SEED LOCALIZATION Right 08/09/2019   Procedure: RIGHT BREAST LUMPECTOMY X 2 WITH RADIOACTIVE SEED LOCALIZATION (3 SEEDS);  Surgeon: Alphonsa Overall, MD;  Location: [facility];  Service: General;  Laterality: Right;     FAMILY HISTORY:  Family History  Problem Relation Age of Onset   Hypertension Mother    Heart disease Father     Breast cancer Sister        diagnosed late 64s   Breast cancer Sister        diagnosed 24s     SOCIAL HISTORY:  reports that she has never smoked. She has never used smokeless tobacco. She reports that she does not drink alcohol or use drugs. The patient is married and lives in [place]. She is retired from [employer], educational.   ALLERGIES: Ace inhibitors   MEDICATIONS:  Current Outpatient Medications  Medication Sig Dispense Refill   ACCU-CHEK AVIVA PLUS test strip USE TO TEST BLOOD SUGAR ONCE DAILY. DX E11.9 100 strip 1   acetaminophen (TYLENOL) 500 MG tablet Take 500 mg by mouth every 6 (six) hours as needed for mild pain.     amLODipine (NORVASC) 10 MG tablet Take 1 tablet (10 mg total) by mouth daily. 90 tablet 1   aspirin 81 MG tablet Take 81 mg by mouth daily.     atorvastatin (LIPITOR) 20 MG tablet Take 1 tablet (20 mg total) by mouth daily at 6 PM. 90 tablet 1   Blood Glucose Monitoring Suppl (LDR BLOOD GLUCOSE TRUETEST) w/Device KIT 1 Device by Does not apply route daily. Test blood sugar once daily. Dx: E11.9 1 kit 0   cetirizine (ZYRTEC) 10 MG tablet Take 1 tablet (10 mg total) by mouth daily. 90 tablet 1   cloNIDine (CATAPRES) 0.1 MG tablet Take 1 tablet (0.1 mg total) by mouth 2 (two) times  daily. 180 tablet 1   cloNIDine (CATAPRES) 0.2 MG tablet TAKE 1 TABLET (0.2 MG TOTAL) BY MOUTH 2 TIMES DAILY 180 tablet 1   dapagliflozin propanediol (FARXIGA) 10 MG TABS tablet Take 10 mg by mouth daily. 90 tablet 1   fluticasone (FLONASE) 50 MCG/ACT nasal spray Place 1-2 sprays into both nostrils daily. 16 g 6   glipiZIDE (GLUCOTROL) 10 MG tablet TAKE 2 TABLETS (20 MG TOTAL) BY MOUTH 2 (TWO) TIMES DAILY BEFORE A MEAL. 360 tablet 1   HYDROcodone-acetaminophen (NORCO/VICODIN) 5-325 MG tablet Take 1 tablet by mouth every 6 (six) hours as needed for moderate pain. 15 tablet 0   Lancets MISC Test blood sugar once daily. Dx: E11.9 100 each 3   losartan (COZAAR) 100 MG tablet  Take 1 tablet (100 mg total) by mouth daily. 90 tablet 1   metFORMIN (GLUCOPHAGE) 1000 MG tablet Take 1 tablet (1,000 mg total) by mouth 2 (two) times daily with a meal. 180 tablet 1   metoprolol (TOPROL-XL) 200 MG 24 hr tablet TAKE 1 TABLET (200 MG TOTAL) BY MOUTH DAILY. 90 tablet 1   No current facility-administered medications for this encounter.      REVIEW OF SYSTEMS: On review of systems, the patient reports that she is doing well overall. She saw Dr. Lucia Gaskins yesterday and was told she is healing well. She denies any chest pain, shortness of breath, cough, fevers, chills, night sweats, unintended weight changes. She denies any bowel or bladder disturbances, and denies abdominal pain, nausea or vomiting. She denies any new musculoskeletal or joint aches or pains. A complete review of systems is obtained and is otherwise negative.     PHYSICAL EXAM:  Vitals not obtained by nursing. In general this is a well appearing African American female in no acute distress. She's alert and oriented x4 and appropriate throughout the examination. Cardiopulmonary assessment is negative for acute distress and she exhibits normal effort.    ECOG = 0  0 - Asymptomatic (Fully active, able to carry on all predisease activities without restriction)  1 - Symptomatic but completely ambulatory (Restricted in physically strenuous activity but ambulatory and able to carry out work of a light or sedentary nature. For example, light housework, office work)  2 - Symptomatic, <50% in bed during the day (Ambulatory and capable of all self care but unable to carry out any work activities. Up and about more than 50% of waking hours)  3 - Symptomatic, >50% in bed, but not bedbound (Capable of only limited self-care, confined to bed or chair 50% or more of waking hours)  4 - Bedbound (Completely disabled. Cannot carry on any self-care. Totally confined to bed or chair)  5 - Death   Eustace Pen MM, Creech RH, Tormey DC, et  al. [phone]). "Toxicity and response criteria of the [facility]". [place] Oncol. 5 (6): 649-55    LABORATORY DATA:  Lab Results  Component Value Date   WBC 5.4 07/10/2019   HGB 13.1 07/10/2019   HCT 41.5 07/10/2019   MCV 85.7 07/10/2019   PLT 329 07/10/2019   Lab Results  Component Value Date   NA 138 08/06/2019   K 4.2 08/06/2019   CL 105 08/06/2019   CO2 22 08/06/2019   Lab Results  Component Value Date   ALT 10 07/10/2019   AST 11 (L) 07/10/2019   ALKPHOS 70 07/10/2019   BILITOT 0.6 07/10/2019      RADIOGRAPHY: Mm Breast Surgical Specimen  Result  Date: 08/09/2019 CLINICAL DATA:  Specimen radiograph status post right breast lumpectomy. EXAM: SPECIMEN RADIOGRAPH OF THE RIGHT BREAST COMPARISON:  Previous exam(s). FINDINGS: Status post excision of the right breast. The radioactive seeds and coil shaped biopsy marker clip are present and completely intact. These findings were communicated with the OR at 1:01 p.m. IMPRESSION: Specimen radiograph of the right breast. Electronically Signed   By: Ammie Ferrier M.D.   On: 08/09/2019 13:02   Mm Breast Surgical Specimen  Result Date: 08/09/2019 CLINICAL DATA:  Radioactive seed localization was performed of the lateral right breast at the site X shaped biopsy clip, prior to surgical excision. EXAM: SPECIMEN RADIOGRAPH OF THE RIGHT BREAST COMPARISON:  Previous exam(s). FINDINGS: Status post excision of the right breast. The radioactive seed and X shaped biopsy marker clip are present, completely intact, and were marked for pathology. IMPRESSION: Specimen radiograph of the right breast. Electronically Signed   By: Curlene Dolphin M.D.   On: 08/09/2019 12:30   Mm Rt Radioactive Seed Loc Mammo Guide  Result Date: 08/08/2019 CLINICAL DATA:  Localization of DCIS in the lateral right breast marked with an X shaped clip. Localization of the anterior extent of calcifications marked with a coil shaped clip in the superior  right breast. Localization of the posterior aspect of the calcifications not marked with a surgical clip. EXAM: MAMMOGRAPHIC GUIDED RADIOACTIVE SEED LOCALIZATION OF THE RIGHT BREAST COMPARISON:  Previous exam(s). FINDINGS: Patient presents for radioactive seed localization prior to surgery. I met with the patient and we discussed the procedure of seed localization including benefits and alternatives. We discussed the high likelihood of a successful procedure. We discussed the risks of the procedure including infection, bleeding, tissue injury and further surgery. We discussed the low dose of radioactivity involved in the procedure. Informed, written consent was given. The usual time-out protocol was performed immediately prior to the procedure. Using mammographic guidance, sterile technique, 1% lidocaine and an I-125 radioactive seed, the posterior calcifications were localized using a superior approach. The follow-up mammogram images confirm the seed in the expected location and were marked for the surgeon. Follow-up survey of the patient confirms presence of the radioactive seed. Order number of I-125 seed:  [id number]. Total activity:  4.481 millicuries reference Date: August 05, 2019 Using mammographic guidance, sterile technique, 1% lidocaine and an I-125 radioactive seed, the anterior aspect of the calcifications were localized using a superior approach. The follow-up mammogram images confirm the seed in the expected location and were marked for the surgeon. Follow-up survey of the patient confirms presence of the radioactive seed. Order number of I-125 seed:  [id number]. Total activity:  2.637 millicuries reference Date: August 05, 2019 Using mammographic guidance, sterile technique, 1% lidocaine and an I-125 radioactive seed, was localized using a approach. The follow-up mammogram images confirm the seed in the expected location and were marked for Dr. Follow-up survey of the patient confirms presence of the  radioactive seed. Order number of I-125 seed:  [id number]. Total activity:  4.128 millicuries reference Date: July 23, 2019 The patient tolerated the procedure well and was released from the [facility]. She was given instructions regarding seed removal. IMPRESSION: Radioactive seed localization right breast. No apparent complications. Electronically Signed   By: Dorise Bullion III M.D   On: 08/08/2019 14:17   Mm Rt Radio Seed Ea Add Lesion Loc Mammo  Result Date: 08/08/2019 CLINICAL DATA:  Localization of DCIS in the lateral right breast marked with an X shaped clip. Localization of the anterior  extent of calcifications marked with a coil shaped clip in the superior right breast. Localization of the posterior aspect of the calcifications not marked with a surgical clip. EXAM: MAMMOGRAPHIC GUIDED RADIOACTIVE SEED LOCALIZATION OF THE RIGHT BREAST COMPARISON:  Previous exam(s). FINDINGS: Patient presents for radioactive seed localization prior to surgery. I met with the patient and we discussed the procedure of seed localization including benefits and alternatives. We discussed the high likelihood of a successful procedure. We discussed the risks of the procedure including infection, bleeding, tissue injury and further surgery. We discussed the low dose of radioactivity involved in the procedure. Informed, written consent was given. The usual time-out protocol was performed immediately prior to the procedure. Using mammographic guidance, sterile technique, 1% lidocaine and an I-125 radioactive seed, the posterior calcifications were localized using a superior approach. The follow-up mammogram images confirm the seed in the expected location and were marked for the surgeon. Follow-up survey of the patient confirms presence of the radioactive seed. Order number of I-125 seed:  [id number]. Total activity:  1.829 millicuries reference Date: August 05, 2019 Using mammographic guidance, sterile technique, 1% lidocaine  and an I-125 radioactive seed, the anterior aspect of the calcifications were localized using a superior approach. The follow-up mammogram images confirm the seed in the expected location and were marked for the surgeon. Follow-up survey of the patient confirms presence of the radioactive seed. Order number of I-125 seed:  [id number]. Total activity:  9.381 millicuries reference Date: August 05, 2019 Using mammographic guidance, sterile technique, 1% lidocaine and an I-125 radioactive seed, was localized using a approach. The follow-up mammogram images confirm the seed in the expected location and were marked for Dr. Follow-up survey of the patient confirms presence of the radioactive seed. Order number of I-125 seed:  [id number]. Total activity:  5.277 millicuries reference Date: July 23, 2019 The patient tolerated the procedure well and was released from the [facility]. She was given instructions regarding seed removal. IMPRESSION: Radioactive seed localization right breast. No apparent complications. Electronically Signed   By: Dorise Bullion III M.D   On: 08/08/2019 14:17   Mm Rt Radio Seed Ea Add Lesion Loc Mammo  Result Date: 08/08/2019 CLINICAL DATA:  Localization of DCIS in the lateral right breast marked with an X shaped clip. Localization of the anterior extent of calcifications marked with a coil shaped clip in the superior right breast. Localization of the posterior aspect of the calcifications not marked with a surgical clip. EXAM: MAMMOGRAPHIC GUIDED RADIOACTIVE SEED LOCALIZATION OF THE RIGHT BREAST COMPARISON:  Previous exam(s). FINDINGS: Patient presents for radioactive seed localization prior to surgery. I met with the patient and we discussed the procedure of seed localization including benefits and alternatives. We discussed the high likelihood of a successful procedure. We discussed the risks of the procedure including infection, bleeding, tissue injury and further surgery. We discussed  the low dose of radioactivity involved in the procedure. Informed, written consent was given. The usual time-out protocol was performed immediately prior to the procedure. Using mammographic guidance, sterile technique, 1% lidocaine and an I-125 radioactive seed, the posterior calcifications were localized using a superior approach. The follow-up mammogram images confirm the seed in the expected location and were marked for the surgeon. Follow-up survey of the patient confirms presence of the radioactive seed. Order number of I-125 seed:  [id number]. Total activity:  4.431 millicuries reference Date: August 05, 2019 Using mammographic guidance, sterile technique, 1% lidocaine and an I-125 radioactive seed, the anterior aspect of  the calcifications were localized using a superior approach. The follow-up mammogram images confirm the seed in the expected location and were marked for the surgeon. Follow-up survey of the patient confirms presence of the radioactive seed. Order number of I-125 seed:  [id number]. Total activity:  7.680 millicuries reference Date: August 05, 2019 Using mammographic guidance, sterile technique, 1% lidocaine and an I-125 radioactive seed, was localized using a approach. The follow-up mammogram images confirm the seed in the expected location and were marked for Dr. Follow-up survey of the patient confirms presence of the radioactive seed. Order number of I-125 seed:  [id number]. Total activity:  4.585 millicuries reference Date: July 23, 2019 The patient tolerated the procedure well and was released from the [facility]. She was given instructions regarding seed removal. IMPRESSION: Radioactive seed localization right breast. No apparent complications. Electronically Signed   By: Dorise Bullion III M.D   On: 08/08/2019 14:17       IMPRESSION/PLAN: 1. Low grade ER/PR positive DCIS of the right breast. Dr. Lisbeth Renshaw discusses the final pathology findings and reviews the nature of noninvasive  right breast disease. She has done well surgically and is ready to proceed with adjuvant external radiotherapy to the breast followed by antiestrogen therapy. We discussed the risks, benefits, short, and long term effects of radiotherapy, and the patient is interested in proceeding. Dr. Lisbeth Renshaw discusses the delivery and logistics of radiotherapy and recommends a course of 4 weeks of radiotherapy. She will return next Monday for simulation and will sign written consent at that time.    This encounter was provided by telemedicine platform Webex.  The patient has given verbal consent for this type of encounter and has been advised to only accept a meeting of this type in a secure network environment. The time spent during this encounter was 25 minutes. The attendants for this meeting include Brandt Loosen, RN, Dr. Lisbeth Renshaw, Hayden Pedro  and Grace Blight. During the encounter,  Brandt Loosen, RN, Dr. Lisbeth Renshaw, and Hayden Pedro were located at [facility] Radiation Oncology Department.  Twylia Oka was located in our clinic in the department.    The above documentation reflects my direct findings during this shared patient visit. Please see the separate note by Dr. Lisbeth Renshaw on this date for the remainder of the patient's plan of care.    Carola Rhine, PAC

## 2019-08-27 NOTE — Progress Notes (Signed)
Patient in for consult with Shona Simpson [facility] had her lumpectomy on 08/09/2019. Denies any pain at this time. Denies any issues.

## 2019-08-30 ENCOUNTER — Encounter: Payer: Self-pay | Admitting: *Deleted

## 2019-09-02 ENCOUNTER — Ambulatory Visit
Admission: RE | Admit: 2019-09-02 | Discharge: 2019-09-02 | Disposition: A | Discharge status: Home / Self Care | Payer: Medicare HMO | Source: Ambulatory Visit | Attending: Radiation Oncology | Admitting: Radiation Oncology

## 2019-09-02 ENCOUNTER — Other Ambulatory Visit: Payer: Self-pay

## 2019-09-02 DIAGNOSIS — Z170 Estrogen receptor positive status [ER+]: Secondary | ICD-10-CM | POA: Diagnosis not present

## 2019-09-02 DIAGNOSIS — D0511 Intraductal carcinoma in situ of right breast: Secondary | ICD-10-CM | POA: Diagnosis not present

## 2019-09-02 DIAGNOSIS — Z510 Encounter for antineoplastic radiation therapy: Secondary | ICD-10-CM | POA: Insufficient documentation

## 2019-09-03 DIAGNOSIS — Z510 Encounter for antineoplastic radiation therapy: Secondary | ICD-10-CM | POA: Diagnosis not present

## 2019-09-03 DIAGNOSIS — Z170 Estrogen receptor positive status [ER+]: Secondary | ICD-10-CM | POA: Diagnosis not present

## 2019-09-03 DIAGNOSIS — D0511 Intraductal carcinoma in situ of right breast: Secondary | ICD-10-CM | POA: Diagnosis not present

## 2019-09-03 NOTE — Progress Notes (Signed)
  Radiation Oncology         [phone] ________________________________  Name: Susan Miles MRN: [id number]  Date: 09/02/2019  DOB: January 11, 1946   DIAGNOSIS:     ICD-10-CM   1. Ductal carcinoma in situ (DCIS) of right breast  D05.11     SIMULATION AND TREATMENT PLANNING NOTE  The patient presented for simulation prior to beginning her course of radiation treatment for her diagnosis of right-sided breast cancer. The patient was placed in a supine position on a breast board. A customized vac-lock bag was constructed and this complex treatment device will be used on a daily basis during her treatment. In this fashion, a CT scan was obtained through the chest area and an isocenter was placed near the chest wall within the breast.  The patient will be planned to receive a course of radiation initially to a dose of 42.56 Gy. This will consist of a whole breast radiotherapy technique. To accomplish this, 2 customized blocks have been designed which will correspond to medial and lateral whole breast tangent fields. This treatment will be accomplished at 2.66 Gy per fraction. A forward planning technique will also be evaluated to determine if this approach improves the plan. It is anticipated that the patient will then receive a 8 Gy boost to the seroma cavity which has been contoured. This will be accomplished at 2 Gy per fraction.   This initial treatment will consist of a 3-D conformal technique. The seroma has been contoured as the primary target structure. Additionally, dose volume histograms of both this target as well as the lungs and heart will also be evaluated. Such an approach is necessary to ensure that the target area is adequately covered while the nearby critical  normal structures are adequately spared.  Plan:  The final anticipated total dose therefore will correspond to 50.56 Gy.    _______________________________   Jodelle Gross, MD, PhD

## 2019-09-03 NOTE — Progress Notes (Signed)
  Radiation Oncology         [phone]) [phone] ________________________________  Name: Thema Holson MRN: [id number]  Date: 09/02/2019  DOB: 1946-06-25  Optical Surface Tracking Plan:  Since intensity modulated radiotherapy (IMRT) and 3D conformal radiation treatment methods are predicated on accurate and precise positioning for treatment, intrafraction motion monitoring is medically necessary to ensure accurate and safe treatment delivery.  The ability to quantify intrafraction motion without excessive ionizing radiation dose can only be performed with optical surface tracking. Accordingly, surface imaging offers the opportunity to obtain 3D measurements of patient position throughout IMRT and 3D treatments without excessive radiation exposure.  I am ordering optical surface tracking for this patient's upcoming course of radiotherapy. ________________________________  Kyung Rudd, MD 09/03/2019 9:53 AM    Reference:   Particia Jasper, et al. Surface imaging-based analysis of intrafraction motion for breast radiotherapy patients.Journal of [place], n. 6, nov. 2014. ISSN [id number].   Available at: <http://www.jacmp.org/index.php/jacmp/article/view/4957>.

## 2019-09-04 ENCOUNTER — Encounter: Payer: Self-pay | Admitting: *Deleted

## 2019-09-06 ENCOUNTER — Telehealth: Payer: Self-pay | Admitting: Hematology and Oncology

## 2019-09-06 NOTE — Telephone Encounter (Signed)
Scheduled appt per 9/30 sch message - mailed letter with appt date and time

## 2019-09-09 ENCOUNTER — Other Ambulatory Visit: Payer: Self-pay

## 2019-09-09 ENCOUNTER — Ambulatory Visit
Admission: RE | Admit: 2019-09-09 | Discharge: 2019-09-09 | Disposition: A | Discharge status: Home / Self Care | Payer: Medicare HMO | Source: Ambulatory Visit | Attending: Radiation Oncology | Admitting: Radiation Oncology

## 2019-09-09 DIAGNOSIS — Z510 Encounter for antineoplastic radiation therapy: Secondary | ICD-10-CM | POA: Insufficient documentation

## 2019-09-09 DIAGNOSIS — D0511 Intraductal carcinoma in situ of right breast: Secondary | ICD-10-CM | POA: Insufficient documentation

## 2019-09-09 DIAGNOSIS — Z170 Estrogen receptor positive status [ER+]: Secondary | ICD-10-CM | POA: Diagnosis not present

## 2019-09-10 ENCOUNTER — Ambulatory Visit
Admission: RE | Admit: 2019-09-10 | Discharge: 2019-09-10 | Disposition: A | Discharge status: Home / Self Care | Payer: Medicare HMO | Source: Ambulatory Visit | Attending: Radiation Oncology | Admitting: Radiation Oncology

## 2019-09-10 ENCOUNTER — Other Ambulatory Visit: Payer: Self-pay

## 2019-09-10 DIAGNOSIS — Z170 Estrogen receptor positive status [ER+]: Secondary | ICD-10-CM | POA: Diagnosis not present

## 2019-09-10 DIAGNOSIS — Z510 Encounter for antineoplastic radiation therapy: Secondary | ICD-10-CM | POA: Diagnosis not present

## 2019-09-10 DIAGNOSIS — D0511 Intraductal carcinoma in situ of right breast: Secondary | ICD-10-CM | POA: Diagnosis not present

## 2019-09-11 ENCOUNTER — Other Ambulatory Visit: Payer: Self-pay

## 2019-09-11 ENCOUNTER — Ambulatory Visit
Admission: RE | Admit: 2019-09-11 | Discharge: 2019-09-11 | Disposition: A | Discharge status: Home / Self Care | Payer: Medicare HMO | Source: Ambulatory Visit | Attending: Radiation Oncology | Admitting: Radiation Oncology

## 2019-09-11 DIAGNOSIS — Z170 Estrogen receptor positive status [ER+]: Secondary | ICD-10-CM | POA: Diagnosis not present

## 2019-09-11 DIAGNOSIS — Z510 Encounter for antineoplastic radiation therapy: Secondary | ICD-10-CM | POA: Diagnosis not present

## 2019-09-11 DIAGNOSIS — D0511 Intraductal carcinoma in situ of right breast: Secondary | ICD-10-CM | POA: Diagnosis not present

## 2019-09-12 ENCOUNTER — Ambulatory Visit
Admission: RE | Admit: 2019-09-12 | Discharge: 2019-09-12 | Disposition: A | Discharge status: Home / Self Care | Payer: Medicare HMO | Source: Ambulatory Visit | Attending: Radiation Oncology | Admitting: Radiation Oncology

## 2019-09-12 ENCOUNTER — Other Ambulatory Visit: Payer: Self-pay

## 2019-09-12 DIAGNOSIS — D0511 Intraductal carcinoma in situ of right breast: Secondary | ICD-10-CM | POA: Diagnosis not present

## 2019-09-12 DIAGNOSIS — Z510 Encounter for antineoplastic radiation therapy: Secondary | ICD-10-CM | POA: Diagnosis not present

## 2019-09-12 DIAGNOSIS — Z170 Estrogen receptor positive status [ER+]: Secondary | ICD-10-CM | POA: Diagnosis not present

## 2019-09-13 ENCOUNTER — Ambulatory Visit
Admission: RE | Admit: 2019-09-13 | Discharge: 2019-09-13 | Disposition: A | Discharge status: Home / Self Care | Payer: Medicare HMO | Source: Ambulatory Visit | Attending: Radiation Oncology | Admitting: Radiation Oncology

## 2019-09-13 ENCOUNTER — Other Ambulatory Visit: Payer: Self-pay

## 2019-09-13 DIAGNOSIS — Z170 Estrogen receptor positive status [ER+]: Secondary | ICD-10-CM | POA: Diagnosis not present

## 2019-09-13 DIAGNOSIS — Z510 Encounter for antineoplastic radiation therapy: Secondary | ICD-10-CM | POA: Diagnosis not present

## 2019-09-13 DIAGNOSIS — D0511 Intraductal carcinoma in situ of right breast: Secondary | ICD-10-CM | POA: Diagnosis not present

## 2019-09-13 MED ORDER — ALRA NON-METALLIC DEODORANT (RAD-ONC)
1.0000 "application " | Freq: Once | TOPICAL | Status: AC
  Administered 2019-09-13: 1 via TOPICAL

## 2019-09-13 MED ORDER — RADIAPLEXRX EX GEL
Freq: Once | CUTANEOUS | Status: AC
  Administered 2019-09-13: 17:00:00 via TOPICAL

## 2019-09-16 ENCOUNTER — Ambulatory Visit
Admission: RE | Admit: 2019-09-16 | Discharge: 2019-09-16 | Disposition: A | Discharge status: Home / Self Care | Payer: Medicare HMO | Source: Ambulatory Visit | Attending: Radiation Oncology | Admitting: Radiation Oncology

## 2019-09-16 ENCOUNTER — Other Ambulatory Visit: Payer: Self-pay

## 2019-09-16 DIAGNOSIS — D0511 Intraductal carcinoma in situ of right breast: Secondary | ICD-10-CM | POA: Diagnosis not present

## 2019-09-16 DIAGNOSIS — Z510 Encounter for antineoplastic radiation therapy: Secondary | ICD-10-CM | POA: Diagnosis not present

## 2019-09-16 DIAGNOSIS — Z170 Estrogen receptor positive status [ER+]: Secondary | ICD-10-CM | POA: Diagnosis not present

## 2019-09-17 ENCOUNTER — Ambulatory Visit
Admission: RE | Admit: 2019-09-17 | Discharge: 2019-09-17 | Disposition: A | Discharge status: Home / Self Care | Payer: Medicare HMO | Source: Ambulatory Visit | Attending: Radiation Oncology | Admitting: Radiation Oncology

## 2019-09-17 ENCOUNTER — Other Ambulatory Visit: Payer: Self-pay

## 2019-09-17 DIAGNOSIS — Z170 Estrogen receptor positive status [ER+]: Secondary | ICD-10-CM | POA: Diagnosis not present

## 2019-09-17 DIAGNOSIS — D0511 Intraductal carcinoma in situ of right breast: Secondary | ICD-10-CM | POA: Diagnosis not present

## 2019-09-17 DIAGNOSIS — Z510 Encounter for antineoplastic radiation therapy: Secondary | ICD-10-CM | POA: Diagnosis not present

## 2019-09-18 ENCOUNTER — Other Ambulatory Visit: Payer: Self-pay

## 2019-09-18 ENCOUNTER — Ambulatory Visit
Admission: RE | Admit: 2019-09-18 | Discharge: 2019-09-18 | Disposition: A | Discharge status: Home / Self Care | Payer: Medicare HMO | Source: Ambulatory Visit | Attending: Radiation Oncology | Admitting: Radiation Oncology

## 2019-09-18 DIAGNOSIS — D0511 Intraductal carcinoma in situ of right breast: Secondary | ICD-10-CM | POA: Diagnosis not present

## 2019-09-18 DIAGNOSIS — Z170 Estrogen receptor positive status [ER+]: Secondary | ICD-10-CM | POA: Diagnosis not present

## 2019-09-18 DIAGNOSIS — Z510 Encounter for antineoplastic radiation therapy: Secondary | ICD-10-CM | POA: Diagnosis not present

## 2019-09-19 ENCOUNTER — Ambulatory Visit
Admission: RE | Admit: 2019-09-19 | Discharge: 2019-09-19 | Disposition: A | Discharge status: Home / Self Care | Payer: Medicare HMO | Source: Ambulatory Visit | Attending: Radiation Oncology | Admitting: Radiation Oncology

## 2019-09-19 ENCOUNTER — Other Ambulatory Visit: Payer: Self-pay

## 2019-09-19 DIAGNOSIS — Z510 Encounter for antineoplastic radiation therapy: Secondary | ICD-10-CM | POA: Diagnosis not present

## 2019-09-19 DIAGNOSIS — Z170 Estrogen receptor positive status [ER+]: Secondary | ICD-10-CM | POA: Diagnosis not present

## 2019-09-19 DIAGNOSIS — D0511 Intraductal carcinoma in situ of right breast: Secondary | ICD-10-CM | POA: Diagnosis not present

## 2019-09-20 ENCOUNTER — Other Ambulatory Visit: Payer: Self-pay

## 2019-09-20 ENCOUNTER — Ambulatory Visit
Admission: RE | Admit: 2019-09-20 | Discharge: 2019-09-20 | Disposition: A | Discharge status: Home / Self Care | Payer: Medicare HMO | Source: Ambulatory Visit | Attending: Radiation Oncology | Admitting: Radiation Oncology

## 2019-09-20 DIAGNOSIS — D0511 Intraductal carcinoma in situ of right breast: Secondary | ICD-10-CM | POA: Diagnosis not present

## 2019-09-20 DIAGNOSIS — Z170 Estrogen receptor positive status [ER+]: Secondary | ICD-10-CM | POA: Diagnosis not present

## 2019-09-20 DIAGNOSIS — Z510 Encounter for antineoplastic radiation therapy: Secondary | ICD-10-CM | POA: Diagnosis not present

## 2019-09-23 ENCOUNTER — Ambulatory Visit
Admission: RE | Admit: 2019-09-23 | Discharge: 2019-09-23 | Disposition: A | Discharge status: Home / Self Care | Payer: Medicare HMO | Source: Ambulatory Visit | Attending: Radiation Oncology | Admitting: Radiation Oncology

## 2019-09-23 ENCOUNTER — Other Ambulatory Visit: Payer: Self-pay

## 2019-09-23 DIAGNOSIS — D0511 Intraductal carcinoma in situ of right breast: Secondary | ICD-10-CM | POA: Diagnosis not present

## 2019-09-23 DIAGNOSIS — Z510 Encounter for antineoplastic radiation therapy: Secondary | ICD-10-CM | POA: Diagnosis not present

## 2019-09-23 DIAGNOSIS — Z170 Estrogen receptor positive status [ER+]: Secondary | ICD-10-CM | POA: Diagnosis not present

## 2019-09-24 ENCOUNTER — Other Ambulatory Visit: Payer: Self-pay

## 2019-09-24 ENCOUNTER — Ambulatory Visit
Admission: RE | Admit: 2019-09-24 | Discharge: 2019-09-24 | Disposition: A | Discharge status: Home / Self Care | Payer: Medicare HMO | Source: Ambulatory Visit | Attending: Radiation Oncology | Admitting: Radiation Oncology

## 2019-09-24 DIAGNOSIS — Z510 Encounter for antineoplastic radiation therapy: Secondary | ICD-10-CM | POA: Diagnosis not present

## 2019-09-24 DIAGNOSIS — Z170 Estrogen receptor positive status [ER+]: Secondary | ICD-10-CM | POA: Diagnosis not present

## 2019-09-24 DIAGNOSIS — D0511 Intraductal carcinoma in situ of right breast: Secondary | ICD-10-CM | POA: Diagnosis not present

## 2019-09-25 ENCOUNTER — Ambulatory Visit
Admission: RE | Admit: 2019-09-25 | Discharge: 2019-09-25 | Disposition: A | Discharge status: Home / Self Care | Payer: Medicare HMO | Source: Ambulatory Visit | Attending: Radiation Oncology | Admitting: Radiation Oncology

## 2019-09-25 ENCOUNTER — Other Ambulatory Visit: Payer: Self-pay

## 2019-09-25 DIAGNOSIS — D0511 Intraductal carcinoma in situ of right breast: Secondary | ICD-10-CM | POA: Diagnosis not present

## 2019-09-25 DIAGNOSIS — Z510 Encounter for antineoplastic radiation therapy: Secondary | ICD-10-CM | POA: Diagnosis not present

## 2019-09-25 DIAGNOSIS — Z170 Estrogen receptor positive status [ER+]: Secondary | ICD-10-CM | POA: Diagnosis not present

## 2019-09-26 ENCOUNTER — Other Ambulatory Visit: Payer: Self-pay

## 2019-09-26 ENCOUNTER — Ambulatory Visit
Admission: RE | Admit: 2019-09-26 | Discharge: 2019-09-26 | Disposition: A | Discharge status: Home / Self Care | Payer: Medicare HMO | Source: Ambulatory Visit | Attending: Radiation Oncology | Admitting: Radiation Oncology

## 2019-09-26 DIAGNOSIS — Z510 Encounter for antineoplastic radiation therapy: Secondary | ICD-10-CM | POA: Diagnosis not present

## 2019-09-26 DIAGNOSIS — D0511 Intraductal carcinoma in situ of right breast: Secondary | ICD-10-CM | POA: Diagnosis not present

## 2019-09-26 DIAGNOSIS — Z170 Estrogen receptor positive status [ER+]: Secondary | ICD-10-CM | POA: Diagnosis not present

## 2019-09-27 ENCOUNTER — Other Ambulatory Visit: Payer: Self-pay

## 2019-09-27 ENCOUNTER — Ambulatory Visit
Admission: RE | Admit: 2019-09-27 | Discharge: 2019-09-27 | Disposition: A | Discharge status: Home / Self Care | Payer: Medicare HMO | Source: Ambulatory Visit | Attending: Radiation Oncology | Admitting: Radiation Oncology

## 2019-09-27 ENCOUNTER — Ambulatory Visit: Payer: Medicare HMO | Admitting: Radiation Oncology

## 2019-09-27 DIAGNOSIS — Z170 Estrogen receptor positive status [ER+]: Secondary | ICD-10-CM | POA: Diagnosis not present

## 2019-09-27 DIAGNOSIS — Z510 Encounter for antineoplastic radiation therapy: Secondary | ICD-10-CM | POA: Diagnosis not present

## 2019-09-27 DIAGNOSIS — D0511 Intraductal carcinoma in situ of right breast: Secondary | ICD-10-CM | POA: Diagnosis not present

## 2019-09-29 NOTE — Progress Notes (Signed)
Patient Care Team: [name], MD as PCP - General (Family Medicine) Webb Laws, [place] as Referring Physician (Optometry) Rockwell Germany, RN as Oncology Nurse Navigator Mauro Kaufmann, RN as Oncology Nurse Navigator Nicholas Lose, MD as Consulting Physician (Hematology and Oncology) Kyung Rudd, MD as Consulting Physician (Radiation Oncology) Alphonsa Overall, MD as Consulting Physician (General Surgery)  DIAGNOSIS:    ICD-10-CM   1. Ductal carcinoma in situ (DCIS) of right breast  D05.11     SUMMARY OF ONCOLOGIC HISTORY: Oncology History  Ductal carcinoma in situ (DCIS) of right breast  07/04/2019 Initial Diagnosis   Routine screening mammogram detected 1.6cm mass in the right breast at the 9 o'clock position, loosely group calcifications in the upper right breast spanning 3.6cm, and no axillary adenopathy. Biopsy showed intermediate grade DCIS, ER 100%, PR 100%.    07/10/2019 Cancer Staging   Staging form: Breast, AJCC 8th Edition - Clinical stage from 07/10/2019: Stage 0 (cTis (DCIS), cN0, cM0, ER+, PR+, HER2: Not Assessed) - Signed by Nicholas Lose, MD on 07/10/2019   08/09/2019 Surgery   Right lumpectomy Lucia Gaskins): multifocal low grade DCIS spanning 1.4cm and 0.9cm, clear margins.    09/10/2019 -  Radiation Therapy   Adjuvant radiation therapy     CHIEF COMPLIANT: Follow-up to discuss anti-estrogen therapy  INTERVAL HISTORY: Susan Miles is a 73 y.o. with above-mentioned history of right breast DCIS who underwent a lumpectomy and is currently undergoing radiation treatment. She presents to the clinic today to discuss anti-estrogen therapy.  She is tolerating radiation extremely well.  She does have occasional discomfort in the surgical scars.  REVIEW OF SYSTEMS:   Constitutional: Denies fevers, chills or abnormal weight loss Eyes: Denies blurriness of vision Ears, nose, mouth, throat, and face: Denies mucositis or sore throat Respiratory: Denies cough, dyspnea or  wheezes Cardiovascular: Denies palpitation, chest discomfort Gastrointestinal: Denies nausea, heartburn or change in bowel habits Skin: Denies abnormal skin rashes Lymphatics: Denies new lymphadenopathy or easy bruising Neurological: Denies numbness, tingling or new weaknesses Behavioral/Psych: Mood is stable, no new changes  Extremities: No lower extremity edema Breast: Mild radiation dermatitis All other systems were reviewed with the patient and are negative.  I have reviewed the past medical history, past surgical history, social history and family history with the patient and they are unchanged from previous note.  ALLERGIES:  is allergic to ace inhibitors.  MEDICATIONS:  Current Outpatient Medications  Medication Sig Dispense Refill  . ACCU-CHEK AVIVA PLUS test strip USE TO TEST BLOOD SUGAR ONCE DAILY. DX E11.9 100 strip 1  . acetaminophen (TYLENOL) 500 MG tablet Take 500 mg by mouth every 6 (six) hours as needed for mild pain.    Marland Kitchen amLODipine (NORVASC) 10 MG tablet Take 1 tablet (10 mg total) by mouth daily. 90 tablet 1  . aspirin 81 MG tablet Take 81 mg by mouth daily.    Marland Kitchen atorvastatin (LIPITOR) 20 MG tablet Take 1 tablet (20 mg total) by mouth daily at 6 PM. 90 tablet 1  . Blood Glucose Monitoring Suppl (LDR BLOOD GLUCOSE TRUETEST) w/Device KIT 1 Device by Does not apply route daily. Test blood sugar once daily. Dx: E11.9 1 kit 0  . cetirizine (ZYRTEC) 10 MG tablet Take 1 tablet (10 mg total) by mouth daily. 90 tablet 1  . cloNIDine (CATAPRES) 0.1 MG tablet Take 1 tablet (0.1 mg total) by mouth 2 (two) times daily. 180 tablet 1  . cloNIDine (CATAPRES) 0.2 MG tablet TAKE 1 TABLET (  0.2 MG TOTAL) BY MOUTH 2 TIMES DAILY 180 tablet 1  . dapagliflozin propanediol (FARXIGA) 10 MG TABS tablet Take 10 mg by mouth daily. 90 tablet 1  . fluticasone (FLONASE) 50 MCG/ACT nasal spray Place 1-2 sprays into both nostrils daily. 16 g 6  . glipiZIDE (GLUCOTROL) 10 MG tablet TAKE 2 TABLETS (20 MG  TOTAL) BY MOUTH 2 (TWO) TIMES DAILY BEFORE A MEAL. 360 tablet 1  . HYDROcodone-acetaminophen (NORCO/VICODIN) 5-325 MG tablet Take 1 tablet by mouth every 6 (six) hours as needed for moderate pain. 15 tablet 0  . Lancets MISC Test blood sugar once daily. Dx: E11.9 100 each 3  . losartan (COZAAR) 100 MG tablet Take 1 tablet (100 mg total) by mouth daily. 90 tablet 1  . metFORMIN (GLUCOPHAGE) 1000 MG tablet Take 1 tablet (1,000 mg total) by mouth 2 (two) times daily with a meal. 180 tablet 1  . metoprolol (TOPROL-XL) 200 MG 24 hr tablet TAKE 1 TABLET (200 MG TOTAL) BY MOUTH DAILY. 90 tablet 1   No current facility-administered medications for this visit.     PHYSICAL EXAMINATION: ECOG PERFORMANCE STATUS: 1 - Symptomatic but completely ambulatory  Vitals:   09/30/19 1553  BP: (!) 141/80  Pulse: 77  Resp: 18  Temp: 99.1 F (37.3 C)  SpO2: 100%   There were no vitals filed for this visit.  GENERAL: alert, no distress and comfortable SKIN: skin color, texture, turgor are normal, no rashes or significant lesions EYES: normal, Conjunctiva are pink and non-injected, sclera clear OROPHARYNX: no exudate, no erythema and lips, buccal mucosa, and tongue normal  NECK: supple, thyroid normal size, non-tender, without nodularity LYMPH: no palpable lymphadenopathy in the cervical, axillary or inguinal LUNGS: clear to auscultation and percussion with normal breathing effort HEART: regular rate & rhythm and no murmurs and no lower extremity edema ABDOMEN: abdomen soft, non-tender and normal bowel sounds MUSCULOSKELETAL: no cyanosis of digits and no clubbing  NEURO: alert & oriented x 3 with fluent speech, no focal motor/sensory deficits EXTREMITIES: No lower extremity edema  LABORATORY DATA:  I have reviewed the data as listed CMP Latest Ref Rng & Units 08/06/2019 07/10/2019 05/09/2019  Glucose 70 - 99 mg/dL 173(H) 241(H) 143(H)  BUN 8 - 23 mg/dL _0 Creatinine 0.44 - 1.00 mg/dL 1.09(H) 1.24(H)  1.00  Sodium 135 - 145 mmol/L 138 138 140  Potassium 3.5 - 5.1 mmol/L 4.2 3.9 4.2  Chloride 98 - 111 mmol/L 105 105 106  CO2 22 - 32 mmol/L 22 20(L) 18(L)  Calcium 8.9 - 10.3 mg/dL 10.6(H) 10.4(H) 10.6(H)  Total Protein 6.5 - 8.1 g/dL - 7.3 6.8  Total Bilirubin 0.3 - 1.2 mg/dL - 0.6 0.4  Alkaline Phos 38 - 126 U/L - 70 70  AST 15 - 41 U/L - 11(L) 11  ALT 0 - 44 U/L - 10 12    Lab Results  Component Value Date   WBC 5.4 07/10/2019   HGB 13.1 07/10/2019   HCT 41.5 07/10/2019   MCV 85.7 07/10/2019   PLT 329 07/10/2019   NEUTROABS 3.0 07/10/2019    ASSESSMENT & PLAN:  Ductal carcinoma in situ (DCIS) of right breast 07/04/2019:Routine screening mammogram detected 1.6cm mass in the right breast at the 9 o'clock position, loosely group calcifications in the upper right breast spanning 3.6cm, and no axillary adenopathy. Biopsy showed intermediate grade DCIS, ER 100%, PR 100%. Tix Nx Stage 0 08/09/2019: Right lumpectomy: DCIS with calcifications, low-grade, 1.4 cm, ALH, margins  negative, second lumpectomy: DCIS with calcifications, low-grade, 0.9 cm, ER 100%, PR 100%, Tis NX stage 0  Treatment plan: 1.  Adjuvant radiation therapy will be completed 10/04/2019 2.  Adjuvant antiestrogen therapy with tamoxifen x5 years  Tamoxifen counseling:We discussed the risks and benefits of tamoxifen. These include but not limited to insomnia, hot flashes, mood changes, vaginal dryness, and weight gain. Although rare, serious side effects including endometrial cancer, risk of blood clots were also discussed. We strongly believe that the benefits far outweigh the risks. Patient understands these risks and consented to starting treatment. Planned treatment duration is 5 years.  Return to clinic in 3 months for survivorship care plan visit   No orders of the defined types were placed in this encounter.  The patient has a good understanding of the overall plan. she agrees with it. she will call with any  problems that may develop before the next visit here.  Nicholas Lose, MD 09/30/2019  Julious Oka Dorshimer am acting as scribe for Dr. Nicholas Lose.  I have reviewed the above documentation for accuracy and completeness, and I agree with the above.

## 2019-09-30 ENCOUNTER — Ambulatory Visit
Admission: RE | Admit: 2019-09-30 | Discharge: 2019-09-30 | Disposition: A | Discharge status: Home / Self Care | Payer: Medicare HMO | Source: Ambulatory Visit | Attending: Radiation Oncology | Admitting: Radiation Oncology

## 2019-09-30 ENCOUNTER — Inpatient Hospital Stay: Payer: Medicare HMO | Attending: Hematology and Oncology | Admitting: Hematology and Oncology

## 2019-09-30 ENCOUNTER — Other Ambulatory Visit: Payer: Self-pay

## 2019-09-30 DIAGNOSIS — Z7982 Long term (current) use of aspirin: Secondary | ICD-10-CM | POA: Diagnosis not present

## 2019-09-30 DIAGNOSIS — Z7984 Long term (current) use of oral hypoglycemic drugs: Secondary | ICD-10-CM | POA: Insufficient documentation

## 2019-09-30 DIAGNOSIS — Z79899 Other long term (current) drug therapy: Secondary | ICD-10-CM | POA: Insufficient documentation

## 2019-09-30 DIAGNOSIS — Z923 Personal history of irradiation: Secondary | ICD-10-CM | POA: Insufficient documentation

## 2019-09-30 DIAGNOSIS — Z170 Estrogen receptor positive status [ER+]: Secondary | ICD-10-CM | POA: Diagnosis not present

## 2019-09-30 DIAGNOSIS — D0511 Intraductal carcinoma in situ of right breast: Secondary | ICD-10-CM | POA: Diagnosis not present

## 2019-09-30 DIAGNOSIS — Z510 Encounter for antineoplastic radiation therapy: Secondary | ICD-10-CM | POA: Diagnosis not present

## 2019-09-30 MED ORDER — TAMOXIFEN CITRATE 20 MG PO TABS: 20.0000 mg | ORAL_TABLET | Freq: Every day | ORAL | 3 refills | Status: DC

## 2019-09-30 NOTE — Assessment & Plan Note (Signed)
07/04/2019:Routine screening mammogram detected 1.6cm mass in the right breast at the 9 o'clock position, loosely group calcifications in the upper right breast spanning 3.6cm, and no axillary adenopathy. Biopsy showed intermediate grade DCIS, ER 100%, PR 100%. Tix Nx Stage 0 08/09/2019: Right lumpectomy: DCIS with calcifications, low-grade, 1.4 cm, ALH, margins negative, second lumpectomy: DCIS with calcifications, low-grade, 0.9 cm, ER 100%, PR 100%, Tis NX stage 0  Treatment plan: 1.  Adjuvant radiation therapy followed by 2.  Adjuvant antiestrogen therapy with tamoxifen x5 years  Tamoxifen counseling:We discussed the risks and benefits of tamoxifen. These include but not limited to insomnia, hot flashes, mood changes, vaginal dryness, and weight gain. Although rare, serious side effects including endometrial cancer, risk of blood clots were also discussed. We strongly believe that the benefits far outweigh the risks. Patient understands these risks and consented to starting treatment. Planned treatment duration is 5 years.  Return to clinic in 3 months for survivorship care plan visit

## 2019-10-01 ENCOUNTER — Other Ambulatory Visit: Payer: Self-pay

## 2019-10-01 ENCOUNTER — Ambulatory Visit
Admission: RE | Admit: 2019-10-01 | Discharge: 2019-10-01 | Disposition: A | Discharge status: Home / Self Care | Payer: Medicare HMO | Source: Ambulatory Visit | Attending: Radiation Oncology | Admitting: Radiation Oncology

## 2019-10-01 ENCOUNTER — Telehealth: Payer: Self-pay | Admitting: Adult Health

## 2019-10-01 DIAGNOSIS — D0511 Intraductal carcinoma in situ of right breast: Secondary | ICD-10-CM | POA: Diagnosis not present

## 2019-10-01 DIAGNOSIS — Z510 Encounter for antineoplastic radiation therapy: Secondary | ICD-10-CM | POA: Diagnosis not present

## 2019-10-01 DIAGNOSIS — Z170 Estrogen receptor positive status [ER+]: Secondary | ICD-10-CM | POA: Diagnosis not present

## 2019-10-01 NOTE — Telephone Encounter (Signed)
I could not reach [facility] appointment. I will mail schedule

## 2019-10-02 ENCOUNTER — Ambulatory Visit
Admission: RE | Admit: 2019-10-02 | Discharge: 2019-10-02 | Disposition: A | Discharge status: Home / Self Care | Payer: Medicare HMO | Source: Ambulatory Visit | Attending: Radiation Oncology | Admitting: Radiation Oncology

## 2019-10-02 ENCOUNTER — Other Ambulatory Visit: Payer: Self-pay

## 2019-10-02 DIAGNOSIS — D0511 Intraductal carcinoma in situ of right breast: Secondary | ICD-10-CM | POA: Diagnosis not present

## 2019-10-02 DIAGNOSIS — Z170 Estrogen receptor positive status [ER+]: Secondary | ICD-10-CM | POA: Diagnosis not present

## 2019-10-02 DIAGNOSIS — Z510 Encounter for antineoplastic radiation therapy: Secondary | ICD-10-CM | POA: Diagnosis not present

## 2019-10-03 ENCOUNTER — Other Ambulatory Visit: Payer: Self-pay

## 2019-10-03 ENCOUNTER — Ambulatory Visit
Admission: RE | Admit: 2019-10-03 | Discharge: 2019-10-03 | Disposition: A | Discharge status: Home / Self Care | Payer: Medicare HMO | Source: Ambulatory Visit | Attending: Radiation Oncology | Admitting: Radiation Oncology

## 2019-10-03 DIAGNOSIS — Z510 Encounter for antineoplastic radiation therapy: Secondary | ICD-10-CM | POA: Diagnosis not present

## 2019-10-03 DIAGNOSIS — D0511 Intraductal carcinoma in situ of right breast: Secondary | ICD-10-CM | POA: Diagnosis not present

## 2019-10-03 DIAGNOSIS — Z170 Estrogen receptor positive status [ER+]: Secondary | ICD-10-CM | POA: Diagnosis not present

## 2019-10-04 ENCOUNTER — Ambulatory Visit
Admission: RE | Admit: 2019-10-04 | Discharge: 2019-10-04 | Disposition: A | Discharge status: Home / Self Care | Payer: Medicare HMO | Source: Ambulatory Visit | Attending: Radiation Oncology | Admitting: Radiation Oncology

## 2019-10-04 ENCOUNTER — Encounter: Payer: Self-pay | Admitting: Radiation Oncology

## 2019-10-04 ENCOUNTER — Encounter: Payer: Self-pay | Admitting: *Deleted

## 2019-10-04 ENCOUNTER — Other Ambulatory Visit: Payer: Self-pay

## 2019-10-04 DIAGNOSIS — Z510 Encounter for antineoplastic radiation therapy: Secondary | ICD-10-CM | POA: Diagnosis not present

## 2019-10-04 DIAGNOSIS — Z170 Estrogen receptor positive status [ER+]: Secondary | ICD-10-CM | POA: Diagnosis not present

## 2019-10-04 DIAGNOSIS — D0511 Intraductal carcinoma in situ of right breast: Secondary | ICD-10-CM | POA: Diagnosis not present

## 2019-10-07 IMAGING — MG MM PLC BREAST LOC DEV 1ST LESION INC*R*
8 series · 8 of 8 positions shown · non-contrast
Comparison: Previous exam(s).

CLINICAL DATA: Localization of DCIS in the lateral right breast
marked with an X shaped clip. Localization of the anterior extent of
calcifications marked with a coil shaped clip in the superior right
breast. Localization of the posterior aspect of the calcifications
not marked with a surgical clip.

EXAM:
MAMMOGRAPHIC GUIDED RADIOACTIVE SEED LOCALIZATION OF THE RIGHT
BREAST

[R ML (1 of 6)]
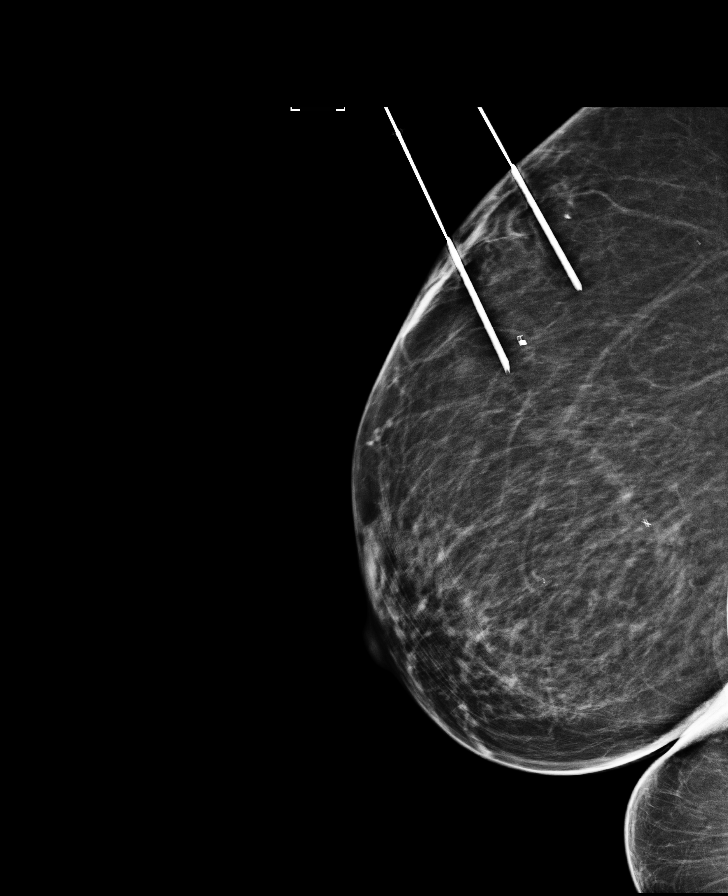

[R ML (2 of 6)]
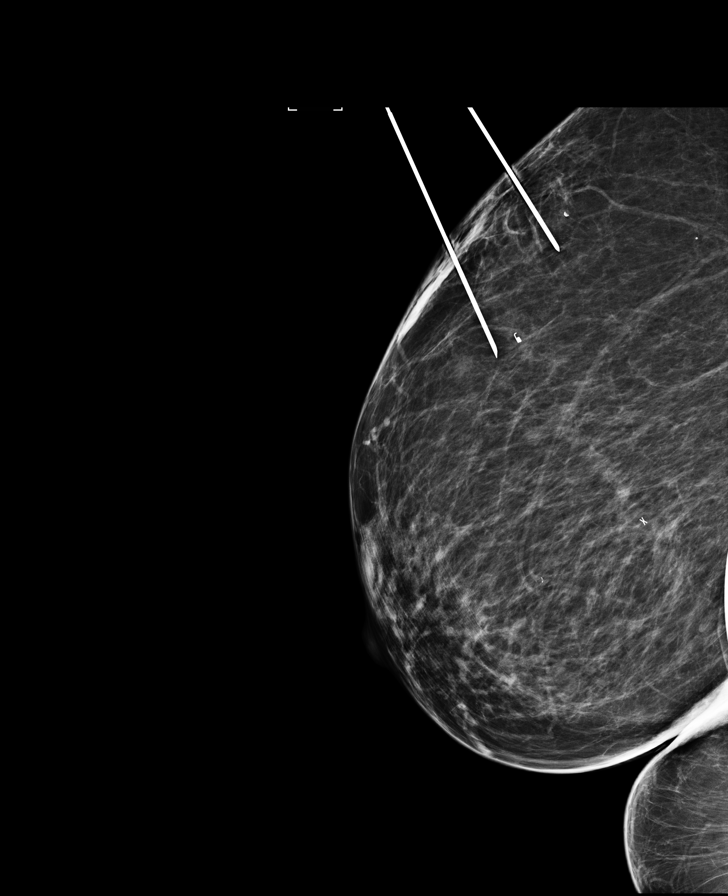

[R CC (1 of 2)]
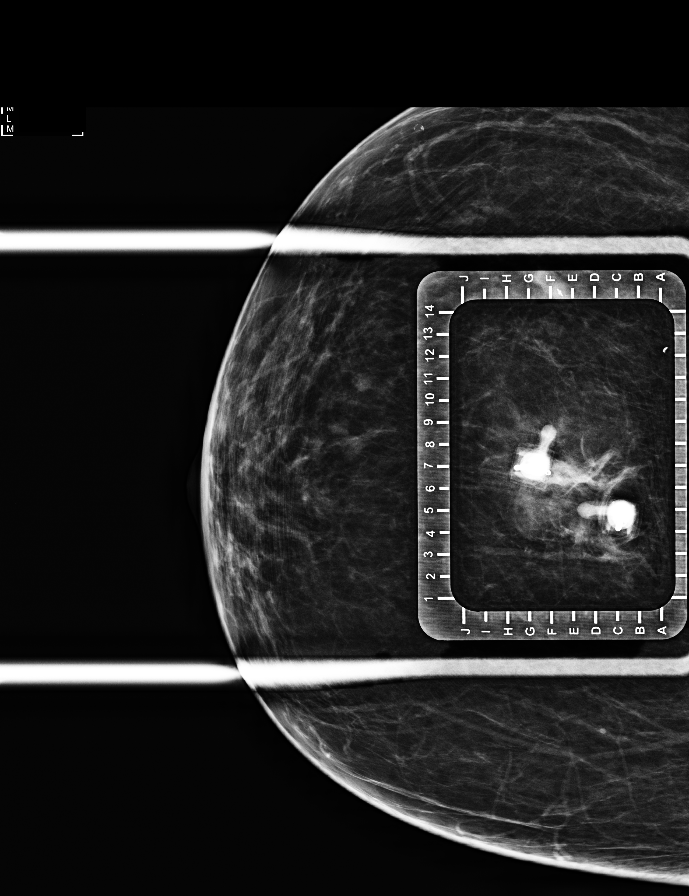

[R ML (3 of 6)]
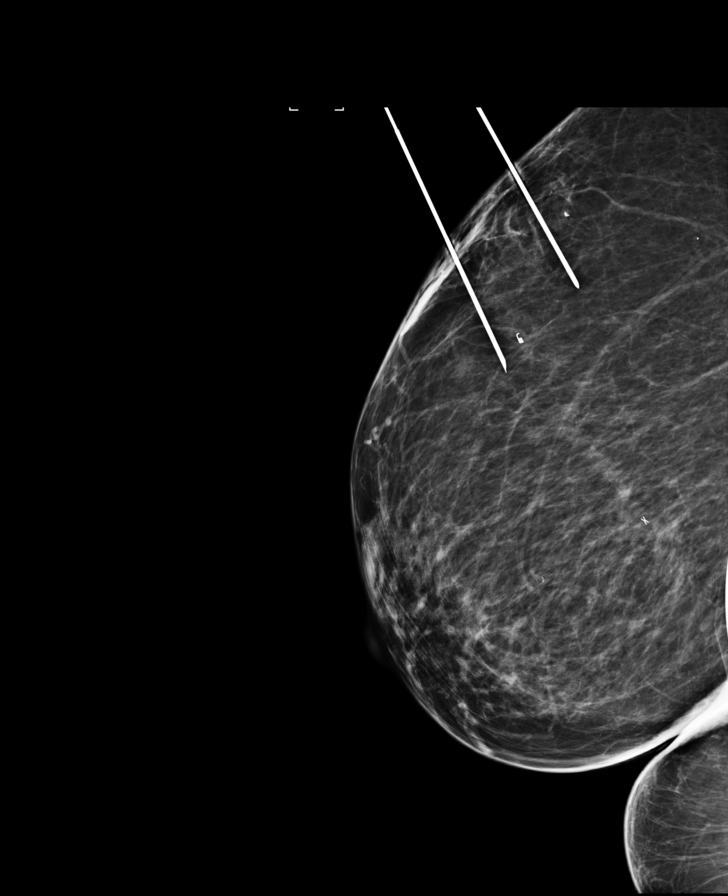

[R ML (4 of 6)]
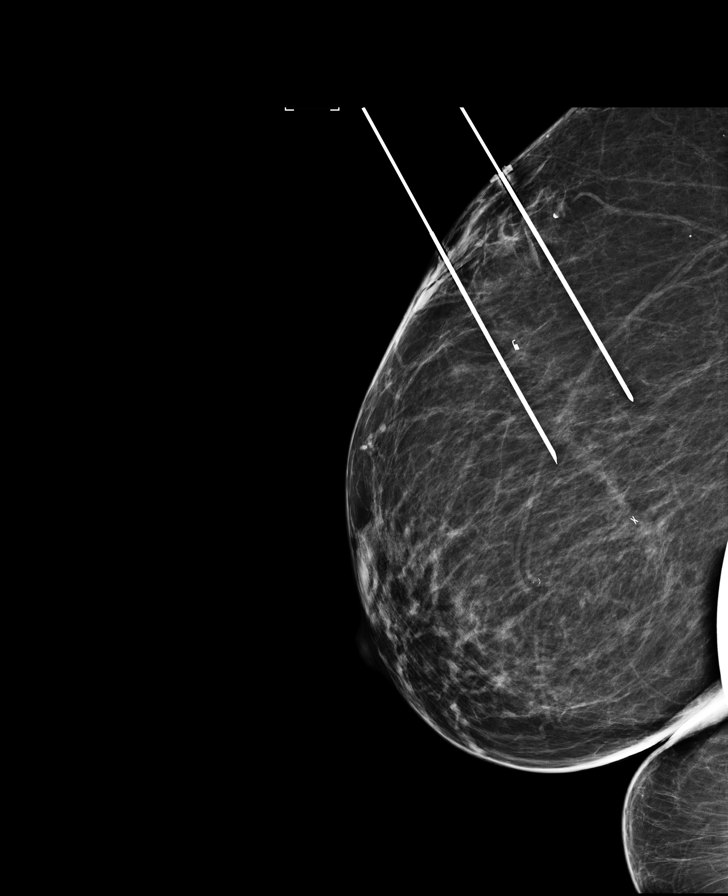

[R CC (2 of 2)]
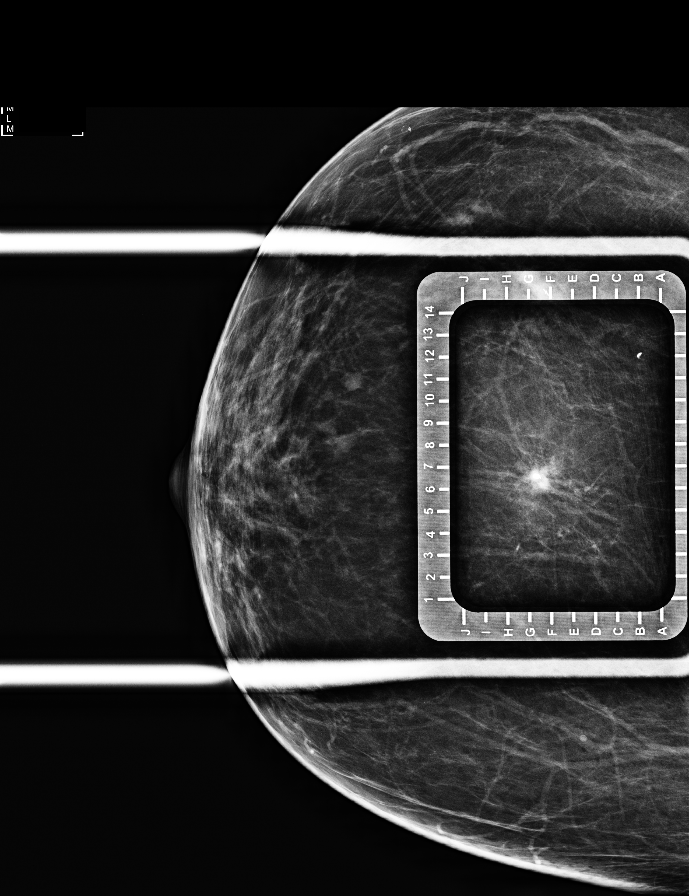

[R ML (5 of 6)]
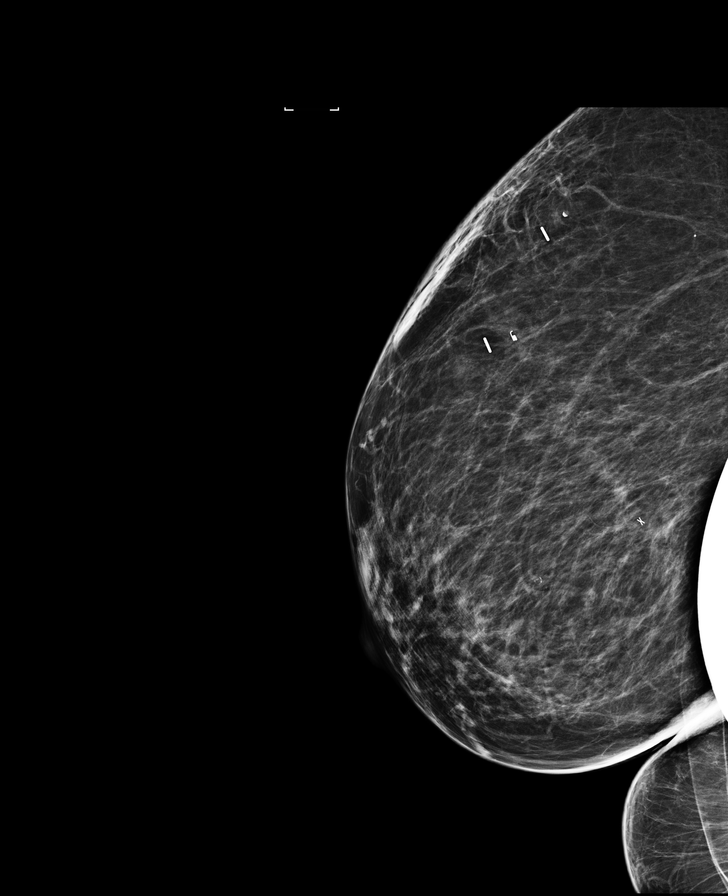

[R ML (6 of 6)]
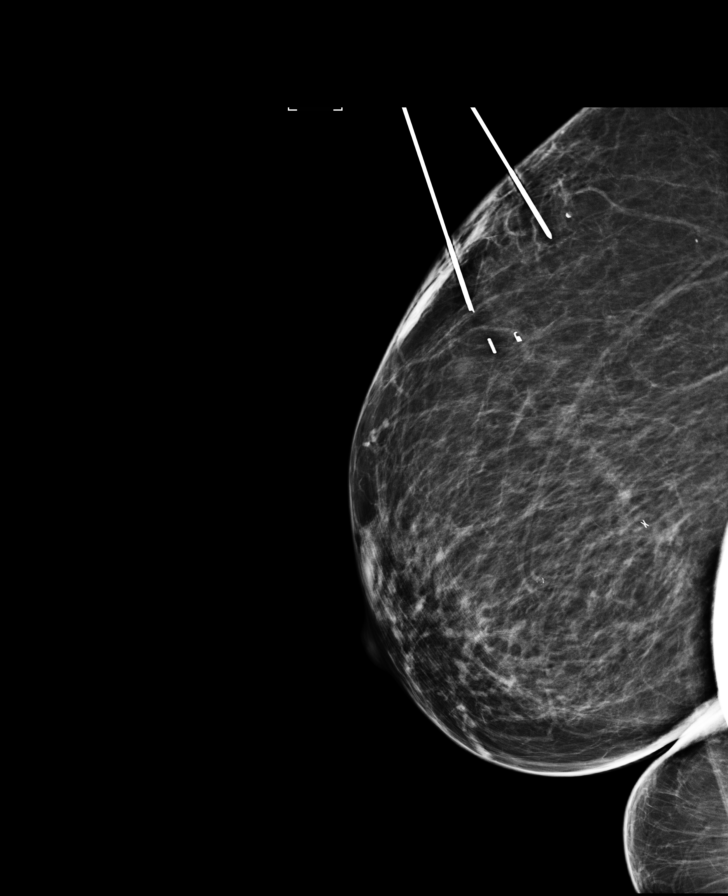

[8 of 8 positions shown; findings below may reference images not displayed]



The usual time-out protocol was performed immediately prior to the
procedure.

Using mammographic guidance, sterile technique, 1% lidocaine and an
U-BGX radioactive seed, the posterior calcifications were localized
using a superior approach. The follow-up mammogram images confirm
the seed in the expected location and were marked for the surgeon.

Follow-up survey of the patient confirms presence of the radioactive
seed.

Order number of U-BGX seed:  [id number].

Total activity:  0.245 millicuries reference Date: August 05, 2019

Using mammographic guidance, sterile technique, 1% lidocaine and an
U-BGX radioactive seed, the anterior aspect of the calcifications
were localized using a superior approach. The follow-up mammogram
images confirm the seed in the expected location and were marked for
the surgeon.

Follow-up survey of the patient confirms presence of the radioactive
seed.

Order number of U-BGX seed:  [id number].

Total activity:  0.245 millicuries reference Date: August 05, 2019

Using mammographic guidance, sterile technique, 1% lidocaine and an
U-BGX radioactive seed, was localized using a approach. The
follow-up mammogram images confirm the seed in the expected location
and were marked for Dr.

Follow-up survey of the patient confirms presence of the radioactive
seed.

Order number of U-BGX seed:  [id number].

Total activity:  0.253 millicuries reference Date: July 23, 2019

The patient tolerated the procedure well and was released from the
[REDACTED]. She was given instructions regarding seed removal.
IMPRESSION: Radioactive seed localization right breast. No apparent
complications.

## 2019-10-08 IMAGING — DX MM BREAST SURGICAL SPECIMEN
1 series · 2 of 2 positions shown · non-contrast
Comparison: Previous exam(s).

CLINICAL DATA: Specimen radiograph status post right breast
lumpectomy.

EXAM:
SPECIMEN RADIOGRAPH OF THE RIGHT BREAST

[Series 2: specimen digital x-ray, derived · right · 2 of 2 slices shown]
[im 1/2]
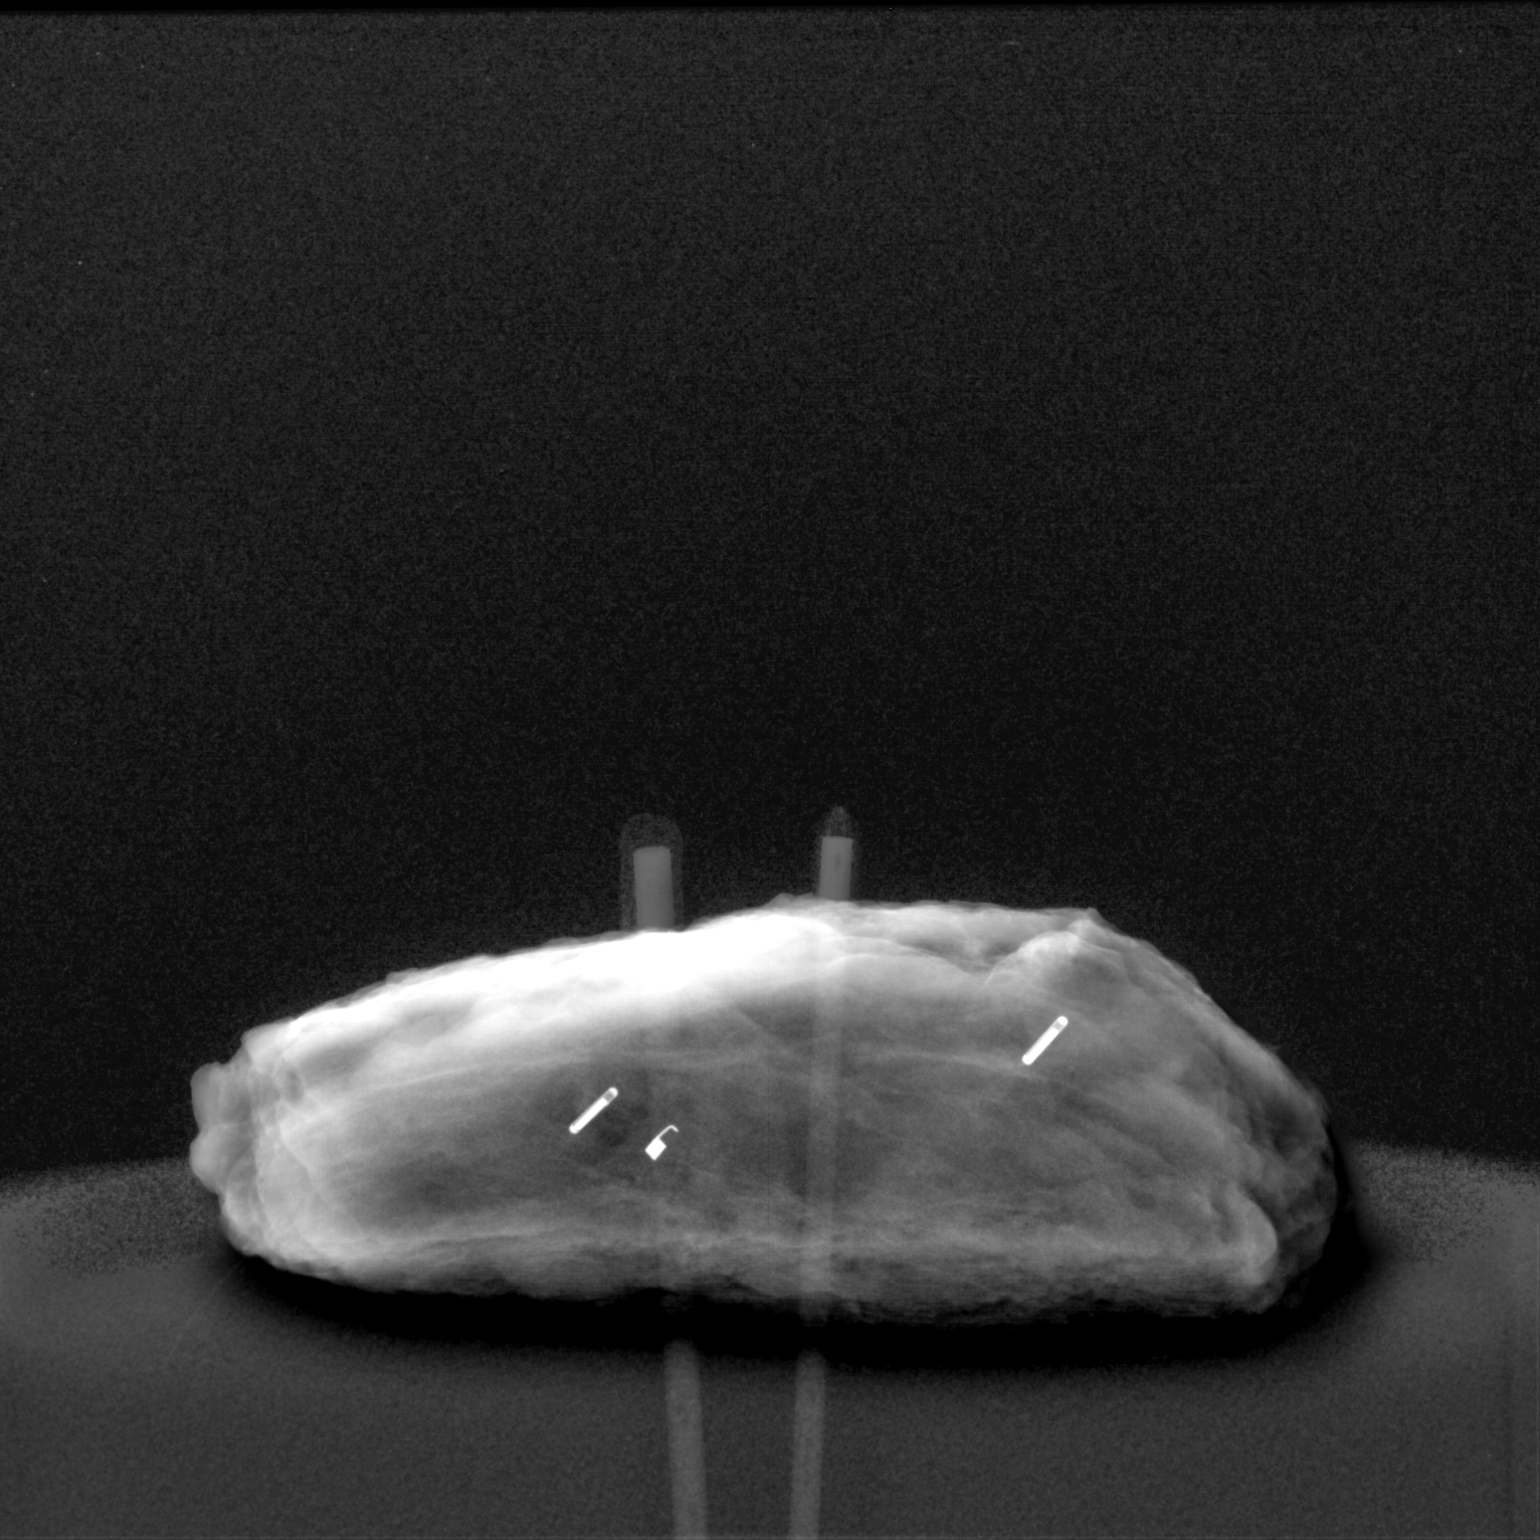
[im 2/2]
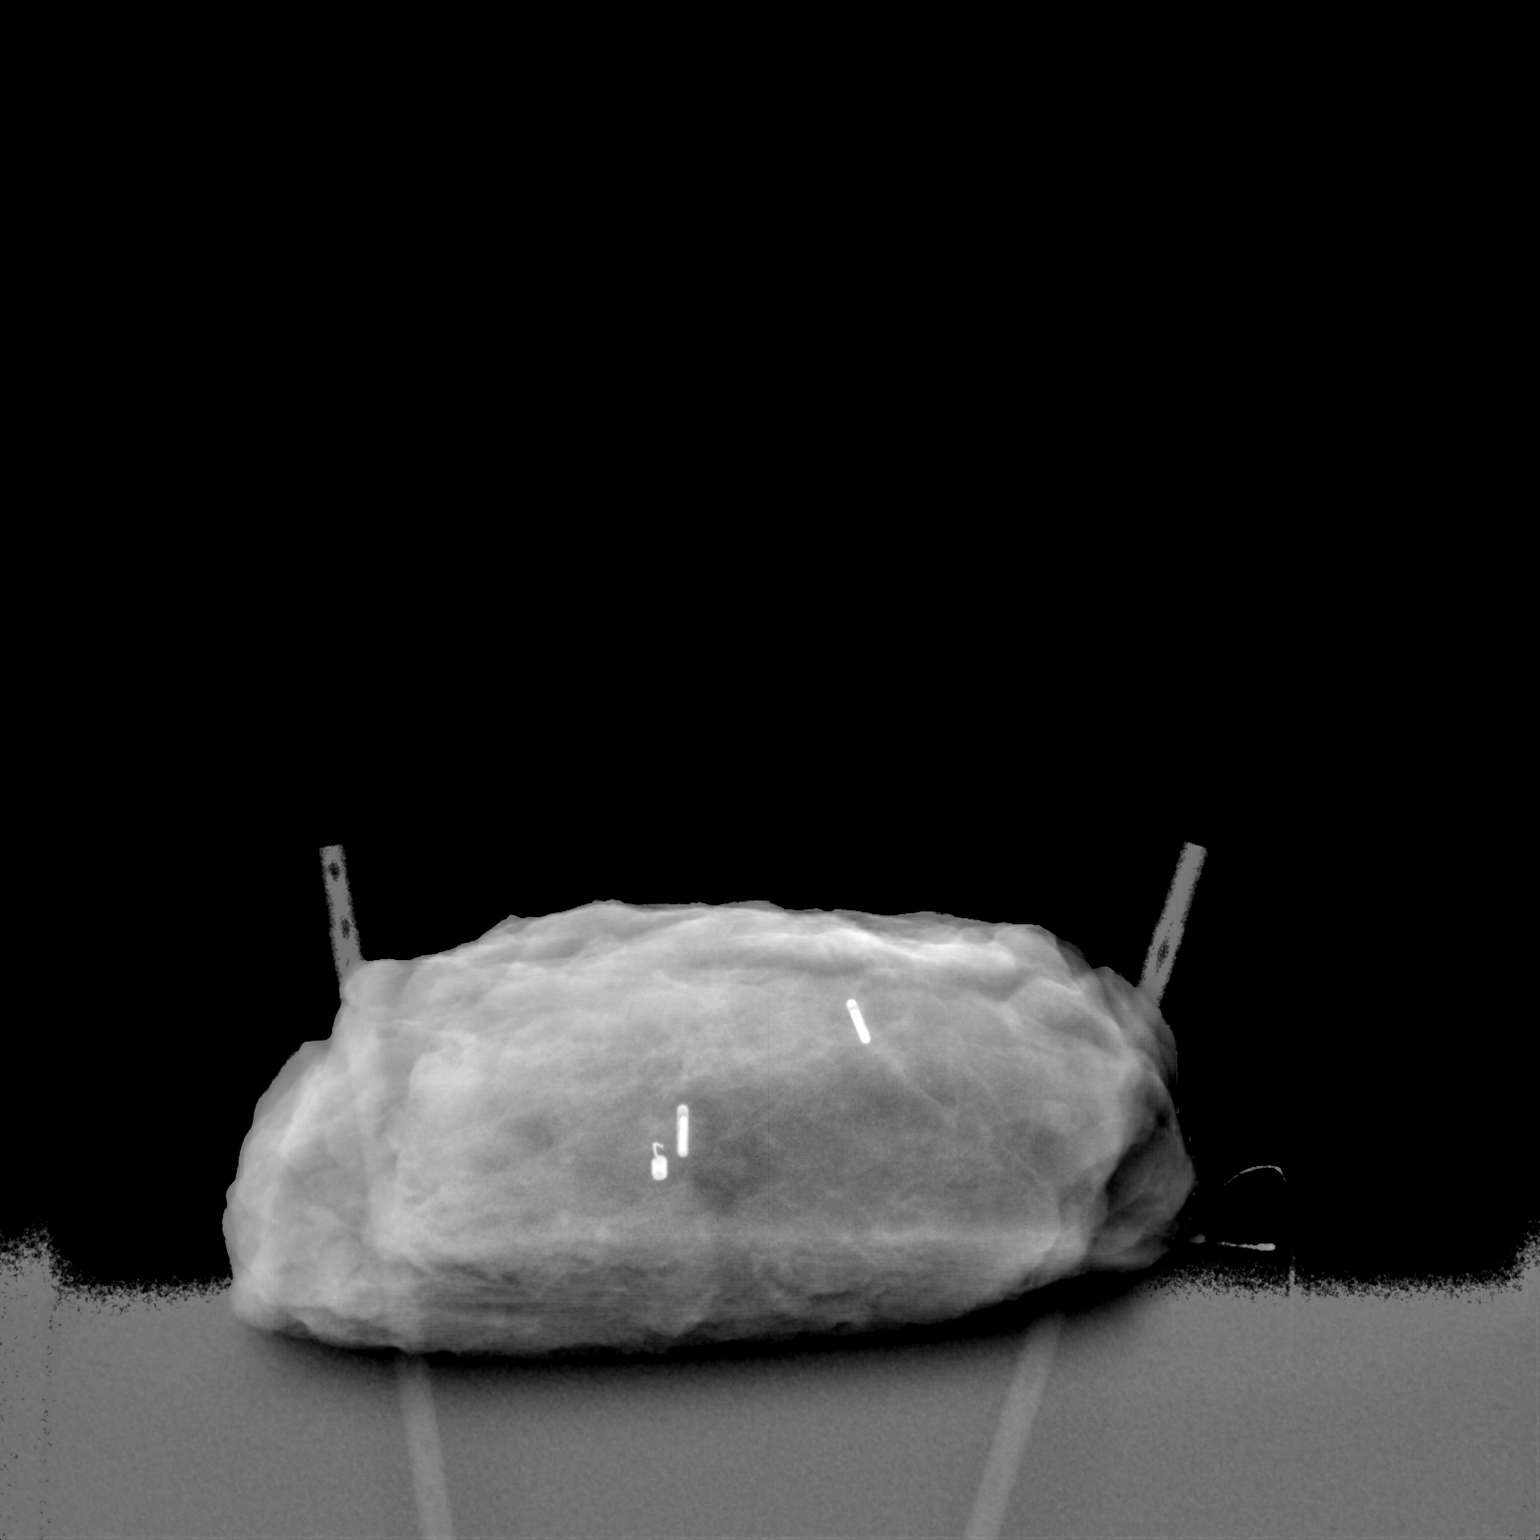

[2 of 2 positions shown; findings below may reference images not displayed]

FINDINGS: Status post excision of the right breast. The radioactive seeds and
coil shaped biopsy marker clip are present and completely intact.
These findings were communicated with the OR at [DATE] p.m.
IMPRESSION: Specimen radiograph of the right breast.

## 2019-10-08 IMAGING — DX MM BREAST SURGICAL SPECIMEN
1 series · 2 of 2 positions shown · non-contrast
Comparison: Previous exam(s).

CLINICAL DATA: Radioactive seed localization was performed of the
lateral right breast at the site X shaped biopsy clip, prior to
surgical excision.

EXAM:
SPECIMEN RADIOGRAPH OF THE RIGHT BREAST

[Series 2: specimen digital x-ray, derived · right · 2 of 2 slices shown]
[im 1/2]
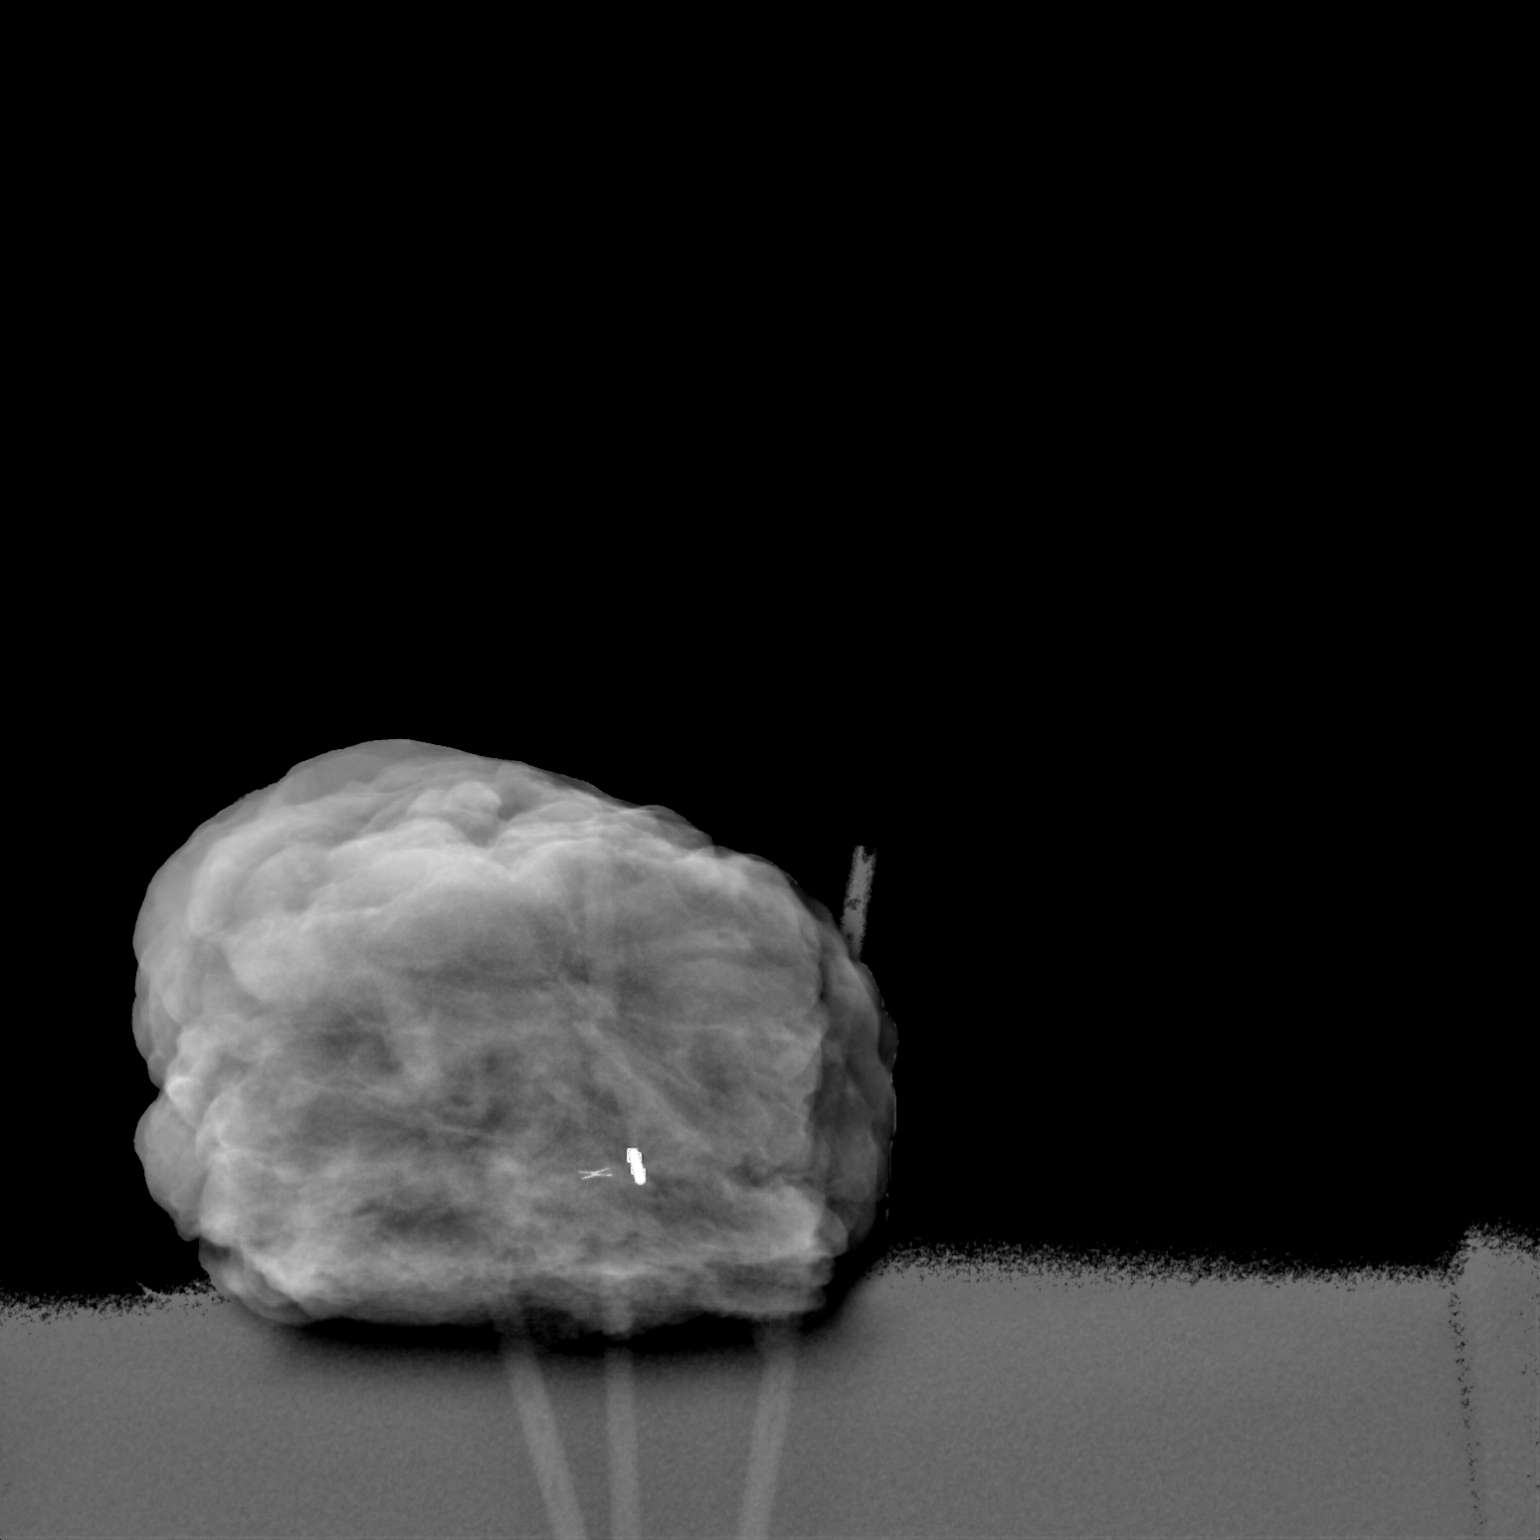
[im 2/2]
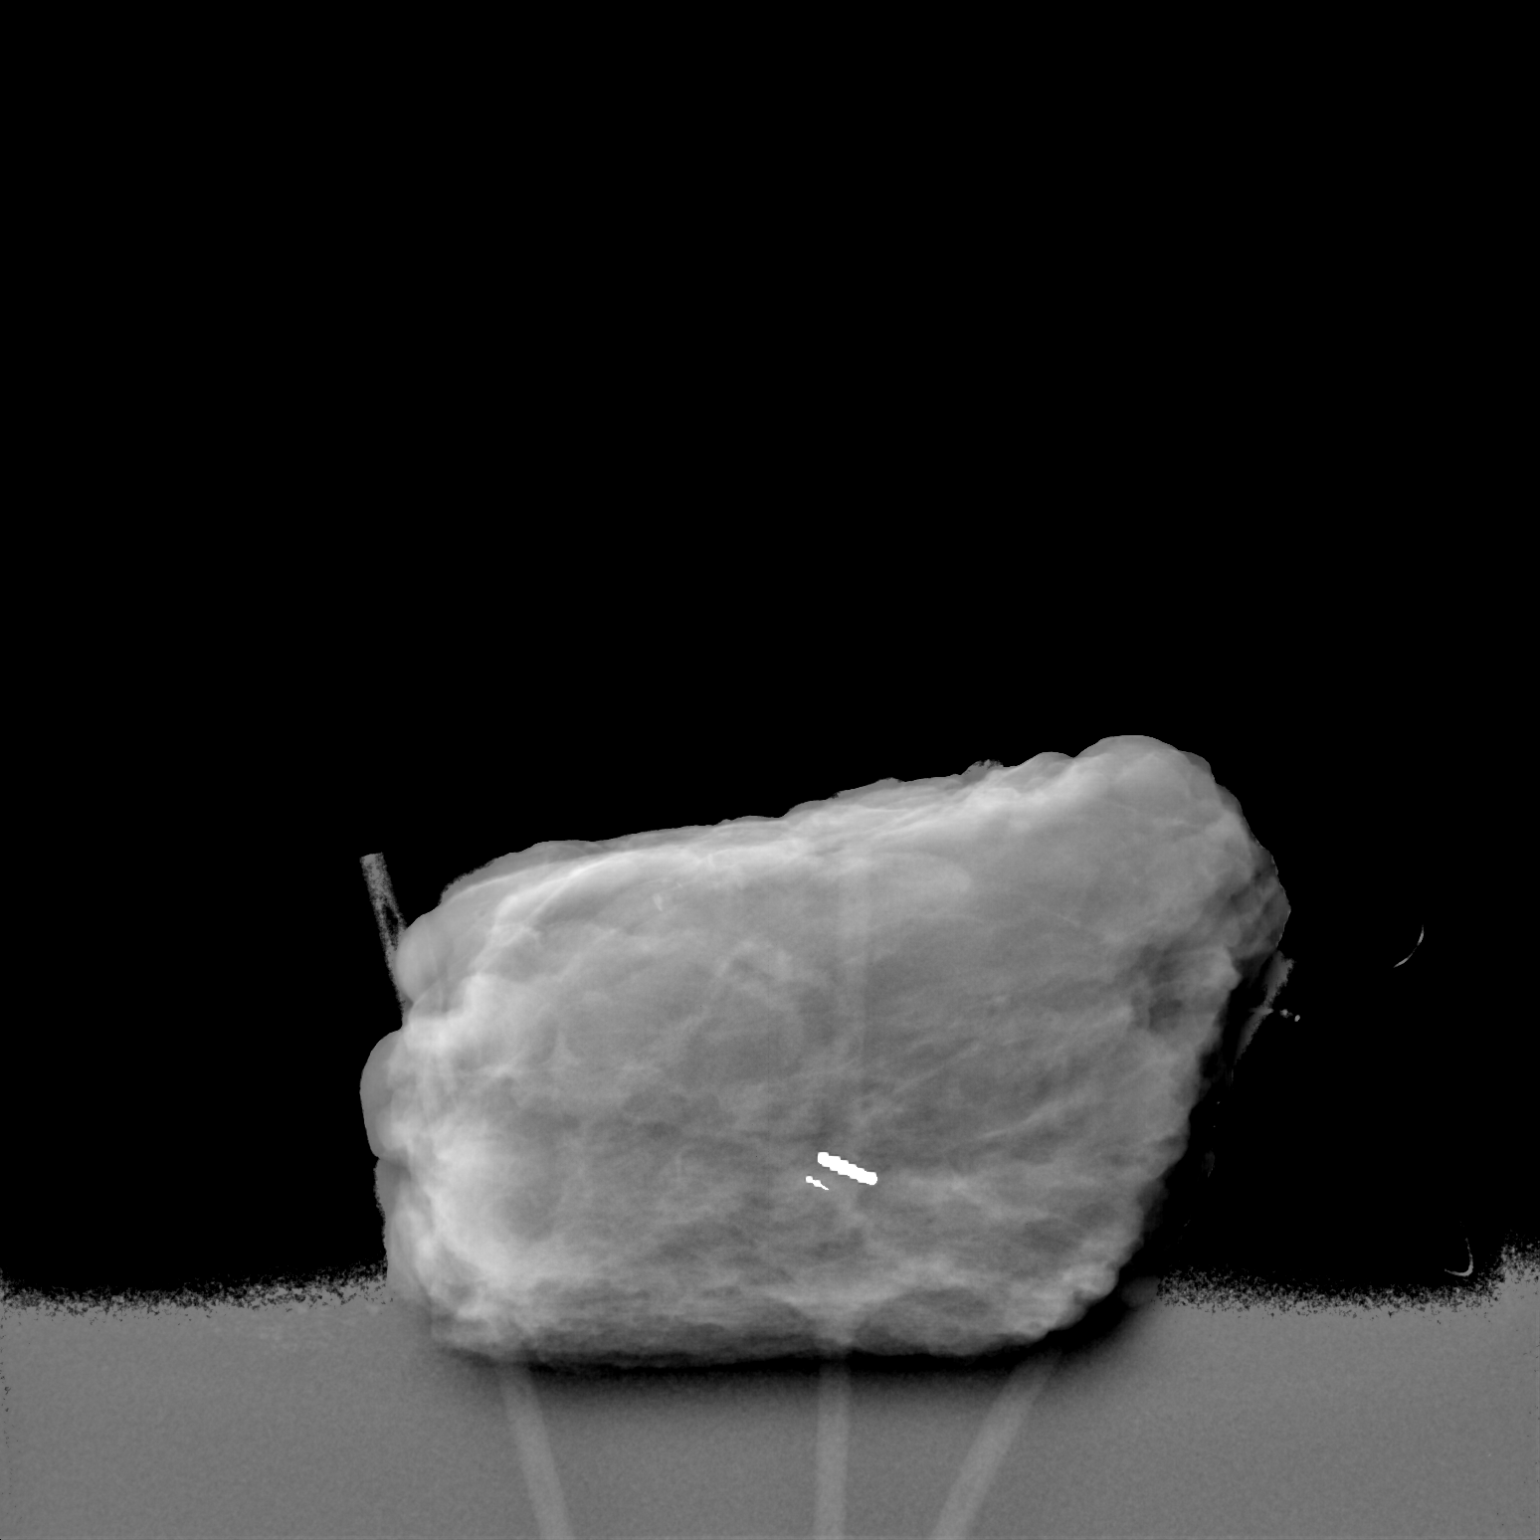

[2 of 2 positions shown; findings below may reference images not displayed]

FINDINGS: Status post excision of the right breast. The radioactive seed and X
shaped biopsy marker clip are present, completely intact, and were
marked for pathology.
IMPRESSION: Specimen radiograph of the right breast.

## 2019-10-24 NOTE — Progress Notes (Signed)
  Radiation Oncology         [phone]) [phone] ________________________________  Name: Susan Miles MRN: [id number]  Date: 10/04/2019  DOB: [birth year]  End of Treatment Note  Diagnosis:   right-sided breast cancer     Indication for treatment:  Curative       Radiation treatment dates:   09/09/19 - 10/04/19  Site/dose:   The patient initially received a dose of 42.56 Gy in 16 fractions to the breast using whole-breast tangent fields. This was delivered using a 3-D conformal technique. The patient then received a boost to the seroma. This delivered an additional 8 Gy in 24fractions using a 3 field photon technique due to the depth of the seroma. The total dose was 50.56 Gy.  Narrative: The patient tolerated radiation treatment relatively well.   The patient had some expected skin irritation as she progressed during treatment. Moist desquamation was not present at the end of treatment.  Plan: The patient has completed radiation treatment. The patient will return to radiation oncology clinic for routine followup in one month. I advised the patient to call or return sooner if they have any questions or concerns related to their recovery or treatment. ________________________________  Jodelle Gross, M.D., Ph.D.

## 2019-11-11 ENCOUNTER — Telehealth: Payer: Self-pay | Admitting: Radiation Oncology

## 2019-11-11 NOTE — Telephone Encounter (Signed)
  Radiation Oncology         [phone]) [phone] ________________________________  Name: Susan Miles MRN: [id number]  Date of Service: 11/11/2019  DOB: January 18, 1946  Post Treatment Telephone Note  Diagnosis:  Low grade ER/PR positive DCIS of the right breast  Interval Since Last Radiation: 6 weeks   09/09/19 - 10/04/19: The patient initially received a dose of 42.56 Gy in 16 fractions to the right breast using whole-breast tangent fields. This was delivered using a 3-D conformal technique. The patient then received a boost to the seroma. This delivered an additional 8 Gy in 6fractions using a 3 field photon technique due to the depth of the seroma. The total dose was 50.56 Gy.  Narrative:  The patient was contacted today for routine follow-up. During treatment she did very well with radiotherapy and did not have significant desquamation. She reports she is doing well and her skin is healing up quite well. She still has some dry and itchy areas on the right breast but no open areas.  Impression/Plan: 1. Low grade ER/PR positive DCIS of the right breast. The patient has been doing well since completion of radiotherapy. We discussed that we would be happy to continue to follow her as needed, but she will also continue to follow up with Dr. Lindi Adie in medical oncology. She was counseled on skin care as well as measures to avoid sun exposure to this area.  2. Survivorship. We discussed the importance of survivorship evaluation and is scheduled in February 2021.     Carola Rhine, PAC

## 2019-11-14 ENCOUNTER — Other Ambulatory Visit: Payer: Self-pay

## 2019-11-14 ENCOUNTER — Encounter: Payer: Self-pay | Admitting: Family Medicine

## 2019-11-14 ENCOUNTER — Ambulatory Visit (INDEPENDENT_AMBULATORY_CARE_PROVIDER_SITE_OTHER): Payer: Medicare HMO | Admitting: Family Medicine

## 2019-11-14 VITALS — BP 164/83 | HR 72 | Temp 98.6°F | Resp 12 | Ht 64.0 in | Wt 190.0 lb

## 2019-11-14 DIAGNOSIS — L03316 Cellulitis of umbilicus: Secondary | ICD-10-CM

## 2019-11-14 DIAGNOSIS — E1129 Type 2 diabetes mellitus with other diabetic kidney complication: Secondary | ICD-10-CM | POA: Diagnosis not present

## 2019-11-14 DIAGNOSIS — I10 Essential (primary) hypertension: Secondary | ICD-10-CM | POA: Diagnosis not present

## 2019-11-14 DIAGNOSIS — E782 Mixed hyperlipidemia: Secondary | ICD-10-CM

## 2019-11-14 DIAGNOSIS — R809 Proteinuria, unspecified: Secondary | ICD-10-CM | POA: Diagnosis not present

## 2019-11-14 MED ORDER — LOSARTAN POTASSIUM 100 MG PO TABS: 100.0000 mg | ORAL_TABLET | Freq: Every day | ORAL | 1 refills | Status: DC

## 2019-11-14 MED ORDER — DOXYCYCLINE HYCLATE 100 MG PO TABS: 100.0000 mg | ORAL_TABLET | Freq: Two times a day (BID) | ORAL | 0 refills | Status: DC

## 2019-11-14 MED ORDER — CLONIDINE HCL 0.2 MG PO TABS: 0.4000 mg | ORAL_TABLET | Freq: Two times a day (BID) | ORAL | 1 refills | Status: DC

## 2019-11-14 MED ORDER — METOPROLOL SUCCINATE ER 200 MG PO TB24: ORAL_TABLET | ORAL | 1 refills | Status: DC

## 2019-11-14 MED ORDER — AMLODIPINE BESYLATE 10 MG PO TABS: 10.0000 mg | ORAL_TABLET | Freq: Every day | ORAL | 1 refills | Status: DC

## 2019-11-14 MED ORDER — GLIPIZIDE 10 MG PO TABS: ORAL_TABLET | ORAL | 1 refills | Status: DC

## 2019-11-14 MED ORDER — FARXIGA 10 MG PO TABS: 10.0000 mg | ORAL_TABLET | Freq: Every day | ORAL | 1 refills | Status: DC

## 2019-11-14 MED ORDER — METFORMIN HCL 1000 MG PO TABS: 1000.0000 mg | ORAL_TABLET | Freq: Two times a day (BID) | ORAL | 1 refills | Status: DC

## 2019-11-14 MED ORDER — ATORVASTATIN CALCIUM 20 MG PO TABS: 20.0000 mg | ORAL_TABLET | Freq: Every day | ORAL | 1 refills | Status: DC

## 2019-11-14 NOTE — Patient Instructions (Addendum)
Try higher dose of 0.4mg  clonidine twice per day (2 of the 0.2mg ) until follow up with Dr. Carolin Sicks. No other changes for now. recheck in 3 months.   Umbilical area appears okay right now, but if any increased drainage, soreness or redness of the area start antibiotic right away.  If that does not improve your symptoms within a few days then recommend in office evaluation or emergency room  Return to the clinic or go to the nearest emergency room if any of your symptoms worsen or new symptoms occur.   Cellulitis, Adult  Cellulitis is a skin infection. The infected area is usually warm, red, swollen, and tender. This condition occurs most often in the arms and lower legs. The infection can travel to the muscles, blood, and underlying tissue and become serious. It is very important to get treated for this condition. What are the causes? Cellulitis is caused by bacteria. The bacteria enter through a break in the skin, such as a cut, burn, insect bite, open sore, or crack. What increases the risk? This condition is more likely to occur in people who:  Have a weak body defense system (immune system).  Have open wounds on the skin, such as cuts, burns, bites, and scrapes. Bacteria can enter the body through these open wounds.  Are older than 73 years of age.  Have diabetes.  Have a type of long-lasting (chronic) liver disease (cirrhosis) or kidney disease.  Are obese.  Have a skin condition such as: ? Itchy rash (eczema). ? Slow movement of blood in the veins (venous stasis). ? Fluid buildup below the skin (edema).  Have had radiation therapy.  Use IV drugs. What are the signs or symptoms? Symptoms of this condition include:  Redness, streaking, or spotting on the skin.  Swollen area of the skin.  Tenderness or pain when an area of the skin is touched.  Warm skin.  A fever.  Chills.  Blisters. How is this diagnosed? This condition is diagnosed based on a medical history  and physical exam. You may also have tests, including:  Blood tests.  Imaging tests. How is this treated? Treatment for this condition may include:  Medicines, such as antibiotic medicines or medicines to treat allergies (antihistamines).  Supportive care, such as rest and application of cold or warm cloths (compresses) to the skin.  Hospital care, if the condition is severe. The infection usually starts to get better within 1-2 days of treatment. Follow these instructions at home:  Medicines  Take over-the-counter and prescription medicines only as told by your health care provider.  If you were prescribed an antibiotic medicine, take it as told by your health care provider. Do not stop taking the antibiotic even if you start to feel better. General instructions  Drink enough fluid to keep your urine pale yellow.  Do not touch or rub the infected area.  Raise (elevate) the infected area above the level of your heart while you are sitting or lying down.  Apply warm or cold compresses to the affected area as told by your health care provider.  Keep all follow-up visits as told by your health care provider. This is important. These visits let your health care provider make sure a more serious infection is not developing. Contact a health care provider if:  You have a fever.  Your symptoms do not begin to improve within 1-2 days of starting treatment.  Your bone or joint underneath the infected area becomes painful after the skin  has healed.  Your infection returns in the same area or another area.  You notice a swollen bump in the infected area.  You develop new symptoms.  You have a general ill feeling (malaise) with muscle aches and pains. Get help right away if:  Your symptoms get worse.  You feel very sleepy.  You develop vomiting or diarrhea that persists.  You notice red streaks coming from the infected area.  Your red area gets larger or turns dark in  color. These symptoms may represent a serious problem that is an emergency. Do not wait to see if the symptoms will go away. Get medical help right away. Call your local emergency services (911 in the [place] Do not drive yourself to the hospital. Summary  Cellulitis is a skin infection. This condition occurs most often in the arms and lower legs.  Treatment for this condition may include medicines, such as antibiotic medicines or antihistamines.  Take over-the-counter and prescription medicines only as told by your health care provider. If you were prescribed an antibiotic medicine, do not stop taking the antibiotic even if you start to feel better.  Contact a health care provider if your symptoms do not begin to improve within 1-2 days of starting treatment or your symptoms get worse.  Keep all follow-up visits as told by your health care provider. This is important. These visits let your health care provider make sure that a more serious infection is not developing. This information is not intended to replace advice given to you by your health care provider. Make sure you discuss any questions you have with your health care provider. Document Released: 08/31/2005 Document Revised: 04/12/2018 Document Reviewed: 04/12/2018 Elsevier Patient Education  2020 [employer].

## 2019-11-14 NOTE — Progress Notes (Signed)
Subjective:  Patient ID: Susan Miles, female    DOB: [birth year]  Age: 73 y.o. MRN: [id number]  CC:  Chief Complaint  Patient presents with  . Hypertension  . Diabetes    Pt stated check glucose=142    HPI Susan Miles presents for medication follow-up  Ductal carcinoma in situ of right breast Oncology Dr. Jodell Cipro, appointment October 26.  Currently on tamoxifen 20 mg daily that was started at that visit.  Previous lumpectomy and radiation treatment.  Hypertension: Toprol-XL 200 mg daily, amlodipine 10 mg daily, clonidine 0.60m total BID.no new side effects with meds.  Home readings: running higher at home - 150's/90.  BP Readings from Last 3 Encounters:  11/14/19 (!) 164/83  09/30/19 (!) 141/80  08/16/19 136/74   Lab Results  Component Value Date   CREATININE 1.09 (H) 08/06/2019   Diabetes: Complicated by microalbuminuria, CKD.  appt with nephrology in July, Dr BCarolin Sicks appt in next month or so.  Discussed on telemedicine visit June 1.  Low blood sugar of 99, usually 12/25/1928 up to 139.  Glipizide 20 mg twice daily, Metformin 1000 mg twice daily, Farxiga 10 mg daily.  She is on ARB losartan, Lipitor 20 mg for statin. Home readings: low 69 - few months ago. Slight queasy feeling if under 100. Recent lows in 90's only. High 189. Fasting - usually 120-130.  Wt Readings from Last 3 Encounters:  11/14/19 190 lb (86.2 kg)  08/16/19 187 lb 12.8 oz (85.2 kg)  08/09/19 188 lb 11.4 oz (85.6 kg)  recently started back to exercise since breast surgery.   Microalbumin: Elevated ratio 59 March Optho, foot exam, pneumovax:  Foot exam today, ophthalmology exam was delayed due to pandemic.plans to schedule optho appt.  Flu vaccine: had last month or so.  Diabetic Foot Exam - Simple   Simple Foot Form Diabetic Foot exam was performed with the following findings: Yes 11/14/2019  5:45 PM  Visual Inspection Sensation Testing Pulse Check Comments      Lab Results    Component Value Date   HGBA1C 7.4 (H) 05/09/2019   HGBA1C 7.7 (A) 02/07/2019   HGBA1C 7.6 (H) 10/08/2018   Lab Results  Component Value Date   MICROALBUR 7.5 03/23/2016   LDLCALC 73 05/09/2019   CREATININE 1.09 (H) 08/06/2019   Hyperlipidemia: Lipitor 20 mg daily. Lab Results  Component Value Date   CHOL 127 05/09/2019   HDL 42 05/09/2019   LDLCALC 73 05/09/2019   TRIG 60 05/09/2019   CHOLHDL 3.0 05/09/2019   Lab Results  Component Value Date   ALT 10 07/10/2019   AST 11 (L) 07/10/2019   ALKPHOS 70 07/10/2019   BILITOT 0.6 056/43/3295  Umbilical irritation: Every 3-4 months, irritation with some drainage. Treated with abx in past. Current sx's noted yesterday- slight drainage - less now, some now., no fever. No pain at this time. Treated with doxycycline in past.   History Patient Active Problem List   Diagnosis Date Noted  . Family history of breast cancer   . Ductal carcinoma in situ (DCIS) of right breast 07/04/2019  . Hyperlipidemia 12/21/2017  . Breast asymmetry 05/01/2015  . Cataract, nuclear 05/24/2012  . HTN (hypertension) 02/10/2012  . DM2 (diabetes mellitus, type 2) ([facility] 02/10/2012   Past Medical History:  Diagnosis Date  . [facility] ([facility]    right breast cancer  . [place]   . Family history of breast cancer   . Hypertension   .  Sleep apnea    does not use CPAP   Past Surgical History:  Procedure Laterality Date  . ABDOMINAL HYSTERECTOMY    . BREAST LUMPECTOMY WITH RADIOACTIVE SEED LOCALIZATION Right 08/09/2019   Procedure: RIGHT BREAST LUMPECTOMY X 2 WITH RADIOACTIVE SEED LOCALIZATION (3 SEEDS);  Surgeon: Alphonsa Overall, MD;  Location: [facility] [place];  Service: General;  Laterality: Right;   Allergies  Allergen Reactions  . Ace Inhibitors Cough   Prior to Admission medications   Medication Sig Start Date End Date Taking? Authorizing Provider  ACCU-CHEK AVIVA PLUS test strip USE TO TEST BLOOD SUGAR  ONCE DAILY. DX E11.9 07/22/19  Yes [name], MD  acetaminophen (TYLENOL) 500 MG tablet Take 500 mg by mouth every 6 (six) hours as needed for mild pain.   Yes [provider]  amLODipine (NORVASC) 10 MG tablet Take 1 tablet (10 mg total) by mouth daily. 05/06/19  Yes [name], MD  aspirin 81 MG tablet Take 81 mg by mouth daily.   Yes [provider]  atorvastatin (LIPITOR) 20 MG tablet Take 1 tablet (20 mg total) by mouth daily at 6 PM. 05/06/19  Yes [name], MD  Blood Glucose Monitoring Suppl (LDR BLOOD GLUCOSE TRUETEST) w/Device KIT 1 Device by Does not apply route daily. Test blood sugar once daily. Dx: E11.9 01/24/18  Yes [name], MD  cetirizine (ZYRTEC) 10 MG tablet Take 1 tablet (10 mg total) by mouth daily. 09/21/15  Yes [name], MD  cloNIDine (CATAPRES) 0.1 MG tablet Take 1 tablet (0.1 mg total) by mouth 2 (two) times daily. 05/06/19  Yes [name], MD  cloNIDine (CATAPRES) 0.2 MG tablet TAKE 1 TABLET (0.2 MG TOTAL) BY MOUTH 2 TIMES DAILY 05/06/19  Yes [name], MD  dapagliflozin propanediol (FARXIGA) 10 MG TABS tablet Take 10 mg by mouth daily. 05/06/19  Yes [name], MD  fluticasone (FLONASE) 50 MCG/ACT nasal spray Place 1-2 sprays into both nostrils daily. 04/02/18  Yes [name], MD  glipiZIDE (GLUCOTROL) 10 MG tablet TAKE 2 TABLETS (20 MG TOTAL) BY MOUTH 2 (TWO) TIMES DAILY BEFORE A MEAL. 05/06/19  Yes [name], MD  HYDROcodone-acetaminophen (NORCO/VICODIN) 5-325 MG tablet Take 1 tablet by mouth every 6 (six) hours as needed for moderate pain. 08/09/19  Yes Alphonsa Overall, MD  Lancets MISC Test blood sugar once daily. Dx: E11.9 01/24/18  Yes [name], MD  losartan (COZAAR) 100 MG tablet Take 1 tablet (100 mg total) by mouth daily. 05/06/19  Yes [name], MD  metFORMIN (GLUCOPHAGE) 1000 MG tablet Take 1 tablet (1,000 mg total) by mouth 2 (two) times daily with a meal. 05/06/19  Yes  [name], MD  metoprolol (TOPROL-XL) 200 MG 24 hr tablet TAKE 1 TABLET (200 MG TOTAL) BY MOUTH DAILY. 05/06/19  Yes [name], MD  tamoxifen (NOLVADEX) 20 MG tablet Take 1 tablet (20 mg total) by mouth daily. 10/20/19  Yes Nicholas Lose, MD   Social History   Socioeconomic History  . Marital status: Married    Spouse name: Not on file  . Number of children: Not on file  . Years of education: Not on file  . Highest education level: Not on file  Occupational History  . Not on file  Tobacco Use  . Smoking status: Never Smoker  . Smokeless tobacco: Never Used  Substance and Sexual Activity  . Alcohol use: No  . Drug  use: No  . Sexual activity: Not on file  Other Topics Concern  . Not on file  Social History Narrative  . Not on file   Social Determinants of Health   Financial Resource Strain:   . Difficulty of Paying Living Expenses: Not on file  Food Insecurity:   . Worried About Charity fundraiser in the Last Year: Not on file  . Ran Out of Food in the Last Year: Not on file  Transportation Needs:   . Lack of Transportation (Medical): Not on file  . Lack of Transportation (Non-Medical): Not on file  Physical Activity:   . Days of Exercise per Week: Not on file  . Minutes of Exercise per Session: Not on file  Stress:   . Feeling of Stress : Not on file  Social Connections:   . Frequency of Communication with Friends and Family: Not on file  . Frequency of Social Gatherings with Friends and Family: Not on file  . Attends Religious Services: Not on file  . Active Member of Clubs or Organizations: Not on file  . Attends Archivist Meetings: Not on file  . Marital Status: Not on file  Intimate Partner Violence:   . Fear of Current or Ex-Partner: Not on file  . Emotionally Abused: Not on file  . Physically Abused: Not on file  . Sexually Abused: Not on file    Review of Systems  Constitutional: Negative for fatigue and unexpected weight change.   Respiratory: Negative for chest tightness and shortness of breath.   Cardiovascular: Negative for chest pain, palpitations and leg swelling.  Gastrointestinal: Negative for abdominal pain and blood in stool.  Neurological: Negative for dizziness, syncope, light-headedness and headaches.     Objective:   Vitals:   11/14/19 1654  BP: (!) 164/83  Pulse: 72  Resp: 12  Temp: 98.6 F (37 C)  SpO2: 99%  Weight: 190 lb (86.2 kg)  Height: 5' 4" (1.626 m)     Physical Exam Vitals reviewed.  Constitutional:      Appearance: She is well-developed.  HENT:     Head: Normocephalic and atraumatic.  Eyes:     Conjunctiva/sclera: Conjunctivae normal.     Pupils: Pupils are equal, round, and reactive to light.  Neck:     Vascular: No carotid bruit.  Cardiovascular:     Rate and Rhythm: Normal rate and regular rhythm.     Heart sounds: Normal heart sounds.  Pulmonary:     Effort: Pulmonary effort is normal.     Breath sounds: Normal breath sounds.  Abdominal:     Palpations: Abdomen is soft. There is no pulsatile mass.     Tenderness: There is no abdominal tenderness.     Comments: Umbilicus appears normal at this time, no apparent discharge or induration, no erythema.  Skin:    General: Skin is warm and dry.  Neurological:     Mental Status: She is alert and oriented to person, place, and time.  Psychiatric:        Behavior: Behavior normal.        Assessment & Plan:  Susan Miles is a 73 y.o. female .  Cellulitis, umbilical - Plan: doxycycline (VIBRA-TABS) 100 MG tablet  -History of intermittent cellulitis of umbilicus, reports symptoms yesterday, not currently seen.  Printed prescription for doxycycline if recurrence of exudate, erythema, with RTC precautions if not improving within a few days of use.  Essential hypertension - Plan: metoprolol (TOPROL-XL) 200  MG 24 hr tablet, losartan (COZAAR) 100 MG tablet, cloNIDine (CATAPRES) 0.2 MG tablet, amLODipine (NORVASC)  10 MG tablet, Comprehensive metabolic panel  -Uncontrolled, potentially may be in part due to new use of tamoxifen  -Increase Catapres to 0.4 mg twice daily, continue other meds same dose as above, follow-up with nephrology as planned to discuss changes.  RTC precautions if new side effects . Mixed hyperlipidemia - Plan: atorvastatin (LIPITOR) 20 MG tablet, Comprehensive metabolic panel, Lipid panel  -Tolerating Lipitor, continue same.  Labs pending  Type 2 diabetes mellitus with microalbuminuria, without long-term current use of insulin (HCC) - Plan: metFORMIN (GLUCOPHAGE) 1000 MG tablet, dapagliflozin propanediol (FARXIGA) 10 MG TABS tablet, glipiZIDE (GLUCOTROL) 10 MG tablet, Hemoglobin A1c  -Variable readings above no recent true hypoglycemia.  Hypoglycemic precautions discussed, check A1c.  No changes for now.    Meds ordered this encounter  Medications  . metoprolol (TOPROL-XL) 200 MG 24 hr tablet    Sig: TAKE 1 TABLET (200 MG TOTAL) BY MOUTH DAILY.    Dispense:  90 tablet    Refill:  1  . losartan (COZAAR) 100 MG tablet    Sig: Take 1 tablet (100 mg total) by mouth daily.    Dispense:  90 tablet    Refill:  1  . cloNIDine (CATAPRES) 0.2 MG tablet    Sig: Take 2 tablets (0.4 mg total) by mouth 2 (two) times daily. TAKE 1 TABLET (0.2 MG TOTAL) BY MOUTH 2 TIMES DAILY    Dispense:  360 tablet    Refill:  1  . amLODipine (NORVASC) 10 MG tablet    Sig: Take 1 tablet (10 mg total) by mouth daily.    Dispense:  90 tablet    Refill:  1  . atorvastatin (LIPITOR) 20 MG tablet    Sig: Take 1 tablet (20 mg total) by mouth daily at 6 PM.    Dispense:  90 tablet    Refill:  1  . metFORMIN (GLUCOPHAGE) 1000 MG tablet    Sig: Take 1 tablet (1,000 mg total) by mouth 2 (two) times daily with a meal.    Dispense:  180 tablet    Refill:  1  . dapagliflozin propanediol (FARXIGA) 10 MG TABS tablet    Sig: Take 10 mg by mouth daily.    Dispense:  90 tablet    Refill:  1  . glipiZIDE  (GLUCOTROL) 10 MG tablet    Sig: TAKE 2 TABLETS (20 MG TOTAL) BY MOUTH 2 (TWO) TIMES DAILY BEFORE A MEAL.    Dispense:  360 tablet    Refill:  1  . doxycycline (VIBRA-TABS) 100 MG tablet    Sig: Take 1 tablet (100 mg total) by mouth 2 (two) times daily.    Dispense:  20 tablet    Refill:  0   Patient Instructions  Try higher dose of 0.56m clonidine twice per day (2 of the 0.250m until follow up with Dr. BhCarolin SicksNo other changes for now. recheck in 3 months.   Umbilical area appears okay right now, but if any increased drainage, soreness or redness of the area start antibiotic right away.  If that does not improve your symptoms within a few days then recommend in office evaluation or emergency room  Return to the clinic or go to the nearest emergency room if any of your symptoms worsen or new symptoms occur.   Cellulitis, Adult  Cellulitis is a skin infection. The infected area is usually warm, red, swollen, and  tender. This condition occurs most often in the arms and lower legs. The infection can travel to the muscles, blood, and underlying tissue and become serious. It is very important to get treated for this condition. What are the causes? Cellulitis is caused by bacteria. The bacteria enter through a break in the skin, such as a cut, burn, insect bite, open sore, or crack. What increases the risk? This condition is more likely to occur in people who:  Have a weak body defense system (immune system).  Have open wounds on the skin, such as cuts, burns, bites, and scrapes. Bacteria can enter the body through these open wounds.  Are older than 73 years of age.  Have diabetes.  Have a type of long-lasting (chronic) liver disease (cirrhosis) or kidney disease.  Are obese.  Have a skin condition such as: ? Itchy rash (eczema). ? Slow movement of blood in the veins (venous stasis). ? Fluid buildup below the skin (edema).  Have had radiation therapy.  Use IV drugs. What are  the signs or symptoms? Symptoms of this condition include:  Redness, streaking, or spotting on the skin.  Swollen area of the skin.  Tenderness or pain when an area of the skin is touched.  Warm skin.  A fever.  Chills.  Blisters. How is this diagnosed? This condition is diagnosed based on a medical history and physical exam. You may also have tests, including:  Blood tests.  Imaging tests. How is this treated? Treatment for this condition may include:  Medicines, such as antibiotic medicines or medicines to treat allergies (antihistamines).  Supportive care, such as rest and application of cold or warm cloths (compresses) to the skin.  Hospital care, if the condition is severe. The infection usually starts to get better within 1-2 days of treatment. Follow these instructions at home:  Medicines  Take over-the-counter and prescription medicines only as told by your health care provider.  If you were prescribed an antibiotic medicine, take it as told by your health care provider. Do not stop taking the antibiotic even if you start to feel better. General instructions  Drink enough fluid to keep your urine pale yellow.  Do not touch or rub the infected area.  Raise (elevate) the infected area above the level of your heart while you are sitting or lying down.  Apply warm or cold compresses to the affected area as told by your health care provider.  Keep all follow-up visits as told by your health care provider. This is important. These visits let your health care provider make sure a more serious infection is not developing. Contact a health care provider if:  You have a fever.  Your symptoms do not begin to improve within 1-2 days of starting treatment.  Your bone or joint underneath the infected area becomes painful after the skin has healed.  Your infection returns in the same area or another area.  You notice a swollen bump in the infected area.  You  develop new symptoms.  You have a general ill feeling (malaise) with muscle aches and pains. Get help right away if:  Your symptoms get worse.  You feel very sleepy.  You develop vomiting or diarrhea that persists.  You notice red streaks coming from the infected area.  Your red area gets larger or turns dark in color. These symptoms may represent a serious problem that is an emergency. Do not wait to see if the symptoms will go away. Get medical help right  away. Call your local emergency services (911 in the [place] Do not drive yourself to the hospital. Summary  Cellulitis is a skin infection. This condition occurs most often in the arms and lower legs.  Treatment for this condition may include medicines, such as antibiotic medicines or antihistamines.  Take over-the-counter and prescription medicines only as told by your health care provider. If you were prescribed an antibiotic medicine, do not stop taking the antibiotic even if you start to feel better.  Contact a health care provider if your symptoms do not begin to improve within 1-2 days of starting treatment or your symptoms get worse.  Keep all follow-up visits as told by your health care provider. This is important. These visits let your health care provider make sure that a more serious infection is not developing. This information is not intended to replace advice given to you by your health care provider. Make sure you discuss any questions you have with your health care provider. Document Released: 08/31/2005 Document Revised: 04/12/2018 Document Reviewed: 04/12/2018 Elsevier Patient Education  2020 [employer].      Signed, [name], MD [facility]

## 2019-11-15 LAB — HEMOGLOBIN A1C
Est. average glucose Bld gHb Est-mCnc: 163 mg/dL
Hgb A1c MFr Bld: 7.3 % — ABNORMAL HIGH (ref 4.8–5.6)

## 2019-11-15 LAB — COMPREHENSIVE METABOLIC PANEL
ALT: 10 IU/L (ref 0–32)
AST: 14 IU/L (ref 0–40)
Albumin/Globulin Ratio: 1.6 (ref 1.2–2.2)
Albumin: 4.4 g/dL (ref 3.7–4.7)
Alkaline Phosphatase: 95 IU/L (ref 39–117)
BUN/Creatinine Ratio: 16 (ref 12–28)
BUN: 16 mg/dL (ref 8–27)
Bilirubin Total: 0.5 mg/dL (ref 0.0–1.2)
CO2: 18 mmol/L — ABNORMAL LOW (ref 20–29)
Calcium: 10.8 mg/dL — ABNORMAL HIGH (ref 8.7–10.3)
Chloride: 104 mmol/L (ref 96–106)
Creatinine, Ser: 1 mg/dL (ref 0.57–1.00)
GFR calc Af Amer: 65 mL/min/{1.73_m2} (ref >59)
GFR calc non Af Amer: 56 mL/min/{1.73_m2} — ABNORMAL LOW (ref >59)
Globulin, Total: 2.7 g/dL (ref 1.5–4.5)
Glucose: 83 mg/dL (ref 65–99)
Potassium: 3.9 mmol/L (ref 3.5–5.2)
Sodium: 139 mmol/L (ref 134–144)
Total Protein: 7.1 g/dL (ref 6.0–8.5)

## 2019-11-15 LAB — LIPID PANEL
Chol/HDL Ratio: 3.1 ratio (ref 0.0–4.4)
Cholesterol, Total: 136 mg/dL (ref 100–199)
HDL: 44 mg/dL (ref >39)
LDL Chol Calc (NIH): 75 mg/dL (ref 0–99)
Triglycerides: 86 mg/dL (ref 0–149)
VLDL Cholesterol Cal: 17 mg/dL (ref 5–40)

## 2019-11-20 ENCOUNTER — Encounter: Payer: Self-pay | Admitting: Radiology

## 2019-11-26 ENCOUNTER — Other Ambulatory Visit: Payer: Self-pay | Admitting: Family Medicine

## 2019-11-26 DIAGNOSIS — I10 Essential (primary) hypertension: Secondary | ICD-10-CM

## 2019-11-26 NOTE — Telephone Encounter (Signed)
Requested medication (s) are due for refill today: yes  Requested medication (s) are on the active medication list: yes  Last refill:  08/02/2019  Future visit scheduled: yes  Notes to clinic:  review for refill Looks like patient is on 0.1 and 0.2   Requested Prescriptions  Pending Prescriptions Disp Refills   cloNIDine (CATAPRES) 0.1 MG tablet [Pharmacy Med Name: CLONIDINE HCL 0.1 MG TABLET] 180 tablet 1    Sig: Take 1 tablet (0.1 mg total) by mouth 2 (two) times daily.      Cardiovascular:  Alpha-2 Agonists Failed - 11/26/2019  1:38 AM      Failed - Last BP in normal range    BP Readings from Last 1 Encounters:  11/14/19 (!) 164/83          Passed - Last Heart Rate in normal range    Pulse Readings from Last 1 Encounters:  11/14/19 72          Passed - Valid encounter within last 6 months    Recent Outpatient Visits           1 week ago Cellulitis, umbilical   [facility], Ranell Patrick, MD   6 months ago Type 2 diabetes mellitus with microalbuminuria, without long-term current use of insulin ([place])   [facility] at [facility], [place], MD   6 months ago Type 2 diabetes mellitus with microalbuminuria, without long-term current use of insulin [facility])   [facility], Ranell Patrick, MD   9 months ago Type 2 diabetes mellitus with microalbuminuria, without long-term current use of insulin [facility])   [facility], Ranell Patrick, MD   1 year ago Type 2 diabetes mellitus with microalbuminuria, without long-term current use of insulin [facility])   [facility], Ranell Patrick, MD       Future Appointments             In 2 months [name], MD [facility] [place], [facility]

## 2019-12-10 ENCOUNTER — Telehealth: Payer: Self-pay | Admitting: Family Medicine

## 2019-12-10 DIAGNOSIS — E1129 Type 2 diabetes mellitus with other diabetic kidney complication: Secondary | ICD-10-CM

## 2019-12-10 DIAGNOSIS — R809 Proteinuria, unspecified: Secondary | ICD-10-CM

## 2019-12-10 NOTE — Telephone Encounter (Signed)
Pt states she was approved got into the program for [employer] for her  dapagliflozin propanediol (FARXIGA) 10 MG TABS tablet  But Dr Carlota Raspberry needs to send in Rx. Pt states the program sent the dr a fax, but she was provided one as well. Fax:  [phone]  Phone number  [phone]  Pt will be out of medication in a couple of days.  Can you let pt know if you received the fax?

## 2019-12-16 ENCOUNTER — Other Ambulatory Visit: Payer: Self-pay | Admitting: Emergency Medicine

## 2019-12-16 MED ORDER — FARXIGA 10 MG PO TABS: 10.0000 mg | ORAL_TABLET | Freq: Every day | ORAL | 1 refills | Status: DC

## 2019-12-16 NOTE — Telephone Encounter (Signed)
Done. At nurse desk.

## 2019-12-16 NOTE — Telephone Encounter (Signed)
Dr Carlota Raspberry I do not see this paperwork anywhere but can you reprint this rx and I can fax to the number provided

## 2019-12-24 ENCOUNTER — Telehealth: Payer: Self-pay | Admitting: *Deleted

## 2019-12-24 NOTE — Telephone Encounter (Signed)
Schedule AWV.  

## 2020-01-03 DIAGNOSIS — H52223 Regular astigmatism, bilateral: Secondary | ICD-10-CM | POA: Diagnosis not present

## 2020-01-03 DIAGNOSIS — E119 Type 2 diabetes mellitus without complications: Secondary | ICD-10-CM | POA: Diagnosis not present

## 2020-01-03 DIAGNOSIS — H5213 Myopia, bilateral: Secondary | ICD-10-CM | POA: Diagnosis not present

## 2020-01-03 DIAGNOSIS — H25099 Other age-related incipient cataract, unspecified eye: Secondary | ICD-10-CM | POA: Diagnosis not present

## 2020-01-03 DIAGNOSIS — Z7984 Long term (current) use of oral hypoglycemic drugs: Secondary | ICD-10-CM | POA: Diagnosis not present

## 2020-01-03 DIAGNOSIS — I10 Essential (primary) hypertension: Secondary | ICD-10-CM | POA: Diagnosis not present

## 2020-01-03 DIAGNOSIS — H524 Presbyopia: Secondary | ICD-10-CM | POA: Diagnosis not present

## 2020-01-03 LAB — HM DIABETES EYE EXAM

## 2020-01-06 ENCOUNTER — Telehealth: Payer: Self-pay | Admitting: Family Medicine

## 2020-01-06 DIAGNOSIS — E1129 Type 2 diabetes mellitus with other diabetic kidney complication: Secondary | ICD-10-CM

## 2020-01-06 DIAGNOSIS — R809 Proteinuria, unspecified: Secondary | ICD-10-CM

## 2020-01-06 NOTE — Telephone Encounter (Signed)
Please see telephone call for 12/10/2019   Pt needs papercopy if possible for medication , since she has not received medication and she has received notification that they never received it   Please advise

## 2020-01-07 ENCOUNTER — Other Ambulatory Visit: Payer: Self-pay | Admitting: Emergency Medicine

## 2020-01-07 ENCOUNTER — Other Ambulatory Visit: Payer: Self-pay

## 2020-01-07 ENCOUNTER — Inpatient Hospital Stay: Payer: Medicare HMO | Attending: Adult Health | Admitting: Adult Health

## 2020-01-07 ENCOUNTER — Encounter: Payer: Self-pay | Admitting: Adult Health

## 2020-01-07 VITALS — BP 118/56 | HR 75 | Temp 98.2°F | Resp 18 | Ht 64.0 in | Wt 187.1 lb

## 2020-01-07 DIAGNOSIS — Z90710 Acquired absence of both cervix and uterus: Secondary | ICD-10-CM | POA: Insufficient documentation

## 2020-01-07 DIAGNOSIS — D0511 Intraductal carcinoma in situ of right breast: Secondary | ICD-10-CM | POA: Diagnosis not present

## 2020-01-07 DIAGNOSIS — Z803 Family history of malignant neoplasm of breast: Secondary | ICD-10-CM | POA: Insufficient documentation

## 2020-01-07 DIAGNOSIS — R809 Proteinuria, unspecified: Secondary | ICD-10-CM

## 2020-01-07 DIAGNOSIS — Z8249 Family history of ischemic heart disease and other diseases of the circulatory system: Secondary | ICD-10-CM | POA: Insufficient documentation

## 2020-01-07 DIAGNOSIS — I10 Essential (primary) hypertension: Secondary | ICD-10-CM | POA: Diagnosis not present

## 2020-01-07 DIAGNOSIS — Z923 Personal history of irradiation: Secondary | ICD-10-CM | POA: Insufficient documentation

## 2020-01-07 DIAGNOSIS — Z7984 Long term (current) use of oral hypoglycemic drugs: Secondary | ICD-10-CM | POA: Insufficient documentation

## 2020-01-07 DIAGNOSIS — Z79810 Long term (current) use of selective estrogen receptor modulators (SERMs): Secondary | ICD-10-CM | POA: Diagnosis not present

## 2020-01-07 DIAGNOSIS — Z79899 Other long term (current) drug therapy: Secondary | ICD-10-CM | POA: Insufficient documentation

## 2020-01-07 DIAGNOSIS — Z170 Estrogen receptor positive status [ER+]: Secondary | ICD-10-CM | POA: Diagnosis not present

## 2020-01-07 DIAGNOSIS — E119 Type 2 diabetes mellitus without complications: Secondary | ICD-10-CM | POA: Insufficient documentation

## 2020-01-07 DIAGNOSIS — Z7982 Long term (current) use of aspirin: Secondary | ICD-10-CM | POA: Insufficient documentation

## 2020-01-07 DIAGNOSIS — E1129 Type 2 diabetes mellitus with other diabetic kidney complication: Secondary | ICD-10-CM

## 2020-01-07 NOTE — Telephone Encounter (Signed)
Patient is wanting to come by our office and pick up paper copy of prescription . The medication is farxiga. Needs soon. Please reach out to patient . Patient will be dropped by [employer] program . She will be dropped soon.

## 2020-01-07 NOTE — Telephone Encounter (Signed)
See below msg 

## 2020-01-07 NOTE — Telephone Encounter (Signed)
Patient informed Dr Carlota Raspberry is not in the office today. Message will be sent and when rx is printed and ready will give her a call

## 2020-01-07 NOTE — Progress Notes (Signed)
SURVIVORSHIP VISIT:    BRIEF ONCOLOGIC HISTORY:  Oncology History  Ductal carcinoma in situ (DCIS) of right breast  07/04/2019 Initial Diagnosis   Routine screening mammogram detected 1.6cm mass in the right breast at the 9 o'clock position, loosely group calcifications in the upper right breast spanning 3.6cm, and no axillary adenopathy. Biopsy showed intermediate grade DCIS, ER 100%, PR 100%.    07/10/2019 Cancer Staging   Staging form: Breast, AJCC 8th Edition - Clinical stage from 07/10/2019: Stage 0 (cTis (DCIS), cN0, cM0, ER+, PR+, HER2: Not Assessed) - Signed by Nicholas Lose, MD on 07/10/2019   08/09/2019 Surgery   Right lumpectomy Lucia Gaskins) [phone]): multifocal low grade DCIS spanning 1.4cm and 0.9cm, clear margins.    09/09/2019 - 10/04/2019 Radiation Therapy   The patient initially received a dose of 42.56 Gy in 16 fractions to the breast using whole-breast tangent fields. This was delivered using a 3-D conformal technique. The patient then received a boost to the seroma. This delivered an additional 8 Gy in 59factions using a 3 field photon technique due to the depth of the seroma. The total dose was 50.56 Gy.   10/2019 - 10/2024 Anti-estrogen oral therapy   Tamoxifen daily     INTERVAL HISTORY:  Ms. MEddlemanto review her survivorship care plan detailing her treatment course for breast cancer, as well as monitoring long-term side effects of that treatment, education regarding health maintenance, screening, and overall wellness and health promotion.     Overall, Ms. MSanbornreports feeling quite well.  She is taking Tamoxifen daily.  She has no hot flashes or arthralgias, but notes a manageable vaginal discharge.    REVIEW OF SYSTEMS:  Review of Systems  Constitutional: Negative for appetite change, chills, fatigue, fever and unexpected weight change.  HENT:   Negative for hearing loss, lump/mass, sore throat and trouble swallowing.   Eyes: Negative for eye problems and icterus.   Respiratory: Negative for chest tightness, cough and shortness of breath.   Cardiovascular: Negative for chest pain, leg swelling and palpitations.  Gastrointestinal: Negative for abdominal distention, abdominal pain, blood in stool, constipation, diarrhea, nausea, rectal pain and vomiting.  Endocrine: Negative for hot flashes.  Genitourinary: Negative for difficulty urinating.   Musculoskeletal: Negative for arthralgias.  Skin: Negative for itching and rash.  Neurological: Negative for dizziness, extremity weakness, headaches and numbness.  Hematological: Negative for adenopathy. Does not bruise/bleed easily.  Psychiatric/Behavioral: Negative for depression. The patient is not nervous/anxious.    Breast: Denies any new nodularity, masses, tenderness, nipple changes, or nipple discharge.      ONCOLOGY TREATMENT TEAM:  1. Surgeon:  [facility]. [facility]ia [facility] 2. Medical Oncologist: Dr. GLindi Adie 3. Radiation Oncologist: Dr. MLisbeth Renshaw   PAST MEDICAL/SURGICAL HISTORY:  Past Medical History:  Diagnosis Date  . Cancer ([facility]    right breast cancer  . [facility]   . Family history of breast cancer   . Hypertension   . Sleep apnea    does not use CPAP   Past Surgical History:  Procedure Laterality Date  . ABDOMINAL HYSTERECTOMY    . BREAST LUMPECTOMY WITH RADIOACTIVE SEED LOCALIZATION Right 08/09/2019   Procedure: RIGHT BREAST LUMPECTOMY X 2 WITH RADIOACTIVE SEED LOCALIZATION (3 SEEDS);  Surgeon: NAlphonsa Overall MD;  [place] [facility]  Service: General;  Laterality: Right;     ALLERGIES:  Allergies  Allergen Reactions  . Ace Inhibitors Cough     CURRENT MEDICATIONS:  Outpatient Encounter  Medications as of 01/07/2020  Medication Sig  . ACCU-CHEK AVIVA PLUS test strip USE TO TEST BLOOD SUGAR ONCE DAILY. DX E11.9  . acetaminophen (TYLENOL) 500 MG tablet Take 500 mg by mouth every 6 (six) hours as needed for mild pain.   Marland Kitchen amLODipine (NORVASC) 10 MG tablet Take 1 tablet (10 mg total) by mouth daily.  Marland Kitchen aspirin 81 MG tablet Take 81 mg by mouth daily.  Marland Kitchen atorvastatin (LIPITOR) 20 MG tablet Take 1 tablet (20 mg total) by mouth daily at 6 PM.  . Blood Glucose Monitoring Suppl (LDR BLOOD GLUCOSE TRUETEST) w/Device KIT 1 Device by Does not apply route daily. Test blood sugar once daily. Dx: E11.9  . cetirizine (ZYRTEC) 10 MG tablet Take 1 tablet (10 mg total) by mouth daily.  . cloNIDine (CATAPRES) 0.2 MG tablet Take 2 tablets (0.4 mg total) by mouth 2 (two) times daily. TAKE 1 TABLET (0.2 MG TOTAL) BY MOUTH 2 TIMES DAILY  . dapagliflozin propanediol (FARXIGA) 10 MG TABS tablet Take 10 mg by mouth daily.  Marland Kitchen doxycycline (VIBRA-TABS) 100 MG tablet Take 1 tablet (100 mg total) by mouth 2 (two) times daily.  . fluticasone (FLONASE) 50 MCG/ACT nasal spray Place 1-2 sprays into both nostrils daily.  Marland Kitchen glipiZIDE (GLUCOTROL) 10 MG tablet TAKE 2 TABLETS (20 MG TOTAL) BY MOUTH 2 (TWO) TIMES DAILY BEFORE A MEAL.  Marland Kitchen HYDROcodone-acetaminophen (NORCO/VICODIN) 5-325 MG tablet Take 1 tablet by mouth every 6 (six) hours as needed for moderate pain.  . Lancets MISC Test blood sugar once daily. Dx: E11.9  . losartan (COZAAR) 100 MG tablet Take 1 tablet (100 mg total) by mouth daily.  . metFORMIN (GLUCOPHAGE) 1000 MG tablet Take 1 tablet (1,000 mg total) by mouth 2 (two) times daily with a meal.  . metoprolol (TOPROL-XL) 200 MG 24 hr tablet TAKE 1 TABLET (200 MG TOTAL) BY MOUTH DAILY.  . tamoxifen (NOLVADEX) 20 MG tablet Take 1 tablet (20 mg total) by mouth daily.  . [DISCONTINUED] cloNIDine (CATAPRES) 0.1 MG tablet Take 1 tablet (0.1 mg total) by mouth 2 (two) times daily. (Patient not taking: Reported on 01/07/2020)   No facility-administered encounter medications on file as of 01/07/2020.     ONCOLOGIC FAMILY HISTORY:  Family History  Problem Relation Age of Onset  . Hypertension Mother   . Heart disease Father   . Breast cancer  Sister        diagnosed late 19s  . Breast cancer Sister        diagnosed 35s     GENETIC COUNSELING/TESTING: Not at this time  SOCIAL HISTORY:  Social History   Socioeconomic History  . Marital status: Married    Spouse name: Not on file  . Number of children: Not on file  . Years of education: Not on file  . Highest education level: Not on file  Occupational History  . Not on file  Tobacco Use  . Smoking status: Never Smoker  . Smokeless tobacco: Never Used  Substance and Sexual Activity  . Alcohol use: No  . Drug use: No  . Sexual activity: Not on file  Other Topics Concern  . Not on file  Social History Narrative  . Not on file   Social Determinants of Health   Financial Resource Strain:   . Difficulty of Paying Living Expenses: Not on file  Food Insecurity:   . Worried About Charity fundraiser in the Last Year: Not on file  . Ran Out of  Food in the Last Year: Not on file  Transportation Needs:   . Lack of Transportation (Medical): Not on file  . Lack of Transportation (Non-Medical): Not on file  Physical Activity:   . Days of Exercise per Week: Not on file  . Minutes of Exercise per Session: Not on file  Stress:   . Feeling of Stress : Not on file  Social Connections:   . Frequency of Communication with Friends and Family: Not on file  . Frequency of Social Gatherings with Friends and Family: Not on file  . Attends Religious Services: Not on file  . Active Member of Clubs or Organizations: Not on file  . Attends Archivist Meetings: Not on file  . Marital Status: Not on file  Intimate Partner Violence:   . Fear of Current or Ex-Partner: Not on file  . Emotionally Abused: Not on file  . Physically Abused: Not on file  . Sexually Abused: Not on file     OBSERVATIONS/OBJECTIVE:  BP (!) 118/56 (BP Location: Left Arm, Patient Position: Sitting)   Pulse 75   Temp 98.2 F (36.8 C) (Temporal)   Resp 18   Ht '5\' 4"'  (1.626 m)   Wt 187 lb 1.6  oz (84.9 kg)   SpO2 100%   BMI 32.12 kg/m  GENERAL: Patient is a well appearing female in no acute distress HEENT:  Sclerae anicteric.  Oropharynx clear and moist. No ulcerations or evidence of oropharyngeal candidiasis. Neck is supple.  NODES:  No cervical, supraclavicular, or axillary lymphadenopathy palpated.  BREAST EXAM:  Right breast s/p lumpectomy, no sign of local recurrence, left breast benign.  LUNGS:  Clear to auscultation bilaterally.  No wheezes or rhonchi. HEART:  Regular rate and rhythm. No murmur appreciated. ABDOMEN:  Soft, nontender.  Positive, normoactive bowel sounds. No organomegaly palpated. MSK:  No focal spinal tenderness to palpation. Full range of motion bilaterally in the upper extremities. EXTREMITIES:  No peripheral edema.   SKIN:  Clear with no obvious rashes or skin changes. No nail dyscrasia. NEURO:  Nonfocal. Well oriented.  Appropriate affect.   LABORATORY DATA:  None for this visit.  DIAGNOSTIC IMAGING:  None for this visit.      ASSESSMENT AND PLAN:  Ms.. Wile is a pleasant 73 y.o. female with Stage 0 right breast DCIS, ER+/PR+, diagnosed in 06/2019, treated with lumpectomy, adjuvant radiation therapy, and anti-estrogen therapy with Tamoxifen beginning in 10/2019.  She presents to the Survivorship Clinic for our initial meeting and routine follow-up post-completion of treatment for breast cancer.    1. Stage 0 right breast cancer:  Ms. Strawder is continuing to recover from definitive treatment for breast cancer. She will follow-up with her medical oncologist, Dr. Lindi Adie in 06/2020 with history and physical exam per surveillance protocol.  She will continue her anti-estrogen therapy with Tamoxifen. Thus far, she is tolerating the Tamoxifen well, with minimal side effects. She was instructed to make Dr. Lindi Adie or myself aware if she begins to experience any worsening side effects of the medication and I could see her back in clinic to help manage those  side effects, as needed. Her mammogram is due 06/2020; orders placed today.  Her breast density is category B. Today, a comprehensive survivorship care plan and treatment summary was reviewed with the patient today detailing her breast cancer diagnosis, treatment course, potential late/long-term effects of treatment, appropriate follow-up care with recommendations for the future, and patient education resources.  A copy of this summary, along  with a letter will be sent to the patient's primary care provider via mail/fax/In Basket message after today's visit.    2. Bone health:  Given Ms. Graig's age/history of breast cancer, she is at risk for bone demineralization.   She was given education on specific activities to promote bone health.  3. Cancer screening:  Due to Ms. Stawicki's history and her age, she should receive screening for skin cancers, colon cancer, and gynecologic cancers.  The information and recommendations are listed on the patient's comprehensive care plan/treatment summary and were reviewed in detail with the patient.    4. Health maintenance and wellness promotion: Ms. Solum was encouraged to consume 5-7 servings of fruits and vegetables per day. We reviewed the "Nutrition Rainbow" handout, as well as the handout "Take Control of Your Health and Reduce Your Cancer Risk" from the [place].  She was also encouraged to engage in moderate to vigorous exercise for 30 minutes per day most days of the week. We discussed the [employer] fitness program, which is designed for cancer survivors to help them become more physically fit after cancer treatments.  She was instructed to limit her alcohol consumption and continue to abstain from tobacco use.     5. Support services/counseling: It is not uncommon for this period of the patient's cancer care trajectory to be one of many emotions and stressors.  We discussed how this can be increasingly difficult during the times of  quarantine and social distancing due to the COVID-19 pandemic.   She was given information regarding our available services and encouraged to contact me with any questions or for help enrolling in any of our support group/programs.    Follow up instructions:    -Return to cancer center in 6 months for f/u with Dr. Lindi Adie  -Mammogram due in 06/2020 -Follow up with surgery in one year -She is welcome to return back to the Survivorship Clinic at any time; no additional follow-up needed at this time.  -Consider referral back to survivorship as a long-term survivor for continued surveillance  The patient was provided an opportunity to ask questions and all were answered. The patient agreed with the plan and demonstrated an understanding of the instructions.   Total time this encounter:  30 minutes  Scot Dock, NP

## 2020-01-08 ENCOUNTER — Other Ambulatory Visit: Payer: Self-pay | Admitting: Emergency Medicine

## 2020-01-08 ENCOUNTER — Telehealth: Payer: Self-pay | Admitting: Hematology and Oncology

## 2020-01-08 MED ORDER — FARXIGA 10 MG PO TABS: 10.0000 mg | ORAL_TABLET | Freq: Every day | ORAL | 1 refills | Status: DC

## 2020-01-08 NOTE — Telephone Encounter (Signed)
Patient was informed rx is ready for pick up

## 2020-01-08 NOTE — Telephone Encounter (Signed)
I left a message regarding schedule  

## 2020-01-08 NOTE — Telephone Encounter (Signed)
Printed, signed, ready for pickup.

## 2020-01-10 ENCOUNTER — Encounter: Payer: Self-pay | Admitting: Family Medicine

## 2020-01-21 ENCOUNTER — Other Ambulatory Visit: Payer: Self-pay | Admitting: Family Medicine

## 2020-01-31 ENCOUNTER — Encounter: Payer: Self-pay | Admitting: Family Medicine

## 2020-02-11 DIAGNOSIS — N182 Chronic kidney disease, stage 2 (mild): Secondary | ICD-10-CM | POA: Diagnosis not present

## 2020-02-12 ENCOUNTER — Ambulatory Visit (INDEPENDENT_AMBULATORY_CARE_PROVIDER_SITE_OTHER): Payer: Medicare HMO | Admitting: Family Medicine

## 2020-02-12 ENCOUNTER — Other Ambulatory Visit: Payer: Self-pay

## 2020-02-12 ENCOUNTER — Encounter: Payer: Self-pay | Admitting: Family Medicine

## 2020-02-12 VITALS — BP 136/81 | HR 67 | Temp 97.3°F | Ht 64.0 in | Wt 189.0 lb

## 2020-02-12 DIAGNOSIS — N182 Chronic kidney disease, stage 2 (mild): Secondary | ICD-10-CM | POA: Diagnosis not present

## 2020-02-12 DIAGNOSIS — Z8639 Personal history of other endocrine, nutritional and metabolic disease: Secondary | ICD-10-CM | POA: Diagnosis not present

## 2020-02-12 DIAGNOSIS — E1129 Type 2 diabetes mellitus with other diabetic kidney complication: Secondary | ICD-10-CM

## 2020-02-12 DIAGNOSIS — R809 Proteinuria, unspecified: Secondary | ICD-10-CM

## 2020-02-12 DIAGNOSIS — E1122 Type 2 diabetes mellitus with diabetic chronic kidney disease: Secondary | ICD-10-CM | POA: Diagnosis not present

## 2020-02-12 LAB — HEMOGLOBIN A1C
Est. average glucose Bld gHb Est-mCnc: 163 mg/dL
Hgb A1c MFr Bld: 7.3 % — ABNORMAL HIGH (ref 4.8–5.6)

## 2020-02-12 MED ORDER — FARXIGA 10 MG PO TABS: 10.0000 mg | ORAL_TABLET | Freq: Every day | ORAL | 1 refills | Status: DC

## 2020-02-12 NOTE — Progress Notes (Signed)
Subjective:  Patient ID: Susan Miles, female    DOB: [birth year]  Age: 74 y.o. MRN: [id number]  CC:  Chief Complaint  Patient presents with  . Follow-up    on type 2 diabetes. pt clames to have condition well controlled . pt takes medication as directed and checks her BS regieraly. pt checks BS once a day first thing whe she wakes up.    HPI Susan Miles presents for   Diabetes: Complicated by microalbuminuria, CKD stage II, nephrology Dr. Carolin Sicks - appt 3/15. Had bloodwork for that appt yesterday.  Not on insulin. Borderline control in December.  Continued on Farxiga 10 mg daily, glipizide 20 mg twice daily, Metformin 1000 mg twice daily She is on statin, she is on ARB. Microalbumin: Ratio 59 on February 07, 2019 Optho, foot exam, pneumovax: Up-to-date Had both covid vaccines.   Fasting: as low as 69 - one time only, may have skipped meal. Usually 108-115, no postprandials.  No new med side effects.   Out of farxiga today.- on [employer] program, no missed doses of her three meds.  BP doing well on current regimen without new side effects.   Lab Results  Component Value Date   HGBA1C 7.3 (H) 11/14/2019   HGBA1C 7.4 (H) 05/09/2019   HGBA1C 7.7 (A) 02/07/2019   Lab Results  Component Value Date   MICROALBUR 7.5 03/23/2016   [place] 75 11/14/2019   CREATININE 1.00 11/14/2019    History Patient Active Problem List   Diagnosis Date Noted  . Family history of breast cancer   . Ductal carcinoma in situ (DCIS) of right breast 07/04/2019  . Hyperlipidemia 12/21/2017  . Breast asymmetry 05/01/2015  . Cataract, nuclear 05/24/2012  . HTN (hypertension) 02/10/2012  . DM2 (diabetes mellitus, type 2) ([place]) 02/10/2012   Past Medical History:  Diagnosis Date  . Cancer [facility])    right breast cancer  . Diabetes mellitus without complication ([place])   . Family history of breast cancer   . Hypertension   . Sleep apnea    does not use CPAP   Past Surgical History:    Procedure Laterality Date  . ABDOMINAL HYSTERECTOMY    . BREAST LUMPECTOMY WITH RADIOACTIVE SEED LOCALIZATION Right 08/09/2019   Procedure: RIGHT BREAST LUMPECTOMY X 2 WITH RADIOACTIVE SEED LOCALIZATION (3 SEEDS);  Surgeon: Alphonsa Overall, MD;  Location: [facility];  Service: General;  Laterality: Right;   Allergies  Allergen Reactions  . Ace Inhibitors Cough   Prior to Admission medications   Medication Sig Start Date End Date Taking? Authorizing Provider  ACCU-CHEK AVIVA PLUS test strip USE TO TEST BLOOD SUGAR ONCE DAILY. DX E11.9 01/21/20  Yes [name], MD  acetaminophen (TYLENOL) 500 MG tablet Take 500 mg by mouth every 6 (six) hours as needed for mild pain.   Yes [provider]  amLODipine (NORVASC) 10 MG tablet Take 1 tablet (10 mg total) by mouth daily. 11/14/19  Yes [name], MD  aspirin 81 MG tablet Take 81 mg by mouth daily.   Yes [provider]  atorvastatin (LIPITOR) 20 MG tablet Take 1 tablet (20 mg total) by mouth daily at 6 PM. 11/14/19  Yes [name], MD  Blood Glucose Monitoring Suppl (LDR BLOOD GLUCOSE TRUETEST) w/Device KIT 1 Device by Does not apply route daily. Test blood sugar once daily. Dx: E11.9 01/24/18  Yes [name], MD  cetirizine (ZYRTEC) 10 MG tablet Take 1 tablet (10  mg total) by mouth daily. 09/21/15  Yes [name], MD  cloNIDine (CATAPRES) 0.2 MG tablet Take 2 tablets (0.4 mg total) by mouth 2 (two) times daily. TAKE 1 TABLET (0.2 MG TOTAL) BY MOUTH 2 TIMES DAILY 11/14/19  Yes [name], MD  dapagliflozin propanediol (FARXIGA) 10 MG TABS tablet Take 10 mg by mouth daily. 01/08/20  Yes [name], MD  doxycycline (VIBRA-TABS) 100 MG tablet Take 1 tablet (100 mg total) by mouth 2 (two) times daily. 11/14/19  Yes [name], MD  fluticasone (FLONASE) 50 MCG/ACT nasal spray Place 1-2 sprays into both nostrils daily. 04/02/18  Yes [name], MD  glipiZIDE (GLUCOTROL)  10 MG tablet TAKE 2 TABLETS (20 MG TOTAL) BY MOUTH 2 (TWO) TIMES DAILY BEFORE A MEAL. 11/14/19  Yes [name], MD  HYDROcodone-acetaminophen (NORCO/VICODIN) 5-325 MG tablet Take 1 tablet by mouth every 6 (six) hours as needed for moderate pain. 08/09/19  Yes Alphonsa Overall, MD  Lancets MISC Test blood sugar once daily. Dx: E11.9 01/24/18  Yes [name], MD  losartan (COZAAR) 100 MG tablet Take 1 tablet (100 mg total) by mouth daily. 11/14/19  Yes [name], MD  metFORMIN (GLUCOPHAGE) 1000 MG tablet Take 1 tablet (1,000 mg total) by mouth 2 (two) times daily with a meal. 11/14/19  Yes [name], MD  metoprolol (TOPROL-XL) 200 MG 24 hr tablet TAKE 1 TABLET (200 MG TOTAL) BY MOUTH DAILY. 11/14/19  Yes [name], MD  tamoxifen (NOLVADEX) 20 MG tablet Take 1 tablet (20 mg total) by mouth daily. 10/20/19  Yes Nicholas Lose, MD   Social History   Socioeconomic History  . Marital status: Married    Spouse name: Not on file  . Number of children: Not on file  . Years of education: Not on file  . Highest education level: Not on file  Occupational History  . Not on file  Tobacco Use  . Smoking status: Never Smoker  . Smokeless tobacco: Never Used  Substance and Sexual Activity  . Alcohol use: No  . Drug use: No  . Sexual activity: Not on file  Other Topics Concern  . Not on file  Social History Narrative  . Not on file   Social Determinants of Health   Financial Resource Strain:   . Difficulty of Paying Living Expenses: Not on file  Food Insecurity:   . Worried About Charity fundraiser in the Last Year: Not on file  . Ran Out of Food in the Last Year: Not on file  Transportation Needs:   . Lack of Transportation (Medical): Not on file  . Lack of Transportation (Non-Medical): Not on file  Physical Activity:   . Days of Exercise per Week: Not on file  . Minutes of Exercise per Session: Not on file  Stress:   . Feeling of Stress : Not on file    Social Connections:   . Frequency of Communication with Friends and Family: Not on file  . Frequency of Social Gatherings with Friends and Family: Not on file  . Attends Religious Services: Not on file  . Active Member of Clubs or Organizations: Not on file  . Attends Archivist Meetings: Not on file  . Marital Status: Not on file  Intimate Partner Violence:   . Fear of Current or Ex-Partner: Not on file  . Emotionally Abused: Not on file  . Physically Abused: Not on file  .  Sexually Abused: Not on file    Review of Systems  Constitutional: Negative for fatigue and unexpected weight change.  Respiratory: Negative for chest tightness and shortness of breath.   Cardiovascular: Negative for chest pain, palpitations and leg swelling.  Gastrointestinal: Negative for abdominal pain and blood in stool.  Neurological: Negative for dizziness, syncope, light-headedness and headaches.     Objective:   Vitals:   02/12/20 0941  BP: 136/81  Pulse: 67  Temp: (!) 97.3 F (36.3 C)  TempSrc: Temporal  SpO2: 98%  Weight: 189 lb (85.7 kg)  Height: '5\' 4"'  (1.626 m)     Physical Exam Vitals reviewed.  Constitutional:      Appearance: She is well-developed.  HENT:     Head: Normocephalic and atraumatic.  Eyes:     Conjunctiva/sclera: Conjunctivae normal.     Pupils: Pupils are equal, round, and reactive to light.  Neck:     Vascular: No carotid bruit.  Cardiovascular:     Rate and Rhythm: Normal rate and regular rhythm.     Heart sounds: Normal heart sounds.  Pulmonary:     Effort: Pulmonary effort is normal.     Breath sounds: Normal breath sounds.  Abdominal:     Palpations: Abdomen is soft. There is no pulsatile mass.     Tenderness: There is no abdominal tenderness.  Skin:    General: Skin is warm and dry.  Neurological:     Mental Status: She is alert and oriented to person, place, and time.  Psychiatric:        Behavior: Behavior normal.         Assessment & Plan:  Susan Miles is a 74 y.o. female . CKD (chronic kidney disease), stage II  -Has close follow-up with nephrology next week, recent labs obtained per their office.  Continue to monitor blood pressure and diabetes control.  Type 2 diabetes mellitus with microalbuminuria, without long-term current use of insulin (HCC) - Plan: Microalbumin / creatinine urine ratio, Hemoglobin A1c, dapagliflozin propanediol (FARXIGA) 10 MG TABS tablet  -Single episode of relative hypoglycemia, possible risk decreased meal.  Precautions given if any further episodes of hypoglycemia, and counseled about not missing meals.  -Continue same regimen for now, will fax prescription for her [employer] discount program.  Recheck in 3 months.  A1c obtained  History of hypoglycemia  -As above, avoidance precautions given, handout given, RTC/ER precautions given.  If recurs, may need to adjust off sulfonylurea.  No orders of the defined types were placed in this encounter.  Patient Instructions       If you have lab work done today you will be contacted with your lab results within the next 2 weeks.  If you have not heard from [place] then please contact us. The fastest way to get your results is to register for My Chart.   IF you received an x-ray today, you will receive an invoice from [facility]. Please contact [facility] at [phone] with questions or concerns regarding your invoice.   IF you received labwork today, you will receive an invoice from [facility]. Please contact [facility] at [phone] with questions or concerns regarding your invoice.   Our billing staff will not be able to assist you with questions regarding bills from these companies.  You will be contacted with the lab results as soon as they are available. The fastest way to get your results is to activate your My Chart account. Instructions are located on the last page  of this paperwork. If you  have not heard from [place] regarding the results in 2 weeks, please contact this office.         Signed, [name], MD [facility]

## 2020-02-12 NOTE — Patient Instructions (Addendum)
No med changes for now.  Make sure you do not skip any meals, but if any further low blood sugar readings let me know right away as meds may need to be changed See precautions below for hypoglycemia.  Recheck in 3 months. We will send in the prescription for [place].   Return to the clinic or go to the nearest emergency room if any of your symptoms worsen or new symptoms occur.   Hypoglycemia Hypoglycemia occurs when the level of sugar (glucose) in the blood is too low. Hypoglycemia can happen in people who do or do not have diabetes. It can develop quickly, and it can be a medical emergency. For most people with diabetes, a blood glucose level below 70 mg/dL (3.9 mmol/L) is considered hypoglycemia. Glucose is a type of sugar that provides the body's main source of energy. Certain hormones (insulin and glucagon) control the level of glucose in the blood. Insulin lowers blood glucose, and glucagon raises blood glucose. Hypoglycemia can result from having too much insulin in the bloodstream, or from not eating enough food that contains glucose. You may also have reactive hypoglycemia, which happens within 4 hours after eating a meal. What are the causes? Hypoglycemia occurs most often in people who have diabetes and may be caused by:  Diabetes medicine.  Not eating enough, or not eating often enough.  Increased physical activity.  Drinking alcohol on an empty stomach. If you do not have diabetes, hypoglycemia may be caused by:  A tumor in the pancreas.  Not eating enough, or not eating for long periods at a time (fasting).  A severe infection or illness.  Certain medicines. What increases the risk? Hypoglycemia is more likely to develop in:  People who have diabetes and take medicines to lower blood glucose.  People who abuse alcohol.  People who have a severe illness. What are the signs or symptoms? Mild symptoms Mild hypoglycemia may not cause any symptoms. If you do have  symptoms, they may include:  Hunger.  Anxiety.  Sweating and feeling clammy.  Dizziness or feeling light-headed.  Sleepiness.  Nausea.  Increased heart rate.  Headache.  Blurry vision.  Irritability.  Tingling or numbness around the mouth, lips, or tongue.  A change in coordination.  Restless sleep. Moderate symptoms Moderate hypoglycemia can cause:  Mental confusion and poor judgment.  Behavior changes.  Weakness.  Irregular heartbeat. Severe symptoms Severe hypoglycemia is a medical emergency. It can cause:  Fainting.  Seizures.  Loss of consciousness (coma).  Death. How is this diagnosed? Hypoglycemia is diagnosed with a blood test to measure your blood glucose level. This blood test is done while you are having symptoms. Your health care provider may also do a physical exam and review your medical history. How is this treated? This condition can often be treated by immediately eating or drinking something that contains sugar, such as:  Fruit juice, 4-6 oz (120-150 mL).  Regular soda (not diet soda), 4-6 oz (120-150 mL).  Low-fat milk, 4 oz (120 mL).  Several pieces of hard candy.  Sugar or honey, 1 Tbsp (15 mL). Treating hypoglycemia if you have diabetes If you are alert and able to swallow safely, follow the 15:15 rule:  Take 15 grams of a rapid-acting carbohydrate. Talk with your health care provider about how much you should take.  Rapid-acting options include: ? Glucose pills (take 15 grams). ? 6-8 pieces of hard candy. ? 4-6 oz (120-150 mL) of fruit juice. ? 4-6 oz (  120-150 mL) of regular (not diet) soda. ? 1 Tbsp (15 mL) honey or sugar.  Check your blood glucose 15 minutes after you take the carbohydrate.  If the repeat blood glucose level is still at or below 70 mg/dL (3.9 mmol/L), take 15 grams of a carbohydrate again.  If your blood glucose level does not increase above 70 mg/dL (3.9 mmol/L) after 3 tries, seek emergency  medical care.  After your blood glucose level returns to normal, eat a meal or a snack within 1 hour.  Treating severe hypoglycemia Severe hypoglycemia is when your blood glucose level is at or below 54 mg/dL (3 mmol/L). Severe hypoglycemia is a medical emergency. Get medical help right away. If you have severe hypoglycemia and you cannot eat or drink, you may need an injection of glucagon. A family member or close friend should learn how to check your blood glucose and how to give you a glucagon injection. Ask your health care provider if you need to have an emergency glucagon injection kit available. Severe hypoglycemia may need to be treated in a hospital. The treatment may include getting glucose through an IV. You may also need treatment for the cause of your hypoglycemia. Follow these instructions at home:  General instructions  Take over-the-counter and prescription medicines only as told by your health care provider.  Monitor your blood glucose as told by your health care provider.  Limit alcohol intake to no more than 1 drink a day for nonpregnant women and 2 drinks a day for men. One drink equals 12 oz of beer (355 mL), 5 oz of wine (148 mL), or 1 oz of hard liquor (44 mL).  Keep all follow-up visits as told by your health care provider. This is important. If you have diabetes:  Always have a rapid-acting carbohydrate snack with you to treat low blood glucose.  Follow your diabetes management plan as directed. Make sure you: ? Know the symptoms of hypoglycemia. It is important to treat it right away to prevent it from becoming severe. ? Take your medicines as directed. ? Follow your exercise plan. ? Follow your meal plan. Eat on time, and do not skip meals. ? Check your blood glucose as often as directed. Always check before and after exercise. ? Follow your sick day plan whenever you cannot eat or drink normally. Make this plan in advance with your health care  provider.  Share your diabetes management plan with people in your workplace, school, and household.  Check your urine for ketones when you are ill and as told by your health care provider.  Carry a medical alert card or wear medical alert jewelry. Contact a health care provider if:  You have problems keeping your blood glucose in your target range.  You have frequent episodes of hypoglycemia. Get help right away if:  You continue to have hypoglycemia symptoms after eating or drinking something containing glucose.  Your blood glucose is at or below 54 mg/dL (3 mmol/L).  You have a seizure.  You faint. These symptoms may represent a serious problem that is an emergency. Do not wait to see if the symptoms will go away. Get medical help right away. Call your local emergency services (911 in the [place] Summary  Hypoglycemia occurs when the level of sugar (glucose) in the blood is too low.  Hypoglycemia can happen in people who do or do not have diabetes. It can develop quickly, and it can be a medical emergency.  Make sure you  know the symptoms of hypoglycemia and how to treat it.  Always have a rapid-acting carbohydrate snack with you to treat low blood sugar. This information is not intended to replace advice given to you by your health care provider. Make sure you discuss any questions you have with your health care provider. Document Revised: 05/14/2018 Document Reviewed: 12/25/2015 Elsevier Patient Education  [employer].   If you have lab work done today you will be contacted with your lab results within the next 2 weeks.  If you have not heard from [place] then please contact us. The fastest way to get your results is to register for My Chart.   IF you received an x-ray today, you will receive an invoice from [facility]. Please contact [facility] at [phone] with questions or concerns regarding your invoice.   IF you received labwork today, you  will receive an invoice from [facility]. Please contact [facility] at [phone] with questions or concerns regarding your invoice.   Our billing staff will not be able to assist you with questions regarding bills from these companies.  You will be contacted with the lab results as soon as they are available. The fastest way to get your results is to activate your My Chart account. Instructions are located on the last page of this paperwork. If you have not heard from [place] regarding the results in 2 weeks, please contact this office.

## 2020-02-17 DIAGNOSIS — N182 Chronic kidney disease, stage 2 (mild): Secondary | ICD-10-CM | POA: Diagnosis not present

## 2020-02-17 DIAGNOSIS — M109 Gout, unspecified: Secondary | ICD-10-CM | POA: Diagnosis not present

## 2020-02-17 DIAGNOSIS — I129 Hypertensive chronic kidney disease with stage 1 through stage 4 chronic kidney disease, or unspecified chronic kidney disease: Secondary | ICD-10-CM | POA: Diagnosis not present

## 2020-02-17 DIAGNOSIS — R809 Proteinuria, unspecified: Secondary | ICD-10-CM | POA: Diagnosis not present

## 2020-02-18 ENCOUNTER — Other Ambulatory Visit: Payer: Self-pay | Admitting: Nephrology

## 2020-02-18 ENCOUNTER — Other Ambulatory Visit (HOSPITAL_COMMUNITY): Payer: Self-pay | Admitting: Nephrology

## 2020-02-19 DIAGNOSIS — D0591 Unspecified type of carcinoma in situ of right breast: Secondary | ICD-10-CM | POA: Diagnosis not present

## 2020-02-27 ENCOUNTER — Other Ambulatory Visit (HOSPITAL_COMMUNITY): Payer: Self-pay | Admitting: Nephrology

## 2020-02-27 ENCOUNTER — Other Ambulatory Visit: Payer: Self-pay | Admitting: Nephrology

## 2020-03-06 ENCOUNTER — Encounter (HOSPITAL_COMMUNITY)
Admission: RE | Admit: 2020-03-06 | Discharge: 2020-03-06 | Disposition: A | Discharge status: Home / Self Care | Payer: Medicare HMO | Source: Ambulatory Visit | Attending: Nephrology | Admitting: Nephrology

## 2020-03-06 ENCOUNTER — Ambulatory Visit (HOSPITAL_COMMUNITY)
Admission: RE | Admit: 2020-03-06 | Discharge: 2020-03-06 | Disposition: A | Discharge status: Home / Self Care | Payer: Medicare HMO | Source: Ambulatory Visit | Attending: Nephrology | Admitting: Nephrology

## 2020-03-06 ENCOUNTER — Other Ambulatory Visit: Payer: Self-pay

## 2020-03-06 DIAGNOSIS — C50919 Malignant neoplasm of unspecified site of unspecified female breast: Secondary | ICD-10-CM | POA: Diagnosis not present

## 2020-03-06 MED ORDER — TECHNETIUM TC 99M SESTAMIBI - CARDIOLITE
26.3000 | Freq: Once | INTRAVENOUS | Status: AC | PRN
  Administered 2020-03-06: 26.3 via INTRAVENOUS

## 2020-04-09 ENCOUNTER — Telehealth: Payer: Self-pay | Admitting: *Deleted

## 2020-04-09 NOTE — Telephone Encounter (Signed)
Schedule awv  

## 2020-05-08 ENCOUNTER — Other Ambulatory Visit: Payer: Self-pay | Admitting: Family Medicine

## 2020-05-08 DIAGNOSIS — I10 Essential (primary) hypertension: Secondary | ICD-10-CM

## 2020-05-13 ENCOUNTER — Encounter: Payer: Self-pay | Admitting: Family Medicine

## 2020-05-13 ENCOUNTER — Ambulatory Visit (INDEPENDENT_AMBULATORY_CARE_PROVIDER_SITE_OTHER): Payer: Medicare HMO | Admitting: Family Medicine

## 2020-05-13 ENCOUNTER — Other Ambulatory Visit: Payer: Self-pay

## 2020-05-13 VITALS — BP 128/74 | HR 68 | Temp 98.4°F | Ht 64.0 in | Wt 188.0 lb

## 2020-05-13 DIAGNOSIS — R809 Proteinuria, unspecified: Secondary | ICD-10-CM | POA: Diagnosis not present

## 2020-05-13 DIAGNOSIS — N760 Acute vaginitis: Secondary | ICD-10-CM | POA: Diagnosis not present

## 2020-05-13 DIAGNOSIS — E782 Mixed hyperlipidemia: Secondary | ICD-10-CM | POA: Diagnosis not present

## 2020-05-13 DIAGNOSIS — I10 Essential (primary) hypertension: Secondary | ICD-10-CM

## 2020-05-13 DIAGNOSIS — L03316 Cellulitis of umbilicus: Secondary | ICD-10-CM | POA: Diagnosis not present

## 2020-05-13 DIAGNOSIS — E1129 Type 2 diabetes mellitus with other diabetic kidney complication: Secondary | ICD-10-CM | POA: Diagnosis not present

## 2020-05-13 DIAGNOSIS — J309 Allergic rhinitis, unspecified: Secondary | ICD-10-CM

## 2020-05-13 DIAGNOSIS — N898 Other specified noninflammatory disorders of vagina: Secondary | ICD-10-CM | POA: Diagnosis not present

## 2020-05-13 DIAGNOSIS — B9689 Other specified bacterial agents as the cause of diseases classified elsewhere: Secondary | ICD-10-CM | POA: Diagnosis not present

## 2020-05-13 LAB — POCT WET + KOH PREP
Trich by wet prep: ABSENT
Yeast by KOH: ABSENT
Yeast by wet prep: ABSENT

## 2020-05-13 MED ORDER — LDR BLOOD GLUCOSE TRUETEST W/DEVICE KIT: 1.0000 | PACK | Freq: Every day | 0 refills | Status: AC

## 2020-05-13 MED ORDER — CLONIDINE HCL 0.2 MG PO TABS: 0.2000 mg | ORAL_TABLET | Freq: Two times a day (BID) | ORAL | 1 refills | Status: DC

## 2020-05-13 MED ORDER — GLIPIZIDE 10 MG PO TABS: ORAL_TABLET | ORAL | 1 refills | Status: DC

## 2020-05-13 MED ORDER — METOPROLOL SUCCINATE ER 200 MG PO TB24: ORAL_TABLET | ORAL | 1 refills | Status: DC

## 2020-05-13 MED ORDER — METRONIDAZOLE 500 MG PO TABS: ORAL_TABLET | ORAL | 0 refills | Status: DC

## 2020-05-13 MED ORDER — AMLODIPINE BESYLATE 10 MG PO TABS: 10.0000 mg | ORAL_TABLET | Freq: Every day | ORAL | 1 refills | Status: DC

## 2020-05-13 MED ORDER — ATORVASTATIN CALCIUM 20 MG PO TABS: 20.0000 mg | ORAL_TABLET | Freq: Every day | ORAL | 1 refills | Status: DC

## 2020-05-13 MED ORDER — DOXYCYCLINE HYCLATE 100 MG PO TABS: 100.0000 mg | ORAL_TABLET | Freq: Two times a day (BID) | ORAL | 0 refills | Status: DC

## 2020-05-13 MED ORDER — CETIRIZINE HCL 10 MG PO TABS: 10.0000 mg | ORAL_TABLET | Freq: Every day | ORAL | 1 refills | Status: DC

## 2020-05-13 MED ORDER — METFORMIN HCL 1000 MG PO TABS: 1000.0000 mg | ORAL_TABLET | Freq: Two times a day (BID) | ORAL | 1 refills | Status: DC

## 2020-05-13 MED ORDER — DAPAGLIFLOZIN PROPANEDIOL 10 MG PO TABS: 10.0000 mg | ORAL_TABLET | Freq: Every day | ORAL | 1 refills | Status: DC

## 2020-05-13 NOTE — Progress Notes (Signed)
Subjective:  Patient ID: Susan Miles, female    DOB: July 29, 1946  Age: 74 y.o. MRN: [id number]  CC:  Chief Complaint  Patient presents with  . Chronic Kidney Disease    pt reports no urinary symptom nor has the pt had any flank pain or back pain. pt reports taking her medication as prescribed. pt states having some whit discharge because of her new canswer medication  . Diabetes    pr reports no issues with this condition. pt checks her BS 1-2x a week. pt reports she lost her BS metter and hasn't checked it in a few weeks now. pt would like a new one sent in if possible.  . navel discharge    pt would like an antibiotic for som navel dranage and discharge. pt reports the provider is fimialr with the issue.    HPI Jevon Littlepage presents for  Multiple concerns above.   Diabetes: Complicated by CKD, microalbuminuria.  Followed by nephrology, Dr. Carolin Sicks. appt March 3/15 - no changes.  Treated with Farxiga 24m qd, glipizide219mBID, Metformin 100046mid.  She is on ARB and statin. Needs new meter.  Home readings 120, 118. No symptomatic lows. Sleepiness in the morning at times on higher dose clonidine. No near syncope/syncope.  Microalbumin:  Optho, foot exam, pneumovax: up to date.   Lab Results  Component Value Date   HGBA1C 7.5 (H) 05/13/2020   HGBA1C 7.3 (H) 02/12/2020   HGBA1C 7.3 (H) 11/14/2019   Lab Results  Component Value Date   MICROALBUR 7.5 03/23/2016   LDLCALC 63 05/13/2020   CREATININE 1.07 (H) 05/13/2020    vaginal discharge. Started tamoxifen late last year.  White vaginal d/c 3-4 months.  Comes and goes.  No itching/burning.  No treatments.  On farxiga.   Umbilical infection: Similar in past -  occasional drainage with odor past month or two. Resolved after abx in December for awhile (doxycycline).     Hypertension: norvasc, catapres, losartan. On clonidine 0.4mg68mD. Morning dose clonidine makes sleepy.   Home readings:114/70-80.    BP Readings from Last 3 Encounters:  05/13/20 128/74  02/12/20 136/81  01/07/20 (!) 118/56   Lab Results  Component Value Date   CREATININE 1.07 (H) 05/13/2020   Hyperlipidemia: lipitor 20mg58m- no new myalgia/side effects.  Lab Results  Component Value Date   CHOL 123 05/13/2020   HDL 42 05/13/2020   LDLCALC 63 05/13/2020   TRIG 91 05/13/2020   CHOLHDL 2.9 05/13/2020   Lab Results  Component Value Date   ALT 15 05/13/2020   AST 16 05/13/2020   ALKPHOS 62 05/13/2020   BILITOT 0.3 05/13/2020       History Patient Active Problem List   Diagnosis Date Noted  . Family history of breast cancer   . Ductal carcinoma in situ (DCIS) of right breast 07/04/2019  . Hyperlipidemia 12/21/2017  . Breast asymmetry 05/01/2015  . Cataract, nuclear 05/24/2012  . HTN (hypertension) 02/10/2012  . DM2 (diabetes mellitus, type 2) (HCC) [place]08/2013   Past Medical History:  Diagnosis Date  [facility]  . Diabetes mellitus without complication (HCC) [place] Family history of breast cancer   . Hypertension   . Sleep apnea    does not use CPAP   Past Surgical History:  Procedure Laterality Date  . ABDOMINAL HYSTERECTOMY    . BREAST LUMPECTOMY WITH RADIOACTIVE SEED LOCALIZATION Right 08/09/2019   Procedure: RIGHT BREAST  LUMPECTOMY X 2 WITH RADIOACTIVE SEED LOCALIZATION (3 SEEDS);  Surgeon: Alphonsa Overall, MD;  Location: [place] [facility];  Service: General;  Laterality: Right;   Allergies  Allergen Reactions  . Ace Inhibitors Cough   Prior to Admission medications   Medication Sig Start Date End Date Taking? Authorizing Provider  ACCU-CHEK AVIVA PLUS test strip USE TO TEST BLOOD SUGAR ONCE DAILY. DX E11.9 01/21/20  Yes [name], MD  acetaminophen (TYLENOL) 500 MG tablet Take 500 mg by mouth every 6 (six) hours as needed for mild pain.   Yes [provider]  amLODipine (NORVASC) 10 MG tablet Take 1 tablet (10 mg total) by mouth daily.  11/14/19  Yes [name], MD  aspirin 81 MG tablet Take 81 mg by mouth daily.   Yes [provider]  atorvastatin (LIPITOR) 20 MG tablet Take 1 tablet (20 mg total) by mouth daily at 6 PM. 11/14/19  Yes [name], MD  Blood Glucose Monitoring Suppl (LDR BLOOD GLUCOSE TRUETEST) w/Device KIT 1 Device by Does not apply route daily. Test blood sugar once daily. Dx: E11.9 01/24/18  Yes [name], MD  cetirizine (ZYRTEC) 10 MG tablet Take 1 tablet (10 mg total) by mouth daily. 09/21/15  Yes [name], MD  cloNIDine (CATAPRES) 0.2 MG tablet Take 2 tablets (0.4 mg total) by mouth 2 (two) times daily. TAKE 1 TABLET (0.2 MG TOTAL) BY MOUTH 2 TIMES DAILY 11/14/19  Yes [name], MD  dapagliflozin propanediol (FARXIGA) 10 MG TABS tablet Take 10 mg by mouth daily. 02/12/20  Yes [name], MD  doxycycline (VIBRA-TABS) 100 MG tablet Take 1 tablet (100 mg total) by mouth 2 (two) times daily. 11/14/19  Yes [name], MD  fluticasone (FLONASE) 50 MCG/ACT nasal spray Place 1-2 sprays into both nostrils daily. 04/02/18  Yes [name], MD  glipiZIDE (GLUCOTROL) 10 MG tablet TAKE 2 TABLETS (20 MG TOTAL) BY MOUTH 2 (TWO) TIMES DAILY BEFORE A MEAL. 11/14/19  Yes [name], MD  HYDROcodone-acetaminophen (NORCO/VICODIN) 5-325 MG tablet Take 1 tablet by mouth every 6 (six) hours as needed for moderate pain. 08/09/19  Yes Alphonsa Overall, MD  Lancets MISC Test blood sugar once daily. Dx: E11.9 01/24/18  Yes [name], MD  losartan (COZAAR) 100 MG tablet TAKE 1 TABLET BY MOUTH EVERY DAY 05/08/20  Yes [name], MD  metFORMIN (GLUCOPHAGE) 1000 MG tablet Take 1 tablet (1,000 mg total) by mouth 2 (two) times daily with a meal. 11/14/19  Yes [name], MD  metoprolol (TOPROL-XL) 200 MG 24 hr tablet TAKE 1 TABLET (200 MG TOTAL) BY MOUTH DAILY. 11/14/19  Yes [name], MD  tamoxifen (NOLVADEX) 20 MG tablet Take 1 tablet (20 mg total)  by mouth daily. 10/20/19  Yes Nicholas Lose, MD   Social History   Socioeconomic History  . Marital status: Married    Spouse name: Not on file  . Number of children: Not on file  . Years of education: Not on file  . Highest education level: Not on file  Occupational History  . Not on file  Tobacco Use  . Smoking status: Never Smoker  . Smokeless tobacco: Never Used  Substance and Sexual Activity  . Alcohol use: No  . Drug use: No  . Sexual activity: Not on file  Other Topics Concern  . Not on file  Social History Narrative  . Not on file  Social Determinants of Health   Financial Resource Strain:   . Difficulty of Paying Living Expenses:   Food Insecurity:   . Worried About Charity fundraiser in the Last Year:   . Arboriculturist in the Last Year:   Transportation Needs:   . Film/video editor (Medical):   Marland Kitchen Lack of Transportation (Non-Medical):   Physical Activity:   . Days of Exercise per Week:   . Minutes of Exercise per Session:   Stress:   . Feeling of Stress :   Social Connections:   . Frequency of Communication with Friends and Family:   . Frequency of Social Gatherings with Friends and Family:   . Attends Religious Services:   . Active Member of Clubs or Organizations:   . Attends Archivist Meetings:   Marland Kitchen Marital Status:   Intimate Partner Violence:   . Fear of Current or Ex-Partner:   . Emotionally Abused:   Marland Kitchen Physically Abused:   . Sexually Abused:     Review of Systems  Constitutional: Negative for fatigue and unexpected weight change.  Respiratory: Negative for chest tightness and shortness of breath.   Cardiovascular: Negative for chest pain, palpitations and leg swelling.  Gastrointestinal: Negative for abdominal pain and blood in stool.  Neurological: Negative for dizziness, syncope, light-headedness and headaches.     Objective:   Vitals:   05/13/20 1042  BP: 128/74  Pulse: 68  Temp: 98.4 F (36.9 C)  TempSrc:  Temporal  SpO2: 98%  Weight: 188 lb (85.3 kg)  Height: '5\' 4"'  (1.626 m)     Physical Exam Vitals reviewed.  Constitutional:      Appearance: She is well-developed.  HENT:     Head: Normocephalic and atraumatic.  Eyes:     Conjunctiva/sclera: Conjunctivae normal.     Pupils: Pupils are equal, round, and reactive to light.  Neck:     Vascular: No carotid bruit.  Cardiovascular:     Rate and Rhythm: Normal rate and regular rhythm.     Heart sounds: Normal heart sounds.  Pulmonary:     Effort: Pulmonary effort is normal.     Breath sounds: Normal breath sounds.  Abdominal:     Palpations: Abdomen is soft. There is no pulsatile mass.     Tenderness: There is no abdominal tenderness.  Skin:    General: Skin is warm and dry.     Comments: No discharge, erythema or induration at the umbilicus.  Neurological:     Mental Status: She is alert and oriented to person, place, and time.  Psychiatric:        Mood and Affect: Mood normal.        Behavior: Behavior normal.     Results for orders placed or performed in visit on 05/13/20  Lipid panel  Result Value Ref Range   Cholesterol, Total 123 100 - 199 mg/dL   Triglycerides 91 0 - 149 mg/dL   HDL 42 >39 mg/dL   VLDL Cholesterol Cal 18 5 - 40 mg/dL   LDL Chol Calc (NIH) 63 0 - 99 mg/dL   Chol/HDL Ratio 2.9 0.0 - 4.4 ratio  Comprehensive metabolic panel  Result Value Ref Range   Glucose 104 (H) 65 - 99 mg/dL   BUN 16 8 - 27 mg/dL   Creatinine, Ser 1.07 (H) 0.57 - 1.00 mg/dL   GFR calc non Af Amer 52 (L) >59 mL/min/1.73   GFR calc Af Amer 60 >59 mL/min/1.73  BUN/Creatinine Ratio 15 12 - 28   Sodium 140 134 - 144 mmol/L   Potassium 4.4 3.5 - 5.2 mmol/L   Chloride 104 96 - 106 mmol/L   CO2 20 20 - 29 mmol/L   Calcium 10.5 (H) 8.7 - 10.3 mg/dL   Total Protein 6.9 6.0 - 8.5 g/dL   Albumin 4.0 3.7 - 4.7 g/dL   Globulin, Total 2.9 1.5 - 4.5 g/dL   Albumin/Globulin Ratio 1.4 1.2 - 2.2   Bilirubin Total 0.3 0.0 - 1.2 mg/dL    Alkaline Phosphatase 62 48 - 121 IU/L   AST 16 0 - 40 IU/L   ALT 15 0 - 32 IU/L  Hemoglobin A1c  Result Value Ref Range   Hgb A1c MFr Bld 7.5 (H) 4.8 - 5.6 %   Est. average glucose Bld gHb Est-mCnc 169 mg/dL  POCT Wet + KOH Prep  Result Value Ref Range   Yeast by KOH Absent Absent   Yeast by wet prep Absent Absent   WBC by wet prep None (A) Few   Clue Cells Wet Prep HPF POC Few (A) None   Trich by wet prep Absent Absent   Bacteria Wet Prep HPF POC Moderate (A) Few   Epithelial Cells By [employer] Pref ([facility]) Moderate (A) None, Few, Too numerous to count   RBC,UR,HPF,POC None None RBC/hpf      Assessment & Plan:  Juelz Claar is a 74 y.o. female . Vaginal discharge - Plan: POCT Wet + KOH Prep Bacterial vaginosis - Plan: metroNIDAZOLE (FLAGYL) 500 MG tablet  -Possible mild bacterial vaginosis, no yeast identified.  Flagyl prescribed.  RTC precautions  Essential hypertension - Plan: amLODipine (NORVASC) 10 MG tablet, cloNIDine (CATAPRES) 0.2 MG tablet, metoprolol (TOPROL-XL) 200 MG 24 hr tablet, Lipid panel, Comprehensive metabolic panel  -Can try lower dose of clonidine at 0.2 mg twice daily due to sedation with higher dosing,, but if elevated readings may need to 0.3 mg.  Watch for rebound hypertension.  Mixed hyperlipidemia - Plan: atorvastatin (LIPITOR) 20 MG tablet  The current medical regimen is effective;  continue present plan and medications.    Allergic rhinitis - Plan: cetirizine (ZYRTEC) 10 MG tablet  -Continue Zyrtec  Type 2 diabetes mellitus with microalbuminuria, without long-term current use of insulin (HCC) - Plan: dapagliflozin propanediol (FARXIGA) 10 MG TABS tablet, glipiZIDE (GLUCOTROL) 10 MG tablet, metFORMIN (GLUCOPHAGE) 1000 MG tablet, Hemoglobin A1c  -Tolerating current regimen, check A1c.  Did discuss risk of candidal infections with use of Farxiga, but no sign of yeast infection on today's exam.  Cellulitis, umbilical - Plan: doxycycline (VIBRA-TABS) 100  MG tablet  -Appears okay at present but has had recurrent cellulitis of the umbilicus in the past.  Prescribed doxycycline, printed if symptoms recurred with RTC precautions  Meds ordered this encounter  Medications  . Blood Glucose Monitoring Suppl (LDR BLOOD GLUCOSE TRUETEST) w/Device KIT    Sig: 1 Device by Does not apply route daily. Test blood sugar once daily. Dx: E11.9    Dispense:  1 kit    Refill:  0    FILL WITH THIS OR ANY METER COVERED BY INSURANCE  . amLODipine (NORVASC) 10 MG tablet    Sig: Take 1 tablet (10 mg total) by mouth daily.    Dispense:  90 tablet    Refill:  1  . atorvastatin (LIPITOR) 20 MG tablet    Sig: Take 1 tablet (20 mg total) by mouth daily at 6 PM.    Dispense:  90 tablet    Refill:  1  . cetirizine (ZYRTEC) 10 MG tablet    Sig: Take 1 tablet (10 mg total) by mouth daily.    Dispense:  90 tablet    Refill:  1  . cloNIDine (CATAPRES) 0.2 MG tablet    Sig: Take 1 tablet (0.2 mg total) by mouth 2 (two) times daily. TAKE 1 TABLET (0.2 MG TOTAL) BY MOUTH 2 TIMES DAILY    Dispense:  180 tablet    Refill:  1  . dapagliflozin propanediol (FARXIGA) 10 MG TABS tablet    Sig: Take 1 tablet (10 mg total) by mouth daily.    Dispense:  90 tablet    Refill:  1  . glipiZIDE (GLUCOTROL) 10 MG tablet    Sig: TAKE 2 TABLETS (20 MG TOTAL) BY MOUTH 2 (TWO) TIMES DAILY BEFORE A MEAL.    Dispense:  360 tablet    Refill:  1  . metFORMIN (GLUCOPHAGE) 1000 MG tablet    Sig: Take 1 tablet (1,000 mg total) by mouth 2 (two) times daily with a meal.    Dispense:  180 tablet    Refill:  1  . metoprolol (TOPROL-XL) 200 MG 24 hr tablet    Sig: TAKE 1 TABLET (200 MG TOTAL) BY MOUTH DAILY.    Dispense:  90 tablet    Refill:  1  . doxycycline (VIBRA-TABS) 100 MG tablet    Sig: Take 1 tablet (100 mg total) by mouth 2 (two) times daily.    Dispense:  20 tablet    Refill:  0  . metroNIDAZOLE (FLAGYL) 500 MG tablet    Sig: 1 pill by mouth twice per day.  Avoid any alcohol  while taking this medicine.    Dispense:  14 tablet    Refill:  0   Patient Instructions    Ok to try 1 clonidine (0.51m) twice per day, but if blood pressures elevate, may need to add more medicine. Recheck 2 weeks.   I do not see any sign of infection at the umbilicus today, but if that drainage or redness/swelling returns, start doxycycline.    We can discuss lab results in 2 weeks, no other med changes for now.  Thank you for coming in today.   No sign of yeast infection today but may have a component of bacterial vaginosis or different bacteria causing the vaginal discharge.  If you notice more vaginal discharge, especially with odor, can start the printed prescription for metronidazole twice per day.  Avoid any alcohol while taking that medication.    Bacterial Vaginosis  Bacterial vaginosis is a vaginal infection that occurs when the normal balance of bacteria in the vagina is disrupted. It results from an overgrowth of certain bacteria. This is the most common vaginal infection among women ages 161-44 Because bacterial vaginosis increases your risk for STIs (sexually transmitted infections), getting treated can help reduce your risk for chlamydia, gonorrhea, herpes, and HIV (human immunodeficiency virus). Treatment is also important for preventing complications in pregnant women, because this condition can cause an early (premature) delivery. What are the causes? This condition is caused by an increase in harmful bacteria that are normally present in small amounts in the vagina. However, the reason that the condition develops is not fully understood. What increases the risk? The following factors may make you more likely to develop this condition:  Having a new sexual partner or multiple sexual partners.  Having unprotected sex.  Douching.  Having an  intrauterine device (IUD).  Smoking.  Drug and alcohol abuse.  Taking certain antibiotic medicines.  Being  pregnant. You cannot get bacterial vaginosis from toilet seats, bedding, swimming pools, or contact with objects around you. What are the signs or symptoms? Symptoms of this condition include:  Grey or white vaginal discharge. The discharge can also be watery or foamy.  A fish-like odor with discharge, especially after sexual intercourse or during menstruation.  Itching in and around the vagina.  Burning or pain with urination. Some women with bacterial vaginosis have no signs or symptoms. How is this diagnosed? This condition is diagnosed based on:  Your medical history.  A physical exam of the vagina.  Testing a sample of vaginal fluid under a microscope to look for a large amount of bad bacteria or abnormal cells. Your health care provider may use a cotton swab or a small wooden spatula to collect the sample. How is this treated? This condition is treated with antibiotics. These may be given as a pill, a vaginal cream, or a medicine that is put into the vagina (suppository). If the condition comes back after treatment, a second round of antibiotics may be needed. Follow these instructions at home: Medicines  Take over-the-counter and prescription medicines only as told by your health care provider.  Take or use your antibiotic as told by your health care provider. Do not stop taking or using the antibiotic even if you start to feel better. General instructions  If you have a female sexual partner, tell her that you have a vaginal infection. She should see her health care provider and be treated if she has symptoms. If you have a female sexual partner, he does not need treatment.  During treatment: ? Avoid sexual activity until you finish treatment. ? Do not douche. ? Avoid alcohol as directed by your health care provider. ? Avoid breastfeeding as directed by your health care provider.  Drink enough water and fluids to keep your urine clear or pale yellow.  Keep the area  around your vagina and rectum clean. ? Wash the area daily with warm water. ? Wipe yourself from front to back after using the toilet.  Keep all follow-up visits as told by your health care provider. This is important. How is this prevented?  Do not douche.  Wash the outside of your vagina with warm water only.  Use protection when having sex. This includes latex condoms and dental dams.  Limit how many sexual partners you have. To help prevent bacterial vaginosis, it is best to have sex with just one partner (monogamous).  Make sure you and your sexual partner are tested for STIs.  Wear cotton or cotton-lined underwear.  Avoid wearing tight pants and pantyhose, especially during summer.  Limit the amount of alcohol that you drink.  Do not use any products that contain nicotine or tobacco, such as cigarettes and e-cigarettes. If you need help quitting, ask your health care provider.  Do not use illegal drugs. Where to find more information  Centers for Disease Control and Prevention: AppraiserFraud.fi  American Sexual Health Association (ASHA): www.ashastd.org  U.S. Department of Health and Financial controller, Office on Women's Health: DustingSprays.pl or SecuritiesCard.it Contact a health care provider if:  Your symptoms do not improve, even after treatment.  You have more discharge or pain when urinating.  You have a fever.  You have pain in your abdomen.  You have pain during sex.  You have vaginal bleeding between periods. Summary  Bacterial vaginosis is a vaginal infection that occurs when the normal balance of bacteria in the vagina is disrupted.  Because bacterial vaginosis increases your risk for STIs (sexually transmitted infections), getting treated can help reduce your risk for chlamydia, gonorrhea, herpes, and HIV (human immunodeficiency virus). Treatment is also important for preventing complications in pregnant  women, because the condition can cause an early (premature) delivery.  This condition is treated with antibiotic medicines. These may be given as a pill, a vaginal cream, or a medicine that is put into the vagina (suppository). This information is not intended to replace advice given to you by your health care provider. Make sure you discuss any questions you have with your health care provider. Document Revised: 11/03/2017 Document Reviewed: 08/06/2016 Elsevier Patient Education  [employer].    If you have lab work done today you will be contacted with your lab results within the next 2 weeks.  If you have not heard from [place] then please contact us. The fastest way to get your results is to register for My Chart.   IF you received an x-ray today, you will receive an invoice from [facility]. Please contact [facility] at [phone] with questions or concerns regarding your invoice.   IF you received labwork today, you will receive an invoice from [facility]. Please contact [facility] at [phone] with questions or concerns regarding your invoice.   Our billing staff will not be able to assist you with questions regarding bills from these companies.  You will be contacted with the lab results as soon as they are available. The fastest way to get your results is to activate your My Chart account. Instructions are located on the last page of this paperwork. If you have not heard from [place] regarding the results in 2 weeks, please contact this office.         Signed, [name], MD [facility]

## 2020-05-13 NOTE — Patient Instructions (Addendum)
Ok to try 1 clonidine (0.2mg ) twice per day, but if blood pressures elevate, may need to add more medicine. Recheck 2 weeks.   I do not see any sign of infection at the umbilicus today, but if that drainage or redness/swelling returns, start doxycycline.    We can discuss lab results in 2 weeks, no other med changes for now.  Thank you for coming in today.   No sign of yeast infection today but may have a component of bacterial vaginosis or different bacteria causing the vaginal discharge.  If you notice more vaginal discharge, especially with odor, can start the printed prescription for metronidazole twice per day.  Avoid any alcohol while taking that medication.    Bacterial Vaginosis  Bacterial vaginosis is a vaginal infection that occurs when the normal balance of bacteria in the vagina is disrupted. It results from an overgrowth of certain bacteria. This is the most common vaginal infection among women ages 18-44. Because bacterial vaginosis increases your risk for STIs (sexually transmitted infections), getting treated can help reduce your risk for chlamydia, gonorrhea, herpes, and HIV (human immunodeficiency virus). Treatment is also important for preventing complications in pregnant women, because this condition can cause an early (premature) delivery. What are the causes? This condition is caused by an increase in harmful bacteria that are normally present in small amounts in the vagina. However, the reason that the condition develops is not fully understood. What increases the risk? The following factors may make you more likely to develop this condition:  Having a new sexual partner or multiple sexual partners.  Having unprotected sex.  Douching.  Having an intrauterine device (IUD).  Smoking.  Drug and alcohol abuse.  Taking certain antibiotic medicines.  Being pregnant. You cannot get bacterial vaginosis from toilet seats, bedding, swimming pools, or contact with  objects around you. What are the signs or symptoms? Symptoms of this condition include:  Grey or white vaginal discharge. The discharge can also be watery or foamy.  A fish-like odor with discharge, especially after sexual intercourse or during menstruation.  Itching in and around the vagina.  Burning or pain with urination. Some women with bacterial vaginosis have no signs or symptoms. How is this diagnosed? This condition is diagnosed based on:  Your medical history.  A physical exam of the vagina.  Testing a sample of vaginal fluid under a microscope to look for a large amount of bad bacteria or abnormal cells. Your health care provider may use a cotton swab or a small wooden spatula to collect the sample. How is this treated? This condition is treated with antibiotics. These may be given as a pill, a vaginal cream, or a medicine that is put into the vagina (suppository). If the condition comes back after treatment, a second round of antibiotics may be needed. Follow these instructions at home: Medicines  Take over-the-counter and prescription medicines only as told by your health care provider.  Take or use your antibiotic as told by your health care provider. Do not stop taking or using the antibiotic even if you start to feel better. General instructions  If you have a female sexual partner, tell her that you have a vaginal infection. She should see her health care provider and be treated if she has symptoms. If you have a female sexual partner, he does not need treatment.  During treatment: ? Avoid sexual activity until you finish treatment. ? Do not douche. ? Avoid alcohol as directed by your health care  provider. ? Avoid breastfeeding as directed by your health care provider.  Drink enough water and fluids to keep your urine clear or pale yellow.  Keep the area around your vagina and rectum clean. ? Wash the area daily with warm water. ? Wipe yourself from front to  back after using the toilet.  Keep all follow-up visits as told by your health care provider. This is important. How is this prevented?  Do not douche.  Wash the outside of your vagina with warm water only.  Use protection when having sex. This includes latex condoms and dental dams.  Limit how many sexual partners you have. To help prevent bacterial vaginosis, it is best to have sex with just one partner (monogamous).  Make sure you and your sexual partner are tested for STIs.  Wear cotton or cotton-lined underwear.  Avoid wearing tight pants and pantyhose, especially during summer.  Limit the amount of alcohol that you drink.  Do not use any products that contain nicotine or tobacco, such as cigarettes and e-cigarettes. If you need help quitting, ask your health care provider.  Do not use illegal drugs. Where to find more information  Centers for Disease Control and Prevention: AppraiserFraud.fi  American Sexual Health Association (ASHA): www.ashastd.org  [place] Department of Health and Financial controller, Office on Women's Health: DustingSprays.pl or SecuritiesCard.it Contact a health care provider if:  Your symptoms do not improve, even after treatment.  You have more discharge or pain when urinating.  You have a fever.  You have pain in your abdomen.  You have pain during sex.  You have vaginal bleeding between periods. Summary  Bacterial vaginosis is a vaginal infection that occurs when the normal balance of bacteria in the vagina is disrupted.  Because bacterial vaginosis increases your risk for STIs (sexually transmitted infections), getting treated can help reduce your risk for chlamydia, gonorrhea, herpes, and HIV (human immunodeficiency virus). Treatment is also important for preventing complications in pregnant women, because the condition can cause an early (premature) delivery.  This condition is treated with  antibiotic medicines. These may be given as a pill, a vaginal cream, or a medicine that is put into the vagina (suppository). This information is not intended to replace advice given to you by your health care provider. Make sure you discuss any questions you have with your health care provider. Document Revised: 11/03/2017 Document Reviewed: 08/06/2016 Elsevier Patient Education  [employer].    If you have lab work done today you will be contacted with your lab results within the next 2 weeks.  If you have not heard from [place] then please contact us. The fastest way to get your results is to register for My Chart.   IF you received an x-ray today, you will receive an invoice from [facility]. Please contact [facility] at [phone] with questions or concerns regarding your invoice.   IF you received labwork today, you will receive an invoice from [place]. Please contact [facility] at [phone] with questions or concerns regarding your invoice.   Our billing staff will not be able to assist you with questions regarding bills from these companies.  You will be contacted with the lab results as soon as they are available. The fastest way to get your results is to activate your My Chart account. Instructions are located on the last page of this paperwork. If you have not heard from [place] regarding the results in 2 weeks, please contact this office.

## 2020-05-14 ENCOUNTER — Encounter: Payer: Self-pay | Admitting: Family Medicine

## 2020-05-14 LAB — LIPID PANEL
Chol/HDL Ratio: 2.9 ratio (ref 0.0–4.4)
Cholesterol, Total: 123 mg/dL (ref 100–199)
HDL: 42 mg/dL (ref >39)
LDL Chol Calc (NIH): 63 mg/dL (ref 0–99)
Triglycerides: 91 mg/dL (ref 0–149)
VLDL Cholesterol Cal: 18 mg/dL (ref 5–40)

## 2020-05-14 LAB — COMPREHENSIVE METABOLIC PANEL
ALT: 15 IU/L (ref 0–32)
AST: 16 IU/L (ref 0–40)
Albumin/Globulin Ratio: 1.4 (ref 1.2–2.2)
Albumin: 4 g/dL (ref 3.7–4.7)
Alkaline Phosphatase: 62 IU/L (ref 48–121)
BUN/Creatinine Ratio: 15 (ref 12–28)
BUN: 16 mg/dL (ref 8–27)
Bilirubin Total: 0.3 mg/dL (ref 0.0–1.2)
CO2: 20 mmol/L (ref 20–29)
Calcium: 10.5 mg/dL — ABNORMAL HIGH (ref 8.7–10.3)
Chloride: 104 mmol/L (ref 96–106)
Creatinine, Ser: 1.07 mg/dL — ABNORMAL HIGH (ref 0.57–1.00)
GFR calc Af Amer: 60 mL/min/{1.73_m2} (ref >59)
GFR calc non Af Amer: 52 mL/min/{1.73_m2} — ABNORMAL LOW (ref >59)
Globulin, Total: 2.9 g/dL (ref 1.5–4.5)
Glucose: 104 mg/dL — ABNORMAL HIGH (ref 65–99)
Potassium: 4.4 mmol/L (ref 3.5–5.2)
Sodium: 140 mmol/L (ref 134–144)
Total Protein: 6.9 g/dL (ref 6.0–8.5)

## 2020-05-14 LAB — HEMOGLOBIN A1C
Est. average glucose Bld gHb Est-mCnc: 169 mg/dL
Hgb A1c MFr Bld: 7.5 % — ABNORMAL HIGH (ref 4.8–5.6)

## 2020-05-27 ENCOUNTER — Other Ambulatory Visit: Payer: Self-pay

## 2020-05-27 ENCOUNTER — Encounter: Payer: Self-pay | Admitting: Family Medicine

## 2020-05-27 ENCOUNTER — Ambulatory Visit (INDEPENDENT_AMBULATORY_CARE_PROVIDER_SITE_OTHER): Payer: Medicare HMO | Admitting: Family Medicine

## 2020-05-27 VITALS — BP 140/80 | HR 80 | Temp 98.6°F | Ht 64.0 in | Wt 189.0 lb

## 2020-05-27 DIAGNOSIS — E1129 Type 2 diabetes mellitus with other diabetic kidney complication: Secondary | ICD-10-CM

## 2020-05-27 DIAGNOSIS — R809 Proteinuria, unspecified: Secondary | ICD-10-CM

## 2020-05-27 DIAGNOSIS — I10 Essential (primary) hypertension: Secondary | ICD-10-CM | POA: Diagnosis not present

## 2020-05-27 NOTE — Patient Instructions (Addendum)
Keep a record of your blood pressures outside of the office and bring them to the next office visit in 3 months. Low intensity exercise such as walking or elliptical can help with weight, diabetes. Keep up the good work with use of elliptical!   How to Take Your Blood Pressure You can take your blood pressure at home with a machine. You may need to check your blood pressure at home:  To check if you have high blood pressure (hypertension).  To check your blood pressure over time.  To make sure your blood pressure medicine is working. Supplies needed: You will need a blood pressure machine, or monitor. You can buy one at a drugstore or online. When choosing one:  Choose one with an arm cuff.  Choose one that wraps around your upper arm. Only one finger should fit between your arm and the cuff.  Do not choose one that measures your blood pressure from your wrist or finger. Your doctor can suggest a monitor. How to prepare Avoid these things for 30 minutes before checking your blood pressure:  Drinking caffeine.  Drinking alcohol.  Eating.  Smoking.  Exercising. Five minutes before checking your blood pressure:  Pee.  Sit in a dining chair. Avoid sitting in a soft couch or armchair.  Be quiet. Do not talk. How to take your blood pressure Follow the instructions that came with your machine. If you have a digital blood pressure monitor, these may be the instructions: 1. Sit up straight. 2. Place your feet on the floor. Do not cross your ankles or legs. 3. Rest your left arm at the level of your heart. You may rest it on a table, desk, or chair. 4. Pull up your shirt sleeve. 5. Wrap the blood pressure cuff around the upper part of your left arm. The cuff should be 1 inch (2.5 cm) above your elbow. It is best to wrap the cuff around bare skin. 6. Fit the cuff snugly around your arm. You should be able to place only one finger between the cuff and your arm. 7. Put the cord  inside the groove of your elbow. 8. Press the power button. 9. Sit quietly while the cuff fills with air and loses air. 10. Write down the numbers on the screen. 11. Wait 2-3 minutes and then repeat steps 1-10. What do the numbers mean? Two numbers make up your blood pressure. The first number is called systolic pressure. The second is called diastolic pressure. An example of a blood pressure reading is "120 over 80" (or 120/80). If you are an adult and do not have a medical condition, use this guide to find out if your blood pressure is normal: Normal  First number: below 120.  Second number: below 80. Elevated  First number: 120-129.  Second number: below 80. Hypertension stage 1  First number: 130-139.  Second number: 80-89. Hypertension stage 2  First number: 140 or above.  Second number: 38 or above. Your blood pressure is above normal even if only the top or bottom number is above normal. Follow these instructions at home:  Check your blood pressure as often as your doctor tells you to.  Take your monitor to your next doctor's appointment. Your doctor will: ? Make sure you are using it correctly. ? Make sure it is working right.  Make sure you understand what your blood pressure numbers should be.  Tell your doctor if your medicines are causing side effects. Contact a doctor if:  Your blood pressure keeps being high. Get help right away if:  Your first blood pressure number is higher than 180.  Your second blood pressure number is higher than 120. This information is not intended to replace advice given to you by your health care provider. Make sure you discuss any questions you have with your health care provider. Document Revised: 11/03/2017 Document Reviewed: 04/29/2016 Elsevier Patient Education  [employer].    If you have lab work done today you will be contacted with your lab results within the next 2 weeks.  If you have not heard from [place] then  please contact us. The fastest way to get your results is to register for My Chart.   IF you received an x-ray today, you will receive an invoice from [facility]. Please contact [facility] at [phone] with questions or concerns regarding your invoice.   IF you received labwork today, you will receive an invoice from [place]. Please contact [facility] at [phone] with questions or concerns regarding your invoice.   Our billing staff will not be able to assist you with questions regarding bills from these companies.  You will be contacted with the lab results as soon as they are available. The fastest way to get your results is to activate your My Chart account. Instructions are located on the last page of this paperwork. If you have not heard from [place] regarding the results in 2 weeks, please contact this office.

## 2020-05-27 NOTE — Progress Notes (Signed)
Subjective:  Patient ID: Susan Miles, female    DOB: Aug 13, 1946  Age: 74 y.o. MRN: [id number]  CC:  Chief Complaint  Patient presents with  . Hypertension    Pt reports no issues with BP since last OV. pt has been taking her BP 2x daily one when the pt first wakes up and another one shortly after taking BP medication. pt has broguht her readings in with her today. Pt reports no phyiscal symptoms since last OV. pt also reports taking medication as directed with no known side effects.    HPI [name] presents for   Hypertension: Follow-up from June 9, some sedation with clonidine at 0.3 mg.  Decreased to 0.2 mg twice daily. Less tired feeling. BP up some in the morning; Home readings before clonidine 140/90, 152/78, 159/89, 152/82, 160/88, 157/92, 165/95. Blood pressure in the afternoon ranging from 120/79-146/78. Losartan and clonidine 0.45m in the morning, then amlodipine, toprol, clonidine at night.  BP Readings from Last 3 Encounters:  05/27/20 140/80  05/13/20 128/74  02/12/20 136/81   Lab Results  Component Value Date   CREATININE 1.07 (H) 05/13/2020   Diabetes, see last visit Lab letter from yesterday will be provided to patient.  A1c 7.5 at last visit but glucose in office 104.  No med changes, plan for diet/activity approach with repeat testing in 3 months. Home readings in am 107-114.  No added salt to meals, cooks for her self.  Has recently started back on elliptical.   History Patient Active Problem List   Diagnosis Date Noted  . Family history of breast cancer   . Ductal carcinoma in situ (DCIS) of right breast 07/04/2019  . Hyperlipidemia 12/21/2017  . Breast asymmetry 05/01/2015  . Cataract, nuclear 05/24/2012  . HTN (hypertension) 02/10/2012  . DM2 (diabetes mellitus, type 2) ([place] 02/10/2012   Past Medical History:  Diagnosis Date  . Cancer ([facility]    right breast cancer  . Diabetes mellitus without complication ([facility]   . Family history  of breast cancer   . Hypertension   . Sleep apnea    does not use CPAP   Past Surgical History:  Procedure Laterality Date  . ABDOMINAL HYSTERECTOMY    . BREAST LUMPECTOMY WITH RADIOACTIVE SEED LOCALIZATION Right 08/09/2019   Procedure: RIGHT BREAST LUMPECTOMY X 2 WITH RADIOACTIVE SEED LOCALIZATION (3 SEEDS);  Surgeon: NAlphonsa Overall MD;  [place] [facility]  Service: General;  Laterality: Right;   Allergies  Allergen Reactions  . Ace Inhibitors Cough   Prior to Admission medications   Medication Sig Start Date End Date Taking? Authorizing Provider  ACCU-CHEK AVIVA PLUS test strip USE TO TEST BLOOD SUGAR ONCE DAILY. DX E11.9 01/21/20  Yes GWendie Agreste MD  acetaminophen (TYLENOL) 500 MG tablet Take 500 mg by mouth every 6 (six) hours as needed for mild pain.   Yes [provider]  amLODipine (NORVASC) 10 MG tablet Take 1 tablet (10 mg total) by mouth daily. 05/13/20  Yes GWendie Agreste MD  aspirin 81 MG tablet Take 81 mg by mouth daily.   Yes [provider]  atorvastatin (LIPITOR) 20 MG tablet Take 1 tablet (20 mg total) by mouth daily at 6 PM. 05/13/20  Yes GWendie Agreste MD  Blood Glucose Monitoring Suppl (LDR BLOOD GLUCOSE TRUETEST) w/Device KIT 1 Device by Does not apply route daily. Test blood sugar once daily. Dx: E11.9 05/13/20  Yes GWendie Agreste MD  cetirizine (Alethia Berthold  10 MG tablet Take 1 tablet (10 mg total) by mouth daily. 05/13/20  Yes [name], MD  cloNIDine (CATAPRES) 0.2 MG tablet Take 1 tablet (0.2 mg total) by mouth 2 (two) times daily. TAKE 1 TABLET (0.2 MG TOTAL) BY MOUTH 2 TIMES DAILY 05/13/20  Yes [name], MD  dapagliflozin propanediol (FARXIGA) 10 MG TABS tablet Take 1 tablet (10 mg total) by mouth daily. 05/13/20  Yes [name], MD  doxycycline (VIBRA-TABS) 100 MG tablet Take 1 tablet (100 mg total) by mouth 2 (two) times daily. 05/13/20  Yes [name], MD  fluticasone (FLONASE) 50 MCG/ACT nasal  spray Place 1-2 sprays into both nostrils daily. 04/02/18  Yes [name], MD  glipiZIDE (GLUCOTROL) 10 MG tablet TAKE 2 TABLETS (20 MG TOTAL) BY MOUTH 2 (TWO) TIMES DAILY BEFORE A MEAL. 05/13/20  Yes [name], MD  HYDROcodone-acetaminophen (NORCO/VICODIN) 5-325 MG tablet Take 1 tablet by mouth every 6 (six) hours as needed for moderate pain. 08/09/19  Yes Alphonsa Overall, MD  Lancets MISC Test blood sugar once daily. Dx: E11.9 01/24/18  Yes [name], MD  losartan (COZAAR) 100 MG tablet TAKE 1 TABLET BY MOUTH EVERY DAY 05/08/20  Yes [name], MD  metFORMIN (GLUCOPHAGE) 1000 MG tablet Take 1 tablet (1,000 mg total) by mouth 2 (two) times daily with a meal. 05/13/20  Yes [name], MD  metoprolol (TOPROL-XL) 200 MG 24 hr tablet TAKE 1 TABLET (200 MG TOTAL) BY MOUTH DAILY. 05/13/20  Yes [name], MD  metroNIDAZOLE (FLAGYL) 500 MG tablet 1 pill by mouth twice per day.  Avoid any alcohol while taking this medicine. 05/13/20  Yes [name], MD  tamoxifen (NOLVADEX) 20 MG tablet Take 1 tablet (20 mg total) by mouth daily. 10/20/19  Yes Nicholas Lose, MD   Social History   Socioeconomic History  . Marital status: Married    Spouse name: Not on file  . Number of children: Not on file  . Years of education: Not on file  . Highest education level: Not on file  Occupational History  . Not on file  Tobacco Use  . Smoking status: Never Smoker  . Smokeless tobacco: Never Used  Substance and Sexual Activity  . Alcohol use: No  . Drug use: No  . Sexual activity: Not on file  Other Topics Concern  . Not on file  Social History Narrative  . Not on file   Social Determinants of Health   Financial Resource Strain:   . Difficulty of Paying Living Expenses:   Food Insecurity:   . Worried About Charity fundraiser in the Last Year:   . Arboriculturist in the Last Year:   Transportation Needs:   . Film/video editor (Medical):   Marland Kitchen Lack of Transportation  (Non-Medical):   Physical Activity:   . Days of Exercise per Week:   . Minutes of Exercise per Session:   Stress:   . Feeling of Stress :   Social Connections:   . Frequency of Communication with Friends and Family:   . Frequency of Social Gatherings with Friends and Family:   . Attends Religious Services:   . Active Member of Clubs or Organizations:   . Attends Archivist Meetings:   Marland Kitchen Marital Status:   Intimate Partner Violence:   . Fear of Current or Ex-Partner:   . Emotionally Abused:   Marland Kitchen Physically Abused:   .  Sexually Abused:     Review of Systems Per HPI.   Objective:   Vitals:   05/27/20 1407 05/27/20 1410  BP: (!) 144/87 140/80  Pulse: 80   Temp: 98.6 F (37 C)   TempSrc: Temporal   SpO2: 98%   Weight: 189 lb (85.7 kg)   Height: _0  (1.626 m)      Physical Exam Vitals reviewed.  Constitutional:      General: She is not in acute distress.    Appearance: She is well-developed.  HENT:     Head: Normocephalic and atraumatic.  Eyes:     Conjunctiva/sclera: Conjunctivae normal.     Pupils: Pupils are equal, round, and reactive to light.  Neck:     Vascular: No carotid bruit.  Cardiovascular:     Rate and Rhythm: Normal rate and regular rhythm.     Heart sounds: Normal heart sounds.  Pulmonary:     Effort: Pulmonary effort is normal.     Breath sounds: Normal breath sounds.  Abdominal:     Palpations: Abdomen is soft. There is no pulsatile mass.  Skin:    General: Skin is warm and dry.  Neurological:     Mental Status: She is alert and oriented to person, place, and time.  Psychiatric:        Behavior: Behavior normal.        Assessment & Plan:  Syrah Daughtrey is a 74 y.o. female . Essential hypertension  -Home readings better in the afternoon than in the morning and tolerates lower dose of clonidine.  She does plan on increase elliptical/low intensity exercise.  Decided against med changes at this time.  If readings above 160  at home, would consider changing nighttime medications.  Recheck 3 months.  RTC precautions.  Type 2 diabetes mellitus with microalbuminuria, without long-term current use of insulin ([place])  -Reviewed labs with patient.  Anticipate improvement in A1c with continued exercise, and lower readings in the morning.  Consider monitoring glucose at various times of the day, but at this time we will not change any meds.   25-monthfollow-up  No orders of the defined types were placed in this encounter.  Patient Instructions    Keep a record of your blood pressures outside of the office and bring them to the next office visit in 3 months. Low intensity exercise such as walking or elliptical can help with weight, diabetes. Keep up the good work with use of elliptical!   How to Take Your Blood Pressure You can take your blood pressure at home with a machine. You may need to check your blood pressure at home:  To check if you have high blood pressure (hypertension).  To check your blood pressure over time.  To make sure your blood pressure medicine is working. Supplies needed: You will need a blood pressure machine, or monitor. You can buy one at a drugstore or online. When choosing one:  Choose one with an arm cuff.  Choose one that wraps around your upper arm. Only one finger should fit between your arm and the cuff.  Do not choose one that measures your blood pressure from your wrist or finger. Your doctor can suggest a monitor. How to prepare Avoid these things for 30 minutes before checking your blood pressure:  Drinking caffeine.  Drinking alcohol.  Eating.  Smoking.  Exercising. Five minutes before checking your blood pressure:  Pee.  Sit in a dining chair. Avoid sitting in a soft  couch or armchair.  Be quiet. Do not talk. How to take your blood pressure Follow the instructions that came with your machine. If you have a digital blood pressure monitor, these may be the  instructions: 1. Sit up straight. 2. Place your feet on the floor. Do not cross your ankles or legs. 3. Rest your left arm at the level of your heart. You may rest it on a table, desk, or chair. 4. Pull up your shirt sleeve. 5. Wrap the blood pressure cuff around the upper part of your left arm. The cuff should be 1 inch (2.5 cm) above your elbow. It is best to wrap the cuff around bare skin. 6. Fit the cuff snugly around your arm. You should be able to place only one finger between the cuff and your arm. 7. Put the cord inside the groove of your elbow. 8. Press the power button. 9. Sit quietly while the cuff fills with air and loses air. 10. Write down the numbers on the screen. 11. Wait 2-3 minutes and then repeat steps 1-10. What do the numbers mean? Two numbers make up your blood pressure. The first number is called systolic pressure. The second is called diastolic pressure. An example of a blood pressure reading is "120 over 80" (or 120/80). If you are an adult and do not have a medical condition, use this guide to find out if your blood pressure is normal: Normal  First number: below 120.  Second number: below 80. Elevated  First number: 120-129.  Second number: below 80. Hypertension stage 1  First number: 130-139.  Second number: 80-89. Hypertension stage 2  First number: 140 or above.  Second number: 32 or above. Your blood pressure is above normal even if only the top or bottom number is above normal. Follow these instructions at home:  Check your blood pressure as often as your doctor tells you to.  Take your monitor to your next doctor's appointment. Your doctor will: ? Make sure you are using it correctly. ? Make sure it is working right.  Make sure you understand what your blood pressure numbers should be.  Tell your doctor if your medicines are causing side effects. Contact a doctor if:  Your blood pressure keeps being high. Get help right away  if:  Your first blood pressure number is higher than 180.  Your second blood pressure number is higher than 120. This information is not intended to replace advice given to you by your health care provider. Make sure you discuss any questions you have with your health care provider. Document Revised: 11/03/2017 Document Reviewed: 04/29/2016 Elsevier Patient Education  [employer].    If you have lab work done today you will be contacted with your lab results within the next 2 weeks.  If you have not heard from [place] then please contact us. The fastest way to get your results is to register for My Chart.   IF you received an x-ray today, you will receive an invoice from [facility] Radiology. Please contact [facility] at [phone] with questions or concerns regarding your invoice.   IF you received labwork today, you will receive an invoice from [place]. Please contact [facility] at [phone] with questions or concerns regarding your invoice.   Our billing staff will not be able to assist you with questions regarding bills from these companies.  You will be contacted with the lab results as soon as they are available. The fastest way to get your  results is to activate your My Chart account. Instructions are located on the last page of this paperwork. If you have not heard from [place] regarding the results in 2 weeks, please contact this office.         Signed, [name], MD [facility]

## 2020-06-22 ENCOUNTER — Ambulatory Visit
Admission: RE | Admit: 2020-06-22 | Discharge: 2020-06-22 | Disposition: A | Discharge status: Home / Self Care | Payer: Medicare HMO | Source: Ambulatory Visit | Attending: Adult Health | Admitting: Adult Health

## 2020-06-22 ENCOUNTER — Other Ambulatory Visit: Payer: Self-pay

## 2020-06-22 DIAGNOSIS — R928 Other abnormal and inconclusive findings on diagnostic imaging of breast: Secondary | ICD-10-CM | POA: Diagnosis not present

## 2020-06-22 DIAGNOSIS — D0511 Intraductal carcinoma in situ of right breast: Secondary | ICD-10-CM

## 2020-06-22 DIAGNOSIS — Z853 Personal history of malignant neoplasm of breast: Secondary | ICD-10-CM | POA: Diagnosis not present

## 2020-06-22 HISTORY — DX: Personal history of irradiation: Z92.3

## 2020-06-22 HISTORY — DX: Malignant neoplasm of unspecified site of unspecified female breast: C50.919

## 2020-07-05 NOTE — Progress Notes (Signed)
Patient Care Team: [name], MD as PCP - General (Family Medicine) Webb Laws, [place] as Referring Physician (Optometry) Nicholas Lose, MD as Consulting Physician (Hematology and Oncology) Kyung Rudd, MD as Consulting Physician (Radiation Oncology) Alphonsa Overall, MD as Consulting Physician (General Surgery)  DIAGNOSIS:    ICD-10-CM   1. Ductal carcinoma in situ (DCIS) of right breast  D05.11     SUMMARY OF ONCOLOGIC HISTORY: Oncology History  Ductal carcinoma in situ (DCIS) of right breast  07/04/2019 Initial Diagnosis   Routine screening mammogram detected 1.6cm mass in the right breast at the 9 o'clock position, loosely group calcifications in the upper right breast spanning 3.6cm, and no axillary adenopathy. Biopsy showed intermediate grade DCIS, ER 100%, PR 100%.    07/10/2019 Cancer Staging   Staging form: Breast, AJCC 8th Edition - Clinical stage from 07/10/2019: Stage 0 (cTis (DCIS), cN0, cM0, ER+, PR+, HER2: Not Assessed) - Signed by Nicholas Lose, MD on 07/10/2019   08/09/2019 Surgery   Right lumpectomy Lucia Gaskins) [id number]): multifocal low grade DCIS spanning 1.4cm and 0.9cm, clear margins.    09/09/2019 - 10/04/2019 Radiation Therapy   The patient initially received a dose of 42.56 Gy in 16 fractions to the breast using whole-breast tangent fields. This was delivered using a 3-D conformal technique. The patient then received a boost to the seroma. This delivered an additional 8 Gy in 89factions using a 3 field photon technique due to the depth of the seroma. The total dose was 50.56 Gy.   10/2019 - 10/2024 Anti-estrogen oral therapy   Tamoxifen daily     CHIEF COMPLIANT: Follow-up of right breast DCIS on tamoxifen  INTERVAL HISTORY: Susan Miles a 74y.o. with above-mentioned history of right breast DCIS who underwent a lumpectomy, radiation, and is currently on anti-estrogen therapy with tamoxifen. Mammogram on 06/22/20 showed no evidence of malignancy  bilaterally. She presents to the clinic today for follow-up.  She is tolerating tamoxifen extremely well without any problems or concerns.  She does have vaginal discharge for which she was recently put on antibiotics and the vaginal discharge has improved.  She denies any hot flashes or arthralgias or myalgias.  ALLERGIES:  is allergic to ace inhibitors.  MEDICATIONS:  Current Outpatient Medications  Medication Sig Dispense Refill  . ACCU-CHEK AVIVA PLUS test strip USE TO TEST BLOOD SUGAR ONCE DAILY. DX E11.9 100 strip 1  . acetaminophen (TYLENOL) 500 MG tablet Take 500 mg by mouth every 6 (six) hours as needed for mild pain.    .Marland KitchenamLODipine (NORVASC) 10 MG tablet Take 1 tablet (10 mg total) by mouth daily. 90 tablet 1  . aspirin 81 MG tablet Take 81 mg by mouth daily.    .Marland Kitchenatorvastatin (LIPITOR) 20 MG tablet Take 1 tablet (20 mg total) by mouth daily at 6 PM. 90 tablet 1  . Blood Glucose Monitoring Suppl (LDR BLOOD GLUCOSE TRUETEST) w/Device KIT 1 Device by Does not apply route daily. Test blood sugar once daily. Dx: E11.9 1 kit 0  . cetirizine (ZYRTEC) 10 MG tablet Take 1 tablet (10 mg total) by mouth daily. 90 tablet 1  . cloNIDine (CATAPRES) 0.2 MG tablet Take 1 tablet (0.2 mg total) by mouth 2 (two) times daily. TAKE 1 TABLET (0.2 MG TOTAL) BY MOUTH 2 TIMES DAILY 180 tablet 1  . dapagliflozin propanediol (FARXIGA) 10 MG TABS tablet Take 1 tablet (10 mg total) by mouth daily. 90 tablet 1  . doxycycline (VIBRA-TABS) 100 MG tablet  Take 1 tablet (100 mg total) by mouth 2 (two) times daily. 20 tablet 0  . fluticasone (FLONASE) 50 MCG/ACT nasal spray Place 1-2 sprays into both nostrils daily. 16 g 6  . glipiZIDE (GLUCOTROL) 10 MG tablet TAKE 2 TABLETS (20 MG TOTAL) BY MOUTH 2 (TWO) TIMES DAILY BEFORE A MEAL. 360 tablet 1  . HYDROcodone-acetaminophen (NORCO/VICODIN) 5-325 MG tablet Take 1 tablet by mouth every 6 (six) hours as needed for moderate pain. 15 tablet 0  . Lancets MISC Test blood sugar  once daily. Dx: E11.9 100 each 3  . losartan (COZAAR) 100 MG tablet TAKE 1 TABLET BY MOUTH EVERY DAY 90 tablet 0  . metFORMIN (GLUCOPHAGE) 1000 MG tablet Take 1 tablet (1,000 mg total) by mouth 2 (two) times daily with a meal. 180 tablet 1  . metoprolol (TOPROL-XL) 200 MG 24 hr tablet TAKE 1 TABLET (200 MG TOTAL) BY MOUTH DAILY. 90 tablet 1  . metroNIDAZOLE (FLAGYL) 500 MG tablet 1 pill by mouth twice per day.  Avoid any alcohol while taking this medicine. 14 tablet 0  . tamoxifen (NOLVADEX) 20 MG tablet Take 1 tablet (20 mg total) by mouth daily. 90 tablet 3   No current facility-administered medications for this visit.    PHYSICAL EXAMINATION: ECOG PERFORMANCE STATUS: 1 - Symptomatic but completely ambulatory  There were no vitals filed for this visit. There were no vitals filed for this visit.  BREAST: No palpable masses or nodules in either right or left breasts. No palpable axillary supraclavicular or infraclavicular adenopathy no breast tenderness or nipple discharge. (exam performed in the presence of a chaperone)  LABORATORY DATA:  I have reviewed the data as listed CMP Latest Ref Rng & Units 05/13/2020 11/14/2019 08/06/2019  Glucose 65 - 99 mg/dL 104(H) 83 173(H)  BUN 8 - 27 mg/dL '16 16 15  ' Creatinine 0.57 - 1.00 mg/dL 1.07(H) 1.00 1.09(H)  Sodium 134 - 144 mmol/L 140 139 138  Potassium 3.5 - 5.2 mmol/L 4.4 3.9 4.2  Chloride 96 - 106 mmol/L 104 104 105  CO2 20 - 29 mmol/L 20 18(L) 22  Calcium 8.7 - 10.3 mg/dL 10.5(H) 10.8(H) 10.6(H)  Total Protein 6.0 - 8.5 g/dL 6.9 7.1 -  Total Bilirubin 0.0 - 1.2 mg/dL 0.3 0.5 -  Alkaline Phos 48 - 121 IU/L 62 95 -  AST 0 - 40 IU/L 16 14 -  ALT 0 - 32 IU/L 15 10 -    Lab Results  Component Value Date   WBC 5.4 07/10/2019   HGB 13.1 07/10/2019   HCT 41.5 07/10/2019   MCV 85.7 07/10/2019   PLT 329 07/10/2019   NEUTROABS 3.0 07/10/2019    ASSESSMENT & PLAN:  Ductal carcinoma in situ (DCIS) of right breast 07/04/2019:Routine  screening mammogram detected 1.6cm mass in the right breast at the 9 o'clock position, loosely group calcifications in the upper right breast spanning 3.6cm, and no axillary adenopathy. Biopsy showed intermediate grade DCIS, ER 100%, PR 100%. Tix Nx Stage 0  Treatment summary: 1.08/09/2019: Right lumpectomy: DCIS with calcifications, low-grade, 1.4 cm, ALH, margins negative, second lumpectomy: DCIS with calcifications, low-grade, 0.9 cm, ER 100%, PR 100%, Tis NX stage 0 2. Adjuvant radiation therapy will be completed 10/04/2019 3.Adjuvant antiestrogen therapy with tamoxifen x5 years started 09/30/2019  Tamoxifen toxicities:  Breast cancer surveillance: 1.  Breast exam 07/06/2020: Benign 2. Mammogram 06/22/2020: Benign breast density category B  Return to clinic in 1 year for follow-up    No orders of the  defined types were placed in this encounter.  The patient has a good understanding of the overall plan. she agrees with it. she will call with any problems that may develop before the next visit here.  Total time spent: 20 mins including face to face time and time spent for planning, charting and coordination of care  Nicholas Lose, MD 07/06/2020  I, Cloyde Reams Dorshimer, am acting as scribe for Dr. Nicholas Lose.  I have reviewed the above documentation for accuracy and completeness, and I agree with the above.

## 2020-07-06 ENCOUNTER — Other Ambulatory Visit: Payer: Self-pay

## 2020-07-06 ENCOUNTER — Inpatient Hospital Stay: Payer: Medicare HMO | Attending: Hematology and Oncology | Admitting: Hematology and Oncology

## 2020-07-06 DIAGNOSIS — Z7982 Long term (current) use of aspirin: Secondary | ICD-10-CM | POA: Diagnosis not present

## 2020-07-06 DIAGNOSIS — Z79810 Long term (current) use of selective estrogen receptor modulators (SERMs): Secondary | ICD-10-CM | POA: Diagnosis not present

## 2020-07-06 DIAGNOSIS — D0511 Intraductal carcinoma in situ of right breast: Secondary | ICD-10-CM

## 2020-07-06 DIAGNOSIS — Z79899 Other long term (current) drug therapy: Secondary | ICD-10-CM | POA: Diagnosis not present

## 2020-07-06 DIAGNOSIS — I10 Essential (primary) hypertension: Secondary | ICD-10-CM

## 2020-07-06 DIAGNOSIS — Z7984 Long term (current) use of oral hypoglycemic drugs: Secondary | ICD-10-CM | POA: Diagnosis not present

## 2020-07-06 DIAGNOSIS — Z923 Personal history of irradiation: Secondary | ICD-10-CM | POA: Diagnosis not present

## 2020-07-06 MED ORDER — CLONIDINE HCL 0.1 MG PO TABS: 0.1000 mg | ORAL_TABLET | Freq: Two times a day (BID) | ORAL | Status: DC

## 2020-07-06 NOTE — Assessment & Plan Note (Signed)
07/04/2019:Routine screening mammogram detected 1.6cm mass in the right breast at the 9 o'clock position, loosely group calcifications in the upper right breast spanning 3.6cm, and no axillary adenopathy. Biopsy showed intermediate grade DCIS, ER 100%, PR 100%. Tix Nx Stage 0  Treatment summary: 1.08/09/2019: Right lumpectomy: DCIS with calcifications, low-grade, 1.4 cm, ALH, margins negative, second lumpectomy: DCIS with calcifications, low-grade, 0.9 cm, ER 100%, PR 100%, Tis NX stage 0 2. Adjuvant radiation therapy will be completed 10/04/2019 3.Adjuvant antiestrogen therapy with tamoxifen x5 years started 09/30/2019  Tamoxifen toxicities:  Breast cancer surveillance: 1.  Breast exam 07/06/2020: Benign 2. Mammogram 06/22/2020: Benign breast density category B  Return to clinic in 1 year for follow-up

## 2020-07-08 ENCOUNTER — Telehealth: Payer: Self-pay | Admitting: Hematology and Oncology

## 2020-07-08 NOTE — Telephone Encounter (Signed)
Scheduled per 8/2 los. Called and left a msg, mailing appt letter and calendar printout

## 2020-07-18 ENCOUNTER — Other Ambulatory Visit: Payer: Self-pay | Admitting: Family Medicine

## 2020-08-04 ENCOUNTER — Other Ambulatory Visit: Payer: Self-pay | Admitting: Family Medicine

## 2020-08-04 DIAGNOSIS — I10 Essential (primary) hypertension: Secondary | ICD-10-CM

## 2020-08-04 NOTE — Telephone Encounter (Signed)
Requested Prescriptions  Pending Prescriptions Disp Refills  . losartan (COZAAR) 100 MG tablet [Pharmacy Med Name: LOSARTAN POTASSIUM 100 MG TAB] 90 tablet 0    Sig: TAKE 1 TABLET BY MOUTH EVERY DAY     Cardiovascular:  Angiotensin Receptor Blockers Failed - 08/04/2020  1:33 AM      Failed - Cr in normal range and within 180 days    Creatinine  Date Value Ref Range Status  07/10/2019 1.24 (H) 0.44 - 1.00 mg/dL Final   Creat  Date Value Ref Range Status  09/22/2016 0.91 0.50 - 0.99 mg/dL Final    Comment:      For patients > or = 74 years of age: The upper reference limit for Creatinine is approximately 13% higher for people identified as African-American.      Creatinine, Ser  Date Value Ref Range Status  05/13/2020 1.07 (H) 0.57 - 1.00 mg/dL Final         Passed - K in normal range and within 180 days    Potassium  Date Value Ref Range Status  05/13/2020 4.4 3.5 - 5.2 mmol/L Final         Passed - Patient is not pregnant      Passed - Last BP in normal range    BP Readings from Last 1 Encounters:  07/06/20 117/62         Passed - Valid encounter within last 6 months    Recent Outpatient Visits          2 months ago Essential hypertension   [facility], Ranell Patrick, MD   2 months ago Vaginal discharge   [facility], Ranell Patrick, MD   5 months ago CKD (chronic kidney disease), stage II   [facility], Ranell Patrick, MD   8 months ago Cellulitis, umbilical   [facility], Ranell Patrick, MD   1 year ago Type 2 diabetes mellitus with microalbuminuria, without long-term current use of insulin [facility])   [facility], Arlie Solomons, MD      Future Appointments            In 3 weeks [name], MD [facility] [place], [facility]

## 2020-08-26 ENCOUNTER — Encounter: Payer: Self-pay | Admitting: Family Medicine

## 2020-08-26 ENCOUNTER — Ambulatory Visit (INDEPENDENT_AMBULATORY_CARE_PROVIDER_SITE_OTHER): Payer: Medicare HMO | Admitting: Family Medicine

## 2020-08-26 ENCOUNTER — Other Ambulatory Visit: Payer: Self-pay

## 2020-08-26 VITALS — BP 144/82 | HR 75 | Temp 97.6°F | Ht 64.0 in | Wt 195.0 lb

## 2020-08-26 DIAGNOSIS — R5383 Other fatigue: Secondary | ICD-10-CM | POA: Diagnosis not present

## 2020-08-26 DIAGNOSIS — R809 Proteinuria, unspecified: Secondary | ICD-10-CM | POA: Diagnosis not present

## 2020-08-26 DIAGNOSIS — E1129 Type 2 diabetes mellitus with other diabetic kidney complication: Secondary | ICD-10-CM | POA: Diagnosis not present

## 2020-08-26 DIAGNOSIS — E782 Mixed hyperlipidemia: Secondary | ICD-10-CM

## 2020-08-26 DIAGNOSIS — R635 Abnormal weight gain: Secondary | ICD-10-CM | POA: Diagnosis not present

## 2020-08-26 DIAGNOSIS — L299 Pruritus, unspecified: Secondary | ICD-10-CM

## 2020-08-26 DIAGNOSIS — I10 Essential (primary) hypertension: Secondary | ICD-10-CM

## 2020-08-26 MED ORDER — TRIAMCINOLONE ACETONIDE 0.1 % EX CREA: 1.0000 "application " | TOPICAL_CREAM | Freq: Two times a day (BID) | CUTANEOUS | 1 refills | Status: DC

## 2020-08-26 NOTE — Progress Notes (Signed)
Subjective:  Patient ID: Susan Miles, female    DOB: [birth year]  Age: 74 y.o. MRN: [id number]  CC:  Chief Complaint  Patient presents with  . Follow-up    on hypertension and diabetes. PT reports no issues with BP since last OV. PT states she checks she normaly checks her BP everyday, but her at home machine broke 2 weeks ago. pt states she is going to get a new one soon. PT reports no issues with her diabetes since last OV. pt checks her BS every morning geting a areading range of 40m/dl-140mg/dl.    HPI Susan Miles for   Diabetes: Complicated by microalbuminuria, CKD.  She is followed by nephrology, Dr. BCarolin SicksBorderline control previously with A1c range of 7.3-7.5.  Treated with Farxiga 10 mg daily, glipizide 20 mg twice daily, Metformin 1000 mg twice daily.  She is on statin and ARB. Home readings range from 86-140.  No symptomatic lows. Microalbumin: Ratio 59 in March 2020. Optho, foot exam, pneumovax: Up-to-date Itching of feet/ankles past week. No burning/tingling. No rash. Cortisone once per day helps temporarily.   Wt Readings from Last 3 Encounters:  08/26/20 195 lb (88.5 kg)  07/06/20 192 lb 9.6 oz (87.4 kg)  05/27/20 189 lb (85.7 kg)  6#weight gain since June.   Lab Results  Component Value Date   HGBA1C 7.5 (H) 05/13/2020   HGBA1C 7.3 (H) 02/12/2020   HGBA1C 7.3 (H) 11/14/2019   Lab Results  Component Value Date   MICROALBUR 7.5 03/23/2016   LDLCALC 63 05/13/2020   CREATININE 1.07 (H) 05/13/2020   Hypertension: Home readings were better at her June visit in the afternoon in the morning, and was tolerating lower dose of clonidine.  She had planned on increasing low intensity exercise at that time, continue same regimen.  Currently amlodipine 10 mg daily, clonidine 0.1 mgTwice daily, losartan 100 mg daily, Toprol-XL 200 mg daily, Home readings: no recent readings, but prior 124/80, 106/86-89. Highest diastolic 91, systolic 1812  Usually  running good.  Elliptical 3-4 times per week for 20-30 min.  BP Readings from Last 3 Encounters:  08/26/20 (!) 144/82  07/06/20 117/62  05/27/20 140/80   Lab Results  Component Value Date   CREATININE 1.07 (H) 05/13/2020     History Patient Active Problem List   Diagnosis Date Noted  . Family history of breast cancer   . Ductal carcinoma in situ (DCIS) of right breast 07/04/2019  . Hyperlipidemia 12/21/2017  . Breast asymmetry 05/01/2015  . Cataract, nuclear 05/24/2012  . HTN (hypertension) 02/10/2012  . DM2 (diabetes mellitus, type 2) ([facility] 02/10/2012   Past Medical History:  Diagnosis Date  . Breast cancer ([facility]   . [facility] ([facility]    right breast cancer  . Diabetes mellitus without complication ([facility]   . Family history of breast cancer   . Hypertension   . Personal history of radiation therapy   . Sleep apnea    does not use CPAP   Past Surgical History:  Procedure Laterality Date  . ABDOMINAL HYSTERECTOMY    . BREAST LUMPECTOMY    . BREAST LUMPECTOMY WITH RADIOACTIVE SEED LOCALIZATION Right 08/09/2019   Procedure: RIGHT BREAST LUMPECTOMY X 2 WITH RADIOACTIVE SEED LOCALIZATION (3 SEEDS);  Surgeon: NAlphonsa Overall MD;  [place] [facility]  Service: General;  Laterality: Right;   Allergies  Allergen Reactions  . Ace Inhibitors Cough   Prior to Admission medications   Medication Sig Start Date End  Date Taking? Authorizing Provider  ACCU-CHEK AVIVA PLUS test strip USE TO TEST BLOOD SUGAR ONCE DAILY. DX E11.9 07/18/20  Yes [name], MD  acetaminophen (TYLENOL) 500 MG tablet Take 500 mg by mouth every 6 (six) hours as needed for mild pain.   Yes [provider]  amLODipine (NORVASC) 10 MG tablet Take 1 tablet (10 mg total) by mouth daily. 05/13/20  Yes [name], MD  aspirin 81 MG tablet Take 81 mg by mouth daily.   Yes [provider]  atorvastatin (LIPITOR) 20 MG tablet Take 1 tablet (20 mg total) by mouth daily at 6 PM.  05/13/20  Yes [name], MD  Blood Glucose Monitoring Suppl (LDR BLOOD GLUCOSE TRUETEST) w/Device KIT 1 Device by Does not apply route daily. Test blood sugar once daily. Dx: E11.9 05/13/20  Yes [name], MD  cetirizine (ZYRTEC) 10 MG tablet Take 1 tablet (10 mg total) by mouth daily. 05/13/20  Yes [name], MD  cloNIDine (CATAPRES) 0.1 MG tablet Take 1 tablet (0.1 mg total) by mouth 2 (two) times daily. TAKE 1 TABLET (0.2 MG TOTAL) BY MOUTH 2 TIMES DAILY 07/06/20  Yes Nicholas Lose, MD  dapagliflozin propanediol (FARXIGA) 10 MG TABS tablet Take 1 tablet (10 mg total) by mouth daily. 05/13/20  Yes [name], MD  doxycycline (VIBRA-TABS) 100 MG tablet Take 1 tablet (100 mg total) by mouth 2 (two) times daily. 05/13/20  Yes [name], MD  fluticasone (FLONASE) 50 MCG/ACT nasal spray Place 1-2 sprays into both nostrils daily. 04/02/18  Yes [name], MD  glipiZIDE (GLUCOTROL) 10 MG tablet TAKE 2 TABLETS (20 MG TOTAL) BY MOUTH 2 (TWO) TIMES DAILY BEFORE A MEAL. 05/13/20  Yes [name], MD  Lancets MISC Test blood sugar once daily. Dx: E11.9 01/24/18  Yes [name], MD  losartan (COZAAR) 100 MG tablet TAKE 1 TABLET BY MOUTH EVERY DAY 08/04/20  Yes [name], MD  metFORMIN (GLUCOPHAGE) 1000 MG tablet Take 1 tablet (1,000 mg total) by mouth 2 (two) times daily with a meal. 05/13/20  Yes [name], MD  metoprolol (TOPROL-XL) 200 MG 24 hr tablet TAKE 1 TABLET (200 MG TOTAL) BY MOUTH DAILY. 05/13/20  Yes [name], MD  metroNIDAZOLE (FLAGYL) 500 MG tablet 1 pill by mouth twice per day.  Avoid any alcohol while taking this medicine. 05/13/20  Yes [name], MD  tamoxifen (NOLVADEX) 20 MG tablet Take 1 tablet (20 mg total) by mouth daily. 10/20/19  Yes Nicholas Lose, MD   Social History   Socioeconomic History  . Marital status: Married    Spouse name: Not on file  . Number of children: Not on file  . Years of education: Not on file    . Highest education level: Not on file  Occupational History  . Not on file  Tobacco Use  . Smoking status: Never Smoker  . Smokeless tobacco: Never Used  Substance and Sexual Activity  . Alcohol use: No  . Drug use: No  . Sexual activity: Not on file  Other Topics Concern  . Not on file  Social History Narrative  . Not on file   Social Determinants of Health   Financial Resource Strain:   . Difficulty of Paying Living Expenses: Not on file  Food Insecurity:   . Worried About Charity fundraiser in the Last Year: Not on file  . Ran Out of Food  in the Last Year: Not on file  Transportation Needs:   . Lack of Transportation (Medical): Not on file  . Lack of Transportation (Non-Medical): Not on file  Physical Activity:   . Days of Exercise per Week: Not on file  . Minutes of Exercise per Session: Not on file  Stress:   . Feeling of Stress : Not on file  Social Connections:   . Frequency of Communication with Friends and Family: Not on file  . Frequency of Social Gatherings with Friends and Family: Not on file  . Attends Religious Services: Not on file  . Active Member of Clubs or Organizations: Not on file  . Attends Archivist Meetings: Not on file  . Marital Status: Not on file  Intimate Partner Violence:   . Fear of Current or Ex-Partner: Not on file  . Emotionally Abused: Not on file  . Physically Abused: Not on file  . Sexually Abused: Not on file    Review of Systems  Constitutional: Positive for fatigue and unexpected weight change (some weight gain, some fatigue at times since radiation for cancer - no new symptoms. ).  Respiratory: Negative for chest tightness and shortness of breath.   Cardiovascular: Negative for chest pain, palpitations and leg swelling.  Gastrointestinal: Negative for abdominal pain and blood in stool.  Neurological: Negative for dizziness, syncope, light-headedness and headaches.     Objective:   Vitals:   08/26/20 1048  08/26/20 1052  BP: (!) 144/81 (!) 144/82  Pulse: 75   Temp: 97.6 F (36.4 C)   TempSrc: Temporal   SpO2: 99%   Weight: 195 lb (88.5 kg)   Height: '5\' 4"'  (1.626 m)      Physical Exam Vitals reviewed.  Constitutional:      Appearance: She is well-developed.  HENT:     Head: Normocephalic and atraumatic.  Eyes:     Conjunctiva/sclera: Conjunctivae normal.     Pupils: Pupils are equal, round, and reactive to light.  Neck:     Vascular: No carotid bruit.  Cardiovascular:     Rate and Rhythm: Normal rate and regular rhythm.     Heart sounds: Normal heart sounds.  Pulmonary:     Effort: Pulmonary effort is normal.     Breath sounds: Normal breath sounds.  Abdominal:     Palpations: Abdomen is soft. There is no pulsatile mass.     Tenderness: There is no abdominal tenderness.  Skin:    General: Skin is warm and dry.     Comments: Lower leg/ankle/feet without rash or specific lesions.  No erythema.  Skin intact.  Neurological:     General: No focal deficit present.     Mental Status: She is alert and oriented to person, place, and time.  Psychiatric:        Mood and Affect: Mood normal.        Behavior: Behavior normal.     Assessment & Plan:  Aemilia Dedrick is a 74 y.o. female . Type 2 diabetes mellitus with microalbuminuria, without long-term current use of insulin (HCC) - Plan: Hemoglobin A1c  -Check A1c, borderline control previously.  Continue diet/low intensity exercise.  Goal A1c less than 7.5 reasonable at her age.  Essential hypertension - Plan: Comprehensive metabolic panel  Borderline control in office, improved home readings.  Close monitoring at home with RTC precautions given, no med changes for now.  Mixed hyperlipidemia - Plan: Comprehensive metabolic panel  -Tolerating current regimen, no changes, labs  as above.  Fatigue, unspecified type - Plan: Comprehensive metabolic panel, CBC, TSH  -Potentially multifactorial, potentially could have been related  to previous radiation.  Check TSH, CBC, CMP, handout given on fatigue, RTC precautions if persistent.  Weight gain - Plan: TSH  Pruritus - Plan: triamcinolone cream (KENALOG) 0.1 %  -No rash seen, temporary triamcinolone prescribed with RTC precautions if persistent need or rash present.  Meds ordered this encounter  Medications  . triamcinolone cream (KENALOG) 0.1 %    Sig: Apply 1 application topically 2 (two) times daily.    Dispense:  30 g    Refill:  1   Patient Instructions   I will check some electrolytes, blood count and thyroid test to evaluate the fatigue further, but if the symptoms are not improving I would recommend following up with myself or with oncology to discuss other work-up.  Monitor your blood pressures at home, and if those readings are over 140 on the top number or over 90 with bottom number, we do need to make some changes.  Let me know.  Can also discuss any possible diabetes changes once I see the A1c today, but keep up the good work with the elliptical and watching your diet.  Triamcinolone cream can be used for the itching on the ankles/feet but I do not see a rash or specific lesions today.  If the symptoms are not improving in the next 1 to 2 weeks at the most, please schedule follow-up visit.  Sooner if worse.   Return to the clinic or go to the nearest emergency room if any of your symptoms worsen or new symptoms occur.     Fatigue If you have fatigue, you feel tired all the time and have a lack of energy or a lack of motivation. Fatigue may make it difficult to start or complete tasks because of exhaustion. In general, occasional or mild fatigue is often a normal response to activity or life. However, long-lasting (chronic) or extreme fatigue may be a symptom of a medical condition. Follow these instructions at home: General instructions  Watch your fatigue for any changes.  Go to bed and get up at the same time every day.  Avoid fatigue by  pacing yourself during the day and getting enough sleep at night.  Maintain a healthy weight. Medicines  Take over-the-counter and prescription medicines only as told by your health care provider.  Take a multivitamin, if told by your health care provider.  Do not use herbal or dietary supplements unless they are approved by your health care provider. Activity   Exercise regularly, as told by your health care provider.  Use or practice techniques to help you relax, such as yoga, tai chi, meditation, or massage therapy. Eating and drinking   Avoid heavy meals in the evening.  Eat a well-balanced diet, which includes lean proteins, whole grains, plenty of fruits and vegetables, and low-fat dairy products.  Avoid consuming too much caffeine.  Avoid the use of alcohol.  Drink enough fluid to keep your urine pale yellow. Lifestyle  Change situations that cause you stress. Try to keep your work and personal schedule in balance.  Do not use any products that contain nicotine or tobacco, such as cigarettes and e-cigarettes. If you need help quitting, ask your health care provider.  Do not use drugs. Contact a health care provider if:  Your fatigue does not get better.  You have a fever.  You suddenly lose or gain weight.  You have headaches.  You have trouble falling asleep or sleeping through the night.  You feel angry, guilty, anxious, or sad.  You are unable to have a bowel movement (constipation).  Your skin is dry.  You have swelling in your legs or another part of your body. Get help right away if:  You feel confused.  Your vision is blurry.  You feel faint or you pass out.  You have a severe headache.  You have severe pain in your abdomen, your back, or the area between your waist and hips (pelvis).  You have chest pain, shortness of breath, or an irregular or fast heartbeat.  You are unable to urinate, or you urinate less than normal.  You have  abnormal bleeding, such as bleeding from the rectum, vagina, nose, lungs, or nipples.  You vomit blood.  You have thoughts about hurting yourself or others. If you ever feel like you may hurt yourself or others, or have thoughts about taking your own life, get help right away. You can go to your nearest emergency department or call:  Your local emergency services (911 in the [place]  A suicide crisis helpline, such as the [place] at 463-769-2010. This is open 24 hours a day. Summary  If you have fatigue, you feel tired all the time and have a lack of energy or a lack of motivation.  Fatigue may make it difficult to start or complete tasks because of exhaustion.  Long-lasting (chronic) or extreme fatigue may be a symptom of a medical condition.  Exercise regularly, as told by your health care provider.  Change situations that cause you stress. Try to keep your work and personal schedule in balance. This information is not intended to replace advice given to you by your health care provider. Make sure you discuss any questions you have with your health care provider. Document Revised: 06/12/2019 Document Reviewed: 08/16/2017 Elsevier Patient Education  [employer].    If you have lab work done today you will be contacted with your lab results within the next 2 weeks.  If you have not heard from [place] then please contact us. The fastest way to get your results is to register for My Chart.   IF you received an x-ray today, you will receive an invoice from [facility]. Please contact [facility] at [phone] with questions or concerns regarding your invoice.   IF you received labwork today, you will receive an invoice from [place]. Please contact [facility] at [phone] with questions or concerns regarding your invoice.   Our billing staff will not be able to assist you with questions regarding bills from these companies.  You  will be contacted with the lab results as soon as they are available. The fastest way to get your results is to activate your My Chart account. Instructions are located on the last page of this paperwork. If you have not heard from [place] regarding the results in 2 weeks, please contact this office.         Signed, [name], MD [facility]

## 2020-08-26 NOTE — Patient Instructions (Addendum)
I will check some electrolytes, blood count and thyroid test to evaluate the fatigue further, but if the symptoms are not improving I would recommend following up with myself or with oncology to discuss other work-up.  Monitor your blood pressures at home, and if those readings are over 140 on the top number or over 90 with bottom number, we do need to make some changes.  Let me know.  Can also discuss any possible diabetes changes once I see the A1c today, but keep up the good work with the elliptical and watching your diet.  Triamcinolone cream can be used for the itching on the ankles/feet but I do not see a rash or specific lesions today.  If the symptoms are not improving in the next 1 to 2 weeks at the most, please schedule follow-up visit.  Sooner if worse.   Return to the clinic or go to the nearest emergency room if any of your symptoms worsen or new symptoms occur.     Fatigue If you have fatigue, you feel tired all the time and have a lack of energy or a lack of motivation. Fatigue may make it difficult to start or complete tasks because of exhaustion. In general, occasional or mild fatigue is often a normal response to activity or life. However, long-lasting (chronic) or extreme fatigue may be a symptom of a medical condition. Follow these instructions at home: General instructions  Watch your fatigue for any changes.  Go to bed and get up at the same time every day.  Avoid fatigue by pacing yourself during the day and getting enough sleep at night.  Maintain a healthy weight. Medicines  Take over-the-counter and prescription medicines only as told by your health care provider.  Take a multivitamin, if told by your health care provider.  Do not use herbal or dietary supplements unless they are approved by your health care provider. Activity   Exercise regularly, as told by your health care provider.  Use or practice techniques to help you relax, such as yoga, tai  chi, meditation, or massage therapy. Eating and drinking   Avoid heavy meals in the evening.  Eat a well-balanced diet, which includes lean proteins, whole grains, plenty of fruits and vegetables, and low-fat dairy products.  Avoid consuming too much caffeine.  Avoid the use of alcohol.  Drink enough fluid to keep your urine pale yellow. Lifestyle  Change situations that cause you stress. Try to keep your work and personal schedule in balance.  Do not use any products that contain nicotine or tobacco, such as cigarettes and e-cigarettes. If you need help quitting, ask your health care provider.  Do not use drugs. Contact a health care provider if:  Your fatigue does not get better.  You have a fever.  You suddenly lose or gain weight.  You have headaches.  You have trouble falling asleep or sleeping through the night.  You feel angry, guilty, anxious, or sad.  You are unable to have a bowel movement (constipation).  Your skin is dry.  You have swelling in your legs or another part of your body. Get help right away if:  You feel confused.  Your vision is blurry.  You feel faint or you pass out.  You have a severe headache.  You have severe pain in your abdomen, your back, or the area between your waist and hips (pelvis).  You have chest pain, shortness of breath, or an irregular or fast heartbeat.  You are  unable to urinate, or you urinate less than normal.  You have abnormal bleeding, such as bleeding from the rectum, vagina, nose, lungs, or nipples.  You vomit blood.  You have thoughts about hurting yourself or others. If you ever feel like you may hurt yourself or others, or have thoughts about taking your own life, get help right away. You can go to your nearest emergency department or call:  Your local emergency services (911 in the [place]  A suicide crisis helpline, such as the [place] at (727)040-0704. This is open 24  hours a day. Summary  If you have fatigue, you feel tired all the time and have a lack of energy or a lack of motivation.  Fatigue may make it difficult to start or complete tasks because of exhaustion.  Long-lasting (chronic) or extreme fatigue may be a symptom of a medical condition.  Exercise regularly, as told by your health care provider.  Change situations that cause you stress. Try to keep your work and personal schedule in balance. This information is not intended to replace advice given to you by your health care provider. Make sure you discuss any questions you have with your health care provider. Document Revised: 06/12/2019 Document Reviewed: 08/16/2017 Elsevier Patient Education  [employer].    If you have lab work done today you will be contacted with your lab results within the next 2 weeks.  If you have not heard from [place] then please contact us. The fastest way to get your results is to register for My Chart.   IF you received an x-ray today, you will receive an invoice from [facility]. Please contact [facility] at [phone] with questions or concerns regarding your invoice.   IF you received labwork today, you will receive an invoice from [place]. Please contact [facility] at [phone] with questions or concerns regarding your invoice.   Our billing staff will not be able to assist you with questions regarding bills from these companies.  You will be contacted with the lab results as soon as they are available. The fastest way to get your results is to activate your My Chart account. Instructions are located on the last page of this paperwork. If you have not heard from [place] regarding the results in 2 weeks, please contact this office.

## 2020-08-27 LAB — COMPREHENSIVE METABOLIC PANEL
ALT: 17 IU/L (ref 0–32)
AST: 14 IU/L (ref 0–40)
Albumin/Globulin Ratio: 1.1 — ABNORMAL LOW (ref 1.2–2.2)
Albumin: 3.7 g/dL (ref 3.7–4.7)
Alkaline Phosphatase: 59 IU/L (ref 44–121)
BUN/Creatinine Ratio: 17 (ref 12–28)
BUN: 19 mg/dL (ref 8–27)
Bilirubin Total: 0.3 mg/dL (ref 0.0–1.2)
CO2: 19 mmol/L — ABNORMAL LOW (ref 20–29)
Calcium: 10.4 mg/dL — ABNORMAL HIGH (ref 8.7–10.3)
Chloride: 106 mmol/L (ref 96–106)
Creatinine, Ser: 1.1 mg/dL — ABNORMAL HIGH (ref 0.57–1.00)
GFR calc Af Amer: 58 mL/min/{1.73_m2} — ABNORMAL LOW (ref >59)
GFR calc non Af Amer: 50 mL/min/{1.73_m2} — ABNORMAL LOW (ref >59)
Globulin, Total: 3.4 g/dL (ref 1.5–4.5)
Glucose: 94 mg/dL (ref 65–99)
Potassium: 4.6 mmol/L (ref 3.5–5.2)
Sodium: 139 mmol/L (ref 134–144)
Total Protein: 7.1 g/dL (ref 6.0–8.5)

## 2020-08-27 LAB — CBC
Hematocrit: 39.9 % (ref 34.0–46.6)
Hemoglobin: 13.1 g/dL (ref 11.1–15.9)
MCH: 28.2 pg (ref 26.6–33.0)
MCHC: 32.8 g/dL (ref 31.5–35.7)
MCV: 86 fL (ref 79–97)
Platelets: 263 10*3/uL (ref 150–450)
RBC: 4.65 x10E6/uL (ref 3.77–5.28)
RDW: 13.1 % (ref 11.7–15.4)
WBC: 5.4 10*3/uL (ref 3.4–10.8)

## 2020-08-27 LAB — HEMOGLOBIN A1C
Est. average glucose Bld gHb Est-mCnc: 160 mg/dL
Hgb A1c MFr Bld: 7.2 % — ABNORMAL HIGH (ref 4.8–5.6)

## 2020-08-27 LAB — TSH: TSH: 3.31 u[IU]/mL (ref 0.450–4.500)

## 2020-09-03 ENCOUNTER — Encounter: Payer: Self-pay | Admitting: Radiology

## 2020-10-01 ENCOUNTER — Other Ambulatory Visit: Payer: Self-pay | Admitting: Hematology and Oncology

## 2020-10-03 ENCOUNTER — Ambulatory Visit: Payer: Medicare HMO | Attending: Internal Medicine

## 2020-10-03 DIAGNOSIS — Z23 Encounter for immunization: Secondary | ICD-10-CM

## 2020-10-03 NOTE — Progress Notes (Signed)
° °  Covid-19 Vaccination Clinic  Name:  Susan Miles    MRN: [id number] DOB: [birth year]  10/03/2020  [name] was observed post Covid-19 immunization for 15 minutes without incident. She was provided with Vaccine Information Sheet and instruction to access the V-Safe system.   [name] was instructed to call 911 with any severe reactions post vaccine:  Difficulty breathing   Swelling of face and throat   A fast heartbeat   A bad rash all over body   Dizziness and weakness

## 2020-10-28 ENCOUNTER — Other Ambulatory Visit: Payer: Self-pay | Admitting: Family Medicine

## 2020-10-28 DIAGNOSIS — E1129 Type 2 diabetes mellitus with other diabetic kidney complication: Secondary | ICD-10-CM

## 2020-10-28 DIAGNOSIS — R809 Proteinuria, unspecified: Secondary | ICD-10-CM

## 2020-10-28 MED ORDER — DAPAGLIFLOZIN PROPANEDIOL 10 MG PO TABS: 10.0000 mg | ORAL_TABLET | Freq: Every day | ORAL | 1 refills | Status: DC

## 2020-10-28 NOTE — Telephone Encounter (Signed)
Requested medication (s) are due for refill today:   Yes  Requested medication (s) are on the active medication list:   Yes  Future visit scheduled:   Yes in 4 wks with Dr. Nyoka Cowden   Last ordered: Requesting a refill however it's not on her formulary and it comes from the [facility].   Requested Prescriptions  Pending Prescriptions Disp Refills   dapagliflozin propanediol (FARXIGA) 10 MG TABS tablet 90 tablet 1    Sig: Take 1 tablet (10 mg total) by mouth daily.      Endocrinology:  Diabetes - SGLT2 Inhibitors Failed - 10/28/2020 11:07 AM      Failed - Cr in normal range and within 360 days    Creatinine  Date Value Ref Range Status  07/10/2019 1.24 (H) 0.44 - 1.00 mg/dL Final   Creat  Date Value Ref Range Status  09/22/2016 0.91 0.50 - 0.99 mg/dL Final    Comment:      For patients > or = 74 years of age: The upper reference limit for Creatinine is approximately 13% higher for people identified as African-American.      Creatinine, Ser  Date Value Ref Range Status  08/26/2020 1.10 (H) 0.57 - 1.00 mg/dL Final          Failed - LDL in normal range and within 360 days    LDL Chol Calc (NIH)  Date Value Ref Range Status  05/13/2020 63 0 - 99 mg/dL Final          Failed - AA eGFR in normal range and within 360 days    GFR, Est African American  Date Value Ref Range Status  09/22/2016 74 >=60 mL/min Final   GFR, Est AFR Am  Date Value Ref Range Status  07/10/2019 50 (L) >60 mL/min Final   GFR calc Af Amer  Date Value Ref Range Status  08/26/2020 58 (L) >59 mL/min/1.73 Final    Comment:    **[facility] currently reports eGFR in compliance with the current**   recommendations of the [employer]. [facility] will   update reporting as new guidelines are published from the NKF-ASN   Task force.    GFR, Est Non African American  Date Value Ref Range Status  09/22/2016 65 >=60 mL/min Final   GFR, Estimated  Date Value Ref Range Status   07/10/2019 43 (L) >60 mL/min Final   GFR calc non Af Amer  Date Value Ref Range Status  08/26/2020 50 (L) >59 mL/min/1.73 Final          Passed - HBA1C is between 0 and 7.9 and within 180 days    Hgb A1c MFr Bld  Date Value Ref Range Status  08/26/2020 7.2 (H) 4.8 - 5.6 % Final    Comment:             Prediabetes: 5.7 - 6.4          Diabetes: >6.4          Glycemic control for adults with diabetes: <7.0           Passed - Valid encounter within last 6 months    Recent Outpatient Visits           2 months ago Type 2 diabetes mellitus with microalbuminuria, without long-term current use of insulin [facility])   [facility], Ranell Patrick, MD   5 months ago Essential hypertension   [facility], Ranell Patrick, MD   5  months ago Vaginal discharge   [facility], Ranell Patrick, MD   8 months ago CKD (chronic kidney disease), stage II   [facility], Ranell Patrick, MD   11 months ago Cellulitis, umbilical   [facility], Ranell Patrick, MD       Future Appointments             In 4 weeks [name], MD [facility] [place], [facility]

## 2020-10-28 NOTE — Telephone Encounter (Signed)
Medication Refill - Medication: Wilder Glade   Has the patient contacted their pharmacy? Yes.   (Agent: If no, request that the patient contact the pharmacy for the refill.) (Agent: If yes, when and what did the pharmacy advise?)  Preferred Pharmacy (with phone number or street name): [place]: Please be advised that RX refills may take up to 3 business days. We ask that you follow-up with your pharmacy.

## 2020-11-06 ENCOUNTER — Other Ambulatory Visit: Payer: Self-pay

## 2020-11-06 ENCOUNTER — Telehealth: Payer: Self-pay

## 2020-11-06 ENCOUNTER — Other Ambulatory Visit: Payer: Self-pay | Admitting: Family Medicine

## 2020-11-06 DIAGNOSIS — I10 Essential (primary) hypertension: Secondary | ICD-10-CM

## 2020-11-06 DIAGNOSIS — J309 Allergic rhinitis, unspecified: Secondary | ICD-10-CM

## 2020-11-06 DIAGNOSIS — R809 Proteinuria, unspecified: Secondary | ICD-10-CM

## 2020-11-06 DIAGNOSIS — E1129 Type 2 diabetes mellitus with other diabetic kidney complication: Secondary | ICD-10-CM

## 2020-11-06 MED ORDER — DAPAGLIFLOZIN PROPANEDIOL 10 MG PO TABS: 10.0000 mg | ORAL_TABLET | Freq: Every day | ORAL | 1 refills | Status: DC

## 2020-11-06 NOTE — Telephone Encounter (Signed)
Completed.

## 2020-11-06 NOTE — Telephone Encounter (Signed)
Spoke with pt about med refill being sent to the wrong pharmacy. She uses a mail order service. [place] Fax number [phone]

## 2020-11-06 NOTE — Telephone Encounter (Signed)
LM pt needs to name medications I have to print and fax Rx as they are not an escrib pharmacy. We have previously [facility] all [facility] some time now

## 2020-11-06 NOTE — Telephone Encounter (Signed)
Susan Miles is calling back with the name of the medications  dapagliflozin propanediol (FARXIGA) 10 MG TABS tablet [[id number]]   [facility]/ [facility] goes to  [facility] [phone]

## 2020-11-06 NOTE — Telephone Encounter (Signed)
Printed and waiting on signature

## 2020-11-25 ENCOUNTER — Ambulatory Visit: Payer: Medicare HMO | Admitting: Family Medicine

## 2020-11-26 ENCOUNTER — Other Ambulatory Visit: Payer: Self-pay | Admitting: Family Medicine

## 2020-11-26 DIAGNOSIS — I10 Essential (primary) hypertension: Secondary | ICD-10-CM

## 2020-12-01 ENCOUNTER — Telehealth: Payer: Self-pay | Admitting: *Deleted

## 2020-12-01 ENCOUNTER — Telehealth: Payer: Self-pay | Admitting: Family Medicine

## 2020-12-01 NOTE — Telephone Encounter (Signed)
Schedule AWV.  

## 2020-12-02 ENCOUNTER — Ambulatory Visit: Payer: Medicare HMO | Admitting: Family Medicine

## 2020-12-04 ENCOUNTER — Other Ambulatory Visit: Payer: Self-pay | Admitting: Family Medicine

## 2020-12-04 DIAGNOSIS — E782 Mixed hyperlipidemia: Secondary | ICD-10-CM

## 2020-12-07 ENCOUNTER — Telehealth: Payer: Self-pay | Admitting: Family Medicine

## 2020-12-08 ENCOUNTER — Telehealth (INDEPENDENT_AMBULATORY_CARE_PROVIDER_SITE_OTHER): Payer: Medicare Other | Admitting: Family Medicine

## 2020-12-08 ENCOUNTER — Other Ambulatory Visit: Payer: Self-pay

## 2020-12-08 ENCOUNTER — Telehealth: Payer: Self-pay

## 2020-12-08 DIAGNOSIS — I10 Essential (primary) hypertension: Secondary | ICD-10-CM | POA: Diagnosis not present

## 2020-12-08 DIAGNOSIS — E1129 Type 2 diabetes mellitus with other diabetic kidney complication: Secondary | ICD-10-CM

## 2020-12-08 DIAGNOSIS — E782 Mixed hyperlipidemia: Secondary | ICD-10-CM | POA: Diagnosis not present

## 2020-12-08 DIAGNOSIS — N3941 Urge incontinence: Secondary | ICD-10-CM | POA: Diagnosis not present

## 2020-12-08 DIAGNOSIS — R809 Proteinuria, unspecified: Secondary | ICD-10-CM

## 2020-12-08 MED ORDER — LOSARTAN POTASSIUM 100 MG PO TABS: 100.0000 mg | ORAL_TABLET | Freq: Every day | ORAL | 1 refills | Status: DC

## 2020-12-08 MED ORDER — AMLODIPINE BESYLATE 10 MG PO TABS: 10.0000 mg | ORAL_TABLET | Freq: Every day | ORAL | 1 refills | Status: DC

## 2020-12-08 MED ORDER — METFORMIN HCL 1000 MG PO TABS: 1000.0000 mg | ORAL_TABLET | Freq: Two times a day (BID) | ORAL | 1 refills | Status: DC

## 2020-12-08 MED ORDER — DAPAGLIFLOZIN PROPANEDIOL 10 MG PO TABS: 10.0000 mg | ORAL_TABLET | Freq: Every day | ORAL | 1 refills | Status: DC

## 2020-12-08 MED ORDER — METOPROLOL SUCCINATE ER 200 MG PO TB24: ORAL_TABLET | ORAL | 1 refills | Status: DC

## 2020-12-08 NOTE — Progress Notes (Signed)
Virtual Visit via Telephone Note  I connected with Susan Miles on 12/08/20 at 5:29 PM by telephone and verified that I am speaking with the correct person using two identifiers. Patient location:home My location: office.   I discussed the limitations, risks, security and privacy concerns of performing an evaluation and management service by telephone and the availability of in person appointments. I also discussed with the patient that there may be a patient responsible charge related to this service. The patient expressed understanding and agreed to proceed, consent obtained  Chief complaint: Chief Complaint  Patient presents with  . Diabetes    Pt reports doing very well checking BG. Pt needs Wilder Glade again. BG this am 150, usually a little lower but noticed difference since running out of her med.  Pt does note she is having trouble holding her urine when she has to go to the rest room starting a few months ago     History of Present Illness:  Diabetes: With microalbuminuria. CKD.  Followed by Dr. Carolin Sicks.  farxiga 59m qd - out recently for past 2 months due to cost. Difficulty getting refills - prior on [employer] program.  Tolerating meds when taking.  Glipizide 276mBID, metformin 100045mID.  Home readings up to 150 recently.  Microalbumin: overdue.  Optho, foot exam, pneumovax: up to date.   Lab Results  Component Value Date   HGBA1C 7.2 (H) 08/26/2020   HGBA1C 7.5 (H) 05/13/2020   HGBA1C 7.3 (H) 02/12/2020   Lab Results  Component Value Date   MICROALBUR 7.5 03/23/2016   LDLCALC 63 05/13/2020   CREATININE 1.10 (H) 08/26/2020   Hypertension: norvasc 39m93m, losartan 100mg63m toprol 200mg 91mclonidine 0.2mg BI76m Home readings: running well at home. 120/71.  BP Readings from Last 3 Encounters:  08/26/20 (!) 144/82  07/06/20 117/62  05/27/20 140/80   Lab Results  Component Value Date   CREATININE 1.10 (H) 08/26/2020   Constitutional: Negative  for fatigue and unexpected weight change.  Eyes: Negative for visual disturbance.  Respiratory: Negative for cough, chest tightness and shortness of breath.   Cardiovascular: Negative for chest pain, palpitations and leg swelling.  Gastrointestinal: Negative for abdominal pain and blood in stool.  Neurological: Negative for dizziness, light-headedness and headaches.    Hyperlipidemia: lipitor 57m qd 11m new side effects.  Lab Results  Component Value Date   CHOL 123 05/13/2020   HDL 42 05/13/2020   LDLCALC 63 05/13/2020   TRIG 91 05/13/2020   CHOLHDL 2.9 05/13/2020   Lab Results  Component Value Date   ALT 17 08/26/2020   AST 14 08/26/2020   ALKPHOS 59 08/26/2020   BILITOT 0.3 08/26/2020   Urinary incontinence: Feels urge - trouble to getting to bathroom in time, with incontinence few times per week.  past 6-9 months. No dysuria.      Patient Active Problem List   Diagnosis Date Noted  . Family history of breast cancer   . Ductal carcinoma in situ (DCIS) of right breast 07/04/2019  . Hyperlipidemia 12/21/2017  . Breast asymmetry 05/01/2015  . Cataract, nuclear 05/24/2012  . HTN (hypertension) 02/10/2012  . DM2 (diabetes mellitus, type 2) (HCC) 03/[place]2013   Past Medical History:  Diagnosis Date  . Breast cancer (HCC)   .[place] [facility]   [facility]  . Diabetes mellitus without complication (HCC)   .[place] history of breast cancer   . Hypertension   . Personal history  of radiation therapy   . Sleep apnea    does not use CPAP   Past Surgical History:  Procedure Laterality Date  . ABDOMINAL HYSTERECTOMY    . BREAST LUMPECTOMY    . BREAST LUMPECTOMY WITH RADIOACTIVE SEED LOCALIZATION Right 08/09/2019   Procedure: RIGHT BREAST LUMPECTOMY X 2 WITH RADIOACTIVE SEED LOCALIZATION (3 SEEDS);  Surgeon: Alphonsa Overall, MD;  Location: [place];  Service: General;  Laterality: Right;   Allergies  Allergen Reactions  . Ace Inhibitors Cough    Prior to Admission medications   Medication Sig Start Date End Date Taking? Authorizing Provider  ACCU-CHEK AVIVA PLUS test strip USE TO TEST BLOOD SUGAR ONCE DAILY. DX E11.9 07/18/20  Yes [name], MD  acetaminophen (TYLENOL) 500 MG tablet Take 500 mg by mouth every 6 (six) hours as needed for mild pain.   Yes [provider]  amLODipine (NORVASC) 10 MG tablet Take 1 tablet (10 mg total) by mouth daily. 05/13/20  Yes [name], MD  aspirin 81 MG tablet Take 81 mg by mouth daily.   Yes [provider]  atorvastatin (LIPITOR) 20 MG tablet TAKE 1 TABLET (20 MG TOTAL) BY MOUTH DAILY AT 6 PM. 12/04/20  Yes [name], MD  Blood Glucose Monitoring Suppl (LDR BLOOD GLUCOSE TRUETEST) w/Device KIT 1 Device by Does not apply route daily. Test blood sugar once daily. Dx: E11.9 05/13/20  Yes [name], MD  cetirizine (ZYRTEC) 10 MG tablet TAKE 1 TABLET BY MOUTH EVERY DAY 11/06/20  Yes [name], MD  cloNIDine (CATAPRES) 0.1 MG tablet Take 1 tablet (0.1 mg total) by mouth 2 (two) times daily. TAKE 1 TABLET (0.2 MG TOTAL) BY MOUTH 2 TIMES DAILY 07/06/20  Yes Nicholas Lose, MD  cloNIDine (CATAPRES) 0.2 MG tablet TAKE 1 TABLET (0.2 MG TOTAL) BY MOUTH 2 (TWO) TIMES DAILY. Patient taking differently: Take 0.2 mg by mouth daily. 11/26/20  Yes [name], MD  dapagliflozin propanediol (FARXIGA) 10 MG TABS tablet Take 1 tablet (10 mg total) by mouth daily. 11/06/20  Yes [name], MD  fluticasone [facility]) 50 MCG/ACT nasal spray Place 1-2 sprays into both nostrils daily. 04/02/18  Yes [name], MD  glipiZIDE (GLUCOTROL) 10 MG tablet TAKE 2 TABLETS (20 MG TOTAL) BY MOUTH 2 (TWO) TIMES DAILY BEFORE A MEAL. 05/13/20  Yes [name], MD  Lancets MISC Test blood sugar once daily. Dx: E11.9 01/24/18  Yes [name], MD  losartan (COZAAR) 100 MG tablet TAKE 1 TABLET BY MOUTH EVERY DAY 08/04/20  Yes [name], MD  metFORMIN  (GLUCOPHAGE) 1000 MG tablet Take 1 tablet (1,000 mg total) by mouth 2 (two) times daily with a meal. 05/13/20  Yes [name], MD  metoprolol (TOPROL-XL) 200 MG 24 hr tablet TAKE 1 TABLET BY MOUTH EVERY DAY 11/06/20  Yes [name], MD  triamcinolone cream (KENALOG) 0.1 % Apply 1 application topically 2 (two) times daily. 08/26/20  Yes [name], MD   Social History   Socioeconomic History  . Marital status: Married    Spouse name: Not on file  . Number of children: Not on file  . Years of education: Not on file  . Highest education level: Not on file  Occupational History  . Not on file  Tobacco Use  . Smoking status: Never Smoker  . Smokeless tobacco: Never Used  Substance and Sexual Activity  . Alcohol use: No  .  Drug use: No  . Sexual activity: Not on file  Other Topics Concern  . Not on file  Social History Narrative  . Not on file   Social Determinants of Health   Financial Resource Strain: Not on file  Food Insecurity: Not on file  Transportation Needs: Not on file  Physical Activity: Not on file  Stress: Not on file  Social Connections: Not on file  Intimate Partner Violence: Not on file     Observations/Objective: There were no vitals filed for this visit. No distress over phone, appropriate responses.  Euthymic mood with discussion.  All questions were answered with understanding of plan expressed.  Assessment and Plan: Type 2 diabetes mellitus with microalbuminuria, without long-term current use of insulin ([place]) - Plan: metFORMIN (GLUCOPHAGE) 1000 MG tablet, Microalbumin / creatinine urine ratio, Hemoglobin A1c, dapagliflozin propanediol (FARXIGA) 10 MG TABS tablet  -Unfortunately has been off [place], will plan to reorder it and check with patient assistance program.  Continue Metformin same dose.  Continue glipizide same dose.  Check A1c, although may be slightly elevated off Farxiga  Essential hypertension - Plan: amLODipine (NORVASC) 10  MG tablet, Comprehensive metabolic panel, losartan (COZAAR) 100 MG tablet, metoprolol (TOPROL-XL) 200 MG 24 hr tablet  -Stable based on home readings, continue same regimen  Mixed hyperlipidemia - Plan: Lipid panel  -Check lipids, continue same dose statin for now.  Urge incontinence  -Handout to be provided, Kegel exercises discussed, option of medication but deferred at this time.  RTC precautions  Follow Up Instructions:     Patient Instructions    See information about incontinence below. If that continues, let me know and we can discuss medications.   I will order meds, and let you know about the lab results after lab visit this week.   Return to the clinic or go to the nearest emergency room if any of your symptoms worsen or new symptoms occur.   Urinary Incontinence  Urinary incontinence refers to a condition in which a person is unable to control where and when to pass urine. A person with this condition will urinate when he or she does not mean to (involuntarily). What are the causes? This condition may be caused by:  Medicines.  Infections.  Constipation.  Overactive bladder muscles.  Weak bladder muscles.  Weak pelvic floor muscles. These muscles provide support for the bladder, intestine, and, in women, the uterus.  Enlarged prostate in men. The prostate is a gland near the bladder. When it gets too big, it can pinch the urethra. With the urethra blocked, the bladder can weaken and lose the ability to empty properly.  Surgery.  Emotional factors, such as anxiety, stress, or post-traumatic stress disorder (PTSD).  Pelvic organ prolapse. This happens in women when organs shift out of place and into the vagina. This shift can prevent the bladder and urethra from working properly. What increases the risk? The following factors may make you more likely to develop this condition:  Older age.  Obesity and physical inactivity.  Pregnancy and  childbirth.  Menopause.  Diseases that affect the nerves or spinal cord (neurological diseases).  Long-term (chronic) coughing. This can increase pressure on the bladder and pelvic floor muscles. What are the signs or symptoms? Symptoms may vary depending on the type of urinary incontinence you have. They include:  A sudden urge to urinate, but passing urine involuntarily before you can get to a bathroom (urge incontinence).  Suddenly passing urine with any activity that forces  urine to pass, such as coughing, laughing, exercise, or sneezing (stress incontinence).  Needing to urinate often, but urinating only a small amount, or constantly dribbling urine (overflow incontinence).  Urinating because you cannot get to the bathroom in time due to a physical disability, such as arthritis or injury, or communication and thinking problems, such as Alzheimer disease (functional incontinence). How is this diagnosed? This condition may be diagnosed based on:  Your medical history.  A physical exam.  Tests, such as: ? Urine tests. ? X-rays of your kidney and bladder. ? Ultrasound. ? CT scan. ? Cystoscopy. In this procedure, a health care provider inserts a tube with a light and camera (cystoscope) through the urethra and into the bladder in order to check for problems. ? Urodynamic testing. These tests assess how well the bladder, urethra, and sphincter can store and release urine. There are different types of urodynamic tests, and they vary depending on what the test is measuring. To help diagnose your condition, your health care provider may recommend that you keep a log of when you urinate and how much you urinate. How is this treated? Treatment for this condition depends on the type of incontinence that you have and its cause. Treatment may include:  Lifestyle changes, such as: ? Quitting smoking. ? Maintaining a healthy weight. ? Staying active. Try to get 150 minutes of  moderate-intensity exercise every week. Ask your health care provider which activities are safe for you. ? Eating a healthy diet.  Avoid high-fat foods, like fried foods.  Avoid refined carbohydrates like white bread and white rice.  Limit how much alcohol and caffeine you drink.  Increase your fiber intake. Foods such as fresh fruits, vegetables, beans, and whole grains are healthy sources of fiber.  Pelvic floor muscle exercises.  Bladder training, such as lengthening the amount of time between bathroom breaks, or using the bathroom at regular intervals.  Using techniques to suppress bladder urges. This can include distraction techniques or controlled breathing exercises.  Medicines to relax the bladder muscles and prevent bladder spasms.  Medicines to help slow or prevent the growth of a man's prostate.  Botox injections. These can help relax the bladder muscles.  Using pulses of electricity to help change bladder reflexes (electrical nerve stimulation).  For women, using a medical device to prevent urine leaks. This is a small, tampon-like, disposable device that is inserted into the urethra.  Injecting collagen or carbon beads (bulking agents) into the urinary sphincter. These can help thicken tissue and close the bladder opening.  Surgery. Follow these instructions at home: Lifestyle  Limit alcohol and caffeine. These can fill your bladder quickly and irritate it.  Keep yourself clean to help prevent odors and skin damage. Ask your doctor about special skin creams and cleansers that can protect the skin from urine.  Consider wearing pads or adult diapers. Make sure to change them regularly, and always change them right after experiencing incontinence. General instructions  Take over-the-counter and prescription medicines only as told by your health care provider.  Use the bathroom about every 3-4 hours, even if you do not feel the need to urinate. Try to empty your  bladder completely every time. After urinating, wait a minute. Then try to urinate again.  Make sure you are in a relaxed position while urinating.  If your incontinence is caused by nerve problems, keep a log of the medicines you take and the times you go to the bathroom.  Keep all follow-up visits  as told by your health care provider. This is important. Contact a health care provider if:  You have pain that gets worse.  Your incontinence gets worse. Get help right away if:  You have a fever or chills.  You are unable to urinate.  You have redness in your groin area or down your legs. Summary  Urinary incontinence refers to a condition in which a person is unable to control where and when to pass urine.  This condition may be caused by medicines, infection, weak bladder muscles, weak pelvic floor muscles, enlargement of the prostate (in men), or surgery.  The following factors increase your risk for developing this condition: older age, obesity, pregnancy and childbirth, menopause, neurological diseases, and chronic coughing.  There are several types of urinary incontinence. They include urge incontinence, stress incontinence, overflow incontinence, and functional incontinence.  This condition is usually treated first with lifestyle and behavioral changes, such as quitting smoking, eating a healthier diet, and doing regular pelvic floor exercises. Other treatment options include medicines, bulking agents, medical devices, electrical nerve stimulation, or surgery. This information is not intended to replace advice given to you by your health care provider. Make sure you discuss any questions you have with your health care provider. Document Revised: 12/01/2017 Document Reviewed: 03/02/2017 Elsevier Patient Education  [employer].      If you have lab work done today you will be contacted with your lab results within the next 2 weeks.  If you have not heard from [place] then  please contact us. The fastest way to get your results is to register for My Chart.   IF you received an x-ray today, you will receive an invoice from [facility]. Please contact [facility] at [phone] with questions or concerns regarding your invoice.   IF you received labwork today, you will receive an invoice from [place]. Please contact [facility] at [phone] with questions or concerns regarding your invoice.   Our billing staff will not be able to assist you with questions regarding bills from these companies.  You will be contacted with the lab results as soon as they are available. The fastest way to get your results is to activate your My Chart account. Instructions are located on the last page of this paperwork. If you have not heard from [place] regarding the results in 2 weeks, please contact this office.       I discussed the assessment and treatment plan with the patient. The patient was provided an opportunity to ask questions and all were answered. The patient agreed with the plan and demonstrated an understanding of the instructions.   The patient was advised to call back or seek an in-person evaluation if the symptoms worsen or if the condition fails to improve as anticipated.  I provided 53mnutes of non-face-to-face time during this encounter.  Signed,   JMerri Ray MD [facility] [place]  12/08/20

## 2020-12-08 NOTE — Patient Instructions (Addendum)
See information about incontinence below. If that continues, let me know and we can discuss medications.   I will order meds, and let you know about the lab results after lab visit this week.   Return to the clinic or go to the nearest emergency room if any of your symptoms worsen or new symptoms occur.   Urinary Incontinence  Urinary incontinence refers to a condition in which a person is unable to control where and when to pass urine. A person with this condition will urinate when he or she does not mean to (involuntarily). What are the causes? This condition may be caused by:  Medicines.  Infections.  Constipation.  Overactive bladder muscles.  Weak bladder muscles.  Weak pelvic floor muscles. These muscles provide support for the bladder, intestine, and, in women, the uterus.  Enlarged prostate in men. The prostate is a gland near the bladder. When it gets too big, it can pinch the urethra. With the urethra blocked, the bladder can weaken and lose the ability to empty properly.  Surgery.  Emotional factors, such as anxiety, stress, or post-traumatic stress disorder (PTSD).  Pelvic organ prolapse. This happens in women when organs shift out of place and into the vagina. This shift can prevent the bladder and urethra from working properly. What increases the risk? The following factors may make you more likely to develop this condition:  Older age.  Obesity and physical inactivity.  Pregnancy and childbirth.  Menopause.  Diseases that affect the nerves or spinal cord (neurological diseases).  Long-term (chronic) coughing. This can increase pressure on the bladder and pelvic floor muscles. What are the signs or symptoms? Symptoms may vary depending on the type of urinary incontinence you have. They include:  A sudden urge to urinate, but passing urine involuntarily before you can get to a bathroom (urge incontinence).  Suddenly passing urine with any activity  that forces urine to pass, such as coughing, laughing, exercise, or sneezing (stress incontinence).  Needing to urinate often, but urinating only a small amount, or constantly dribbling urine (overflow incontinence).  Urinating because you cannot get to the bathroom in time due to a physical disability, such as arthritis or injury, or communication and thinking problems, such as Alzheimer disease (functional incontinence). How is this diagnosed? This condition may be diagnosed based on:  Your medical history.  A physical exam.  Tests, such as: ? Urine tests. ? X-rays of your kidney and bladder. ? Ultrasound. ? CT scan. ? Cystoscopy. In this procedure, a health care provider inserts a tube with a light and camera (cystoscope) through the urethra and into the bladder in order to check for problems. ? Urodynamic testing. These tests assess how well the bladder, urethra, and sphincter can store and release urine. There are different types of urodynamic tests, and they vary depending on what the test is measuring. To help diagnose your condition, your health care provider may recommend that you keep a log of when you urinate and how much you urinate. How is this treated? Treatment for this condition depends on the type of incontinence that you have and its cause. Treatment may include:  Lifestyle changes, such as: ? Quitting smoking. ? Maintaining a healthy weight. ? Staying active. Try to get 150 minutes of moderate-intensity exercise every week. Ask your health care provider which activities are safe for you. ? Eating a healthy diet.  Avoid high-fat foods, like fried foods.  Avoid refined carbohydrates like white bread and white rice.  Limit how much alcohol and caffeine you drink.  Increase your fiber intake. Foods such as fresh fruits, vegetables, beans, and whole grains are healthy sources of fiber.  Pelvic floor muscle exercises.  Bladder training, such as lengthening the  amount of time between bathroom breaks, or using the bathroom at regular intervals.  Using techniques to suppress bladder urges. This can include distraction techniques or controlled breathing exercises.  Medicines to relax the bladder muscles and prevent bladder spasms.  Medicines to help slow or prevent the growth of a man's prostate.  Botox injections. These can help relax the bladder muscles.  Using pulses of electricity to help change bladder reflexes (electrical nerve stimulation).  For women, using a medical device to prevent urine leaks. This is a small, tampon-like, disposable device that is inserted into the urethra.  Injecting collagen or carbon beads (bulking agents) into the urinary sphincter. These can help thicken tissue and close the bladder opening.  Surgery. Follow these instructions at home: Lifestyle  Limit alcohol and caffeine. These can fill your bladder quickly and irritate it.  Keep yourself clean to help prevent odors and skin damage. Ask your doctor about special skin creams and cleansers that can protect the skin from urine.  Consider wearing pads or adult diapers. Make sure to change them regularly, and always change them right after experiencing incontinence. General instructions  Take over-the-counter and prescription medicines only as told by your health care provider.  Use the bathroom about every 3-4 hours, even if you do not feel the need to urinate. Try to empty your bladder completely every time. After urinating, wait a minute. Then try to urinate again.  Make sure you are in a relaxed position while urinating.  If your incontinence is caused by nerve problems, keep a log of the medicines you take and the times you go to the bathroom.  Keep all follow-up visits as told by your health care provider. This is important. Contact a health care provider if:  You have pain that gets worse.  Your incontinence gets worse. Get help right away  if:  You have a fever or chills.  You are unable to urinate.  You have redness in your groin area or down your legs. Summary  Urinary incontinence refers to a condition in which a person is unable to control where and when to pass urine.  This condition may be caused by medicines, infection, weak bladder muscles, weak pelvic floor muscles, enlargement of the prostate (in men), or surgery.  The following factors increase your risk for developing this condition: older age, obesity, pregnancy and childbirth, menopause, neurological diseases, and chronic coughing.  There are several types of urinary incontinence. They include urge incontinence, stress incontinence, overflow incontinence, and functional incontinence.  This condition is usually treated first with lifestyle and behavioral changes, such as quitting smoking, eating a healthier diet, and doing regular pelvic floor exercises. Other treatment options include medicines, bulking agents, medical devices, electrical nerve stimulation, or surgery. This information is not intended to replace advice given to you by your health care provider. Make sure you discuss any questions you have with your health care provider. Document Revised: 12/01/2017 Document Reviewed: 03/02/2017 Elsevier Patient Education  [employer].      If you have lab work done today you will be contacted with your lab results within the next 2 weeks.  If you have not heard from [place] then please contact us. The fastest way to get your results is  to register for My Chart.   IF you received an x-ray today, you will receive an invoice from [facility]. Please contact [facility] at [phone] with questions or concerns regarding your invoice.   IF you received labwork today, you will receive an invoice from [place]. Please contact [facility] at [phone] with questions or concerns regarding your invoice.   Our billing staff will not be able to  assist you with questions regarding bills from these companies.  You will be contacted with the lab results as soon as they are available. The fastest way to get your results is to activate your My Chart account. Instructions are located on the last page of this paperwork. If you have not heard from [place] regarding the results in 2 weeks, please contact this office.

## 2020-12-08 NOTE — Telephone Encounter (Signed)
Pt has been scheduled.  °

## 2020-12-14 ENCOUNTER — Other Ambulatory Visit: Payer: Self-pay | Admitting: Family Medicine

## 2020-12-14 DIAGNOSIS — R809 Proteinuria, unspecified: Secondary | ICD-10-CM

## 2020-12-14 DIAGNOSIS — E1129 Type 2 diabetes mellitus with other diabetic kidney complication: Secondary | ICD-10-CM

## 2020-12-15 ENCOUNTER — Other Ambulatory Visit: Payer: Self-pay

## 2020-12-15 ENCOUNTER — Ambulatory Visit (INDEPENDENT_AMBULATORY_CARE_PROVIDER_SITE_OTHER): Payer: Medicare Other | Admitting: Family Medicine

## 2020-12-15 DIAGNOSIS — E782 Mixed hyperlipidemia: Secondary | ICD-10-CM

## 2020-12-15 DIAGNOSIS — R809 Proteinuria, unspecified: Secondary | ICD-10-CM

## 2020-12-15 DIAGNOSIS — I10 Essential (primary) hypertension: Secondary | ICD-10-CM | POA: Diagnosis not present

## 2020-12-15 DIAGNOSIS — E1129 Type 2 diabetes mellitus with other diabetic kidney complication: Secondary | ICD-10-CM

## 2020-12-16 LAB — LIPID PANEL
Chol/HDL Ratio: 3.2 ratio (ref 0.0–4.4)
Cholesterol, Total: 122 mg/dL (ref 100–199)
HDL: 38 mg/dL — ABNORMAL LOW (ref >39)
LDL Chol Calc (NIH): 69 mg/dL (ref 0–99)
Triglycerides: 73 mg/dL (ref 0–149)
VLDL Cholesterol Cal: 15 mg/dL (ref 5–40)

## 2020-12-16 LAB — MICROALBUMIN / CREATININE URINE RATIO
Creatinine, Urine: 84.7 mg/dL
Microalb/Creat Ratio: 63 mg/g creat — ABNORMAL HIGH (ref 0–29)
Microalbumin, Urine: 53.4 ug/mL

## 2020-12-16 LAB — COMPREHENSIVE METABOLIC PANEL
ALT: 21 IU/L (ref 0–32)
AST: 20 IU/L (ref 0–40)
Albumin/Globulin Ratio: 1.6 (ref 1.2–2.2)
Albumin: 3.9 g/dL (ref 3.7–4.7)
Alkaline Phosphatase: 56 IU/L (ref 44–121)
BUN/Creatinine Ratio: 13 (ref 12–28)
BUN: 14 mg/dL (ref 8–27)
Bilirubin Total: 0.4 mg/dL (ref 0.0–1.2)
CO2: 22 mmol/L (ref 20–29)
Calcium: 10.2 mg/dL (ref 8.7–10.3)
Chloride: 107 mmol/L — ABNORMAL HIGH (ref 96–106)
Creatinine, Ser: 1.04 mg/dL — ABNORMAL HIGH (ref 0.57–1.00)
GFR calc Af Amer: 61 mL/min/{1.73_m2} (ref >59)
GFR calc non Af Amer: 53 mL/min/{1.73_m2} — ABNORMAL LOW (ref >59)
Globulin, Total: 2.5 g/dL (ref 1.5–4.5)
Glucose: 186 mg/dL — ABNORMAL HIGH (ref 65–99)
Potassium: 4.3 mmol/L (ref 3.5–5.2)
Sodium: 140 mmol/L (ref 134–144)
Total Protein: 6.4 g/dL (ref 6.0–8.5)

## 2020-12-16 LAB — HEMOGLOBIN A1C
Est. average glucose Bld gHb Est-mCnc: 203 mg/dL
Hgb A1c MFr Bld: 8.7 % — ABNORMAL HIGH (ref 4.8–5.6)

## 2020-12-17 DIAGNOSIS — N182 Chronic kidney disease, stage 2 (mild): Secondary | ICD-10-CM | POA: Diagnosis not present

## 2020-12-17 DIAGNOSIS — E559 Vitamin D deficiency, unspecified: Secondary | ICD-10-CM | POA: Diagnosis not present

## 2020-12-22 NOTE — Progress Notes (Signed)
Lab letter SENT 

## 2020-12-30 NOTE — Telephone Encounter (Signed)
LVMTCB to rech appt from 12/29 to 12/08/20

## 2020-12-31 DIAGNOSIS — N182 Chronic kidney disease, stage 2 (mild): Secondary | ICD-10-CM | POA: Diagnosis not present

## 2020-12-31 DIAGNOSIS — I129 Hypertensive chronic kidney disease with stage 1 through stage 4 chronic kidney disease, or unspecified chronic kidney disease: Secondary | ICD-10-CM | POA: Diagnosis not present

## 2020-12-31 DIAGNOSIS — E1122 Type 2 diabetes mellitus with diabetic chronic kidney disease: Secondary | ICD-10-CM | POA: Diagnosis not present

## 2020-12-31 DIAGNOSIS — R809 Proteinuria, unspecified: Secondary | ICD-10-CM | POA: Diagnosis not present

## 2021-02-04 ENCOUNTER — Encounter: Payer: Self-pay | Admitting: Family Medicine

## 2021-02-04 ENCOUNTER — Ambulatory Visit (INDEPENDENT_AMBULATORY_CARE_PROVIDER_SITE_OTHER): Payer: Medicare Other | Admitting: Family Medicine

## 2021-02-04 ENCOUNTER — Other Ambulatory Visit: Payer: Self-pay | Admitting: Family Medicine

## 2021-02-04 ENCOUNTER — Other Ambulatory Visit: Payer: Self-pay

## 2021-02-04 VITALS — BP 130/80 | HR 69 | Temp 97.6°F | Ht 64.0 in | Wt 196.0 lb

## 2021-02-04 DIAGNOSIS — R809 Proteinuria, unspecified: Secondary | ICD-10-CM | POA: Diagnosis not present

## 2021-02-04 DIAGNOSIS — E1129 Type 2 diabetes mellitus with other diabetic kidney complication: Secondary | ICD-10-CM

## 2021-02-04 DIAGNOSIS — I10 Essential (primary) hypertension: Secondary | ICD-10-CM

## 2021-02-04 DIAGNOSIS — J309 Allergic rhinitis, unspecified: Secondary | ICD-10-CM

## 2021-02-04 DIAGNOSIS — E782 Mixed hyperlipidemia: Secondary | ICD-10-CM | POA: Diagnosis not present

## 2021-02-04 DIAGNOSIS — L03316 Cellulitis of umbilicus: Secondary | ICD-10-CM

## 2021-02-04 MED ORDER — DOXYCYCLINE HYCLATE 100 MG PO TABS: 100.0000 mg | ORAL_TABLET | Freq: Two times a day (BID) | ORAL | 0 refills | Status: DC

## 2021-02-04 MED ORDER — ATORVASTATIN CALCIUM 20 MG PO TABS
20.0000 mg | ORAL_TABLET | Freq: Every day | ORAL | 1 refills | Status: DC
Start: 2021-02-04 — End: 2021-07-19

## 2021-02-04 MED ORDER — AMLODIPINE BESYLATE 10 MG PO TABS: 10.0000 mg | ORAL_TABLET | Freq: Every day | ORAL | 1 refills | Status: DC

## 2021-02-04 NOTE — Progress Notes (Signed)
Subjective:  Patient ID: Susan Miles, female    DOB: 1946-07-02  Age: 75 y.o. MRN: [id number]  CC:  Chief Complaint  Patient presents with  . Follow-up    On diabetes,hypertension, and hyperlipidemia. Pt reports her BS was elevated a few months ago due to a refill gap in her BS medication. Pt has been able to get a refill and since then the BS has been doing better.Pt reports no issues wit BP since last OV.PT reports no diet changes since last OV.PT reports her current medications seem to work well with no know side effects.  . Drainage    Pt reports drainage coming from her navel. Pt states the provider usually gives her an antibiotic for this. Pt reports the fluid is a milky white and has an odder to it. Pt reports this has been going on for the past month. No known injury/laceration to the area.     HPI Susan Miles presents for   Diabetes: Complicated by microalbuminuria, hyperglycemia.  Had been off her [place] for 2 months due to cost but has since restarted.  Continued on glipizide 20 mg twice daily, metformin 1000 mg twice daily. Home readings: range 112-150, 160. better on faxiga,. No 200's, no Sx lows. No new side effects.   Microalbumin, creatinine ratio 63 on 12/15/2020.  Lab Results  Component Value Date   HGBA1C 8.7 (H) 12/15/2020   HGBA1C 7.2 (H) 08/26/2020   HGBA1C 7.5 (H) 05/13/2020   Lab Results  Component Value Date   MICROALBUR 7.5 03/23/2016   LDLCALC 69 12/15/2020   CREATININE 1.04 (H) 44/31/5400    Umbilical discharge: History of intermittent umbilical cellulitis.  Treated with doxycycline previously.  Treated in December 2020.  Has noticed some intermittent discharge over the past month with slight odor, milky white discharge. No fever/no abd pain. No treatments.      History Patient Active Problem List   Diagnosis Date Noted  . Family history of breast cancer   . Ductal carcinoma in situ (DCIS) of right breast 07/04/2019  .  Hyperlipidemia 12/21/2017  . Breast asymmetry 05/01/2015  . Cataract, nuclear 05/24/2012  . HTN (hypertension) 02/10/2012  . DM2 (diabetes mellitus, type 2) ([place]) 02/10/2012   Past Medical History:  Diagnosis Date  . Breast cancer ([place])   . Cancer [facility])    right breast cancer  . Diabetes mellitus without complication ([place])   . Family history of breast cancer   . Hypertension   . Personal history of radiation therapy   . Sleep apnea    does not use CPAP   Past Surgical History:  Procedure Laterality Date  . ABDOMINAL HYSTERECTOMY    . BREAST LUMPECTOMY    . BREAST LUMPECTOMY WITH RADIOACTIVE SEED LOCALIZATION Right 08/09/2019   Procedure: RIGHT BREAST LUMPECTOMY X 2 WITH RADIOACTIVE SEED LOCALIZATION (3 SEEDS);  Surgeon: Alphonsa Overall, MD;  Location: [facility];  Service: General;  Laterality: Right;   Allergies  Allergen Reactions  . Ace Inhibitors Cough   Prior to Admission medications   Medication Sig Start Date End Date Taking? Authorizing Provider  ACCU-CHEK AVIVA PLUS test strip USE TO TEST BLOOD SUGAR ONCE DAILY. DX E11.9 07/18/20  Yes [name], MD  acetaminophen (TYLENOL) 500 MG tablet Take 500 mg by mouth every 6 (six) hours as needed for mild pain.   Yes [provider]  amLODipine (NORVASC) 10 MG tablet Take 1 tablet (10 mg total) by mouth daily. 12/08/20  Yes [name], MD  aspirin 81 MG tablet Take 81 mg by mouth daily.   Yes [provider]  atorvastatin (LIPITOR) 20 MG tablet TAKE 1 TABLET (20 MG TOTAL) BY MOUTH DAILY AT 6 PM. 12/04/20  Yes [name], MD  Blood Glucose Monitoring Suppl (LDR BLOOD GLUCOSE TRUETEST) w/Device KIT 1 Device by Does not apply route daily. Test blood sugar once daily. Dx: E11.9 05/13/20  Yes [name], MD  cetirizine (ZYRTEC) 10 MG tablet TAKE 1 TABLET BY MOUTH EVERY DAY 02/04/21  Yes [name], MD  cloNIDine (CATAPRES) 0.2 MG tablet TAKE 1 TABLET (0.2 MG TOTAL) BY MOUTH 2  (TWO) TIMES DAILY. Patient taking differently: Take 0.2 mg by mouth daily. 11/26/20  Yes [name], MD  dapagliflozin propanediol (FARXIGA) 10 MG TABS tablet Take 1 tablet (10 mg total) by mouth daily. 12/08/20  Yes [name], MD  fluticasone (FLONASE) 50 MCG/ACT nasal spray Place 1-2 sprays into both nostrils daily. 04/02/18  Yes [name], MD  glipiZIDE (GLUCOTROL) 10 MG tablet TAKE 2 TABLETS (20 MG TOTAL) BY MOUTH 2 (TWO) TIMES DAILY BEFORE A MEAL. 12/14/20  Yes [name], MD  Lancets MISC Test blood sugar once daily. Dx: E11.9 01/24/18  Yes [name], MD  losartan (COZAAR) 100 MG tablet Take 1 tablet (100 mg total) by mouth daily. 12/08/20  Yes [name], MD  metFORMIN (GLUCOPHAGE) 1000 MG tablet Take 1 tablet (1,000 mg total) by mouth 2 (two) times daily with a meal. 12/08/20  Yes [name], MD  metoprolol (TOPROL-XL) 200 MG 24 hr tablet TAKE 1 TABLET BY MOUTH EVERY DAY 12/08/20  Yes [name], MD  triamcinolone cream (KENALOG) 0.1 % Apply 1 application topically 2 (two) times daily. 08/26/20  Yes [name], MD   Social History   Socioeconomic History  . Marital status: Married    Spouse name: Not on file  . Number of children: Not on file  . Years of education: Not on file  . Highest education level: Not on file  Occupational History  . Not on file  Tobacco Use  . Smoking status: Never Smoker  . Smokeless tobacco: Never Used  Substance and Sexual Activity  . Alcohol use: No  . Drug use: No  . Sexual activity: Not on file  Other Topics Concern  . Not on file  Social History Narrative  . Not on file   Social Determinants of Health   Financial Resource Strain: Not on file  Food Insecurity: Not on file  Transportation Needs: Not on file  Physical Activity: Not on file  Stress: Not on file  Social Connections: Not on file  Intimate Partner Violence: Not on file    Review of Systems Per hpi  Objective:    Vitals:   02/04/21 1002 02/04/21 1005  BP: (!) 165/83 130/80  Pulse: 69   Temp: 97.6 F (36.4 C)   TempSrc: Temporal   Weight: 196 lb (88.9 kg)   Height: '5\' 4"'  (1.626 m)      Physical Exam Vitals reviewed.  Constitutional:      Appearance: She is well-developed and well-nourished.  HENT:     Head: Normocephalic and atraumatic.  Eyes:     Extraocular Movements: EOM normal.     Conjunctiva/sclera: Conjunctivae normal.     Pupils: Pupils are equal, round, and reactive to light.  Neck:     Vascular: No carotid  bruit.  Cardiovascular:     Rate and Rhythm: Normal rate and regular rhythm.     Pulses: Intact distal pulses.     Heart sounds: Normal heart sounds.  Pulmonary:     Effort: Pulmonary effort is normal.     Breath sounds: Normal breath sounds.  Abdominal:     Palpations: Abdomen is soft. There is no pulsatile mass.     Tenderness: There is no abdominal tenderness.    Skin:    General: Skin is warm and dry.  Neurological:     Mental Status: She is alert and oriented to person, place, and time.  Psychiatric:        Mood and Affect: Mood and affect normal.        Behavior: Behavior normal.        Assessment & Plan:  Susan Miles is a 75 y.o. female . Cellulitis, umbilical - Plan: doxycycline (VIBRA-TABS) 100 MG tablet  Essential hypertension - Plan: amLODipine (NORVASC) 10 MG tablet  Mixed hyperlipidemia - Plan: atorvastatin (LIPITOR) 20 MG tablet  Improved Home readings with diabetes.  Continue same regimen with [place].  28-monthfollow-up for repeat labs.  Possible early cellulitis, umbilical, intermittent symptoms previously that have resolved with doxycycline, repeat order placed with RTC precautions.   No orders of the defined types were placed in this encounter.  Patient Instructions       If you have lab work done today you will be contacted with your lab results within the next 2 weeks.  If you have not heard from [place] please  contact [place] The fastest way to get your results is to register for My Chart.   IF you received an x-ray today, you will receive an [facility]. Please contact [facility] at [phone] questions or concerns regarding your invoice.   IF you received labwork today, you will receive an invoice from [place] Please contact [facility] at [phone] questions or concerns regarding your invoice.   Our billing staff will not be able to assist you with questions regarding bills from these companies.  You will be contacted with the lab results as soon as they are available. The fastest way to get your results is to activate your My Chart account. Instructions are located on the last page of this paperwork. If you have not heard from [place] the results in 2 weeks, please contact this office.         Signed, JMerri Ray MD [facility]

## 2021-02-04 NOTE — Patient Instructions (Addendum)
continue same meds, recheck in 2 months.   Restart doxycycline for infection of umbilicus.   Return to the clinic or go to the nearest emergency room if any of your symptoms worsen or new symptoms occur.   If you have lab work done today you will be contacted with your lab results within the next 2 weeks.  If you have not heard from [place] then please contact us. The fastest way to get your results is to register for My Chart.   IF you received an x-ray today, you will receive an invoice from [facility]. Please contact [facility] at [phone] with questions or concerns regarding your invoice.   IF you received labwork today, you will receive an invoice from [place]. Please contact [facility] at [phone] with questions or concerns regarding your invoice.   Our billing staff will not be able to assist you with questions regarding bills from these companies.  You will be contacted with the lab results as soon as they are available. The fastest way to get your results is to activate your My Chart account. Instructions are located on the last page of this paperwork. If you have not heard from [place] regarding the results in 2 weeks, please contact this office.

## 2021-02-10 DIAGNOSIS — N182 Chronic kidney disease, stage 2 (mild): Secondary | ICD-10-CM | POA: Diagnosis not present

## 2021-02-10 DIAGNOSIS — N390 Urinary tract infection, site not specified: Secondary | ICD-10-CM | POA: Diagnosis not present

## 2021-02-18 DIAGNOSIS — D0511 Intraductal carcinoma in situ of right breast: Secondary | ICD-10-CM | POA: Diagnosis not present

## 2021-02-25 ENCOUNTER — Other Ambulatory Visit: Payer: Self-pay | Admitting: Family Medicine

## 2021-02-25 DIAGNOSIS — J309 Allergic rhinitis, unspecified: Secondary | ICD-10-CM

## 2021-02-25 NOTE — Telephone Encounter (Signed)
Requested Prescriptions  Pending Prescriptions Disp Refills  . ACCU-CHEK AVIVA PLUS test strip [Pharmacy Med Name: ACCU-CHEK AVIVA PLUS TEST STRP] 100 strip 2    Sig: USE TO TEST BLOOD SUGAR ONCE DAILY. DX E11.9     Endocrinology: Diabetes - Testing Supplies Passed - 02/25/2021  1:41 PM      Passed - Valid encounter within last 12 months    Recent Outpatient Visits          3 weeks ago Cellulitis, umbilical   [facility], Ranell Patrick, MD   2 months ago Type 2 diabetes mellitus with microalbuminuria, without long-term current use of insulin [facility])   [facility], Ranell Patrick, MD   2 months ago Type 2 diabetes mellitus with microalbuminuria, without long-term current use of insulin [facility])   [facility], Ranell Patrick, MD   6 months ago Type 2 diabetes mellitus with microalbuminuria, without long-term current use of insulin [facility])   [facility], Ranell Patrick, MD   9 months ago Essential hypertension   [facility], Ranell Patrick, MD      Future Appointments            In 1 month [name], MD [place] [facility] [place], [facility]           . cetirizine (ZYRTEC) 10 MG tablet [Pharmacy Med Name: CETIRIZINE HCL 10 MG TABLET] 90 tablet 0    Sig: TAKE 1 TABLET BY MOUTH EVERY DAY     Ear, Nose, and Throat:  Antihistamines Passed - 02/25/2021  1:41 PM      Passed - Valid encounter within last 12 months    Recent Outpatient Visits          3 weeks ago Cellulitis, umbilical   [facility], Ranell Patrick, MD   2 months ago Type 2 diabetes mellitus with microalbuminuria, without long-term current use of insulin [facility])   [facility], Ranell Patrick, MD   2 months ago Type 2 diabetes mellitus with microalbuminuria, without long-term current use of insulin [facility])   [facility], Ranell Patrick, MD   6 months ago Type 2 diabetes mellitus with microalbuminuria,  without long-term current use of insulin [facility])   [facility], Ranell Patrick, MD   9 months ago Essential hypertension   [facility], Ranell Patrick, MD      Future Appointments            In 1 month [name], MD [place] [facility] [place], [facility]

## 2021-04-07 ENCOUNTER — Encounter: Payer: Self-pay | Admitting: Family Medicine

## 2021-04-07 ENCOUNTER — Ambulatory Visit (INDEPENDENT_AMBULATORY_CARE_PROVIDER_SITE_OTHER): Payer: Medicare Other | Admitting: Family Medicine

## 2021-04-07 ENCOUNTER — Other Ambulatory Visit: Payer: Self-pay

## 2021-04-07 VITALS — BP 140/82 | HR 69 | Temp 98.1°F | Ht 64.0 in | Wt 198.0 lb

## 2021-04-07 DIAGNOSIS — H5789 Other specified disorders of eye and adnexa: Secondary | ICD-10-CM | POA: Diagnosis not present

## 2021-04-07 DIAGNOSIS — R809 Proteinuria, unspecified: Secondary | ICD-10-CM | POA: Diagnosis not present

## 2021-04-07 DIAGNOSIS — E1129 Type 2 diabetes mellitus with other diabetic kidney complication: Secondary | ICD-10-CM | POA: Diagnosis not present

## 2021-04-07 LAB — HEMOGLOBIN A1C: Hgb A1c MFr Bld: 8.2 % — ABNORMAL HIGH (ref 4.6–6.5)

## 2021-04-07 NOTE — Progress Notes (Addendum)
Subjective:  Patient ID: Susan Miles, female    DOB: 06-Dec-1945  Age: 75 y.o. MRN: [id number]  CC:  Chief Complaint  Patient presents with  . Diabetes    HPI Susan Miles presents for   Diabetes: With microalbuminuria, CKD, followed by nephrologist, Dr. Carolin Sicks. Treated with farxiga, glipizide, metformin.  She had been out of [place] at her January visit due to cost, restarted 10 mg daily.  Continued glipizide to 20 mg twice daily, metformin 1000 mg twice daily. Has been taking meds as rx, no missed doses. No symptomatic lows.  Home readings back on meds. 83-168 fasting.  She is on ARB, statin. Microalbumin: Ratio 63 in January, similar to previous readings. Optho, foot exam, pneumovax: Due for foot exam, ophthalmology exam. Has appt with optho next month.   Lab Results  Component Value Date   HGBA1C 8.7 (H) 12/15/2020   HGBA1C 7.2 (H) 08/26/2020   HGBA1C 7.5 (H) 05/13/2020   Lab Results  Component Value Date   MICROALBUR 7.5 03/23/2016   LDLCALC 69 12/15/2020   CREATININE 1.04 (H) 12/15/2020   Left eye redness: Past 1 week. Irritated with allergies. Tried otc wipes for lids and pataday. Initial discharge has resolved   - none past few days. No visual changes. Seeing ok. Much better overall.   History Patient Active Problem List   Diagnosis Date Noted  . Family history of breast cancer   . Ductal carcinoma in situ (DCIS) of right breast 07/04/2019  . Hyperlipidemia 12/21/2017  . Breast asymmetry 05/01/2015  . Cataract, nuclear 05/24/2012  . HTN (hypertension) 02/10/2012  . DM2 (diabetes mellitus, type 2) ([place]) 02/10/2012   Past Medical History:  Diagnosis Date  . Breast cancer ([place])   . Cancer [facility])    right breast cancer  . Diabetes mellitus without complication ([place])   . Family history of breast cancer   . Hypertension   . Personal history of radiation therapy   . Sleep apnea    does not use CPAP   Past Surgical History:  Procedure  Laterality Date  . ABDOMINAL HYSTERECTOMY    . BREAST LUMPECTOMY    . BREAST LUMPECTOMY WITH RADIOACTIVE SEED LOCALIZATION Right 08/09/2019   Procedure: RIGHT BREAST LUMPECTOMY X 2 WITH RADIOACTIVE SEED LOCALIZATION (3 SEEDS);  Surgeon: Alphonsa Overall, MD;  Location: [place];  Service: General;  Laterality: Right;   Allergies  Allergen Reactions  . Ace Inhibitors Cough   Prior to Admission medications   Medication Sig Start Date End Date Taking? Authorizing Provider  ACCU-CHEK AVIVA PLUS test strip USE TO TEST BLOOD SUGAR ONCE DAILY. DX E11.9 02/25/21  Yes [name], MD  acetaminophen (TYLENOL) 500 MG tablet Take 500 mg by mouth every 6 (six) hours as needed for mild pain.   Yes [provider]  amLODipine (NORVASC) 10 MG tablet Take 1 tablet (10 mg total) by mouth daily. 02/04/21  Yes [name], MD  aspirin 81 MG tablet Take 81 mg by mouth daily.   Yes [provider]  atorvastatin (LIPITOR) 20 MG tablet Take 1 tablet (20 mg total) by mouth daily at 6 PM. 02/04/21  Yes [name], MD  Blood Glucose Monitoring Suppl (LDR BLOOD GLUCOSE TRUETEST) w/Device KIT 1 Device by Does not apply route daily. Test blood sugar once daily. Dx: E11.9 05/13/20  Yes [name], MD  cetirizine (ZYRTEC) 10 MG tablet TAKE 1 TABLET BY MOUTH EVERY DAY 02/04/21  Yes  [name], MD  cloNIDine (CATAPRES) 0.2 MG tablet TAKE 1 TABLET (0.2 MG TOTAL) BY MOUTH 2 (TWO) TIMES DAILY. Patient taking differently: Take 0.2 mg by mouth daily. 11/26/20  Yes [name], MD  dapagliflozin propanediol (FARXIGA) 10 MG TABS tablet Take 1 tablet (10 mg total) by mouth daily. 12/08/20  Yes [name], MD  fluticasone (FLONASE) 50 MCG/ACT nasal spray Place 1-2 sprays into both nostrils daily. 04/02/18  Yes [name], MD  glipiZIDE (GLUCOTROL) 10 MG tablet TAKE 2 TABLETS (20 MG TOTAL) BY MOUTH 2 (TWO) TIMES DAILY BEFORE A MEAL. 12/14/20  Yes [name], MD   Lancets MISC Test blood sugar once daily. Dx: E11.9 01/24/18  Yes [name], MD  losartan (COZAAR) 100 MG tablet Take 1 tablet (100 mg total) by mouth daily. 12/08/20  Yes [name], MD  metFORMIN (GLUCOPHAGE) 1000 MG tablet Take 1 tablet (1,000 mg total) by mouth 2 (two) times daily with a meal. 12/08/20  Yes [name], MD  metoprolol (TOPROL-XL) 200 MG 24 hr tablet TAKE 1 TABLET BY MOUTH EVERY DAY 12/08/20  Yes [name], MD  triamcinolone cream (KENALOG) 0.1 % Apply 1 application topically 2 (two) times daily. 08/26/20  Yes [name], MD   Social History   Socioeconomic History  . Marital status: Married    Spouse name: Not on file  . Number of children: Not on file  . Years of education: Not on file  . Highest education level: Not on file  Occupational History  . Not on file  Tobacco Use  . Smoking status: Never Smoker  . Smokeless tobacco: Never Used  Substance and Sexual Activity  . Alcohol use: No  . Drug use: No  . Sexual activity: Not on file  Other Topics Concern  . Not on file  Social History Narrative  . Not on file   Social Determinants of Health   Financial Resource Strain: Not on file  Food Insecurity: Not on file  Transportation Needs: Not on file  Physical Activity: Not on file  Stress: Not on file  Social Connections: Not on file  Intimate Partner Violence: Not on file    Review of Systems  Constitutional: Negative for fatigue and unexpected weight change.  Eyes: Positive for discharge (initially only. ) and redness. Negative for photophobia and visual disturbance.  Respiratory: Negative for chest tightness and shortness of breath.   Cardiovascular: Negative for chest pain, palpitations and leg swelling.  Gastrointestinal: Negative for abdominal pain and blood in stool.  Neurological: Negative for dizziness, syncope, light-headedness and headaches.    Objective:   Vitals:   04/07/21 1023  BP: 140/82  Pulse: 69   Temp: 98.1 F (36.7 C)  SpO2: 99%  Weight: 198 lb (89.8 kg)  Height: '5\' 4"'  (1.626 m)   BP Readings from Last 3 Encounters:  04/07/21 140/82  02/04/21 130/80  08/26/20 (!) 144/82    Physical Exam Vitals reviewed.  Constitutional:      Appearance: She is well-developed.  HENT:     Head: Normocephalic and atraumatic.  Eyes:     Conjunctiva/sclera:     Left eye: Left conjunctiva is injected.     Pupils: Pupils are equal, round, and reactive to light.     Comments: Slight scleral injection on left, no exudate at canthi, PERRL, EOMI, anterior chamber clear.  No appreciable soft tissue swelling around the eye or rash.  Neck:  Vascular: No carotid bruit.  Cardiovascular:     Rate and Rhythm: Normal rate and regular rhythm.     Heart sounds: Normal heart sounds.  Pulmonary:     Effort: Pulmonary effort is normal.     Breath sounds: Normal breath sounds.  Abdominal:     Palpations: Abdomen is soft. There is no pulsatile mass.     Tenderness: There is no abdominal tenderness.  Musculoskeletal:     Right lower leg: No edema.     Left lower leg: No edema.  Skin:    General: Skin is warm and dry.  Neurological:     Mental Status: She is alert and oriented to person, place, and time.  Psychiatric:        Behavior: Behavior normal.     Diabetic Foot Exam - Simple   No data filed      Assessment & Plan:  Susan Miles is a 75 y.o. female . Type 2 diabetes mellitus with microalbuminuria, without long-term current use of insulin (HCC) - Plan: Hemoglobin A1c, HM Diabetes Foot Exam  -Overall improved home readings, occasional elevated reading may be due to diet but with 80s and 90s would defer med changes at this time.  Check A1c.  Redness of left eye  -Possible conjunctivitis/viral conjunctivitis versus allergic cause as improved with Pataday.  RTC precautions if any worsening or not continuing to improve.  No orders of the defined types were placed in this  encounter.  Patient Instructions   I am glad to hear that your eye is improving.  If any return of discharge or worsening redness please be seen.  It is possible that could have been from allergies or possible virus but no new treatments as improving at this time.  I suspect diabetes test to be better controlled at this time. No med changes.   Type 2 Diabetes Mellitus, Self-Care, Adult Caring for yourself after you have been diagnosed with type 2 diabetes (type 2 diabetes mellitus) means keeping your blood sugar (glucose) under control with a balance of:  Nutrition.  Exercise.  Lifestyle changes.  Medicines or insulin, if needed.  Support from your team of health care providers and others. The following information explains what you need to know to manage your diabetes at home. What are the risks? Having diabetes can put you at risk for other long-term (chronic) conditions, such as heart disease and kidney disease. Your health care provider may prescribe medicines to help prevent complications from diabetes. How to monitor blood glucose  Check your blood glucose every day, as often as told by your health care provider.  Have your A1C (hemoglobin A1C) level checked two or more times a year, or as often as told by your health care provider.  Your health care provider will set personalized treatment goals for you. Generally, the goal of treatment is to maintain the following blood glucose levels: ? Before meals: 80-130 mg/dL (4.4-7.2 mmol/L). ? After meals: below 180 mg/dL (10 mmol/L). ? A1C level: less than 7%.   How to manage hyperglycemia and hypoglycemia Hyperglycemia symptoms Hyperglycemia, also called high blood glucose, occurs when blood glucose is too high. Make sure you know the early signs of hyperglycemia, such as:  Increased thirst.  Hunger.  Feeling very tired.  Needing to urinate more often than usual.  Blurry vision. Hypoglycemia symptoms Hypoglycemia, also  called low blood glucose, occurs with a blood glucose level at or below 70 mg/dL (3.9 mmol/L). Diabetes medicines lower your  blood glucose and can cause hypoglycemia. The risk for hypoglycemia increases during or after exercise, during sleep, during illness, and when skipping meals or not eating for a long time (fasting). It is important to know the symptoms of hypoglycemia and treat it right away. Always have a 15-gram rapid-acting carbohydrate snack with you to treat low blood glucose. Family members and close friends should also know the symptoms and understand how to treat hypoglycemia, in case you are not able to treat yourself. Symptoms may include:  Hunger.  Anxiety.  Sweating and feeling clammy.  Dizziness or feeling light-headed.  Sleepiness.  Increased heart rate.  Irritability.  Tingling or numbness around the mouth, lips, or tongue.  Restless sleep. Severe hypoglycemia is when your blood glucose level is at or below 54 mg/dL (3 mmol/L). Severe hypoglycemia is an emergency. Do not wait to see if the symptoms will go away. Get medical help right away. Call your local emergency services (911 in the [place] Do not drive yourself to the hospital. If you have severe hypoglycemia and you cannot eat or drink, you may need glucagon. A family member or close friend should learn how to check your blood glucose and how to give you glucagon. Ask your health care provider if you need to have an emergency glucagon kit available. Follow these instructions at home: Medicines  Take diabetes medicines as told by your health care provider. If your health care provider prescribed insulin or diabetes medicines, take them every day.  Do not run out of insulin or other diabetes medicines. Plan ahead so you always have these available.  If you use insulin, adjust your dosage based on your physical activity and what foods you eat. Your health care provider will tell you how to adjust your  dosage.  Take over-the-counter and prescription medicines only as told by your health care provider. Eating and drinking What you eat and drink affects your blood glucose and your insulin dosage. Making good choices helps to control your diabetes and prevent other health problems. A healthy meal plan includes eating lean proteins, complex carbohydrates, fresh fruits and vegetables, low-fat dairy products, and healthy fats. Make an appointment to see a registered dietitian to help you create an eating plan that is right for you. Make sure that you:  Follow instructions from your health care provider about eating or drinking restrictions.  Drink enough fluid to keep your urine pale yellow.  Keep a record of the carbohydrates that you eat. Do this by reading food labels and learning the standard serving sizes of foods.  Follow your sick-day plan whenever you cannot eat or drink as usual. Make this plan in advance with your health care provider.   Activity  Stay active. Exercise regularly, as told by your health care provider. This may include: ? Stretching and doing strength exercises, such as yoga or weight lifting, 2 or more times a week. ? Doing 150 minutes or more of moderate-intensity or vigorous-intensity exercise each week. This could be brisk walking, biking, or water aerobics.  Spread out your activity over 3 or more days of the week.  Do not go more than 2 days in a row without doing some kind of physical activity.  When you start a new exercise or activity, work with your health care provider to adjust your insulin, medicines, or food intake as needed. Lifestyle  Do not use any products that contain nicotine or tobacco, such as cigarettes, e-cigarettes, and chewing tobacco. If you need help  quitting, ask your health care provider.  If your health care provider says that alcohol is safe for you, limit how much you use to no more than 1 drink a day for women who are not pregnant  and 2 drinks a day for men. In the [place] one drink equals one 12 oz bottle of beer (355 mL), one 5 oz glass of wine (148 mL), or one 1 oz glass of hard liquor (44 mL).  Learn to manage stress. If you need help with this, ask your health care provider. Take care of your body  Keep your immunizations up to date. In addition to getting vaccinations as told by your health care provider, it is recommended that you get vaccinated against the following illnesses: ? The flu (influenza). Get a flu shot every year. ? Pneumonia. ? Hepatitis B.  Schedule an eye exam soon after your diagnosis, and then one time every year after that.  Check your skin and feet every day for cuts, bruises, redness, blisters, or sores. Schedule a foot exam with your health care provider once every year.  Brush your teeth and gums two times a day, and floss one or more times a day. Visit your dentist one or more times every 6 months.  Maintain a healthy weight.   General instructions  Share your diabetes management plan with people in your workplace, school, and household.  Carry a medical alert card or wear medical alert jewelry.  Keep all follow-up visits as told by your health care provider. This is important. Questions to ask your health care provider  Should I meet with a certified diabetes care and education specialist?  Where can I find a support group for people with diabetes? Where to find more information  American Diabetes Association (ADA): www.diabetes.org  American Association of Diabetes Care and Education Specialists (ADCES): www.diabeteseducator.org  International Diabetes Federation (IDF): MemberVerification.ca Summary  Caring for yourself after you have been diagnosed with type 2 diabetes (type 2 diabetes mellitus) means keeping your blood sugar (glucose) under control with a balance of nutrition, exercise, lifestyle changes, and medicine.  Check your blood glucose every day, as often as told by your  health care provider.  Having diabetes can put you at risk for other long-term (chronic) conditions, such as heart disease and kidney disease. Your health care provider may prescribe medicines to help prevent complications from diabetes.  Share your diabetes management plan with people in your workplace, school, and household.  Keep all follow-up visits as told by your health care provider. This is important. This information is not intended to replace advice given to you by your health care provider. Make sure you discuss any questions you have with your health care provider. Document Revised: 12/30/2019 Document Reviewed: 12/31/2019 Elsevier Patient Education  2021 [place].      Signed, [name], MD [facility]

## 2021-04-07 NOTE — Patient Instructions (Addendum)
I am glad to hear that your eye is improving.  If any return of discharge or worsening redness please be seen.  It is possible that could have been from allergies or possible virus but no new treatments as improving at this time.  I suspect diabetes test to be better controlled at this time. No med changes.   Type 2 Diabetes Mellitus, Self-Care, Adult Caring for yourself after you have been diagnosed with type 2 diabetes (type 2 diabetes mellitus) means keeping your blood sugar (glucose) under control with a balance of:  Nutrition.  Exercise.  Lifestyle changes.  Medicines or insulin, if needed.  Support from your team of health care providers and others. The following information explains what you need to know to manage your diabetes at home. What are the risks? Having diabetes can put you at risk for other long-term (chronic) conditions, such as heart disease and kidney disease. Your health care provider may prescribe medicines to help prevent complications from diabetes. How to monitor blood glucose  Check your blood glucose every day, as often as told by your health care provider.  Have your A1C (hemoglobin A1C) level checked two or more times a year, or as often as told by your health care provider.  Your health care provider will set personalized treatment goals for you. Generally, the goal of treatment is to maintain the following blood glucose levels: ? Before meals: 80-130 mg/dL (4.4-7.2 mmol/L). ? After meals: below 180 mg/dL (10 mmol/L). ? A1C level: less than 7%.   How to manage hyperglycemia and hypoglycemia Hyperglycemia symptoms Hyperglycemia, also called high blood glucose, occurs when blood glucose is too high. Make sure you know the early signs of hyperglycemia, such as:  Increased thirst.  Hunger.  Feeling very tired.  Needing to urinate more often than usual.  Blurry vision. Hypoglycemia symptoms Hypoglycemia, also called low blood glucose, occurs with a  blood glucose level at or below 70 mg/dL (3.9 mmol/L). Diabetes medicines lower your blood glucose and can cause hypoglycemia. The risk for hypoglycemia increases during or after exercise, during sleep, during illness, and when skipping meals or not eating for a long time (fasting). It is important to know the symptoms of hypoglycemia and treat it right away. Always have a 15-gram rapid-acting carbohydrate snack with you to treat low blood glucose. Family members and close friends should also know the symptoms and understand how to treat hypoglycemia, in case you are not able to treat yourself. Symptoms may include:  Hunger.  Anxiety.  Sweating and feeling clammy.  Dizziness or feeling light-headed.  Sleepiness.  Increased heart rate.  Irritability.  Tingling or numbness around the mouth, lips, or tongue.  Restless sleep. Severe hypoglycemia is when your blood glucose level is at or below 54 mg/dL (3 mmol/L). Severe hypoglycemia is an emergency. Do not wait to see if the symptoms will go away. Get medical help right away. Call your local emergency services (911 in the [place] Do not drive yourself to the hospital. If you have severe hypoglycemia and you cannot eat or drink, you may need glucagon. A family member or close friend should learn how to check your blood glucose and how to give you glucagon. Ask your health care provider if you need to have an emergency glucagon kit available. Follow these instructions at home: Medicines  Take diabetes medicines as told by your health care provider. If your health care provider prescribed insulin or diabetes medicines, take them every day.  Do not  run out of insulin or other diabetes medicines. Plan ahead so you always have these available.  If you use insulin, adjust your dosage based on your physical activity and what foods you eat. Your health care provider will tell you how to adjust your dosage.  Take over-the-counter and prescription  medicines only as told by your health care provider. Eating and drinking What you eat and drink affects your blood glucose and your insulin dosage. Making good choices helps to control your diabetes and prevent other health problems. A healthy meal plan includes eating lean proteins, complex carbohydrates, fresh fruits and vegetables, low-fat dairy products, and healthy fats. Make an appointment to see a registered dietitian to help you create an eating plan that is right for you. Make sure that you:  Follow instructions from your health care provider about eating or drinking restrictions.  Drink enough fluid to keep your urine pale yellow.  Keep a record of the carbohydrates that you eat. Do this by reading food labels and learning the standard serving sizes of foods.  Follow your sick-day plan whenever you cannot eat or drink as usual. Make this plan in advance with your health care provider.   Activity  Stay active. Exercise regularly, as told by your health care provider. This may include: ? Stretching and doing strength exercises, such as yoga or weight lifting, 2 or more times a week. ? Doing 150 minutes or more of moderate-intensity or vigorous-intensity exercise each week. This could be brisk walking, biking, or water aerobics.  Spread out your activity over 3 or more days of the week.  Do not go more than 2 days in a row without doing some kind of physical activity.  When you start a new exercise or activity, work with your health care provider to adjust your insulin, medicines, or food intake as needed. Lifestyle  Do not use any products that contain nicotine or tobacco, such as cigarettes, e-cigarettes, and chewing tobacco. If you need help quitting, ask your health care provider.  If your health care provider says that alcohol is safe for you, limit how much you use to no more than 1 drink a day for women who are not pregnant and 2 drinks a day for men. In the [place] one drink  equals one 12 oz bottle of beer (355 mL), one 5 oz glass of wine (148 mL), or one 1 oz glass of hard liquor (44 mL).  Learn to manage stress. If you need help with this, ask your health care provider. Take care of your body  Keep your immunizations up to date. In addition to getting vaccinations as told by your health care provider, it is recommended that you get vaccinated against the following illnesses: ? The flu (influenza). Get a flu shot every year. ? Pneumonia. ? Hepatitis B.  Schedule an eye exam soon after your diagnosis, and then one time every year after that.  Check your skin and feet every day for cuts, bruises, redness, blisters, or sores. Schedule a foot exam with your health care provider once every year.  Brush your teeth and gums two times a day, and floss one or more times a day. Visit your dentist one or more times every 6 months.  Maintain a healthy weight.   General instructions  Share your diabetes management plan with people in your workplace, school, and household.  Carry a medical alert card or wear medical alert jewelry.  Keep all follow-up visits as told by your  health care provider. This is important. Questions to ask your health care provider  Should I meet with a certified diabetes care and education specialist?  Where can I find a support group for people with diabetes? Where to find more information  American Diabetes Association (ADA): www.diabetes.org  American Association of Diabetes Care and Education Specialists (ADCES): www.diabeteseducator.org  International Diabetes Federation (IDF): MemberVerification.ca Summary  Caring for yourself after you have been diagnosed with type 2 diabetes (type 2 diabetes mellitus) means keeping your blood sugar (glucose) under control with a balance of nutrition, exercise, lifestyle changes, and medicine.  Check your blood glucose every day, as often as told by your health care provider.  Having diabetes can put  you at risk for other long-term (chronic) conditions, such as heart disease and kidney disease. Your health care provider may prescribe medicines to help prevent complications from diabetes.  Share your diabetes management plan with people in your workplace, school, and household.  Keep all follow-up visits as told by your health care provider. This is important. This information is not intended to replace advice given to you by your health care provider. Make sure you discuss any questions you have with your health care provider. Document Revised: 12/30/2019 Document Reviewed: 12/31/2019 Elsevier Patient Education  2021 [employer].

## 2021-05-19 ENCOUNTER — Other Ambulatory Visit: Payer: Self-pay | Admitting: Family Medicine

## 2021-05-19 DIAGNOSIS — Z9889 Other specified postprocedural states: Secondary | ICD-10-CM

## 2021-05-22 ENCOUNTER — Other Ambulatory Visit: Payer: Self-pay | Admitting: Family Medicine

## 2021-05-22 DIAGNOSIS — I10 Essential (primary) hypertension: Secondary | ICD-10-CM

## 2021-06-06 ENCOUNTER — Other Ambulatory Visit: Payer: Self-pay | Admitting: Family Medicine

## 2021-06-06 DIAGNOSIS — I10 Essential (primary) hypertension: Secondary | ICD-10-CM

## 2021-06-06 DIAGNOSIS — R809 Proteinuria, unspecified: Secondary | ICD-10-CM

## 2021-06-18 ENCOUNTER — Other Ambulatory Visit: Payer: Self-pay | Admitting: Adult Health

## 2021-06-18 DIAGNOSIS — Z9889 Other specified postprocedural states: Secondary | ICD-10-CM

## 2021-06-23 ENCOUNTER — Ambulatory Visit
Admission: RE | Admit: 2021-06-23 | Discharge: 2021-06-23 | Disposition: A | Discharge status: Home / Self Care | Payer: Medicare Other | Source: Ambulatory Visit | Attending: Family Medicine | Admitting: Family Medicine

## 2021-06-23 ENCOUNTER — Other Ambulatory Visit: Payer: Self-pay

## 2021-06-23 DIAGNOSIS — Z9889 Other specified postprocedural states: Secondary | ICD-10-CM

## 2021-07-05 NOTE — Assessment & Plan Note (Signed)
07/04/2019:Routine screening mammogram detected 1.6cm mass in the right breast at the 9 o'clock position, loosely group calcifications in the upper right breast spanning 3.6cm, and no axillary adenopathy. Biopsy showed intermediate grade DCIS, ER 100%, PR 100%. Tix Nx Stage 0  Treatment summary: 1.08/09/2019: Right lumpectomy: DCIS with calcifications, low-grade, 1.4 cm, ALH, margins negative, second lumpectomy: DCIS with calcifications, low-grade, 0.9 cm, ER 100%, PR 100%, Tis NX stage 0 2. Adjuvant radiation therapywill be completed 10/04/2019 3.Adjuvant antiestrogen therapy with tamoxifen x5 years started 09/30/2019  Tamoxifen toxicities:  Breast cancer surveillance: 1.  Breast exam 07/06/2021: Benign 2. Mammogram 06/23/2021: Benign breast density category B  Return to clinic in 1 year for follow-up

## 2021-07-05 NOTE — Progress Notes (Signed)
Patient Care Team: [name], MD as PCP - General (Family Medicine) Webb Laws, [place] as Referring Physician (Optometry) Nicholas Lose, MD as Consulting Physician (Hematology and Oncology) Kyung Rudd, MD as Consulting Physician (Radiation Oncology) Alphonsa Overall, MD as Consulting Physician (General Surgery)  DIAGNOSIS:    ICD-10-CM   1. Ductal carcinoma in situ (DCIS) of right breast  D05.11       SUMMARY OF ONCOLOGIC HISTORY: Oncology History  Ductal carcinoma in situ (DCIS) of right breast  07/04/2019 Initial Diagnosis   Routine screening mammogram detected 1.6cm mass in the right breast at the 9 o'clock position, loosely group calcifications in the upper right breast spanning 3.6cm, and no axillary adenopathy. Biopsy showed intermediate grade DCIS, ER 100%, PR 100%.    07/10/2019 Cancer Staging   Staging form: Breast, AJCC 8th Edition - Clinical stage from 07/10/2019: Stage 0 (cTis (DCIS), cN0, cM0, ER+, PR+, HER2: Not Assessed) - Signed by Nicholas Lose, MD on 07/10/2019    08/09/2019 Surgery   Right lumpectomy Lucia Gaskins) [phone]): multifocal low grade DCIS spanning 1.4cm and 0.9cm, clear margins.    09/09/2019 - 10/04/2019 Radiation Therapy   The patient initially received a dose of 42.56 Gy in 16 fractions to the breast using whole-breast tangent fields. This was delivered using a 3-D conformal technique. The patient then received a boost to the seroma. This delivered an additional 8 Gy in 25factions using a 3 field photon technique due to the depth of the seroma. The total dose was 50.56 Gy.   10/2019 - 10/2024 Anti-estrogen oral therapy   Tamoxifen daily     CHIEF COMPLIANT: Follow-up of right breast DCIS on tamoxifen  INTERVAL HISTORY: Susan Miles a 75y.o. with above-mentioned history of right breast DCIS who underwent a lumpectomy, radiation, and is currently on anti-estrogen therapy with tamoxifen. Mammogram on 06/23/21 showed no evidence of  malignancy bilaterally. She presents to the clinic today for follow-up. Shes tolerating Tamoxifen very well  ALLERGIES:  is allergic to ace inhibitors.  MEDICATIONS:  Current Outpatient Medications  Medication Sig Dispense Refill   ACCU-CHEK AVIVA PLUS test strip USE TO TEST BLOOD SUGAR ONCE DAILY. DX E11.9 100 strip 2   acetaminophen (TYLENOL) 500 MG tablet Take 500 mg by mouth every 6 (six) hours as needed for mild pain.     amLODipine (NORVASC) 10 MG tablet Take 1 tablet (10 mg total) by mouth daily. 90 tablet 1   aspirin 81 MG tablet Take 81 mg by mouth daily.     atorvastatin (LIPITOR) 20 MG tablet Take 1 tablet (20 mg total) by mouth daily at 6 PM. 90 tablet 1   Blood Glucose Monitoring Suppl (LDR BLOOD GLUCOSE TRUETEST) w/Device KIT 1 Device by Does not apply route daily. Test blood sugar once daily. Dx: E11.9 1 kit 0   cetirizine (ZYRTEC) 10 MG tablet TAKE 1 TABLET BY MOUTH EVERY DAY 90 tablet 0   cloNIDine (CATAPRES) 0.2 MG tablet TAKE 1 TABLET BY MOUTH 2 TIMES DAILY. 180 tablet 1   dapagliflozin propanediol (FARXIGA) 10 MG TABS tablet Take 1 tablet (10 mg total) by mouth daily. 90 tablet 1   fluticasone (FLONASE) 50 MCG/ACT nasal spray Place 1-2 sprays into both nostrils daily. 16 g 6   glipiZIDE (GLUCOTROL) 10 MG tablet TAKE 2 TABLETS (20 MG TOTAL) BY MOUTH 2 (TWO) TIMES DAILY BEFORE A MEAL. 360 tablet 1   Lancets MISC Test blood sugar once daily. Dx: E11.9 100 each 3  losartan (COZAAR) 100 MG tablet TAKE 1 TABLET BY MOUTH EVERY DAY 90 tablet 1   metFORMIN (GLUCOPHAGE) 1000 MG tablet Take 1 tablet (1,000 mg total) by mouth 2 (two) times daily with a meal. 180 tablet 1   metoprolol (TOPROL-XL) 200 MG 24 hr tablet TAKE 1 TABLET BY MOUTH EVERY DAY 90 tablet 1   triamcinolone cream (KENALOG) 0.1 % Apply 1 application topically 2 (two) times daily. 30 g 1   No current facility-administered medications for this visit.    PHYSICAL EXAMINATION: ECOG PERFORMANCE STATUS: 0 -  Asymptomatic  Vitals:   07/06/21 1059  BP: (!) 148/69  Pulse: 78  Resp: 18  Temp: 98 F (36.7 C)  SpO2: 99%   Filed Weights   07/06/21 1059  Weight: 197 lb 1.6 oz (89.4 kg)      LABORATORY DATA:  I have reviewed the data as listed CMP Latest Ref Rng & Units 12/15/2020 08/26/2020 05/13/2020  Glucose 65 - 99 mg/dL 186(H) 94 104(H)  BUN 8 - 27 mg/dL '14 19 16  ' Creatinine 0.57 - 1.00 mg/dL 1.04(H) 1.10(H) 1.07(H)  Sodium 134 - 144 mmol/L 140 139 140  Potassium 3.5 - 5.2 mmol/L 4.3 4.6 4.4  Chloride 96 - 106 mmol/L 107(H) 106 104  CO2 20 - 29 mmol/L 22 19(L) 20  Calcium 8.7 - 10.3 mg/dL 10.2 10.4(H) 10.5(H)  Total Protein 6.0 - 8.5 g/dL 6.4 7.1 6.9  Total Bilirubin 0.0 - 1.2 mg/dL 0.4 0.3 0.3  Alkaline Phos 44 - 121 IU/L 56 59 62  AST 0 - 40 IU/L '20 14 16  ' ALT 0 - 32 IU/L '21 17 15    ' Lab Results  Component Value Date   WBC 5.4 08/26/2020   HGB 13.1 08/26/2020   HCT 39.9 08/26/2020   MCV 86 08/26/2020   PLT 263 08/26/2020   NEUTROABS 3.0 07/10/2019    ASSESSMENT & PLAN:  Ductal carcinoma in situ (DCIS) of right breast 07/04/2019:Routine screening mammogram detected 1.6cm mass in the right breast at the 9 o'clock position, loosely group calcifications in the upper right breast spanning 3.6cm, and no axillary adenopathy. Biopsy showed intermediate grade DCIS, ER 100%, PR 100%.  Tix Nx Stage 0   Treatment summary: 1.  08/09/2019: Right lumpectomy: DCIS with calcifications, low-grade, 1.4 cm, ALH, margins negative, second lumpectomy: DCIS with calcifications, low-grade, 0.9 cm, ER 100%, PR 100%, Tis NX stage 0 2. Adjuvant radiation therapy will be completed 10/04/2019 3.  Adjuvant antiestrogen therapy with tamoxifen x5 years started 09/30/2019   Tamoxifen toxicities: No side effects   Breast cancer surveillance: 1.  Breast exam 07/06/2021: Benign 2. Mammogram 06/23/2021: Benign breast density category B   Return to clinic in 1 year for follow-up    No orders of the defined  types were placed in this encounter.  The patient has a good understanding of the overall plan. she agrees with it. she will call with any problems that may develop before the next visit here.  Total time spent: 20 mins including face to face time and time spent for planning, charting and coordination of care  Rulon Eisenmenger, MD, MPH 07/06/2021  I, Thana Ates, am acting as scribe for Dr. Nicholas Lose.  I have reviewed the above documentation for accuracy and completeness, and I agree with the above.

## 2021-07-06 ENCOUNTER — Other Ambulatory Visit: Payer: Self-pay

## 2021-07-06 ENCOUNTER — Inpatient Hospital Stay: Payer: Medicare Other | Attending: Hematology and Oncology | Admitting: Hematology and Oncology

## 2021-07-06 DIAGNOSIS — Z7982 Long term (current) use of aspirin: Secondary | ICD-10-CM | POA: Insufficient documentation

## 2021-07-06 DIAGNOSIS — D0511 Intraductal carcinoma in situ of right breast: Secondary | ICD-10-CM | POA: Insufficient documentation

## 2021-07-06 DIAGNOSIS — Z79899 Other long term (current) drug therapy: Secondary | ICD-10-CM | POA: Diagnosis not present

## 2021-07-06 DIAGNOSIS — Z79810 Long term (current) use of selective estrogen receptor modulators (SERMs): Secondary | ICD-10-CM | POA: Diagnosis not present

## 2021-07-06 DIAGNOSIS — Z923 Personal history of irradiation: Secondary | ICD-10-CM | POA: Insufficient documentation

## 2021-07-06 MED ORDER — TAMOXIFEN CITRATE 20 MG PO TABS: 20.0000 mg | ORAL_TABLET | Freq: Every day | ORAL | 3 refills | Status: DC

## 2021-07-07 ENCOUNTER — Telehealth: Payer: Self-pay | Admitting: Hematology and Oncology

## 2021-07-07 NOTE — Telephone Encounter (Signed)
Scheduled appointment per 08/02 los. Left message.

## 2021-07-11 ENCOUNTER — Other Ambulatory Visit: Payer: Self-pay | Admitting: Family Medicine

## 2021-07-11 DIAGNOSIS — E1129 Type 2 diabetes mellitus with other diabetic kidney complication: Secondary | ICD-10-CM

## 2021-07-11 DIAGNOSIS — R809 Proteinuria, unspecified: Secondary | ICD-10-CM

## 2021-07-18 ENCOUNTER — Other Ambulatory Visit: Payer: Self-pay | Admitting: Family Medicine

## 2021-07-18 DIAGNOSIS — J309 Allergic rhinitis, unspecified: Secondary | ICD-10-CM

## 2021-07-18 DIAGNOSIS — E782 Mixed hyperlipidemia: Secondary | ICD-10-CM

## 2021-07-18 DIAGNOSIS — E1129 Type 2 diabetes mellitus with other diabetic kidney complication: Secondary | ICD-10-CM

## 2021-07-18 DIAGNOSIS — I10 Essential (primary) hypertension: Secondary | ICD-10-CM

## 2021-07-18 DIAGNOSIS — R809 Proteinuria, unspecified: Secondary | ICD-10-CM

## 2021-08-10 ENCOUNTER — Other Ambulatory Visit: Payer: Self-pay | Admitting: Family Medicine

## 2021-08-10 DIAGNOSIS — R809 Proteinuria, unspecified: Secondary | ICD-10-CM

## 2021-08-10 DIAGNOSIS — E1129 Type 2 diabetes mellitus with other diabetic kidney complication: Secondary | ICD-10-CM

## 2021-08-24 ENCOUNTER — Other Ambulatory Visit: Payer: Self-pay | Admitting: Family Medicine

## 2021-08-24 DIAGNOSIS — R809 Proteinuria, unspecified: Secondary | ICD-10-CM

## 2021-08-24 DIAGNOSIS — J309 Allergic rhinitis, unspecified: Secondary | ICD-10-CM

## 2021-09-09 ENCOUNTER — Ambulatory Visit (INDEPENDENT_AMBULATORY_CARE_PROVIDER_SITE_OTHER): Payer: Medicare Other | Admitting: Family Medicine

## 2021-09-09 ENCOUNTER — Other Ambulatory Visit: Payer: Self-pay

## 2021-09-09 ENCOUNTER — Encounter: Payer: Self-pay | Admitting: Family Medicine

## 2021-09-09 VITALS — BP 128/74 | HR 70 | Temp 98.1°F | Resp 16 | Ht 64.0 in | Wt 196.4 lb

## 2021-09-09 DIAGNOSIS — E782 Mixed hyperlipidemia: Secondary | ICD-10-CM | POA: Diagnosis not present

## 2021-09-09 DIAGNOSIS — B3731 Acute candidiasis of vulva and vagina: Secondary | ICD-10-CM

## 2021-09-09 DIAGNOSIS — R809 Proteinuria, unspecified: Secondary | ICD-10-CM | POA: Diagnosis not present

## 2021-09-09 DIAGNOSIS — I10 Essential (primary) hypertension: Secondary | ICD-10-CM | POA: Diagnosis not present

## 2021-09-09 DIAGNOSIS — E1129 Type 2 diabetes mellitus with other diabetic kidney complication: Secondary | ICD-10-CM

## 2021-09-09 LAB — COMPREHENSIVE METABOLIC PANEL
ALT: 26 U/L (ref 0–35)
AST: 27 U/L (ref 0–37)
Albumin: 3.9 g/dL (ref 3.5–5.2)
Alkaline Phosphatase: 49 U/L (ref 39–117)
BUN: 13 mg/dL (ref 6–23)
CO2: 24 mEq/L (ref 19–32)
Calcium: 10.2 mg/dL (ref 8.4–10.5)
Chloride: 106 mEq/L (ref 96–112)
Creatinine, Ser: 0.99 mg/dL (ref 0.40–1.20)
GFR: 56.07 mL/min — ABNORMAL LOW (ref >60.00)
Glucose, Bld: 72 mg/dL (ref 70–99)
Potassium: 4 mEq/L (ref 3.5–5.1)
Sodium: 138 mEq/L (ref 135–145)
Total Bilirubin: 0.4 mg/dL (ref 0.2–1.2)
Total Protein: 7.2 g/dL (ref 6.0–8.3)

## 2021-09-09 LAB — LIPID PANEL
Cholesterol: 116 mg/dL (ref 0–200)
HDL: 38.3 mg/dL — ABNORMAL LOW (ref >39.00)
LDL Cholesterol: 60 mg/dL (ref 0–99)
NonHDL: 78.11
Total CHOL/HDL Ratio: 3
Triglycerides: 89 mg/dL (ref 0.0–149.0)
VLDL: 17.8 mg/dL (ref 0.0–40.0)

## 2021-09-09 LAB — HEMOGLOBIN A1C: Hgb A1c MFr Bld: 8.1 % — ABNORMAL HIGH (ref 4.6–6.5)

## 2021-09-09 MED ORDER — METOPROLOL SUCCINATE ER 200 MG PO TB24: 200.0000 mg | ORAL_TABLET | Freq: Every day | ORAL | 1 refills | Status: DC

## 2021-09-09 MED ORDER — CLONIDINE HCL 0.2 MG PO TABS: 0.2000 mg | ORAL_TABLET | Freq: Two times a day (BID) | ORAL | 1 refills | Status: DC

## 2021-09-09 MED ORDER — FLUCONAZOLE 150 MG PO TABS: 150.0000 mg | ORAL_TABLET | Freq: Every day | ORAL | 0 refills | Status: DC

## 2021-09-09 MED ORDER — METFORMIN HCL 1000 MG PO TABS: 1000.0000 mg | ORAL_TABLET | Freq: Two times a day (BID) | ORAL | 1 refills | Status: DC

## 2021-09-09 MED ORDER — GLIPIZIDE 10 MG PO TABS: 20.0000 mg | ORAL_TABLET | Freq: Two times a day (BID) | ORAL | 1 refills | Status: DC

## 2021-09-09 MED ORDER — LOSARTAN POTASSIUM 100 MG PO TABS: 100.0000 mg | ORAL_TABLET | Freq: Every day | ORAL | 1 refills | Status: DC

## 2021-09-09 NOTE — Progress Notes (Signed)
Subjective:  Patient ID: Susan Miles, female    DOB: July 06, 1946  Age: 75 y.o. MRN: [id number]  CC:  Chief Complaint  Patient presents with   Diabetes    Pt here for refills no concerns regarding DM today    Vaginitis    Pt had a yeast infection last week when he went to [facility] and they treated her there but pt would like to be sure gone, pt reports some discharge still     HPI Susan Miles presents for   Diabetes: With microalbuminuria, CKD, followed by nephrology.  Treated with Farxiga, glipizide, metformin.  Improved her readings at last visit compared to her A1c.  We decided to remain on same regimen based on her improved home readings. Does report recent genital infection/yeast infection recently receiving treatment at nephrology, still with some discharge. Treated with antibitoic for UTiand 1 pill for yeast infection. Still mild white discharge. No itching.  No repetitive genital infections.   Fasting readings - 116-169 No symptomatic lows. No postprandial readings.  She is on ARB, statin. Microalbumin: Ratio 63 on 12/15/2020 Optho, foot exam, pneumovax:   Lab Results  Component Value Date   HGBA1C 8.2 (H) 04/07/2021   HGBA1C 8.7 (H) 12/15/2020   HGBA1C 7.2 (H) 08/26/2020   Lab Results  Component Value Date   MICROALBUR 7.5 03/23/2016   LDLCALC 69 12/15/2020   CREATININE 1.04 (H) 12/15/2020   Hyperlipidemia: Lipitor 20 mg daily, no new myalgias.  Lab Results  Component Value Date   CHOL 122 12/15/2020   HDL 38 (L) 12/15/2020   LDLCALC 69 12/15/2020   TRIG 73 12/15/2020   CHOLHDL 3.2 12/15/2020   Lab Results  Component Value Date   ALT 21 12/15/2020   AST 20 12/15/2020   ALKPHOS 56 12/15/2020   BILITOT 0.4 12/15/2020   Hypertension: With CKD, followed by nephrology, treated with amlodipine, Toprol, losartan, clonidine. No new side effects  Home BP 116/70 range.  BP Readings from Last 3 Encounters:  09/09/21 128/74  07/06/21 (!)  148/69  04/07/21 140/82   Lab Results  Component Value Date   CREATININE 1.04 (H) 12/15/2020      History Patient Active Problem List   Diagnosis Date Noted   Family history of breast cancer    Ductal carcinoma in situ (DCIS) of right breast 07/04/2019   Hyperlipidemia 12/21/2017   Breast asymmetry 05/01/2015   Cataract, nuclear 05/24/2012   HTN (hypertension) 02/10/2012   DM2 (diabetes mellitus, type 2) ([place]) 02/10/2012   Past Medical History:  Diagnosis Date   Breast cancer ([place])    Cancer ([place])    right breast cancer   Diabetes mellitus without complication ([place])    Family history of breast cancer    Hypertension    Personal history of radiation therapy    Sleep apnea    does not use CPAP   Past Surgical History:  Procedure Laterality Date   ABDOMINAL HYSTERECTOMY     BREAST LUMPECTOMY     BREAST LUMPECTOMY WITH RADIOACTIVE SEED LOCALIZATION Right 08/09/2019   Procedure: RIGHT BREAST LUMPECTOMY X 2 WITH RADIOACTIVE SEED LOCALIZATION (3 SEEDS);  Surgeon: Alphonsa Overall, MD;  Location: [facility];  Service: General;  Laterality: Right;   Allergies  Allergen Reactions   Ace Inhibitors Cough   Prior to Admission medications   Medication Sig Start Date End Date Taking? Authorizing Provider  ACCU-CHEK AVIVA PLUS test strip USE TO TEST BLOOD SUGAR ONCE DAILY. DX  E11.9 02/25/21  Yes [name], MD  acetaminophen (TYLENOL) 500 MG tablet Take 500 mg by mouth every 6 (six) hours as needed for mild pain.   Yes [provider]  amLODipine (NORVASC) 10 MG tablet TAKE 1 TABLET BY MOUTH EVERY DAY 07/19/21  Yes [name], MD  aspirin 81 MG tablet Take 81 mg by mouth daily.   Yes [provider]  atorvastatin (LIPITOR) 20 MG tablet TAKE 1 TABLET BY MOUTH DAILY AT 6 PM. 07/19/21  Yes [name], MD  Blood Glucose Monitoring Suppl (LDR BLOOD GLUCOSE TRUETEST) w/Device KIT 1 Device by Does not apply route daily. Test blood sugar once  daily. Dx: E11.9 05/13/20  Yes [name], MD  cetirizine (ZYRTEC) 10 MG tablet TAKE 1 TABLET BY MOUTH EVERY DAY 08/24/21  Yes [name], MD  cloNIDine (CATAPRES) 0.2 MG tablet TAKE 1 TABLET BY MOUTH 2 TIMES DAILY. 05/24/21  Yes [name], MD  FARXIGA 10 MG TABS tablet TAKE 1 TABLET BY MOUTH EVERY DAY 07/19/21  Yes [name], MD  fluticasone [facility]) 50 MCG/ACT nasal spray Place 1-2 sprays into both nostrils daily. 04/02/18  Yes [name], MD  glipiZIDE (GLUCOTROL) 10 MG tablet TAKE 2 TABLETS (20 MG TOTAL) BY MOUTH 2 (TWO) TIMES DAILY BEFORE A MEAL. 06/09/21  Yes [name], MD  Lancets MISC Test blood sugar once daily. Dx: E11.9 01/24/18  Yes [name], MD  losartan (COZAAR) 100 MG tablet TAKE 1 TABLET BY MOUTH EVERY DAY 06/09/21  Yes [name], MD  metFORMIN (GLUCOPHAGE) 1000 MG tablet TAKE 1 TABLET (1,000 MG TOTAL) BY MOUTH 2 (TWO) TIMES DAILY WITH A MEAL. 08/10/21  Yes [name], MD  metoprolol (TOPROL-XL) 200 MG 24 hr tablet TAKE 1 TABLET BY MOUTH EVERY DAY 06/09/21  Yes [name], MD  tamoxifen (NOLVADEX) 20 MG tablet Take 1 tablet (20 mg total) by mouth daily. 07/06/21  Yes Nicholas Lose, MD  triamcinolone cream (KENALOG) 0.1 % Apply 1 application topically 2 (two) times daily. Patient not taking: Reported on 09/09/2021 08/26/20   [name], MD   Social History   Socioeconomic History   Marital status: Married    Spouse name: Not on file   Number of children: Not on file   Years of education: Not on file   Highest education level: Not on file  Occupational History   Not on file  Tobacco Use   Smoking status: Never   Smokeless tobacco: Never  Substance and Sexual Activity   Alcohol use: No   Drug use: No   Sexual activity: Not on file  Other Topics Concern   Not on file  Social History Narrative   Not on file   Social Determinants of Health   Financial Resource Strain: Not on file  Food Insecurity: Not on  file  Transportation Needs: Not on file  Physical Activity: Not on file  Stress: Not on file  Social Connections: Not on file  Intimate Partner Violence: Not on file    Review of Systems  Constitutional:  Negative for fatigue and unexpected weight change.  Respiratory:  Negative for chest tightness and shortness of breath.   Cardiovascular:  Negative for chest pain, palpitations and leg swelling.  Gastrointestinal:  Negative for abdominal pain and blood in stool.  Neurological:  Negative for dizziness, syncope, light-headedness and headaches.    Objective:   Vitals:   09/09/21 1325  BP: 128/74  Pulse: 70  Resp: 16  Temp: 98.1 F (36.7 C)  TempSrc: Temporal  SpO2: 98%  Weight: 196 lb 6.4 oz (89.1 kg)  Height: '5\' 4"'  (1.626 m)     Physical Exam Vitals reviewed.  Constitutional:      Appearance: Normal appearance. She is well-developed.  HENT:     Head: Normocephalic and atraumatic.  Eyes:     Conjunctiva/sclera: Conjunctivae normal.     Pupils: Pupils are equal, round, and reactive to light.  Neck:     Vascular: No carotid bruit.  Cardiovascular:     Rate and Rhythm: Normal rate and regular rhythm.     Heart sounds: Normal heart sounds.  Pulmonary:     Effort: Pulmonary effort is normal.     Breath sounds: Normal breath sounds.  Abdominal:     General: There is no distension.     Palpations: Abdomen is soft. There is no pulsatile mass.     Tenderness: There is no abdominal tenderness.  Musculoskeletal:     Right lower leg: No edema.     Left lower leg: No edema.  Skin:    General: Skin is warm and dry.  Neurological:     Mental Status: She is alert and oriented to person, place, and time.  Psychiatric:        Mood and Affect: Mood normal.        Behavior: Behavior normal.     Assessment & Plan:  Susan Miles is a 75 y.o. female . Type 2 diabetes mellitus with microalbuminuria, without long-term current use of insulin (HCC) - Plan: Comprehensive  metabolic panel, Hemoglobin A1c, glipiZIDE (GLUCOTROL) 10 MG tablet, metFORMIN (GLUCOPHAGE) 1000 MG tablet  - Tolerating current regimen, slightly elevated A1c last visit.  Goal less than 7.5.  Check labs, no med changes for now.  60-monthfollow-up.  Mixed hyperlipidemia - Plan: Lipid panel, Comprehensive metabolic panel  - Tolerating current regimen, continue same dose statin.  Check labs  Essential hypertension - Plan: cloNIDine (CATAPRES) 0.2 MG tablet, losartan (COZAAR) 100 MG tablet, metoprolol (TOPROL-XL) 200 MG 24 hr tablet  -  Stable, tolerating current regimen. Medications refilled. Labs pending as above.   Candida vaginitis - Plan: fluconazole (DIFLUCAN) 150 MG tablet  - At risk for candidal vaginitis with use of Farxiga, diabetes, but also recent antibiotics for UTI.  Repeat Diflucan x1, then RTC precautions if persistent symptoms for exam/further testing  Meds ordered this encounter  Medications   cloNIDine (CATAPRES) 0.2 MG tablet    Sig: Take 1 tablet (0.2 mg total) by mouth 2 (two) times daily.    Dispense:  180 tablet    Refill:  1   glipiZIDE (GLUCOTROL) 10 MG tablet    Sig: Take 2 tablets (20 mg total) by mouth 2 (two) times daily before a meal.    Dispense:  360 tablet    Refill:  1   losartan (COZAAR) 100 MG tablet    Sig: Take 1 tablet (100 mg total) by mouth daily.    Dispense:  90 tablet    Refill:  1   metFORMIN (GLUCOPHAGE) 1000 MG tablet    Sig: Take 1 tablet (1,000 mg total) by mouth 2 (two) times daily with a meal.    Dispense:  180 tablet    Refill:  1   metoprolol (TOPROL-XL) 200 MG 24 hr tablet    Sig: Take 1 tablet (200 mg total) by mouth daily.    Dispense:  90 tablet    Refill:  1   fluconazole (DIFLUCAN) 150 MG tablet    Sig: Take 1 tablet (150 mg total) by mouth daily.    Dispense:  1 tablet    Refill:  0   Patient Instructions   Diflucan once for possible yeast infection or continued yeast infection, but I expect that to resolve your  symptoms.  If any continued or return of symptoms please return for recheck and other testing.  Depending on diabetes test and other labs we can discuss change in medicines but no changes for now.  Let me know if there are questions and take care.     Signed,   [name], MD [place], [facility] 09/09/21 2:17 PM

## 2021-09-09 NOTE — Patient Instructions (Signed)
Diflucan once for possible yeast infection or continued yeast infection, but I expect that to resolve your symptoms.  If any continued or return of symptoms please return for recheck and other testing.  Depending on diabetes test and other labs we can discuss change in medicines but no changes for now.  Let me know if there are questions and take care.

## 2021-11-18 ENCOUNTER — Other Ambulatory Visit: Payer: Self-pay | Admitting: Family Medicine

## 2021-12-03 ENCOUNTER — Ambulatory Visit (INDEPENDENT_AMBULATORY_CARE_PROVIDER_SITE_OTHER): Payer: Medicare Other | Admitting: Family Medicine

## 2021-12-03 VITALS — BP 132/70 | HR 70 | Temp 98.0°F | Resp 17 | Ht 64.0 in | Wt 194.4 lb

## 2021-12-03 DIAGNOSIS — R059 Cough, unspecified: Secondary | ICD-10-CM | POA: Diagnosis not present

## 2021-12-03 DIAGNOSIS — J0110 Acute frontal sinusitis, unspecified: Secondary | ICD-10-CM | POA: Diagnosis not present

## 2021-12-03 MED ORDER — BENZONATATE 100 MG PO CAPS
100.0000 mg | ORAL_CAPSULE | Freq: Three times a day (TID) | ORAL | 0 refills | Status: DC | PRN
Start: 2021-12-03 — End: 2022-04-12

## 2021-12-03 MED ORDER — AMOXICILLIN-POT CLAVULANATE 875-125 MG PO TABS: 1.0000 | ORAL_TABLET | Freq: Two times a day (BID) | ORAL | 0 refills | Status: DC

## 2021-12-03 MED ORDER — PROMETHAZINE-DM 6.25-15 MG/5ML PO SYRP: 2.5000 mL | ORAL_SOLUTION | Freq: Every evening | ORAL | 0 refills | Status: DC | PRN

## 2021-12-03 NOTE — Patient Instructions (Addendum)
Less likely Covid with negative test. I do recommend covid bivalent booster when feeling better.  Saline nasal spray for congestion, mucinex or tessalon perles for cough. If tessalon not effective, then can take cough syrup if needed to sleep.   Xray at [facility] today.  Start antibiotic for sinus infection.   Return to the clinic or go to the nearest emergency room if any of your symptoms worsen or new symptoms occur.  Sinusitis, Adult Sinusitis is inflammation of your sinuses. Sinuses are hollow spaces in the bones around your face. Your sinuses are located: Around your eyes. In the middle of your forehead. Behind your nose. In your cheekbones. Mucus normally drains out of your sinuses. When your nasal tissues become inflamed or swollen, mucus can become trapped or blocked. This allows bacteria, viruses, and fungi to grow, which leads to infection. Most infections of the sinuses are caused by a virus. Sinusitis can develop quickly. It can last for up to 4 weeks (acute) or for more than 12 weeks (chronic). Sinusitis often develops after a cold. What are the causes? This condition is caused by anything that creates swelling in the sinuses or stops mucus from draining. This includes: Allergies. Asthma. Infection from bacteria or viruses. Deformities or blockages in your nose or sinuses. Abnormal growths in the nose (nasal polyps). Pollutants, such as chemicals or irritants in the air. Infection from fungi (rare). What increases the risk? You are more likely to develop this condition if you: Have a weak body defense system (immune system). Do a lot of swimming or diving. Overuse nasal sprays. Smoke. What are the signs or symptoms? The main symptoms of this condition are pain and a feeling of pressure around the affected sinuses. Other symptoms include: Stuffy nose or congestion. Thick drainage from your nose. Swelling and warmth over the affected sinuses. Headache. Upper  toothache. A cough that may get worse at night. Extra mucus that collects in the throat or the back of the nose (postnasal drip). Decreased sense of smell and taste. Fatigue. A fever. Sore throat. Bad breath. How is this diagnosed? This condition is diagnosed based on: Your symptoms. Your medical history. A physical exam. Tests to find out if your condition is acute or chronic. This may include: Checking your nose for nasal polyps. Viewing your sinuses using a device that has a light (endoscope). Testing for allergies or bacteria. Imaging tests, such as an MRI or CT scan. In rare cases, a bone biopsy may be done to rule out more serious types of fungal sinus disease. How is this treated? Treatment for sinusitis depends on the cause and whether your condition is chronic or acute. If caused by a virus, your symptoms should go away on their own within 10 days. You may be given medicines to relieve symptoms. They include: Medicines that shrink swollen nasal passages (topical intranasal decongestants). Medicines that treat allergies (antihistamines). A spray that eases inflammation of the nostrils (topical intranasal corticosteroids). Rinses that help get rid of thick mucus in your nose (nasal saline washes). If caused by bacteria, your health care provider may recommend waiting to see if your symptoms improve. Most bacterial infections will get better without antibiotic medicine. You may be given antibiotics if you have: A severe infection. A weak immune system. If caused by narrow nasal passages or nasal polyps, you may need to have surgery. Follow these instructions at home: Medicines Take, use, or apply over-the-counter and prescription medicines only as told by your health care provider.  These may include nasal sprays. If you were prescribed an antibiotic medicine, take it as told by your health care provider. Do not stop taking the antibiotic even if you start to feel  better. Hydrate and humidify  Drink enough fluid to keep your urine pale yellow. Staying hydrated will help to thin your mucus. Use a cool mist humidifier to keep the humidity level in your home above 50%. Inhale steam for 10-15 minutes, 3-4 times a day, or as told by your health care provider. You can do this in the bathroom while a hot shower is running. Limit your exposure to cool or dry air. Rest Rest as much as possible. Sleep with your head raised (elevated). Make sure you get enough sleep each night. General instructions  Apply a warm, moist washcloth to your face 3-4 times a day or as told by your health care provider. This will help with discomfort. Wash your hands often with soap and water to reduce your exposure to germs. If soap and water are not available, use hand sanitizer. Do not smoke. Avoid being around people who are smoking (secondhand smoke). Keep all follow-up visits as told by your health care provider. This is important. Contact a health care provider if: You have a fever. Your symptoms get worse. Your symptoms do not improve within 10 days. Get help right away if: You have a severe headache. You have persistent vomiting. You have severe pain or swelling around your face or eyes. You have vision problems. You develop confusion. Your neck is stiff. You have trouble breathing. Summary Sinusitis is soreness and inflammation of your sinuses. Sinuses are hollow spaces in the bones around your face. This condition is caused by nasal tissues that become inflamed or swollen. The swelling traps or blocks the flow of mucus. This allows bacteria, viruses, and fungi to grow, which leads to infection. If you were prescribed an antibiotic medicine, take it as told by your health care provider. Do not stop taking the antibiotic even if you start to feel better. Keep all follow-up visits as told by your health care provider. This is important. This information is not  intended to replace advice given to you by your health care provider. Make sure you discuss any questions you have with your health care provider. Document Revised: 04/23/2018 Document Reviewed: 04/23/2018 Elsevier Patient Education  2022 [employer].   Cough, Adult Coughing is a reflex that clears your throat and your airways (respiratory system). Coughing helps to heal and protect your lungs. It is normal to cough occasionally, but a cough that happens with other symptoms or lasts a long time may be a sign of a condition that needs treatment. An acute cough may only last 2-3 weeks, while a chronic cough may last 8 or more weeks. Coughing is commonly caused by: Infection of the respiratory systemby viruses or bacteria. Breathing in substances that irritate your lungs. Allergies. Asthma. Mucus that runs down the back of your throat (postnasal drip). Smoking. Acid backing up from the stomach into the esophagus (gastroesophageal reflux). Certain medicines. Chronic lung problems. Other medical conditions such as heart failure or a blood clot in the lung (pulmonary embolism). Follow these instructions at home: Medicines Take over-the-counter and prescription medicines only as told by your health care provider. Talk with your health care provider before you take a cough suppressant medicine. Lifestyle  Avoid cigarette smoke. Do not use any products that contain nicotine or tobacco, such as cigarettes, e-cigarettes, and chewing tobacco. If  you need help quitting, ask your health care provider. Drink enough fluid to keep your urine pale yellow. Avoid caffeine. Do not drink alcohol if your health care provider tells you not to drink. General instructions  Pay close attention to changes in your cough. Tell your health care provider about them. Always cover your mouth when you cough. Avoid things that make you cough, such as perfume, candles, cleaning products, or campfire or tobacco  smoke. If the air is dry, use a cool mist vaporizer or humidifier in your bedroom or your home to help loosen secretions. If your cough is worse at night, try to sleep in a semi-upright position. Rest as needed. Keep all follow-up visits as told by your health care provider. This is important. Contact a health care provider if you: Have new symptoms. Cough up pus. Have a cough that does not get better after 2-3 weeks or gets worse. Cannot control your cough with cough suppressant medicines and you are losing sleep. Have pain that gets worse or pain that is not helped with medicine. Have a fever. Have unexplained weight loss. Have night sweats. Get help right away if: You cough up blood. You have difficulty breathing. Your heartbeat is very fast. These symptoms may represent a serious problem that is an emergency. Do not wait to see if the symptoms will go away. Get medical help right away. Call your local emergency services (911 in the [place] Do not drive yourself to the hospital. Summary Coughing is a reflex that clears your throat and your airways. It is normal to cough occasionally, but a cough that happens with other symptoms or lasts a long time may be a sign of a condition that needs treatment. Take over-the-counter and prescription medicines only as told by your health care provider. Always cover your mouth when you cough. Contact a health care provider if you have new symptoms or a cough that does not get better after 2-3 weeks or gets worse. This information is not intended to replace advice given to you by your health care provider. Make sure you discuss any questions you have with your health care provider. Document Revised: 12/10/2018 Document Reviewed: 12/10/2018 Elsevier Patient Education  [place].

## 2021-12-03 NOTE — Progress Notes (Signed)
Subjective:  Patient ID: Susan Miles, female    DOB: 07/22/1946  Age: 75 y.o. MRN: [id number]  CC:  Chief Complaint  Patient presents with   Cough    Pt went to [facility] for family and has reported sinus congestion and some cough for 2 weeks now, negative COVID test no other sxs at this time     HPI Susan Miles presents for   Cough, sinus congestion Trip to brother's funeral in [facility] 2 weeks ago. Initial sx's around 12/16. Cough, sinus drainage, headache. Did have fever up to 101 - resolved last week.  Covid test negative about 5 days into symptoms. No known exposure to covid/flu.  Worst part is sinuses - yellow nasal d/c and sinus pressure, HA. No recent fever. Cough at night impacting sleep. Min shortness of breath with walking/activity - same -no worsening. No chest pain.   Tx: alka seltzer plus, cough syrup, tylenol sinus - min relief.    Flu vaccine at [facility] in October.  COVID vaccination, most recent booster:09/2020.  COVID risk of complication score of 6.  Immunization History  Administered Date(s) Administered   DTaP 02/03/2007   Influenza Split 09/17/2012   Influenza, High Dose Seasonal PF 09/22/2018, 09/06/2019   Influenza,inj,Quad PF,6+ Mos 09/30/2013, 07/28/2014, 09/21/2015, 09/22/2016, 08/03/2017   Influenza-Unspecified 08/19/2020   PFIZER(Purple Top)SARS-COV-2 Vaccination 01/10/2020, 01/31/2020, 10/03/2020   Pneumococcal Conjugate-13 12/05/2010, 10/27/2014   Pneumococcal Polysaccharide-23 09/21/2015   Zoster, Live 03/19/2014    History Patient Active Problem List   Diagnosis Date Noted   Family history of breast cancer    Ductal carcinoma in situ (DCIS) of right breast 07/04/2019   Hyperlipidemia 12/21/2017   Breast asymmetry 05/01/2015   Cataract, nuclear 05/24/2012   HTN (hypertension) 02/10/2012   DM2 (diabetes mellitus, type 2) ([place]) 02/10/2012   Past Medical History:  Diagnosis Date   Breast cancer ([place])    Cancer ([place])    right breast cancer    Diabetes mellitus without complication ([place])    Family history of breast cancer    Hypertension    Personal history of radiation therapy    Sleep apnea    does not use CPAP   Past Surgical History:  Procedure Laterality Date   ABDOMINAL HYSTERECTOMY     BREAST LUMPECTOMY     BREAST LUMPECTOMY WITH RADIOACTIVE SEED LOCALIZATION Right 08/09/2019   Procedure: RIGHT BREAST LUMPECTOMY X 2 WITH RADIOACTIVE SEED LOCALIZATION (3 SEEDS);  Surgeon: Alphonsa Overall, MD;  Location: [place];  Service: General;  Laterality: Right;   Allergies  Allergen Reactions   Ace Inhibitors Cough   Prior to Admission medications   Medication Sig Start Date End Date Taking? Authorizing Provider  ACCU-CHEK AVIVA PLUS test strip USE TO TEST BLOOD SUGAR ONCE DAILY. DX E11.9 11/18/21  Yes [name], MD  acetaminophen (TYLENOL) 500 MG tablet Take 500 mg by mouth every 6 (six) hours as needed for mild pain.   Yes [provider]  amLODipine (NORVASC) 10 MG tablet TAKE 1 TABLET BY MOUTH EVERY DAY 07/19/21  Yes [name], MD  aspirin 81 MG tablet Take 81 mg by mouth daily.   Yes [provider]  atorvastatin (LIPITOR) 20 MG tablet TAKE 1 TABLET BY MOUTH DAILY AT 6 PM. 07/19/21  Yes [name], MD  Blood Glucose Monitoring Suppl (LDR BLOOD GLUCOSE TRUETEST) w/Device KIT 1 Device by Does not apply route daily. Test blood sugar once daily. Dx: E11.9 05/13/20  Yes [name], MD  cetirizine (ZYRTEC) 10 MG tablet TAKE 1 TABLET BY MOUTH EVERY DAY 08/24/21  Yes [name], MD  cloNIDine (CATAPRES) 0.2 MG tablet Take 1 tablet (0.2 mg total) by mouth 2 (two) times daily. 09/09/21  Yes [name], MD  FARXIGA 10 MG TABS tablet TAKE 1 TABLET BY MOUTH EVERY DAY 07/19/21  Yes [name], MD  fluconazole (DIFLUCAN) 150 MG tablet Take 1 tablet (150 mg total) by mouth daily. 09/09/21  Yes [name], MD  fluticasone (FLONASE) 50 MCG/ACT nasal spray  Place 1-2 sprays into both nostrils daily. 04/02/18  Yes [name], MD  glipiZIDE (GLUCOTROL) 10 MG tablet Take 2 tablets (20 mg total) by mouth 2 (two) times daily before a meal. 09/09/21  Yes [name], MD  Lancets MISC Test blood sugar once daily. Dx: E11.9 01/24/18  Yes [name], MD  losartan (COZAAR) 100 MG tablet Take 1 tablet (100 mg total) by mouth daily. 09/09/21  Yes [name], MD  metFORMIN (GLUCOPHAGE) 1000 MG tablet Take 1 tablet (1,000 mg total) by mouth 2 (two) times daily with a meal. 09/09/21  Yes [name], MD  metoprolol (TOPROL-XL) 200 MG 24 hr tablet Take 1 tablet (200 mg total) by mouth daily. 09/09/21  Yes [name], MD  tamoxifen (NOLVADEX) 20 MG tablet Take 1 tablet (20 mg total) by mouth daily. 07/06/21  Yes Nicholas Lose, MD  triamcinolone cream (KENALOG) 0.1 % Apply 1 application topically 2 (two) times daily. 08/26/20  Yes [name], MD   Social History   Socioeconomic History   Marital status: Married    Spouse name: Not on file   Number of children: Not on file   Years of education: Not on file   Highest education level: Not on file  Occupational History   Not on file  Tobacco Use   Smoking status: Never   Smokeless tobacco: Never  Substance and Sexual Activity   Alcohol use: No   Drug use: No   Sexual activity: Not on file  Other Topics Concern   Not on file  Social History Narrative   Not on file   Social Determinants of Health   Financial Resource Strain: Not on file  Food Insecurity: Not on file  Transportation Needs: Not on file  Physical Activity: Not on file  Stress: Not on file  Social Connections: Not on file  Intimate Partner Violence: Not on file    Review of Systems Per HPI   Objective:   Vitals:   12/03/21 1053  BP: 132/70  Pulse: 70  Resp: 17  Temp: 98 F (36.7 C)  TempSrc: Temporal  SpO2: 97%  Weight: 194 lb 6.4 oz (88.2 kg)  Height: _0  (1.626 m)     Physical  Exam Vitals reviewed.  Constitutional:      General: She is not in acute distress.    Appearance: Normal appearance. She is well-developed. She is not ill-appearing or diaphoretic.  HENT:     Head: Normocephalic and atraumatic.     Right Ear: Hearing, tympanic membrane, ear canal and external ear normal.     Left Ear: Hearing, tympanic membrane, ear canal and external ear normal.     Nose: Nose normal.     Comments: R greater than L frontal sinus ttp.     Mouth/Throat:     Pharynx: No posterior oropharyngeal erythema.  Eyes:  Conjunctiva/sclera: Conjunctivae normal.     Pupils: Pupils are equal, round, and reactive to light.  Cardiovascular:     Rate and Rhythm: Normal rate and regular rhythm.     Heart sounds: Normal heart sounds. No murmur heard. Pulmonary:     Effort: Pulmonary effort is normal. No respiratory distress.     Breath sounds: No wheezing or rhonchi.     Comments: Question few coarse sounds left base, normal exam o/w and no distress.  Skin:    General: Skin is warm and dry.     Findings: No rash.  Neurological:     Mental Status: She is alert and oriented to person, place, and time.  Psychiatric:        Mood and Affect: Mood normal.        Behavior: Behavior normal.       Assessment & Plan:  Susan Miles is a 75 y.o. female . Cough, unspecified type - Plan: benzonatate (TESSALON) 100 MG capsule, DG Chest 2 View  Acute frontal sinusitis, recurrence not specified - Plan: amoxicillin-clavulanate (AUGMENTIN) 875-125 MG tablet  Probable initial viral illness, negative COVID testing.  Possible flu.  Now with likely secondary sinusitis, less likely bronchitis.  O2 sats reassuring.  Check chest x-ray, start Augmentin, Tessalon Perles or Phenergan DM cough syrup if needed at night.  ER/RTC precautions given.  Meds ordered this encounter  Medications   benzonatate (TESSALON) 100 MG capsule    Sig: Take 1 capsule (100 mg total) by mouth 3 (three) times daily  as needed for cough.    Dispense:  20 capsule    Refill:  0   promethazine-dextromethorphan (PROMETHAZINE-DM) 6.25-15 MG/5ML syrup    Sig: Take 2.5-5 mLs by mouth at bedtime as needed for cough.    Dispense:  118 mL    Refill:  0   amoxicillin-clavulanate (AUGMENTIN) 875-125 MG tablet    Sig: Take 1 tablet by mouth 2 (two) times daily.    Dispense:  20 tablet    Refill:  0   Patient Instructions  Less likely Covid with negative test. I do recommend covid bivalent booster when feeling better.  Saline nasal spray for congestion, mucinex or tessalon perles for cough. If tessalon not effective, then can take cough syrup if needed to sleep.   Xray at [facility] today.  Start antibiotic for sinus infection.   Return to the clinic or go to the nearest emergency room if any of your symptoms worsen or new symptoms occur.  Sinusitis, Adult Sinusitis is inflammation of your sinuses. Sinuses are hollow spaces in the bones around your face. Your sinuses are located: Around your eyes. In the middle of your forehead. Behind your nose. In your cheekbones. Mucus normally drains out of your sinuses. When your nasal tissues become inflamed or swollen, mucus can become trapped or blocked. This allows bacteria, viruses, and fungi to grow, which leads to infection. Most infections of the sinuses are caused by a virus. Sinusitis can develop quickly. It can last for up to 4 weeks (acute) or for more than 12 weeks (chronic). Sinusitis often develops after a cold. What are the causes? This condition is caused by anything that creates swelling in the sinuses or stops mucus from draining. This includes: Allergies. Asthma. Infection from bacteria or viruses. Deformities or blockages in your nose or sinuses. Abnormal growths in the nose (nasal polyps). Pollutants, such as chemicals or irritants in the air. Infection from fungi (rare). What increases the risk?  You are more likely to develop this condition  if you: Have a weak body defense system (immune system). Do a lot of swimming or diving. Overuse nasal sprays. Smoke. What are the signs or symptoms? The main symptoms of this condition are pain and a feeling of pressure around the affected sinuses. Other symptoms include: Stuffy nose or congestion. Thick drainage from your nose. Swelling and warmth over the affected sinuses. Headache. Upper toothache. A cough that may get worse at night. Extra mucus that collects in the throat or the back of the nose (postnasal drip). Decreased sense of smell and taste. Fatigue. A fever. Sore throat. Bad breath. How is this diagnosed? This condition is diagnosed based on: Your symptoms. Your medical history. A physical exam. Tests to find out if your condition is acute or chronic. This may include: Checking your nose for nasal polyps. Viewing your sinuses using a device that has a light (endoscope). Testing for allergies or bacteria. Imaging tests, such as an MRI or CT scan. In rare cases, a bone biopsy may be done to rule out more serious types of fungal sinus disease. How is this treated? Treatment for sinusitis depends on the cause and whether your condition is chronic or acute. If caused by a virus, your symptoms should go away on their own within 10 days. You may be given medicines to relieve symptoms. They include: Medicines that shrink swollen nasal passages (topical intranasal decongestants). Medicines that treat allergies (antihistamines). A spray that eases inflammation of the nostrils (topical intranasal corticosteroids). Rinses that help get rid of thick mucus in your nose (nasal saline washes). If caused by bacteria, your health care provider may recommend waiting to see if your symptoms improve. Most bacterial infections will get better without antibiotic medicine. You may be given antibiotics if you have: A severe infection. A weak immune system. If caused by narrow nasal  passages or nasal polyps, you may need to have surgery. Follow these instructions at home: Medicines Take, use, or apply over-the-counter and prescription medicines only as told by your health care provider. These may include nasal sprays. If you were prescribed an antibiotic medicine, take it as told by your health care provider. Do not stop taking the antibiotic even if you start to feel better. Hydrate and humidify  Drink enough fluid to keep your urine pale yellow. Staying hydrated will help to thin your mucus. Use a cool mist humidifier to keep the humidity level in your home above 50%. Inhale steam for 10-15 minutes, 3-4 times a day, or as told by your health care provider. You can do this in the bathroom while a hot shower is running. Limit your exposure to cool or dry air. Rest Rest as much as possible. Sleep with your head raised (elevated). Make sure you get enough sleep each night. General instructions  Apply a warm, moist washcloth to your face 3-4 times a day or as told by your health care provider. This will help with discomfort. Wash your hands often with soap and water to reduce your exposure to germs. If soap and water are not available, use hand sanitizer. Do not smoke. Avoid being around people who are smoking (secondhand smoke). Keep all follow-up visits as told by your health care provider. This is important. Contact a health care provider if: You have a fever. Your symptoms get worse. Your symptoms do not improve within 10 days. Get help right away if: You have a severe headache. You have persistent vomiting. You have  severe pain or swelling around your face or eyes. You have vision problems. You develop confusion. Your neck is stiff. You have trouble breathing. Summary Sinusitis is soreness and inflammation of your sinuses. Sinuses are hollow spaces in the bones around your face. This condition is caused by nasal tissues that become inflamed or swollen. The  swelling traps or blocks the flow of mucus. This allows bacteria, viruses, and fungi to grow, which leads to infection. If you were prescribed an antibiotic medicine, take it as told by your health care provider. Do not stop taking the antibiotic even if you start to feel better. Keep all follow-up visits as told by your health care provider. This is important. This information is not intended to replace advice given to you by your health care provider. Make sure you discuss any questions you have with your health care provider. Document Revised: 04/23/2018 Document Reviewed: 04/23/2018 Elsevier Patient Education  2022 [place].   Cough, Adult Coughing is a reflex that clears your throat and your airways (respiratory system). Coughing helps to heal and protect your lungs. It is normal to cough occasionally, but a cough that happens with other symptoms or lasts a long time may be a sign of a condition that needs treatment. An acute cough may only last 2-3 weeks, while a chronic cough may last 8 or more weeks. Coughing is commonly caused by: Infection of the respiratory systemby viruses or bacteria. Breathing in substances that irritate your lungs. Allergies. Asthma. Mucus that runs down the back of your throat (postnasal drip). Smoking. Acid backing up from the stomach into the esophagus (gastroesophageal reflux). Certain medicines. Chronic lung problems. Other medical conditions such as heart failure or a blood clot in the lung (pulmonary embolism). Follow these instructions at home: Medicines Take over-the-counter and prescription medicines only as told by your health care provider. Talk with your health care provider before you take a cough suppressant medicine. Lifestyle  Avoid cigarette smoke. Do not use any products that contain nicotine or tobacco, such as cigarettes, e-cigarettes, and chewing tobacco. If you need help quitting, ask your health care provider. Drink enough fluid  to keep your urine pale yellow. Avoid caffeine. Do not drink alcohol if your health care provider tells you not to drink. General instructions  Pay close attention to changes in your cough. Tell your health care provider about them. Always cover your mouth when you cough. Avoid things that make you cough, such as perfume, candles, cleaning products, or campfire or tobacco smoke. If the air is dry, use a cool mist vaporizer or humidifier in your bedroom or your home to help loosen secretions. If your cough is worse at night, try to sleep in a semi-upright position. Rest as needed. Keep all follow-up visits as told by your health care provider. This is important. Contact a health care provider if you: Have new symptoms. Cough up pus. Have a cough that does not get better after 2-3 weeks or gets worse. Cannot control your cough with cough suppressant medicines and you are losing sleep. Have pain that gets worse or pain that is not helped with medicine. Have a fever. Have unexplained weight loss. Have night sweats. Get help right away if: You cough up blood. You have difficulty breathing. Your heartbeat is very fast. These symptoms may represent a serious problem that is an emergency. Do not wait to see if the symptoms will go away. Get medical help right away. Call your local emergency services (911 in  the [place] Do not drive yourself to the hospital. Summary Coughing is a reflex that clears your throat and your airways. It is normal to cough occasionally, but a cough that happens with other symptoms or lasts a long time may be a sign of a condition that needs treatment. Take over-the-counter and prescription medicines only as told by your health care provider. Always cover your mouth when you cough. Contact a health care provider if you have new symptoms or a cough that does not get better after 2-3 weeks or gets worse. This information is not intended to replace advice given to you by  your health care provider. Make sure you discuss any questions you have with your health care provider. Document Revised: 12/10/2018 Document Reviewed: 12/10/2018 Elsevier Patient Education  2022 [place],   [name], MD [place] 12/04/21 2:01 PM

## 2021-12-04 ENCOUNTER — Encounter: Payer: Self-pay | Admitting: Family Medicine

## 2021-12-07 ENCOUNTER — Ambulatory Visit (INDEPENDENT_AMBULATORY_CARE_PROVIDER_SITE_OTHER)
Admission: RE | Admit: 2021-12-07 | Discharge: 2021-12-07 | Disposition: A | Discharge status: Home / Self Care | Payer: Medicare Other | Source: Ambulatory Visit | Attending: Family Medicine | Admitting: Family Medicine

## 2021-12-07 ENCOUNTER — Other Ambulatory Visit: Payer: Self-pay

## 2021-12-07 DIAGNOSIS — R059 Cough, unspecified: Secondary | ICD-10-CM

## 2021-12-13 ENCOUNTER — Ambulatory Visit (INDEPENDENT_AMBULATORY_CARE_PROVIDER_SITE_OTHER): Payer: Medicare Other | Admitting: Family Medicine

## 2021-12-13 VITALS — BP 136/74 | HR 72 | Temp 98.2°F | Ht 64.0 in | Wt 197.0 lb

## 2021-12-13 DIAGNOSIS — I10 Essential (primary) hypertension: Secondary | ICD-10-CM | POA: Diagnosis not present

## 2021-12-13 DIAGNOSIS — R059 Cough, unspecified: Secondary | ICD-10-CM

## 2021-12-13 DIAGNOSIS — R809 Proteinuria, unspecified: Secondary | ICD-10-CM | POA: Diagnosis not present

## 2021-12-13 DIAGNOSIS — E782 Mixed hyperlipidemia: Secondary | ICD-10-CM | POA: Diagnosis not present

## 2021-12-13 DIAGNOSIS — E1129 Type 2 diabetes mellitus with other diabetic kidney complication: Secondary | ICD-10-CM

## 2021-12-13 LAB — HEMOGLOBIN A1C: Hgb A1c MFr Bld: 8 % — ABNORMAL HIGH (ref 4.6–6.5)

## 2021-12-13 MED ORDER — AMLODIPINE BESYLATE 10 MG PO TABS: 10.0000 mg | ORAL_TABLET | Freq: Every day | ORAL | 1 refills | Status: DC

## 2021-12-13 MED ORDER — GLIPIZIDE 10 MG PO TABS: 20.0000 mg | ORAL_TABLET | Freq: Two times a day (BID) | ORAL | 1 refills | Status: DC

## 2021-12-13 MED ORDER — DAPAGLIFLOZIN PROPANEDIOL 10 MG PO TABS: 10.0000 mg | ORAL_TABLET | Freq: Every day | ORAL | 1 refills | Status: DC

## 2021-12-13 MED ORDER — ATORVASTATIN CALCIUM 20 MG PO TABS: ORAL_TABLET | ORAL | 1 refills | Status: DC

## 2021-12-13 NOTE — Progress Notes (Signed)
Subjective:  Patient ID: Susan Miles, female    DOB: September 03, 1946  Age: 75 y.o. MRN: [id number]  CC:  Chief Complaint  Patient presents with   Diabetes    Pt reports here for 3 month follow up no concerns feels well    Hypertension    Pt due for refill on amlodipine   Hyperlipidemia    Due for refill atorvastatin     HPI Susan Miles presents for   Cough: Office visit December 30.  Chest x-ray reassuring without evidence of acute cardiopulmonary process.  Treated with Phenergan DM, Tessalon, Augmentin.  10-day course - last dose yesterday.  Feeling better. Some DOE, but improving. Min cough. No fever.  Diabetes: With microalbuminuria, CKD, followed by nephrology.  Treated with Farxiga 10 mg daily, glipizide 20 mg twice daily, metformin 1000 mg twice daily Elevated but stable A1c last visit, plan for diet/exercise change. Has had mycotic genital infection but not repetitive. She is on statin, ARB. Januvia too costly in past.  Home readings:  Fasting 110, 117, highest 169, lowest 110, no sx lows.  No postprandials.  Last candidal infection 3 months ago.  Microalbumin: Ratio 63 on 12/15/2020 Optho, foot exam, pneumovax:  Optho - due for visit - she is scheduling. Exercise: riding bike in house - every morning at times - average 3 days per week 20-30 min, sometimes 2 times per day.   Lab Results  Component Value Date   HGBA1C 8.1 (H) 09/09/2021   HGBA1C 8.2 (H) 04/07/2021   HGBA1C 8.7 (H) 12/15/2020   Lab Results  Component Value Date   MICROALBUR 7.5 03/23/2016   LDLCALC 60 09/09/2021   CREATININE 0.99 09/09/2021   Wt Readings from Last 3 Encounters:  12/13/21 197 lb (89.4 kg)  12/03/21 194 lb 6.4 oz (88.2 kg)  09/09/21 196 lb 6.4 oz (89.1 kg)   Hypertension: Treated with amlodipine, clonidine, metoprolol Home readings:110-20/70-80 range.  Rare feeling off balance if standing quickly only, or in am. No focal weakness.  BP Readings from Last 3  Encounters:  12/13/21 136/74  12/03/21 132/70  09/09/21 128/74   Lab Results  Component Value Date   CREATININE 0.99 09/09/2021    Hyperlipidemia: Lipitor 20 mg daily with stable labs in October. Lab Results  Component Value Date   CHOL 116 09/09/2021   HDL 38.30 (L) 09/09/2021   LDLCALC 60 09/09/2021   TRIG 89.0 09/09/2021   CHOLHDL 3 09/09/2021   Lab Results  Component Value Date   ALT 26 09/09/2021   AST 27 09/09/2021   ALKPHOS 49 09/09/2021   BILITOT 0.4 09/09/2021      History Patient Active Problem List   Diagnosis Date Noted   Family history of breast cancer    Ductal carcinoma in situ (DCIS) of right breast 07/04/2019   Hyperlipidemia 12/21/2017   Breast asymmetry 05/01/2015   Cataract, nuclear 05/24/2012   HTN (hypertension) 02/10/2012   DM2 (diabetes mellitus, type 2) ([place]) 02/10/2012   Past Medical History:  Diagnosis Date   Breast cancer ([place])    Cancer ([place])    right breast cancer   Diabetes mellitus without complication ([place])    Family history of breast cancer    Hypertension    Personal history of radiation therapy    Sleep apnea    does not use CPAP   Past Surgical History:  Procedure Laterality Date   ABDOMINAL HYSTERECTOMY     BREAST LUMPECTOMY     BREAST LUMPECTOMY  WITH RADIOACTIVE SEED LOCALIZATION Right 08/09/2019   Procedure: RIGHT BREAST LUMPECTOMY X 2 WITH RADIOACTIVE SEED LOCALIZATION (3 SEEDS);  Surgeon: Alphonsa Overall, MD;  Location: [facility];  Service: General;  Laterality: Right;   Allergies  Allergen Reactions   Ace Inhibitors Cough   Prior to Admission medications   Medication Sig Start Date End Date Taking? Authorizing Provider  ACCU-CHEK AVIVA PLUS test strip USE TO TEST BLOOD SUGAR ONCE DAILY. DX E11.9 11/18/21   [name], MD  acetaminophen (TYLENOL) 500 MG tablet Take 500 mg by mouth every 6 (six) hours as needed for mild pain.    [provider]  amLODipine (NORVASC) 10 MG tablet  TAKE 1 TABLET BY MOUTH EVERY DAY 07/19/21   [name], MD  amoxicillin-clavulanate (AUGMENTIN) 875-125 MG tablet Take 1 tablet by mouth 2 (two) times daily. 12/03/21   [name], MD  aspirin 81 MG tablet Take 81 mg by mouth daily.    [provider]  atorvastatin (LIPITOR) 20 MG tablet TAKE 1 TABLET BY MOUTH DAILY AT 6 PM. 07/19/21   [name], MD  benzonatate (TESSALON) 100 MG capsule Take 1 capsule (100 mg total) by mouth 3 (three) times daily as needed for cough. 12/03/21   [name], MD  Blood Glucose Monitoring Suppl (LDR BLOOD GLUCOSE TRUETEST) w/Device KIT 1 Device by Does not apply route daily. Test blood sugar once daily. Dx: E11.9 05/13/20   [name], MD  cetirizine (ZYRTEC) 10 MG tablet TAKE 1 TABLET BY MOUTH EVERY DAY 08/24/21   [name], MD  cloNIDine (CATAPRES) 0.2 MG tablet Take 1 tablet (0.2 mg total) by mouth 2 (two) times daily. 09/09/21   [name], MD  FARXIGA 10 MG TABS tablet TAKE 1 TABLET BY MOUTH EVERY DAY 07/19/21   [name], MD  fluconazole (DIFLUCAN) 150 MG tablet Take 1 tablet (150 mg total) by mouth daily. 09/09/21   [name], MD  fluticasone (FLONASE) 50 MCG/ACT nasal spray Place 1-2 sprays into both nostrils daily. 04/02/18   [name], MD  glipiZIDE (GLUCOTROL) 10 MG tablet Take 2 tablets (20 mg total) by mouth 2 (two) times daily before a meal. 09/09/21   [name], MD  Lancets MISC Test blood sugar once daily. Dx: E11.9 01/24/18   [name], MD  losartan (COZAAR) 100 MG tablet Take 1 tablet (100 mg total) by mouth daily. 09/09/21   [name], MD  metFORMIN (GLUCOPHAGE) 1000 MG tablet Take 1 tablet (1,000 mg total) by mouth 2 (two) times daily with a meal. 09/09/21   [name], MD  metoprolol (TOPROL-XL) 200 MG 24 hr tablet Take 1 tablet (200 mg total) by mouth daily. 09/09/21   [name], MD  promethazine-dextromethorphan (PROMETHAZINE-DM) 6.25-15  MG/5ML syrup Take 2.5-5 mLs by mouth at bedtime as needed for cough. 12/03/21   [name], MD  tamoxifen (NOLVADEX) 20 MG tablet Take 1 tablet (20 mg total) by mouth daily. 07/06/21   Nicholas Lose, MD  triamcinolone cream (KENALOG) 0.1 % Apply 1 application topically 2 (two) times daily. 08/26/20   [name], MD   Social History   Socioeconomic History   Marital status: Married    Spouse name: Not on file   Number of children: Not on file   Years of education: Not on file   Highest education level: Not on file  Occupational History  Not on file  Tobacco Use   Smoking status: Never   Smokeless tobacco: Never  Substance and Sexual Activity   Alcohol use: No   Drug use: No   Sexual activity: Not on file  Other Topics Concern   Not on file  Social History Narrative   Not on file   Social Determinants of Health   Financial Resource Strain: Not on file  Food Insecurity: Not on file  Transportation Needs: Not on file  Physical Activity: Not on file  Stress: Not on file  Social Connections: Not on file  Intimate Partner Violence: Not on file    Review of Systems  Constitutional:  Negative for fatigue and unexpected weight change.  Respiratory:  Negative for chest tightness and shortness of breath.   Cardiovascular:  Negative for chest pain, palpitations and leg swelling.  Gastrointestinal:  Negative for abdominal pain and blood in stool.  Neurological:  Negative for dizziness, syncope, light-headedness and headaches.    Objective:   Vitals:   12/13/21 1333  BP: 136/74  Pulse: 72  Temp: 98.2 F (36.8 C)  TempSrc: Temporal  SpO2: 97%  Weight: 197 lb (89.4 kg)  Height: '5\' 4"'  (1.626 m)     Physical Exam Vitals reviewed.  Constitutional:      Appearance: Normal appearance. She is well-developed.  HENT:     Head: Normocephalic and atraumatic.  Eyes:     Conjunctiva/sclera: Conjunctivae normal.     Pupils: Pupils are equal, round, and reactive to  light.  Neck:     Vascular: No carotid bruit.  Cardiovascular:     Rate and Rhythm: Normal rate and regular rhythm.     Heart sounds: Normal heart sounds.  Pulmonary:     Effort: Pulmonary effort is normal.     Breath sounds: Normal breath sounds.  Abdominal:     Palpations: Abdomen is soft. There is no pulsatile mass.     Tenderness: There is no abdominal tenderness.  Musculoskeletal:     Right lower leg: No edema.     Left lower leg: No edema.  Skin:    General: Skin is warm and dry.  Neurological:     Mental Status: She is alert and oriented to person, place, and time.  Psychiatric:        Mood and Affect: Mood normal.        Behavior: Behavior normal.       Assessment & Plan:  Sammy Douthitt is a 76 y.o. female . Cough, unspecified type  -Improving, reassuring exam, RTC precautions if not resolving/worsening.   Type 2 diabetes mellitus with microalbuminuria, without long-term current use of insulin (HCC) - Plan: glipiZIDE (GLUCOTROL) 10 MG tablet, dapagliflozin propanediol (FARXIGA) 10 MG TABS tablet, Hemoglobin A1c  -Uncontrolled by prior A1c, recheck A1c, consider addition of DPP 4.  Cost has been an issue previously.  May need evaluation/referral with pharmacist to look into options.  No change in regimen for now as tolerating current meds.  Essential hypertension - Plan: amLODipine (NORVASC) 10 MG tablet  -Stable, continue same regimen.  33-monthfollow-up for fasting labs.  May have some component of orthostasis first in the morning or after prolonged sitting.  Slow rise after seated position discussed including in the morning when first waking up.  RTC precautions if persistent dizziness.  Mixed hyperlipidemia - Plan: atorvastatin (LIPITOR) 20 MG tablet, Hemoglobin A1c  -Tolerating statin, continue same.  36-monthollow-up fasting labs  Meds ordered this encounter  Medications  glipiZIDE (GLUCOTROL) 10 MG tablet    Sig: Take 2 tablets (20 mg total) by mouth 2  (two) times daily before a meal.    Dispense:  360 tablet    Refill:  1   dapagliflozin propanediol (FARXIGA) 10 MG TABS tablet    Sig: Take 1 tablet (10 mg total) by mouth daily.    Dispense:  90 tablet    Refill:  1   amLODipine (NORVASC) 10 MG tablet    Sig: Take 1 tablet (10 mg total) by mouth daily.    Dispense:  90 tablet    Refill:  1   atorvastatin (LIPITOR) 20 MG tablet    Sig: TAKE 1 TABLET BY MOUTH DAILY AT 6 PM.    Dispense:  90 tablet    Refill:  1   Patient Instructions  Schedule eye specialist visit but if difficulty in scheduling, let us knwo and we can look at retinopathy screening through our offices.   Cough should continue to improve - return if worsening or breathing is not returning to baseline.   If A1c is still elevated we can look at additional med. May need to coordinate with our pharmacist.  Keep up the good work with bike - goal of 126mns total per week.   Sit on edge of bed initially a few minutes in the morning, stand up slowly after prolonged sitting. If dizziness or lightheadedness continues, please return to discuss further.      Signed,   JMerri Ray MD [facility] 12/13/21 1:46 PM

## 2021-12-13 NOTE — Patient Instructions (Addendum)
Schedule eye specialist visit but if difficulty in scheduling, let us knwo and we can look at retinopathy screening through our offices.   Cough should continue to improve - return if worsening or breathing is not returning to baseline.   If A1c is still elevated we can look at additional med. May need to coordinate with our pharmacist.  Keep up the good work with bike - goal of 172mins total per week.   Sit on edge of bed initially a few minutes in the morning, stand up slowly after prolonged sitting. If dizziness or lightheadedness continues, please return to discuss further.

## 2021-12-14 ENCOUNTER — Other Ambulatory Visit: Payer: Self-pay | Admitting: Family Medicine

## 2021-12-14 ENCOUNTER — Encounter: Payer: Self-pay | Admitting: Family Medicine

## 2021-12-14 DIAGNOSIS — E1129 Type 2 diabetes mellitus with other diabetic kidney complication: Secondary | ICD-10-CM

## 2021-12-14 MED ORDER — SITAGLIPTIN PHOSPHATE 50 MG PO TABS: 50.0000 mg | ORAL_TABLET | Freq: Every day | ORAL | 1 refills | Status: DC

## 2021-12-14 NOTE — Progress Notes (Signed)
Januvia ordered - see lab notes.

## 2022-01-06 ENCOUNTER — Other Ambulatory Visit: Payer: Self-pay | Admitting: Family Medicine

## 2022-01-06 DIAGNOSIS — J309 Allergic rhinitis, unspecified: Secondary | ICD-10-CM

## 2022-01-13 NOTE — Telephone Encounter (Signed)
Erroneous encounter. Please disregard.

## 2022-02-09 DIAGNOSIS — Z139 Encounter for screening, unspecified: Secondary | ICD-10-CM

## 2022-02-12 LAB — GLUCOSE, POCT (MANUAL RESULT ENTRY): POC Glucose: 114 mg/dl — AB (ref 70–99)

## 2022-02-12 NOTE — Congregational Nurse Program (Signed)
  Dept: [phone]   Congregational Nurse Program Note  Date of Encounter: 02/09/2022  Past Medical History: Past Medical History:  Diagnosis Date   Breast cancer ([place])    Cancer ([place])    right breast cancer   Diabetes mellitus without complication ([place])    Family history of breast cancer    Hypertension    Personal history of radiation therapy    Sleep apnea    does not use CPAP    Encounter Details:  CNP Questionnaire - 02/09/22 1320       Questionnaire   Do you give verbal consent to treat you today? Yes    Location Patient Served  Not Applicable   [facility] or Organization    Patient Status Unknown    Insurance Unknown    Insurance Referral N/A    Medication N/A    Medical Provider Yes    Screening Referrals N/A    Medical Referral N/A    Medical Appointment Made N/A    Food N/A    Transportation N/A    Housing/Utilities N/A    Interpersonal Safety N/A    Intervention Blood pressure;Blood glucose;Case Management;Spiritual Care;Support;Educate    ED Visit Averted N/A    Life-Saving Intervention Made N/A             Vitals:   02/09/22 1320  BP: (!) 162/80  Pulse: 65  Resp: 18  Temp: (!) 97.5 F (36.4 C)  SpO2: 97%    Patient came into clinic to monitor vitals and blood glucose. Patient blood pressure was elevated. Patient does take medication for hypertension. Patient given education about hypertension. Patient stated she would recheck blood pressure at home. Patient denies any chest discomfort or pain. Suggested patient to keep a log of blood pressure and pulse at to follow up with her primary care provider. Discussed with patient about increasing mobility during the day and scheduling uninterrupted rest periods. Patient verbalized understanding of education.  Andrez Grime, BSN, RN, CRRN,CMSRN

## 2022-02-14 NOTE — Congregational Nurse Program (Signed)
°  Dept: [phone]   Congregational Nurse Program Note  Date of Encounter: 12/19/2021  Past Medical History: Past Medical History:  Diagnosis Date   Breast cancer ([place])    Cancer ([place])    right breast cancer   Diabetes mellitus without complication ([place])    Family history of breast cancer    Hypertension    Personal history of radiation therapy    Sleep apnea    does not use CPAP    Encounter Details:   Today's Vitals   12/19/21 1200  BP: (!) 178/82  Pulse: 77  Resp: 16  Temp: 99.4 F (37.4 C)  SpO2: 97%  Weight: 192 lb (87.1 kg)   Body mass index is 32.96 kg/m.  Patient temperature was slightly elevated. Patient had on several layers of clothes that could have contributed to the increase to body temperature. Patient blood pressure elevated. Patient denies any chest discomfort or any symptoms. Discussed with patient the importance of communicating with primary physicians about elevated blood pressure. Patient states she will contact her PCP for follow up.  Andrez Grime, BSN, RN, CRRN,CMSRN

## 2022-02-14 NOTE — Congregational Nurse Program (Signed)
?  Dept: [phone] ? ? ?Congregational Nurse Program Note ? ?Date of Encounter: 02/13/2022 ? ?Past Medical History: ?Past Medical History:  ?Diagnosis Date  ? Breast cancer ([place])   ? Cancer [facility])   ? right breast cancer  ? Diabetes mellitus without complication ([place])   ? Family history of breast cancer   ? Hypertension   ? Personal history of radiation therapy   ? Sleep apnea   ? does not use CPAP  ? ? ?Encounter Details: ? CNP Questionnaire - 02/13/22 1210   ? ?  ? Questionnaire  ? Do you give verbal consent to treat you today? Yes   ? Location Patient Served  Not Applicable   [facility]  ? Visit Setting [facility] or Organization   ? Patient Status Unknown   ? Insurance Unknown   ? Insurance Referral N/A   ? Medication N/A   ? Medical Provider Yes   ? Screening Referrals N/A   ? Medical Referral N/A   ? Medical Appointment Made N/A   ? Food N/A   ? Transportation N/A   ? Housing/Utilities N/A   ? Interpersonal Safety N/A   ? Intervention Blood pressure;Blood glucose;Case Management;Spiritual Care;Support;Educate   ? ED Visit Averted N/A   ? Life-Saving Intervention Made N/A   ? ?  ?  ? ?  ? ? ?Today's Vitals  ? 02/13/22 1210  ?BP: 128/78  ?Pulse: 75  ?Resp: 18  ?Temp: (!) 97.5 ?F (36.4 ?C)  ?TempSrc: Oral  ?SpO2: 97%  ?PainSc: 0-No pain  ? ?There is no height or weight on file to calculate BMI.  ? ?Patient seen in clinic. Patient was concerned about recent elevated blood pressures. Patient states she has been keeping a log of blood pressure since last visit with the congregational nurse. Patient states she has seen an increase in her energy level and has an upcoming appointment with her PCP. Patient states she will discuss with PCP recent blood pressure log and energy levels. Patient states she has increased physical activity. discussed the importance of reducing daily intake of sodium will help lower blood pressure. ? ?Andrez Grime, BSN, RN, CRRN,CMSRN ?  ?

## 2022-03-14 ENCOUNTER — Ambulatory Visit: Payer: Medicare Other | Admitting: Family Medicine

## 2022-04-07 ENCOUNTER — Ambulatory Visit (INDEPENDENT_AMBULATORY_CARE_PROVIDER_SITE_OTHER): Payer: Medicare Other | Admitting: Family Medicine

## 2022-04-07 VITALS — BP 140/74 | HR 87 | Temp 97.9°F | Resp 16 | Ht 64.0 in | Wt 197.2 lb

## 2022-04-07 DIAGNOSIS — R9431 Abnormal electrocardiogram [ECG] [EKG]: Secondary | ICD-10-CM

## 2022-04-07 DIAGNOSIS — E782 Mixed hyperlipidemia: Secondary | ICD-10-CM

## 2022-04-07 DIAGNOSIS — R5383 Other fatigue: Secondary | ICD-10-CM

## 2022-04-07 DIAGNOSIS — R809 Proteinuria, unspecified: Secondary | ICD-10-CM

## 2022-04-07 DIAGNOSIS — E1129 Type 2 diabetes mellitus with other diabetic kidney complication: Secondary | ICD-10-CM

## 2022-04-07 DIAGNOSIS — L03316 Cellulitis of umbilicus: Secondary | ICD-10-CM | POA: Diagnosis not present

## 2022-04-07 DIAGNOSIS — R600 Localized edema: Secondary | ICD-10-CM

## 2022-04-07 DIAGNOSIS — I10 Essential (primary) hypertension: Secondary | ICD-10-CM | POA: Diagnosis not present

## 2022-04-07 LAB — CBC
HCT: 40.1 % (ref 36.0–46.0)
Hemoglobin: 12.9 g/dL (ref 12.0–15.0)
MCHC: 32.3 g/dL (ref 30.0–36.0)
MCV: 87.1 fl (ref 78.0–100.0)
Platelets: 248 10*3/uL (ref 150.0–400.0)
RBC: 4.6 Mil/uL (ref 3.87–5.11)
RDW: 13.8 % (ref 11.5–15.5)
WBC: 4.4 10*3/uL (ref 4.0–10.5)

## 2022-04-07 LAB — COMPREHENSIVE METABOLIC PANEL
ALT: 20 U/L (ref 0–35)
AST: 21 U/L (ref 0–37)
Albumin: 3.9 g/dL (ref 3.5–5.2)
Alkaline Phosphatase: 52 U/L (ref 39–117)
BUN: 18 mg/dL (ref 6–23)
CO2: 27 mEq/L (ref 19–32)
Calcium: 10.5 mg/dL (ref 8.4–10.5)
Chloride: 105 mEq/L (ref 96–112)
Creatinine, Ser: 0.95 mg/dL (ref 0.40–1.20)
GFR: 58.68 mL/min — ABNORMAL LOW (ref >60.00)
Glucose, Bld: 117 mg/dL — ABNORMAL HIGH (ref 70–99)
Potassium: 4.2 mEq/L (ref 3.5–5.1)
Sodium: 139 mEq/L (ref 135–145)
Total Bilirubin: 0.4 mg/dL (ref 0.2–1.2)
Total Protein: 7.1 g/dL (ref 6.0–8.3)

## 2022-04-07 LAB — HEMOGLOBIN A1C: Hgb A1c MFr Bld: 7.2 % — ABNORMAL HIGH (ref 4.6–6.5)

## 2022-04-07 LAB — LIPID PANEL
Cholesterol: 132 mg/dL (ref 0–200)
HDL: 35.7 mg/dL — ABNORMAL LOW (ref >39.00)
LDL Cholesterol: 76 mg/dL (ref 0–99)
NonHDL: 96.26
Total CHOL/HDL Ratio: 4
Triglycerides: 102 mg/dL (ref 0.0–149.0)
VLDL: 20.4 mg/dL (ref 0.0–40.0)

## 2022-04-07 LAB — TSH: TSH: 2.15 u[IU]/mL (ref 0.35–5.50)

## 2022-04-07 LAB — MICROALBUMIN / CREATININE URINE RATIO
Creatinine,U: 81 mg/dL
Microalb Creat Ratio: 8.6 mg/g (ref 0.0–30.0)
Microalb, Ur: 7 mg/dL — ABNORMAL HIGH (ref 0.0–1.9)

## 2022-04-07 MED ORDER — DOXYCYCLINE HYCLATE 100 MG PO TABS: 100.0000 mg | ORAL_TABLET | Freq: Two times a day (BID) | ORAL | 0 refills | Status: DC

## 2022-04-07 NOTE — Progress Notes (Signed)
? ?Subjective:  ?Patient ID: Susan Miles, female    DOB: 27-Apr-1946  Age: 76 y.o. MRN: [id number] ? ?CC:  ?Chief Complaint  ?Patient presents with  ? Hyperlipidemia  ?  Due for recheck   ? Hypertension  ?  Due for recheck denies physical sxs   ? Diabetes  ?  Pt reports sugars are inconsistent, notes has not been missing doses 69,192 highest and lowest reading   ? ? ?HPI ?Susan Miles presents for above. ? ?Recurrent umbilicus infection: ?Recently started this week. Slight discharge blood with pus, soreness around navel, slight swelling few days ago. No fever. No abd pain, washed and applied neosporin. Better after expressing pus. Treated with abx few years ago for last flare.  ? ?Hypertension: ?With CKD followed by nephrology.  Treated with amlodipine, Toprol, losartan, clonidine.  Discussed prevention of orthostasis at her last visit.  Continued on same med regimen. ?Home readings: 140/70. Nephrology appt soon.  ?No chest pains.  ?Some swelling in legs past few months. Notes during day, improves overnight. ?No PND, orthopnea, or dyspnea.  ?No added salt to food. More takeout/restaurant food during winter months. Some increased fatigue since having breast CA and radiation. No palpitations.  ?No melena/hematochezia.  ? ?BP Readings from Last 3 Encounters:  ?04/07/22 140/74  ?02/13/22 128/78  ?02/09/22 (!) 162/80  ? ?Lab Results  ?Component Value Date  ? CREATININE 0.99 09/09/2021  ? ?Hyperlipidemia: ?Lipitor 20 mg daily, no new myaligas, SE.  ?Lab Results  ?Component Value Date  ? CHOL 116 09/09/2021  ? HDL 38.30 (L) 09/09/2021  ? [place] 60 09/09/2021  ? TRIG 89.0 09/09/2021  ? CHOLHDL 3 09/09/2021  ? ?Lab Results  ?Component Value Date  ? ALT 26 09/09/2021  ? AST 27 09/09/2021  ? ALKPHOS 49 09/09/2021  ? BILITOT 0.4 09/09/2021  ? ?Diabetes: ?With hyperglycemia, CKD, microalbuminuria, followed by nephrology.  Treated with Farxiga 10 mg daily, glipizide 20 mg twice daily, metformin 1000 mg twice  daily. ?A1c elevated last visit, Januvia ordered. ?[place] were costly - only taking Januvia. ?Microalbumin: Ratio of 63 on 12/15/2020. ?Variable readings - as low as 69, high of 192.  No missed meals. Only single low reading.  ?Optho, foot exam, pneumovax:  ?Ophthalmology: She planned to schedule at her January visit. ? ?Wt Readings from Last 3 Encounters:  ?04/07/22 197 lb 3.2 oz (89.4 kg)  ?12/19/21 192 lb (87.1 kg)  ?12/13/21 197 lb (89.4 kg)  ? ? ?Lab Results  ?Component Value Date  ? HGBA1C 8.0 (H) 12/13/2021  ? HGBA1C 8.1 (H) 09/09/2021  ? HGBA1C 8.2 (H) 04/07/2021  ? ?Lab Results  ?Component Value Date  ? MICROALBUR 7.5 03/23/2016  ? [place] 60 09/09/2021  ? CREATININE 0.99 09/09/2021  ? ? ?History ?Patient Active Problem List  ? Diagnosis Date Noted  ? Family history of breast cancer   ? Ductal carcinoma in situ (DCIS) of right breast 07/04/2019  ? Hyperlipidemia 12/21/2017  ? Breast asymmetry 05/01/2015  ? Cataract, nuclear 05/24/2012  ? HTN (hypertension) 02/10/2012  ? DM2 (diabetes mellitus, type 2) ([place]) 02/10/2012  ? ?Past Medical History:  ?Diagnosis Date  ? Breast cancer ([place])   ? Cancer [facility])   ? right breast cancer  ? Diabetes mellitus without complication ([place])   ? Family history of breast cancer   ? Hypertension   ? Personal history of radiation therapy   ? Sleep apnea   ? does not use CPAP  ? ?Past  Surgical History:  ?Procedure Laterality Date  ? ABDOMINAL HYSTERECTOMY    ? BREAST LUMPECTOMY    ? BREAST LUMPECTOMY WITH RADIOACTIVE SEED LOCALIZATION Right 08/09/2019  ? Procedure: RIGHT BREAST LUMPECTOMY X 2 WITH RADIOACTIVE SEED LOCALIZATION (3 SEEDS);  Surgeon: Alphonsa Overall, MD;  Location: [place];  Service: General;  Laterality: Right;  ? ?Allergies  ?Allergen Reactions  ? Ace Inhibitors Cough  ? ?Prior to Admission medications   ?Medication Sig Start Date End Date Taking? Authorizing Provider  ?ACCU-CHEK AVIVA PLUS test strip USE TO TEST BLOOD SUGAR ONCE DAILY. DX  E11.9 11/18/21  Yes [name], MD  ?acetaminophen (TYLENOL) 500 MG tablet Take 500 mg by mouth every 6 (six) hours as needed for mild pain.   Yes [provider]  ?amLODipine (NORVASC) 10 MG tablet Take 1 tablet (10 mg total) by mouth daily. 12/13/21  Yes [name], MD  ?aspirin 81 MG tablet Take 81 mg by mouth daily.   Yes [provider]  ?Blood Glucose Monitoring Suppl (LDR BLOOD GLUCOSE TRUETEST) w/Device KIT 1 Device by Does not apply route daily. Test blood sugar once daily. Dx: E11.9 05/13/20  Yes [name], MD  ?cetirizine (ZYRTEC) 10 MG tablet TAKE 1 TABLET BY MOUTH EVERY DAY 01/06/22  Yes [name], MD  ?cloNIDine (CATAPRES) 0.2 MG tablet Take 1 tablet (0.2 mg total) by mouth 2 (two) times daily. 09/09/21  Yes [name], MD  ?fluticasone (FLONASE) 50 MCG/ACT nasal spray Place 1-2 sprays into both nostrils daily. 04/02/18  Yes [name], MD  ?Lancets MISC Test blood sugar once daily. Dx: E11.9 01/24/18  Yes [name], MD  ?losartan (COZAAR) 100 MG tablet Take 1 tablet (100 mg total) by mouth daily. 09/09/21  Yes [name], MD  ?metFORMIN (GLUCOPHAGE) 1000 MG tablet Take 1 tablet (1,000 mg total) by mouth 2 (two) times daily with a meal. 09/09/21  Yes [name], MD  ?metoprolol (TOPROL-XL) 200 MG 24 hr tablet Take 1 tablet (200 mg total) by mouth daily. 09/09/21  Yes [name], MD  ?sitaGLIPtin (JANUVIA) 50 MG tablet Take 1 tablet (50 mg total) by mouth daily. 12/14/21  Yes [name], MD  ?amoxicillin-clavulanate (AUGMENTIN) 875-125 MG tablet Take 1 tablet by mouth 2 (two) times daily. ?Patient not taking: Reported on 04/07/2022 12/03/21   [name], MD  ?atorvastatin (LIPITOR) 20 MG tablet TAKE 1 TABLET BY MOUTH DAILY AT 6 PM. 12/13/21   [name], MD  ?benzonatate (TESSALON) 100 MG capsule Take 1 capsule (100 mg total) by mouth 3 (three) times daily as needed for cough. ?Patient not taking: Reported on  04/07/2022 12/03/21   [name], MD  ?dapagliflozin propanediol (FARXIGA) 10 MG TABS tablet Take 1 tablet (10 mg total) by mouth daily. 12/13/21   [name], MD  ?fluconazole (DIFLUCAN) 150 MG tablet Take 1 tablet (150 mg total) by mouth daily. ?Patient not taking: Reported on 04/07/2022 09/09/21   [name], MD  ?glipiZIDE (GLUCOTROL) 10 MG tablet Take 2 tablets (20 mg total) by mouth 2 (two) times daily before a meal. 12/13/21   [name], MD  ?promethazine-dextromethorphan (PROMETHAZINE-DM) 6.25-15 MG/5ML syrup Take 2.5-5 mLs by mouth at bedtime as needed for cough. ?Patient not taking: Reported on 04/07/2022 12/03/21   [name], MD  ?tamoxifen (NOLVADEX) 20 MG tablet Take 1 tablet (20 mg total) by mouth daily. ?Patient not  taking: Reported on 04/07/2022 07/06/21   Nicholas Lose, MD  ?triamcinolone cream (KENALOG) 0.1 % Apply 1 application topically 2 (two) times daily. ?Patient not taking: Reported on 04/07/2022 08/26/20   [name], MD  ? ?Social History  ? ?Socioeconomic History  ? Marital status: Married  ?  Spouse name: Not on file  ? Number of children: Not on file  ? Years of education: Not on file  ? Highest education level: Not on file  ?Occupational History  ? Not on file  ?Tobacco Use  ? Smoking status: Never  ? Smokeless tobacco: Never  ?Substance and Sexual Activity  ? Alcohol use: No  ? Drug use: No  ? Sexual activity: Not on file  ?Other Topics Concern  ? Not on file  ?Social History Narrative  ? Not on file  ? ?Social Determinants of Health  ? ?Financial Resource Strain: Not on file  ?Food Insecurity: Not on file  ?Transportation Needs: Not on file  ?Physical Activity: Not on file  ?Stress: Not on file  ?Social Connections: Not on file  ?Intimate Partner Violence: Not on file  ? ? ?Review of Systems  ?Constitutional:  Positive for fatigue. Negative for unexpected weight change.  ?Respiratory:  Negative for chest tightness and shortness of breath.   ?Cardiovascular:   Positive for leg swelling. Negative for chest pain and palpitations.  ?Gastrointestinal:  Negative for abdominal pain and blood in stool.  ?Neurological:  Negative for dizziness, syncope, light-headedness and he

## 2022-04-07 NOTE — Patient Instructions (Addendum)
Be careful with restaurant/take out food as more sodium possible that may worsen swelling.  ?If swelling not improving with more home meals, discuss with your nephrologist.  ? ?Follow up with home readings (fasting and 2 hours after meals) in next 2 weeks. We can look at med changes then. Variations in blood sugar may cause fatigue as well as prior cancer treatment, but will also refer you to cardiology.  EKG looks similar to the previous EKG but may have some slight abnormalities.  I would like the cardiologist to look at this further.  ?Return to the clinic or go to the nearest emergency room if any of your symptoms worsen or new symptoms occur.no med changes for now.  ? ?Fatigue ?If you have fatigue, you feel tired all the time and have a lack of energy or a lack of motivation. Fatigue may make it difficult to start or complete tasks because of exhaustion. ?Occasional or mild fatigue is often a normal response to activity or life. However, long-term (chronic) or extreme fatigue may be a symptom of a medical condition such as: ?Depression. ?Not having enough red blood cells or hemoglobin in the blood (anemia). ?A problem with a small gland located in the lower front part of the neck (thyroid disorder). ?Rheumatologic conditions. These are problems related to the body's defense system (immune system). ?Infections, especially certain viral infections. ?Fatigue can also lead to negative health outcomes over time. ?Follow these instructions at home: ?Medicines ?Take over-the-counter and prescription medicines only as told by your health care provider. ?Take a multivitamin if told by your health care provider. ?Do not use herbal or dietary supplements unless they are approved by your health care provider. ?Eating and drinking ? ?Avoid heavy meals in the evening. ?Eat a well-balanced diet, which includes lean proteins, whole grains, plenty of fruits and vegetables, and low-fat dairy products. ?Avoid eating or drinking  too many products with caffeine in them. ?Avoid alcohol. ?Drink enough fluid to keep your urine pale yellow. ?Activity ? ?Exercise regularly, as told by your health care provider. ?Use or practice techniques to help you relax, such as yoga, tai chi, meditation, or massage therapy. ?Lifestyle ?Change situations that cause you stress. Try to keep your work and personal schedules in balance. ?Do not use recreational or illegal drugs. ?General instructions ?Monitor your fatigue for any changes. ?Go to bed and get up at the same time every day. ?Avoid fatigue by pacing yourself during the day and getting enough sleep at night. ?Maintain a healthy weight. ?Contact a health care provider if: ?Your fatigue does not get better. ?You have a fever. ?You suddenly lose or gain weight. ?You have headaches. ?You have trouble falling asleep or sleeping through the night. ?You feel angry, guilty, anxious, or sad. ?You have swelling in your legs or another part of your body. ?Get help right away if: ?You feel confused, feel like you might faint, or faint. ?Your vision is blurry or you have a severe headache. ?You have severe pain in your abdomen, your back, or the area between your waist and hips (pelvis). ?You have chest pain, shortness of breath, or an irregular or fast heartbeat. ?You are unable to urinate, or you urinate less than normal. ?You have abnormal bleeding from the rectum, nose, lungs, nipples, or, if you are female, the vagina. ?You vomit blood. ?You have thoughts about hurting yourself or others. ?These symptoms may be an emergency. Get help right away. Call 911. ?Do not wait to see if  the symptoms will go away. ?Do not drive yourself to the hospital. ?Get help right away if you feel like you may hurt yourself or others, or have thoughts about taking your own life. Go to your nearest emergency room or: ?Call 911. ?Call the [place] at [phone] or [phone]. This is open 24 hours a  day. ?Text the Crisis Text Line at 850-357-8410. ?Summary ?If you have fatigue, you feel tired all the time and have a lack of energy or a lack of motivation. ?Fatigue may make it difficult to start or complete tasks because of exhaustion. ?Long-term (chronic) or extreme fatigue may be a symptom of a medical condition. ?Exercise regularly, as told by your health care provider. ?Change situations that cause you stress. Try to keep your work and personal schedules in balance. ?This information is not intended to replace advice given to you by your health care provider. Make sure you discuss any questions you have with your health care provider. ?Document Revised: 09/13/2021 Document Reviewed: 09/13/2021 ?Elsevier Patient Education ? [place]. ? ? ? ? ? ? ?

## 2022-04-09 ENCOUNTER — Encounter: Payer: Self-pay | Admitting: Family Medicine

## 2022-04-11 ENCOUNTER — Other Ambulatory Visit: Payer: Self-pay | Admitting: Family Medicine

## 2022-04-11 DIAGNOSIS — J309 Allergic rhinitis, unspecified: Secondary | ICD-10-CM

## 2022-04-12 ENCOUNTER — Ambulatory Visit: Payer: Medicare Other | Admitting: Internal Medicine

## 2022-04-12 ENCOUNTER — Encounter: Payer: Self-pay | Admitting: Internal Medicine

## 2022-04-12 VITALS — BP 130/72 | HR 86 | Ht 64.0 in | Wt 197.8 lb

## 2022-04-12 DIAGNOSIS — R9431 Abnormal electrocardiogram [ECG] [EKG]: Secondary | ICD-10-CM

## 2022-04-12 NOTE — Patient Instructions (Addendum)
It was a pleasure meeting you today. ?As we discussed, I recommend trying to wear your CPAP to help with your fatigue ?If you have persistent swelling in the ankles at the end of the day can start to wear your compression stockings ?Signs of heart disease include shortness of breath on exertion and/or chest pressure, if this occurs seek medical attention ? ?Medication Instructions:  ?Your physician recommends that you continue on your current medications as directed. Please refer to the Current Medication list given to you today. ? ?Follow-Up: ?At [facility], you and your health needs are our priority.  As part of our continuing mission to provide you with exceptional heart care, we have created designated Provider Care Teams.  These Care Teams include your primary Cardiologist (physician) and Advanced Practice Providers (APPs -  Physician Assistants and Nurse Practitioners) who all work together to provide you with the care you need, when you need it. ? ?We recommend signing up for the patient portal called "MyChart".  Sign up information is provided on this After Visit Summary.  MyChart is used to connect with patients for Virtual Visits (Telemedicine).  Patients are able to view lab/test results, encounter notes, upcoming appointments, etc.  Non-urgent messages can be sent to your provider as well.   ?To learn more about what you can do with MyChart, go to [email or web address].   ? ?Your next appointment:   ?AS NEEDED with Dr. Harl Bowie ? ? ?Important Information About Sugar ? ? ? ? ? ? ?

## 2022-04-12 NOTE — Progress Notes (Signed)
?Cardiology Office Note:   ? ?Date:  04/12/2022  ? ?ID:  Susan Miles, DOB 09/15/1946, MRN [id number] ? ?PCP:  Greene, Jeffrey R, MD ?  ?[facility] Providers ?Cardiologist:  None    ? ?Referring MD: Greene, Jeffrey R, MD  ? ?No chief complaint on file. ?Abnormal EKG ? ?History of Present Illness:   ? ?Susan Miles is a 76 y.o. female with a hx of right breast cancer, sleep apnea, DM2, HLD, HTN and CKD (planning to see nephrology), referral for abnormality on EKG ? ?Her feet and ankles swell. This has been going on for 6 months.  LE edema progresses during the day. She's been on norvasc for a long time.  She denies orthopnea, PND. She denies SOB or chest pressure.  She has does not where CPAP. Blood pressures are in a good control. ? ?Her sister died of CHF and another sister with MI. ? ?Diagnosed with ER/PR + R BC July 2020. Stage 0. Low grade DCIS. 42 Gy on the R. She is s/p lumpectomy.Takes tamoxifen daily ? ?Prior EKG showed sinus rhythm with LVH, poor r wave progression. ? ?Past Medical History:  ?Diagnosis Date  ? Breast cancer ([facility])   ? Cancer ([facility])   ? right breast cancer  ? Diabetes mellitus without complication ([facility])   ? Family history of breast cancer   ? Hypertension   ? Personal history of radiation therapy   ? Sleep apnea   ? does not use CPAP  ? ? ?Past Surgical History:  ?Procedure Laterality Date  ? ABDOMINAL HYSTERECTOMY    ? BREAST LUMPECTOMY    ? BREAST LUMPECTOMY WITH RADIOACTIVE SEED LOCALIZATION Right 08/09/2019  ? Procedure: RIGHT BREAST LUMPECTOMY X 2 WITH RADIOACTIVE SEED LOCALIZATION (3 SEEDS);  Surgeon: Newman, David, MD;  Location: [facility];  Service: General;  Laterality: Right;  ? ? ?Current Medications: ?Current Outpatient Medications on File Prior to Visit  ?Medication Sig Dispense Refill  ? ACCU-CHEK AVIVA PLUS test strip USE TO TEST BLOOD SUGAR ONCE DAILY. DX E11.9 100 strip 2  ? acetaminophen (TYLENOL) 500 MG tablet Take 500 mg by mouth every 6 (six)  hours as needed for mild pain.    ? amLODipine (NORVASC) 10 MG tablet Take 1 tablet (10 mg total) by mouth daily. 90 tablet 1  ? aspirin 81 MG tablet Take 81 mg by mouth daily.    ? atorvastatin (LIPITOR) 20 MG tablet TAKE 1 TABLET BY MOUTH DAILY AT 6 PM. 90 tablet 1  ? Blood Glucose Monitoring Suppl (LDR BLOOD GLUCOSE TRUETEST) w/Device KIT 1 Device by Does not apply route daily. Test blood sugar once daily. Dx: E11.9 1 kit 0  ? cetirizine (ZYRTEC) 10 MG tablet TAKE 1 TABLET BY MOUTH EVERY DAY 90 tablet 0  ? cloNIDine (CATAPRES) 0.2 MG tablet Take 1 tablet (0.2 mg total) by mouth 2 (two) times daily. 180 tablet 1  ? doxycycline (VIBRA-TABS) 100 MG tablet Take 1 tablet (100 mg total) by mouth 2 (two) times daily. 20 tablet 0  ? fluconazole (DIFLUCAN) 150 MG tablet Take 1 tablet (150 mg total) by mouth daily. 1 tablet 0  ? fluticasone (FLONASE) 50 MCG/ACT nasal spray Place 1-2 sprays into both nostrils daily. 16 g 6  ? glipiZIDE (GLUCOTROL) 10 MG tablet Take 2 tablets (20 mg total) by mouth 2 (two) times daily before a meal. 360 tablet 1  ? Lancets MISC Test blood sugar once daily. Dx: E11.9 100 each 3  ?   losartan (COZAAR) 100 MG tablet Take 1 tablet (100 mg total) by mouth daily. 90 tablet 1  ? metFORMIN (GLUCOPHAGE) 1000 MG tablet Take 1 tablet (1,000 mg total) by mouth 2 (two) times daily with a meal. 180 tablet 1  ? metoprolol (TOPROL-XL) 200 MG 24 hr tablet Take 1 tablet (200 mg total) by mouth daily. 90 tablet 1  ? promethazine-dextromethorphan (PROMETHAZINE-DM) 6.25-15 MG/5ML syrup Take 2.5-5 mLs by mouth at bedtime as needed for cough. 118 mL 0  ? sitaGLIPtin (JANUVIA) 50 MG tablet Take 1 tablet (50 mg total) by mouth daily. 90 tablet 1  ? tamoxifen (NOLVADEX) 20 MG tablet Take 1 tablet (20 mg total) by mouth daily. 90 tablet 3  ? triamcinolone cream (KENALOG) 0.1 % Apply 1 application topically 2 (two) times daily. 30 g 1  ? ?No current facility-administered medications on file prior to visit.   ? ? ? ?Allergies:   Ace inhibitors  ? ?Social History  ? ?Socioeconomic History  ? Marital status: Married  ?  Spouse name: Not on file  ? Number of children: Not on file  ? Years of education: Not on file  ? Highest education level: Not on file  ?Occupational History  ? Not on file  ?Tobacco Use  ? Smoking status: Never  ? Smokeless tobacco: Never  ?Substance and Sexual Activity  ? Alcohol use: No  ? Drug use: No  ? Sexual activity: Not on file  ?Other Topics Concern  ? Not on file  ?Social History Narrative  ? Not on file  ? ?Social Determinants of Health  ? ?Financial Resource Strain: Not on file  ?Food Insecurity: Not on file  ?Transportation Needs: Not on file  ?Physical Activity: Not on file  ?Stress: Not on file  ?Social Connections: Not on file  ?  ? ?Family History: ?The patient's family history includes Breast cancer in her sister and sister; Heart disease in her father; Hypertension in her mother. ? ?ROS:   ?Please see the history of present illness.    ? All other systems reviewed and are negative. ? ?EKGs/Labs/Other Studies Reviewed:   ? ?The following studies were reviewed today: ? ? ?EKG:  EKG  ordered today.  The ekg ordered today demonstrates  ? ?04/12/2022-NSR, LV[facility] ? ?Recent Labs: ?04/07/2022: ALT 20; BUN 18; Creatinine, Ser 0.95; Hemoglobin 12.9; Platelets 248.0; Potassium 4.2; Sodium 139; TSH 2.15  ? ? ?Recent Lipid Panel ?   ?Component Value Date/Time  ? CHOL 132 04/07/2022 1156  ? CHOL 122 12/15/2020 1111  ? TRIG 102.0 04/07/2022 1156  ? HDL 35.70 (L) 04/07/2022 1156  ? HDL 38 (L) 12/15/2020 1111  ? CHOLHDL 4 04/07/2022 1156  ? VLDL 20.4 04/07/2022 1156  ? [place] 76 04/07/2022 1156  ? [place] 69 12/15/2020 1111  ? ? ? ?Risk Assessment/Calculations:   ?  ? ?    ? ?Physical Exam:   ? ?VS: ? ?Vitals:  ? 04/12/22 1003  ?BP: 130/72  ?Pulse: 86  ?SpO2: 97%  ? ? ? ?Wt Readings from Last 3 Encounters:  ?04/12/22 197 lb 12.8 oz (89.7 kg)  ?04/07/22 197 lb 3.2 oz (89.4 kg)  ?12/19/21 192 lb (87.1 kg)  ?   ? ?GEN:  Well nourished, well developed in no acute distress ?HEENT: Normal ?NECK: No JVD; No carotid bruits ?LYMPHATICS: No lymphadenopathy ?CARDIAC: RRR, no murmurs, rubs, gallops ?RESPIRATORY:  Clear to auscultation without rales, wheezing or rhonchi  ?ABDOMEN: Soft, non-tender, non-distended ?MUSCULOSKELETAL:  No edema; No  deformity  ?SKIN: Warm and dry ?NEUROLOGIC:  Alert and oriented x 3 ?PSYCHIATRIC:  Normal affect  ? ?ASSESSMENT:   ? ?#Abnormal EKG: poor r wave could have been related to lead placement. Risk for CVD is not very high. We discussed swelling in her ankles likely related to lymphadema. She has no signs of ischemia ? ?#CardiacOnc: right sided XRT low risk of valve fibrosis or atherosclerosis. No hx of cardiotoxic chemotherapy ? ?PLAN:   ? ?In order of problems listed above: ? ?Follow up as needed ? ?   ? ?   ? ? ?Medication Adjustments/Labs and Tests Ordered: ?Current medicines are reviewed at length with the patient today.  Concerns regarding medicines are outlined above.  ?No orders of the defined types were placed in this encounter. ? ?No orders of the defined types were placed in this encounter. ? ? ?Patient Instructions  ?It was a pleasure meeting you today. ?As we discussed, I recommend trying to wear your CPAP to help with your fatigue ?If you have persistent swelling in the ankles at the end of the day can start to wear your compression stockings ?Signs of heart disease include shortness of breath on exertion and/or chest pressure, if this occurs seek medical attention ? ?Medication Instructions:  ?Your physician recommends that you continue on your current medications as directed. Please refer to the Current Medication list given to you today. ? ?Follow-Up: ?At [facility], you and your health needs are our priority.  As part of our continuing mission to provide you with exceptional heart care, we have created designated Provider Care Teams.  These Care Teams include your primary  Cardiologist (physician) and Advanced Practice Providers (APPs -  Physician Assistants and Nurse Practitioners) who all work together to provide you with the care you need, when you need it. ? ?We recommend signing up for t

## 2022-04-21 ENCOUNTER — Ambulatory Visit (INDEPENDENT_AMBULATORY_CARE_PROVIDER_SITE_OTHER): Payer: Medicare Other | Admitting: Family Medicine

## 2022-04-21 ENCOUNTER — Encounter: Payer: Self-pay | Admitting: Family Medicine

## 2022-04-21 VITALS — BP 138/78 | HR 79 | Temp 98.2°F | Resp 15 | Ht 64.0 in | Wt 198.2 lb

## 2022-04-21 DIAGNOSIS — R600 Localized edema: Secondary | ICD-10-CM

## 2022-04-21 DIAGNOSIS — E1129 Type 2 diabetes mellitus with other diabetic kidney complication: Secondary | ICD-10-CM

## 2022-04-21 DIAGNOSIS — R809 Proteinuria, unspecified: Secondary | ICD-10-CM

## 2022-04-21 DIAGNOSIS — R5383 Other fatigue: Secondary | ICD-10-CM

## 2022-04-21 NOTE — Progress Notes (Signed)
Subjective:  Patient ID: Susan Miles, female    DOB: 11-04-1946  Age: 76 y.o. MRN: [id number]  CC:  Chief Complaint  Patient presents with   Diabetes    Pt here for recheck reports doing well, did bring home readings today, 136, 141, 112, 108, 135    HPI [name] presents for  Diabetes: With microalbuminuria, CKD, prior hyperglycemia.  A1c was improved at May 4 visit.  Unfortunately Farxiga costly.  Treated with glipizide 20 mg twice daily, metformin 1000 mg twice daily, ajnuvia 51m qd.  Home readings as above, 136, 141, 112, 108, 135.  No symptomatic lows. Taking lipitor qd, and ARB - losartan.   Lab Results  Component Value Date   HGBA1C 7.2 (H) 04/07/2022   HGBA1C 8.0 (H) 12/13/2021   HGBA1C 8.1 (H) 09/09/2021   Lab Results  Component Value Date   MICROALBUR 7.0 (H) 04/07/2022   LDLCALC 76 04/07/2022   CREATININE 0.95 04/07/2022   Pedal edema with fatigue Discussed at May 4 visit.  Possibly multifactorial including variable glycemic control.  Few borderline abnormalities on EKG and was referred to cardiology.  Decrease sodium recommended with edema, likely contributing from restaurant/takeout food. About the same swelling - trying to cut back on takeout/restaurant food.  Cardiology visit last week - Dr. BHarl Bowie Possible lymphedema, no signs of ischemia. Follow up as needed. Prior R wave may have been due to lead placement.  Fatigue improving, no CP.   Wt Readings from Last 3 Encounters:  04/21/22 198 lb 3.2 oz (89.9 kg)  04/12/22 197 lb 12.8 oz (89.7 kg)  04/07/22 197 lb 3.2 oz (89.4 kg)    History Patient Active Problem List   Diagnosis Date Noted   Family history of breast cancer    Ductal carcinoma in situ (DCIS) of right breast 07/04/2019   Hyperlipidemia 12/21/2017   Breast asymmetry 05/01/2015   Cataract, nuclear 05/24/2012   HTN (hypertension) 02/10/2012   DM2 (diabetes mellitus, type 2) ([place] 02/10/2012   Past Medical History:   Diagnosis Date   [place]    [place]    right breast cancer   Di[place] mellitus without complication ([place]    Family history of breast cancer    Hypertension    Personal history of radiation therapy    Sleep apnea    does not use CPAP   Past Surgical History:  Procedure Laterality Date   ABDOMINAL HYSTERECTOMY     BREAST LUMPECTOMY     BREAST LUMPECTOMY WITH RADIOACTIVE SEED LOCALIZATION Right 08/09/2019   Procedure: RIGHT BREAST LUMPECTOMY X 2 WITH RADIOACTIVE SEED LOCALIZATION (3 SEEDS);  Surgeon: NAlphonsa Overall MD;  [facility] [facility]  Service: General;  Laterality: Right;   Allergies  Allergen Reactions   Ace Inhibitors Cough   Prior to Admission medications   Medication Sig Start Date End Date Taking? Authorizing Provider  ACCU-CHEK AVIVA PLUS test strip USE TO TEST BLOOD SUGAR ONCE DAILY. DX E11.9 11/18/21  Yes GWendie Agreste MD  acetaminophen (TYLENOL) 500 MG tablet Take 500 mg by mouth every 6 (six) hours as needed for mild pain.   Yes [provider]  amLODipine (NORVASC) 10 MG tablet Take 1 tablet (10 mg total) by mouth daily. 12/13/21  Yes GWendie Agreste MD  aspirin 81 MG tablet Take 81 mg by mouth daily.   Yes [provider]  Blood Glucose Monitoring Suppl (LDR BLOOD GLUCOSE TRUETEST) w/Device KIT 1 Device by Does  not apply route daily. Test blood sugar once daily. Dx: E11.9 05/13/20  Yes [name], MD  cetirizine (ZYRTEC) 10 MG tablet TAKE 1 TABLET BY MOUTH EVERY DAY 04/11/22  Yes [name], MD  cloNIDine (CATAPRES) 0.2 MG tablet Take 1 tablet (0.2 mg total) by mouth 2 (two) times daily. 09/09/21  Yes [name], MD  doxycycline (VIBRA-TABS) 100 MG tablet Take 1 tablet (100 mg total) by mouth 2 (two) times daily. 04/07/22  Yes [name], MD  fluconazole (DIFLUCAN) 150 MG tablet Take 1 tablet (150 mg total) by mouth daily. 09/09/21  Yes [name], MD  fluticasone (FLONASE) 50 MCG/ACT  nasal spray Place 1-2 sprays into both nostrils daily. 04/02/18  Yes [name], MD  Lancets MISC Test blood sugar once daily. Dx: E11.9 01/24/18  Yes [name], MD  losartan (COZAAR) 100 MG tablet Take 1 tablet (100 mg total) by mouth daily. 09/09/21  Yes [name], MD  metFORMIN (GLUCOPHAGE) 1000 MG tablet Take 1 tablet (1,000 mg total) by mouth 2 (two) times daily with a meal. 09/09/21  Yes [name], MD  metoprolol (TOPROL-XL) 200 MG 24 hr tablet Take 1 tablet (200 mg total) by mouth daily. 09/09/21  Yes [name], MD  promethazine-dextromethorphan (PROMETHAZINE-DM) 6.25-15 MG/5ML syrup Take 2.5-5 mLs by mouth at bedtime as needed for cough. 12/03/21  Yes [name], MD  sitaGLIPtin (JANUVIA) 50 MG tablet Take 1 tablet (50 mg total) by mouth daily. 12/14/21  Yes [name], MD  tamoxifen (NOLVADEX) 20 MG tablet Take 1 tablet (20 mg total) by mouth daily. 07/06/21  Yes Nicholas Lose, MD  triamcinolone cream (KENALOG) 0.1 % Apply 1 application topically 2 (two) times daily. 08/26/20  Yes [name], MD  atorvastatin (LIPITOR) 20 MG tablet TAKE 1 TABLET BY MOUTH DAILY AT 6 PM. 12/13/21   [name], MD  glipiZIDE (GLUCOTROL) 10 MG tablet Take 2 tablets (20 mg total) by mouth 2 (two) times daily before a meal. 12/13/21   [name], MD   Social History   Socioeconomic History   Marital status: Married    Spouse name: Not on file   Number of children: Not on file   Years of education: Not on file   Highest education level: Not on file  Occupational History   Not on file  Tobacco Use   Smoking status: Never   Smokeless tobacco: Never  Substance and Sexual Activity   Alcohol use: No   Drug use: No   Sexual activity: Not on file  Other Topics Concern   Not on file  Social History Narrative   Not on file   Social Determinants of Health   Financial Resource Strain: Not on file  Food Insecurity: Not on file  Transportation  Needs: Not on file  Physical Activity: Not on file  Stress: Not on file  Social Connections: Not on file  Intimate Partner Violence: Not on file    Review of Systems  Constitutional:  Negative for fatigue and unexpected weight change.  Respiratory:  Negative for chest tightness and shortness of breath.   Cardiovascular:  Negative for chest pain, palpitations and leg swelling.  Gastrointestinal:  Negative for abdominal pain and blood in stool.  Neurological:  Negative for dizziness, syncope, light-headedness and headaches.    Objective:   Vitals:   04/21/22 1020  BP: 138/78  Pulse: 79  Resp: 15  Temp:  98.2 F (36.8 C)  TempSrc: Temporal  SpO2: 97%  Weight: 198 lb 3.2 oz (89.9 kg)  Height: '5\' 4"'  (1.626 m)     Physical Exam Vitals reviewed.  Constitutional:      Appearance: Normal appearance. She is well-developed.  HENT:     Head: Normocephalic and atraumatic.  Eyes:     Conjunctiva/sclera: Conjunctivae normal.     Pupils: Pupils are equal, round, and reactive to light.  Neck:     Vascular: No carotid bruit.  Cardiovascular:     Rate and Rhythm: Normal rate and regular rhythm.     Heart sounds: Normal heart sounds.  Pulmonary:     Effort: Pulmonary effort is normal.     Breath sounds: Normal breath sounds.  Abdominal:     Palpations: Abdomen is soft. There is no pulsatile mass.     Tenderness: There is no abdominal tenderness.  Musculoskeletal:     Right lower leg: Edema (Trace at ankle only) present.     Left lower leg: No edema.  Skin:    General: Skin is warm and dry.  Neurological:     Mental Status: She is alert and oriented to person, place, and time.  Psychiatric:        Mood and Affect: Mood normal.        Behavior: Behavior normal.       Assessment & Plan:  Larenda Reedy is a 76 y.o. female . Type 2 diabetes mellitus with microalbuminuria, without long-term current use of insulin (HCC)  -Near goal at 7.2, improved from previous readings.   With cost difficulty of SGLT2 we will hold on changes based on those improved readings.  Recheck in 3 months.  At that time if additional meds needed may need to coordinate with pharmacist to see if assistance programs available.  RTC precautions of higher readings in the interim.  Other labs reviewed with patient from last visit.  All questions answered.  Pedal edema  -Edema versus lymphedema.  Continue to monitor sodium intake, improved diet.  Exercise with walking, leg elevation, RTC precautions if increasing swelling.  Appears euvolemic.  Fatigue, unspecified type  -Improving, cardiology note reviewed.  Possible lead placement for R wave concerns last visit.  RTC precautions if not continuing to improve or new/worsening symptoms.  No orders of the defined types were placed in this encounter.  Patient Instructions  With diabetes test improved no med changes for now.  Recheck in 3 months but if blood sugars are increasing I am happy to see you sooner.  Glad to hear the cardiology visit went well.  If fatigue does not continue to improve, or any worsening swelling please return for recheck sooner than 39-monthplanned appointment.    Signed,   JMerri Ray MD [facility] 04/21/22 10:44 AM

## 2022-04-21 NOTE — Patient Instructions (Signed)
With diabetes test improved no med changes for now.  Recheck in 3 months but if blood sugars are increasing I am happy to see you sooner.  Glad to hear the cardiology visit went well.  If fatigue does not continue to improve, or any worsening swelling please return for recheck sooner than 56-monthplanned appointment.

## 2022-04-27 NOTE — Congregational Nurse Program (Signed)
  Dept: [phone]   Congregational Nurse Program Note  Date of Encounter: 04/20/2022  Past Medical History: Past Medical History:  Diagnosis Date   Breast cancer ([place])    Cancer ([place])    right breast cancer   Diabetes mellitus without complication ([place])    Family history of breast cancer    Hypertension    Personal history of radiation therapy    Sleep apnea    does not use CPAP    Encounter Details:  CNP Questionnaire - 04/20/22 1309       Questionnaire   Do you give verbal consent to treat you today? Yes    Location Patient Served  Not Applicable   [place] or Organization    Patient Status Unknown    Insurance Unknown    Insurance Referral N/A    Medication N/A    Medical Provider Yes    Screening Referrals N/A    Medical Referral N/A    Medical Appointment Made N/A    Food N/A    Transportation N/A    Housing/Utilities N/A    Interpersonal Safety N/A    Intervention Spiritual Care;Support;Educate;Counsel    ED Visit Averted N/A    Life-Saving Intervention Made N/A           There were no vitals filed for this visit.   Last Weight  Most recent update: 04/27/2022  3:40 PM    Weight  88.5 kg (195 lb)            Patient states she is feeling better today and has some relief as she recently saw the cardiologist. Patient has recently loss 5 lbs. Patient is participating in a health challenge with her church and weighing weekly with the congregational nurse. Patient has increase her physical mobility and states she is feeling much better. Patient is also taking healthy cooking classes with the congregational nurse. We will continue on weight loss management.   Andrez Grime, BSN, RN, CRRN,CMSRN

## 2022-05-05 ENCOUNTER — Other Ambulatory Visit: Payer: Self-pay | Admitting: Family Medicine

## 2022-05-05 DIAGNOSIS — E1129 Type 2 diabetes mellitus with other diabetic kidney complication: Secondary | ICD-10-CM

## 2022-05-05 DIAGNOSIS — I10 Essential (primary) hypertension: Secondary | ICD-10-CM

## 2022-05-05 NOTE — Telephone Encounter (Signed)
Patient is requesting a refill of the following medications: Requested Prescriptions   Pending Prescriptions Disp Refills   metFORMIN (GLUCOPHAGE) 1000 MG tablet [Pharmacy Med Name: METFORMIN HCL 1,000 MG TABLET] 180 tablet 1    Sig: Take 1 tablet (1,000 mg total) by mouth 2 (two) times daily with a meal.   losartan (COZAAR) 100 MG tablet [Pharmacy Med Name: LOSARTAN POTASSIUM 100 MG TAB] 90 tablet 1    Sig: TAKE 1 TABLET BY MOUTH EVERY DAY   cloNIDine (CATAPRES) 0.2 MG tablet [Pharmacy Med Name: CLONIDINE HCL 0.2 MG TABLET] 180 tablet 1    Sig: TAKE 1 TABLET BY MOUTH 2 TIMES DAILY.    Date of patient request: 05/05/22 Last office visit: 04/21/22 Date of last refill: 09/09/21 Last refill amount: 180

## 2022-05-09 NOTE — Congregational Nurse Program (Signed)
  Dept: [phone]   Congregational Nurse Program Note  Date of Encounter: 03/23/2022  Past Medical History: Past Medical History:  Diagnosis Date   Breast cancer ([place])    Cancer ([place])    right breast cancer   Diabetes mellitus without complication ([place])    Family history of breast cancer    Hypertension    Personal history of radiation therapy    Sleep apnea    does not use CPAP    Encounter Details:   Today's Vitals   03/23/22 1330  BP: 134/72  Pulse: 82  Resp: 18  Temp: (!) 97.2 F (36.2 C)  TempSrc: Temporal  SpO2: 98%  Weight: 194 lb (88 kg)   Body mass index is 33.3 kg/m.   CCNP Questionnaire performed and in electronic documentation. Patient concerning about decreased energy. Patient denies chest discomfort, denies palpitation, and denies any pain. Patient states she has an upcoming appointment on Apr 07, 2022. Spiritual care given. Will follow up with patient concerning follow up appoint with PCP. Patient given education and care plan for conservation of energy. Recommended extended rest period with exertion.   Andrez Grime, BSN, RN, CRRN,CMSRN

## 2022-05-09 NOTE — Congregational Nurse Program (Signed)
  Dept: [phone]   Congregational Nurse Program Note  Date of Encounter: 04/27/2022  Past Medical History: Past Medical History:  Diagnosis Date   Breast cancer ([place])    Cancer ([place])    right breast cancer   Diabetes mellitus without complication ([place])    Family history of breast cancer    Hypertension    Personal history of radiation therapy    Sleep apnea    does not use CPAP    Encounter Details:  CNP Questionnaire - 04/27/22 1330       Questionnaire   Do you give verbal consent to treat you today? Yes    Location Patient Served  Not Applicable   [place] or Organization    Patient Status Unknown    Insurance Unknown    Insurance Referral Medicare    Medication N/A    Medical Provider Yes    Screening Referrals N/A    Medical Referral N/A    Medical Appointment Made N/A    Food N/A    Transportation N/A    Housing/Utilities N/A    Interpersonal Safety N/A    Intervention Spiritual Care;Support;Educate;Counsel    ED Visit Averted N/A    Life-Saving Intervention Made N/A            Today's Vitals   04/27/22 1330  Weight: 196 lb (88.9 kg)   Body mass index is 33.64 kg/m.   Patient is on weight loss journey. Given patient education on healthy eating, weight loss, reading nutritional labels. Patient states she is excited about her 5 pound loss. Gave patient spiritual support. Will continue to follow up with patient about progress.   Andrez Grime, BSN, RN, CRRN,CMSRN

## 2022-05-10 ENCOUNTER — Telehealth: Payer: Self-pay | Admitting: Family Medicine

## 2022-05-10 NOTE — Telephone Encounter (Signed)
Left message for patient to call back and schedule Medicare Annual Wellness Visit (AWV).   Please offer to do virtually or by telephone.  Left office number and my jabber [phone].  Last AWV:08/03/2017  Please schedule at anytime with Nurse Health Advisor.

## 2022-05-14 ENCOUNTER — Other Ambulatory Visit: Payer: Self-pay | Admitting: Family Medicine

## 2022-05-14 DIAGNOSIS — I10 Essential (primary) hypertension: Secondary | ICD-10-CM

## 2022-05-15 DIAGNOSIS — Z139 Encounter for screening, unspecified: Secondary | ICD-10-CM

## 2022-05-15 LAB — GLUCOSE, POCT (MANUAL RESULT ENTRY): POC Glucose: 159 mg/dl — AB (ref 70–99)

## 2022-05-15 NOTE — Congregational Nurse Program (Signed)
  Dept: [phone]   Congregational Nurse Program Note  Date of Encounter: 05/15/2022  Past Medical History: Past Medical History:  Diagnosis Date   Breast cancer ([place])    Cancer ([place])    right breast cancer   Diabetes mellitus without complication ([place])    Family history of breast cancer    Hypertension    Personal history of radiation therapy    Sleep apnea    does not use CPAP    Encounter Details:  CNP Questionnaire - 05/15/22 1241       Questionnaire   Do you give verbal consent to treat you today? Yes    Location Patient Served  Not Applicable   [place] or Organization    Patient Status Unknown    Insurance Unknown    Insurance Referral Medicare    Medication N/A    Medical Provider Yes    Screening Referrals N/A    Medical Referral N/A    Medical Appointment Made N/A    Food N/A    Transportation N/A    Housing/Utilities N/A    Interpersonal Safety N/A    Intervention Spiritual Care;Support;Educate;Counsel;Blood glucose;Blood pressure    ED Visit Averted N/A    Life-Saving Intervention Made N/A            Today's Vitals   05/15/22 1237  BP: (!) 170/78  Pulse: 78  Resp: 16  Temp: 97.6 F (36.4 C)  TempSrc: Temporal  SpO2: 99%  Weight: 201 lb (91.2 kg)   Body mass index is 34.5 kg/m.   Patient wanted glucose and vitals obtained. Patient concerned as about blood glucose as this morning it was elevated when she checked her glucose at home. After answering some questions from the congregational nurse, expressed she had eaten a large amount of watermelon the prior day. Nurse educated patient about consumption of fruit with blood glucose. Education was given about healthy eating. Patient verbalized understanding of all education. Patient also educated on hypertension. Patient has gaines some weight in the past two weeks and states she will exercise more this week and monitor her glucose. Patient plans on  following up with nurse on this upcoming Wednesday.   Andrez Grime, BSN, RN, CRRN,CMSRN

## 2022-05-19 ENCOUNTER — Ambulatory Visit (INDEPENDENT_AMBULATORY_CARE_PROVIDER_SITE_OTHER): Payer: Medicare Other

## 2022-05-19 DIAGNOSIS — Z0000 Encounter for general adult medical examination without abnormal findings: Secondary | ICD-10-CM

## 2022-05-19 DIAGNOSIS — Z91148 Patient's other noncompliance with medication regimen for other reason: Secondary | ICD-10-CM | POA: Diagnosis not present

## 2022-05-19 NOTE — Patient Instructions (Signed)
[name] , Thank you for taking time to come for your Medicare Wellness Visit. I appreciate your ongoing commitment to your health goals. Please review the following plan we discussed and let me know if I can assist you in the future.   Screening recommendations/referrals: Colonoscopy: no longer required  Mammogram: no longer required  Bone Density: 06/04/2018 Recommended yearly ophthalmology/optometry visit for glaucoma screening and checkup Recommended yearly dental visit for hygiene and checkup  Vaccinations: Influenza vaccine: completed  Pneumococcal vaccine: completed  Tdap vaccine: due  Shingles vaccine: will consider     Advanced directives: no   Conditions/risks identified: Patient unable to afford Januvia medication , over $400 and she is maxim out her credit cards .ref [phone] submitted    Next appointment: none    Preventive Care 76 Years and Older, Female Preventive care refers to lifestyle choices and visits with your health care provider that can promote health and wellness. What does preventive care include? A yearly physical exam. This is also called an annual well check. Dental exams once or twice a year. Routine eye exams. Ask your health care provider how often you should have your eyes checked. Personal lifestyle choices, including: Daily care of your teeth and gums. Regular physical activity. Eating a healthy diet. Avoiding tobacco and drug use. Limiting alcohol use. Practicing safe sex. Taking low-dose aspirin every day. Taking vitamin and mineral supplements as recommended by your health care provider. What happens during an annual well check? The services and screenings done by your health care provider during your annual well check will depend on your age, overall health, lifestyle risk factors, and family history of disease. Counseling  Your health care provider may ask you questions about your: Alcohol use. Tobacco use. Drug use. Emotional  well-being. Home and relationship well-being. Sexual activity. Eating habits. History of falls. Memory and ability to understand (cognition). Work and work Statistician. Reproductive health. Screening  You may have the following tests or measurements: Height, weight, and BMI. Blood pressure. Lipid and cholesterol levels. These may be checked every 5 years, or more frequently if you are over 91 years old. Skin check. Lung cancer screening. You may have this screening every year starting at age 4 if you have a 30-pack-year history of smoking and currently smoke or have quit within the past 15 years. Fecal occult blood test (FOBT) of the stool. You may have this test every year starting at age 21. Flexible sigmoidoscopy or colonoscopy. You may have a sigmoidoscopy every 5 years or a colonoscopy every 10 years starting at age 25. Hepatitis C blood test. Hepatitis B blood test. Sexually transmitted disease (STD) testing. Diabetes screening. This is done by checking your blood sugar (glucose) after you have not eaten for a while (fasting). You may have this done every 1-3 years. Bone density scan. This is done to screen for osteoporosis. You may have this done starting at age 32. Mammogram. This may be done every 1-2 years. Talk to your health care provider about how often you should have regular mammograms. Talk with your health care provider about your test results, treatment options, and if necessary, the need for more tests. Vaccines  Your health care provider may recommend certain vaccines, such as: Influenza vaccine. This is recommended every year. Tetanus, diphtheria, and acellular pertussis (Tdap, Td) vaccine. You may need a Td booster every 10 years. Zoster vaccine. You may need this after age 60. Pneumococcal 13-valent conjugate (PCV13) vaccine. One dose is recommended after age 50.  Pneumococcal polysaccharide (PPSV23) vaccine. One dose is recommended after age 4. Talk to your  health care provider about which screenings and vaccines you need and how often you need them. This information is not intended to replace advice given to you by your health care provider. Make sure you discuss any questions you have with your health care provider. Document Released: 12/18/2015 Document Revised: 08/10/2016 Document Reviewed: 09/22/2015 Elsevier Interactive Patient Education  2017 [place] Prevention in the Home Falls can cause injuries. They can happen to people of all ages. There are many things you can do to make your home safe and to help prevent falls. What can I do on the outside of my home? Regularly fix the edges of walkways and driveways and fix any cracks. Remove anything that might make you trip as you walk through a door, such as a raised step or threshold. Trim any bushes or trees on the path to your home. Use bright outdoor lighting. Clear any walking paths of anything that might make someone trip, such as rocks or tools. Regularly check to see if handrails are loose or broken. Make sure that both sides of any steps have handrails. Any raised decks and porches should have guardrails on the edges. Have any leaves, snow, or ice cleared regularly. Use sand or salt on walking paths during winter. Clean up any spills in your garage right away. This includes oil or grease spills. What can I do in the bathroom? Use night lights. Install grab bars by the toilet and in the tub and shower. Do not use towel bars as grab bars. Use non-skid mats or decals in the tub or shower. If you need to sit down in the shower, use a plastic, non-slip stool. Keep the floor dry. Clean up any water that spills on the floor as soon as it happens. Remove soap buildup in the tub or shower regularly. Attach bath mats securely with double-sided non-slip rug tape. Do not have throw rugs and other things on the floor that can make you trip. What can I do in the bedroom? Use night  lights. Make sure that you have a light by your bed that is easy to reach. Do not use any sheets or blankets that are too big for your bed. They should not hang down onto the floor. Have a firm chair that has side arms. You can use this for support while you get dressed. Do not have throw rugs and other things on the floor that can make you trip. What can I do in the kitchen? Clean up any spills right away. Avoid walking on wet floors. Keep items that you use a lot in easy-to-reach places. If you need to reach something above you, use a strong step stool that has a grab bar. Keep electrical cords out of the way. Do not use floor polish or wax that makes floors slippery. If you must use wax, use non-skid floor wax. Do not have throw rugs and other things on the floor that can make you trip. What can I do with my stairs? Do not leave any items on the stairs. Make sure that there are handrails on both sides of the stairs and use them. Fix handrails that are broken or loose. Make sure that handrails are as long as the stairways. Check any carpeting to make sure that it is firmly attached to the stairs. Fix any carpet that is loose or worn. Avoid having throw rugs at the  top or bottom of the stairs. If you do have throw rugs, attach them to the floor with carpet tape. Make sure that you have a light switch at the top of the stairs and the bottom of the stairs. If you do not have them, ask someone to add them for you. What else can I do to help prevent falls? Wear shoes that: Do not have high heels. Have rubber bottoms. Are comfortable and fit you well. Are closed at the toe. Do not wear sandals. If you use a stepladder: Make sure that it is fully opened. Do not climb a closed stepladder. Make sure that both sides of the stepladder are locked into place. Ask someone to hold it for you, if possible. Clearly mark and make sure that you can see: Any grab bars or handrails. First and last  steps. Where the edge of each step is. Use tools that help you move around (mobility aids) if they are needed. These include: Canes. Walkers. Scooters. Crutches. Turn on the lights when you go into a dark area. Replace any light bulbs as soon as they burn out. Set up your furniture so you have a clear path. Avoid moving your furniture around. If any of your floors are uneven, fix them. If there are any pets around you, be aware of where they are. Review your medicines with your doctor. Some medicines can make you feel dizzy. This can increase your chance of falling. Ask your doctor what other things that you can do to help prevent falls. This information is not intended to replace advice given to you by your health care provider. Make sure you discuss any questions you have with your health care provider. Document Released: 09/17/2009 Document Revised: 04/28/2016 Document Reviewed: 12/26/2014 Elsevier Interactive Patient Education  2017 [employer].

## 2022-05-19 NOTE — Progress Notes (Signed)
Subjective:   Susan Miles is a 76 y.o. female who presents for Medicare Annual (Subsequent) preventive examination.   I connected with Amour Trigg  today by telephone and verified that I am speaking with the correct person using two identifiers. Location patient: home Location provider: work Persons participating in the virtual visit: patient, provider.   I discussed the limitations, risks, security and privacy concerns of performing an evaluation and management service by telephone and the availability of in person appointments. I also discussed with the patient that there may be a patient responsible charge related to this service. The patient expressed understanding and verbally consented to this telephonic visit.    Interactive audio and video telecommunications were attempted between this provider and patient, however failed, due to patient having technical difficulties OR patient did not have access to video capability.  We continued and completed visit with audio only.    Review of Systems    Cardiac Risk Factors include: advanced age (>66mn, >>78women);diabetes mellitus;dyslipidemia     Objective:    Today's Vitals   There is no height or weight on file to calculate BMI.     05/19/2022    9:13 AM 08/27/2019   10:05 AM 08/09/2019   10:49 AM 08/02/2019    3:55 PM 11/20/2017    4:04 PM 08/03/2017    8:45 AM 06/15/2015    4:00 PM  Advanced Directives  Does Patient Have a Medical Advance Directive? Yes No No No No Yes No  Type of AVisual merchandiserof [place]ala Cyn[place]Living will   Does patient want to make changes to medical advance directive? No - Patient declined        Copy of [place] Chart?      No - copy requested   Would patient like information on creating a medical advance directive?   No - Patient declined No - Patient declined No - Patient declined  No - patient declined information    Current Medications  (verified) Outpatient Encounter Medications as of 05/19/2022  Medication Sig   ACCU-CHEK AVIVA PLUS test strip USE TO TEST BLOOD SUGAR ONCE DAILY. DX E11.9   acetaminophen (TYLENOL) 500 MG tablet Take 500 mg by mouth every 6 (six) hours as needed for mild pain.   amLODipine (NORVASC) 10 MG tablet Take 1 tablet (10 mg total) by mouth daily.   aspirin 81 MG tablet Take 81 mg by mouth daily.   atorvastatin (LIPITOR) 20 MG tablet TAKE 1 TABLET BY MOUTH DAILY AT 6 PM.   Blood Glucose Monitoring Suppl (LDR BLOOD GLUCOSE TRUETEST) w/Device KIT 1 Device by Does not apply route daily. Test blood sugar once daily. Dx: E11.9   cetirizine (ZYRTEC) 10 MG tablet TAKE 1 TABLET BY MOUTH EVERY DAY   cloNIDine (CATAPRES) 0.2 MG tablet TAKE 1 TABLET BY MOUTH 2 TIMES DAILY.   fluticasone (FLONASE) 50 MCG/ACT nasal spray Place 1-2 sprays into both nostrils daily.   glipiZIDE (GLUCOTROL) 10 MG tablet Take 2 tablets (20 mg total) by mouth 2 (two) times daily before a meal.   Lancets MISC Test blood sugar once daily. Dx: E11.9   losartan (COZAAR) 100 MG tablet TAKE 1 TABLET BY MOUTH EVERY DAY   metFORMIN (GLUCOPHAGE) 1000 MG tablet TAKE 1 TABLET (1,000 MG TOTAL) BY MOUTH 2 (TWO) TIMES DAILY WITH A MEAL.   metoprolol (TOPROL-XL) 200 MG 24 hr tablet TAKE 1 TABLET BY MOUTH EVERY DAY  promethazine-dextromethorphan (PROMETHAZINE-DM) 6.25-15 MG/5ML syrup Take 2.5-5 mLs by mouth at bedtime as needed for cough.   sitaGLIPtin (JANUVIA) 50 MG tablet Take 1 tablet (50 mg total) by mouth daily.   tamoxifen (NOLVADEX) 20 MG tablet Take 1 tablet (20 mg total) by mouth daily.   triamcinolone cream (KENALOG) 0.1 % Apply 1 application topically 2 (two) times daily.   doxycycline (VIBRA-TABS) 100 MG tablet Take 1 tablet (100 mg total) by mouth 2 (two) times daily.   fluconazole (DIFLUCAN) 150 MG tablet Take 1 tablet (150 mg total) by mouth daily. (Patient not taking: Reported on 05/19/2022)   No facility-administered encounter  medications on file as of 05/19/2022.    Allergies (verified) Ace inhibitors   History: Past Medical History:  Diagnosis Date   Breast cancer ([place])    Cancer ([place])    right breast cancer   Diabetes mellitus without complication ([place])    Family history of breast cancer    Hypertension    Personal history of radiation therapy    Sleep apnea    does not use CPAP   Past Surgical History:  Procedure Laterality Date   ABDOMINAL HYSTERECTOMY     BREAST LUMPECTOMY     BREAST LUMPECTOMY WITH RADIOACTIVE SEED LOCALIZATION Right 08/09/2019   Procedure: RIGHT BREAST LUMPECTOMY X 2 WITH RADIOACTIVE SEED LOCALIZATION (3 SEEDS);  Surgeon: Alphonsa Overall, MD;  Location: [facility];  Service: General;  Laterality: Right;   Family History  Problem Relation Age of Onset   Hypertension Mother    Heart disease Father    Breast cancer Sister        diagnosed late 76s   Breast cancer Sister        diagnosed 90s   Social History   Socioeconomic History   Marital status: Married    Spouse name: Not on file   Number of children: Not on file   Years of education: Not on file   Highest education level: Not on file  Occupational History   Not on file  Tobacco Use   Smoking status: Never   Smokeless tobacco: Never  Substance and Sexual Activity   Alcohol use: No   Drug use: No   Sexual activity: Not on file  Other Topics Concern   Not on file  Social History Narrative   Not on file   Social Determinants of Health   Financial Resource Strain: Medium Risk (05/19/2022)   Overall Financial Resource Strain (CARDIA)    Difficulty of Paying Living Expenses: Somewhat hard  Food Insecurity: No Food Insecurity (05/19/2022)   Hunger Vital Sign    Worried About Running Out of Food in the Last Year: Never true    Ran Out of Food in the Last Year: Never true  Transportation Needs: No Transportation Needs (05/19/2022)   PRAPARE - Hydrologist (Medical): No     Lack of Transportation (Non-Medical): No  Physical Activity: Insufficiently Active (05/19/2022)   Exercise Vital Sign    Days of Exercise per Week: 3 days    Minutes of Exercise per Session: 30 min  Stress: No Stress Concern Present (05/19/2022)   [place]    Feeling of Stress : Only a little  Social Connections: Moderately Integrated (05/19/2022)   Social Connection and Isolation Panel [NHANES]    Frequency of Communication with Friends and Family: Three times a week    Frequency of Social Gatherings with  Friends and Family: Three times a week    Attends Religious Services: More than 4 times per year    Active Member of Clubs or Organizations: No    Attends Archivist Meetings: Never    Marital Status: Married    Tobacco Counseling Counseling given: Not Answered   Clinical Intake:  Pre-visit preparation completed: Yes  Pain : No/denies pain     Nutritional Risks: None  How often do you need to have someone help you when you read instructions, pamphlets, or other written materials from your doctor or pharmacy?: 1 - Never What is the last grade level you completed in school?: 10 th grade  Diabetic?yes Nutrition Risk Assessment:  Has the patient had any N/V/D within the last 2 months?  No  Does the patient have any non-healing wounds?  No  Has the patient had any unintentional weight loss or weight gain?  No   Diabetes:  Is the patient diabetic?  Yes  If diabetic, was a CBG obtained today?  No  Did the patient bring in their glucometer from home?  No  How often do you monitor your CBG's? Daily .   Financial Strains and Diabetes Management:  Are you having any financial strains with the device, your supplies or your medication? Yes  Thurnell Garbe  cost $440 per month .  Does the patient want to be seen by Chronic Care Management for management of their diabetes?  No  Would the patient  like to be referred to a Nutritionist or for Diabetic Management?  No   Diabetic Exams:  Diabetic Eye Exam: Completed 03/2022/ Diabetic Foot Exam: Overdue, Pt has been advised about the importance in completing this exam. Pt is scheduled for diabetic foot exam on next office visit .   Interpreter Needed?: No  Information entered by :: [email or web address]   Activities of Daily Living    05/19/2022    9:16 AM  In your present state of health, do you have any difficulty performing the following activities:  Hearing? 0  Vision? 0  Difficulty concentrating or making decisions? 0  Walking or climbing stairs? 0  Dressing or bathing? 0  Doing errands, shopping? 0  Preparing Food and eating ? N  Using the Toilet? N  In the past six months, have you accidently leaked urine? N  Do you have problems with loss of bowel control? N  Managing your Medications? N  Managing your Finances? N  Housekeeping or managing your Housekeeping? N    Patient Care Team: [name], MD as PCP - General (Family Medicine) Webb Laws, [place] as Referring Physician (Optometry) Nicholas Lose, MD as Consulting Physician (Hematology and Oncology) Kyung Rudd, MD as Consulting Physician (Radiation Oncology) Alphonsa Overall, MD as Consulting Physician (General Surgery)  Indicate any recent Medical Services you may have received from other than [facility] providers in the past year (date may be approximate).     Assessment:   This is a routine wellness examination for Felipa.  Hearing/Vision screen Vision Screening - Comments:: Annual eye exams wears glasses   Dietary issues and exercise activities discussed: Current Exercise Habits: Home exercise routine, Type of exercise: walking, Time (Minutes): 30, Frequency (Times/Week): 3, Weekly Exercise (Minutes/Week): 90, Intensity: Mild, Exercise limited by: None identified   Goals Addressed             This Visit's Progress    Increase water intake   On track     Patient will try to increase  her water intake to 4-5 bottles daily.        Depression Screen    05/19/2022    9:13 AM 05/19/2022    9:06 AM 04/07/2022   10:03 AM 12/13/2021    1:36 PM 12/03/2021   10:55 AM 12/08/2020    9:25 AM 08/26/2020   10:51 AM  PHQ 2/9 Scores  PHQ - 2 Score 0 0 0 0 0 0 0    Fall Risk    05/19/2022    9:13 AM 04/07/2022   10:03 AM 12/13/2021    1:36 PM 12/03/2021   10:55 AM 12/08/2020    9:25 AM  Fall Risk   Falls in the past year? 0 0 0 0 0  Number falls in past yr: 0 0 0 0 0  Injury with Fall? 0 0 0 0 0  Risk for fall due to : No Fall Risks No Fall Risks No Fall Risks No Fall Risks No Fall Risks  Follow up Falls evaluation completed;Education provided Falls evaluation completed Falls evaluation completed Falls evaluation completed Falls evaluation completed    FALL RISK PREVENTION PERTAINING TO THE HOME:  Any stairs in or around the home? No  If so, are there any without handrails? No  Home free of loose throw rugs in walkways, pet beds, electrical cords, etc? Yes  Adequate lighting in your home to reduce risk of falls? Yes   ASSISTIVE DEVICES UTILIZED TO PREVENT FALLS:  Life alert? No  Use of a cane, walker or w/c? No  Grab bars in the bathroom? Yes  Shower chair or bench in shower? Yes  Elevated toilet seat or a handicapped toilet? Yes     Cognitive Function:  Normal cognitive status assessed by telephone conversation  by this Nurse Health Advisor. No abnormalities found.        08/03/2017    8:51 AM  6CIT Screen  What Year? 0 points  What month? 0 points  What time? 0 points  Count back from 20 0 points  Months in reverse 0 points  Repeat phrase 2 points  Total Score 2 points    Immunizations Immunization History  Administered Date(s) Administered   DTaP 02/03/2007   Influenza Split 09/17/2012   Influenza, High Dose Seasonal PF 09/22/2018, 09/06/2019   Influenza,inj,Quad PF,6+ Mos 09/30/2013, 07/28/2014, 09/21/2015, 09/22/2016,  08/03/2017   Influenza-Unspecified 08/19/2020   PFIZER(Purple Top)SARS-COV-2 Vaccination 01/10/2020, 01/31/2020, 10/03/2020   Pneumococcal Conjugate-13 12/05/2010, 10/27/2014   Pneumococcal Polysaccharide-23 09/21/2015   Zoster, Live 03/19/2014    TDAP status: Due, Education has been provided regarding the importance of this vaccine. Advised may receive this vaccine at local pharmacy or Health Dept. Aware to provide a copy of the vaccination record if obtained from local pharmacy or Health Dept. Verbalized acceptance and understanding.  Flu Vaccine status: Up to date  Pneumococcal vaccine status: Up to date  Covid-19 vaccine status: Completed vaccines  Qualifies for Shingles Vaccine? Yes   Zostavax completed No   Shingrix Completed?: No.    Education has been provided regarding the importance of this vaccine. Patient has been advised to call insurance company to determine out of pocket expense if they have not yet received this vaccine. Advised may also receive vaccine at local pharmacy or Health Dept. Verbalized acceptance and understanding.  Screening Tests Health Maintenance  Topic Date Due   COVID-19 Vaccine (4 - Booster for Pfizer series) 11/28/2020   OPHTHALMOLOGY EXAM  01/02/2021   Zoster Vaccines- Shingrix (1 of 2) 07/08/2022 (Originally  12/02/1965)   TETANUS/TDAP  04/08/2023 (Originally 02/02/2017)   COLONOSCOPY (Pts 45-58yr Insurance coverage will need to be confirmed)  04/22/2023 (Originally 02/02/2021)   INFLUENZA VACCINE  07/05/2022   HEMOGLOBIN A1C  10/08/2022   FOOT EXAM  04/08/2023   Pneumonia Vaccine 76 Years old  Completed   DEXA SCAN  Completed   Hepatitis C Screening  Completed   HPV VACCINES  Aged Out    Health Maintenance  Health Maintenance Due  Topic Date Due   COVID-19 Vaccine (4 - Booster for Pfizer series) 11/28/2020   OPHTHALMOLOGY EXAM  01/02/2021    Colorectal cancer screening: No longer required.   Mammogram status: No longer required due to  age.  Bone Density status: Completed 06/05/2015. Results reflect: Bone density results: OSTEOPENIA. Repeat every 5 years.  Lung Cancer Screening: (Low Dose CT Chest recommended if Age 318-80years, 30 pack-year currently smoking OR have quit w/in 15years.) does not qualify.   Lung Cancer Screening Referral: n/a  Additional Screening:  Hepatitis C Screening: does not qualify; Completed 01/12/2017  Vision Screening: Recommended annual ophthalmology exams for early detection of glaucoma and other disorders of the eye. Is the patient up to date with their annual eye exam?  Yes  Who is the provider or what is the name of the office in which the patient attends annual eye exams? Dr.McFarland  If pt is not established with a provider, would they like to be referred to a provider to establish care? No .   Dental Screening: Recommended annual dental exams for proper oral hygiene  Community Resource Referral / Chronic Care Management: CRR required this visit?  No   CCM required this visit?  yes     Plan:     I have personally reviewed and noted the following in the patient's chart:   Medical and social history Use of alcohol, tobacco or illicit drugs  Current medications and supplements including opioid prescriptions.  Functional ability and status Nutritional status Physical activity Advanced directives List of other physicians Hospitalizations, surgeries, and ER visits in previous 12 months Vitals Screenings to include cognitive, depression, and falls Referrals and appointments  In addition, I have reviewed and discussed with patient certain preventive protocols, quality metrics, and best practice recommendations. A written personalized care plan for preventive services as well as general preventive health recommendations were provided to patient.     LRandel Pigg LPN   [birth year]2010  Nurse Notes: Patient unable to afford Januvia medication , over $400 and she is maxim out her  credit cards .ref 2300 submitted

## 2022-05-23 ENCOUNTER — Telehealth: Payer: Self-pay | Admitting: Family Medicine

## 2022-05-23 NOTE — Progress Notes (Signed)
  Care Management  Note   05/23/2022 Name: Susan Miles MRN: [id number] DOB: 24-Jan-1946  Susan Miles is a 76 y.o. year old female who is a primary care patient of [name], MD. The care management team was consulted for assistance with chronic disease management and care coordination needs.   [name] was given information about Care Management services today including:  CCM service includes personalized support from designated clinical staff supervised by the physician, including individualized plan of care and coordination with other care providers 24/7 contact phone numbers for assistance for urgent and routine care needs. Service will only be billed when office clinical staff spend 20 minutes or more in a month to coordinate care. Only one practitioner may furnish and bill the service in a calendar month. The patient may stop CCM services at amy time (effective at the end of the month) by phone call to the office staff. The patient will be responsible for cost sharing (co-pay) or up to 20% of the service fee (after annual deductible is met)  Patient agreed to services and verbal consent obtained.  Follow up plan:   Face to Face appointment with care management team member scheduled for: 06/01/22 $RemoveBefo'@11am'yhxolcXczCi$   [place]

## 2022-05-24 ENCOUNTER — Other Ambulatory Visit: Payer: Self-pay | Admitting: Family Medicine

## 2022-05-24 DIAGNOSIS — I10 Essential (primary) hypertension: Secondary | ICD-10-CM

## 2022-05-24 DIAGNOSIS — J309 Allergic rhinitis, unspecified: Secondary | ICD-10-CM

## 2022-05-24 DIAGNOSIS — E1129 Type 2 diabetes mellitus with other diabetic kidney complication: Secondary | ICD-10-CM

## 2022-05-24 NOTE — Progress Notes (Signed)
Chronic Care Management Pharmacy Note  06/01/2022 Name:  Susan Miles MRN:  [id number] DOB:  06/09/1946  Summary: Initial visit with PharmD.  Patient used to be on Farxiga but stopped due to cost concern.  I got her approved for free [place] today.  She tolerated ok in the past but did mention some urinary concerns.  Thoughts are now with improved A1c she may not be as much of a risk for UTI, yeast.  Also given PAP application for Januvia.  Recommendations/Changes made from today's visit: Consider replacing Januvia with [place] for cost effectiveness.  Future plans if approved for Januvia could be to replace glipizide with Januvia to reduce hypoglycemia risk.  Plan: FU on PAP  Send in rx for Farxiga to [facility] 6 months PharmD CMA to call in 3 months to assess glucose control   Subjective: Susan Miles is an 76 y.o. year old female who is a primary patient of Carlota Raspberry, Ranell Patrick, MD.  The CCM team was consulted for assistance with disease management and care coordination needs.    Engaged with patient face to face for initial visit in response to provider referral for pharmacy case management and/or care coordination services.   Consent to Services:  The patient was given the following information about Chronic Care Management services today, agreed to services, and gave verbal consent: 1. CCM service includes personalized support from designated clinical staff supervised by the primary care provider, including individualized plan of care and coordination with other care providers 2. 24/7 contact phone numbers for assistance for urgent and routine care needs. 3. Service will only be billed when office clinical staff spend 20 minutes or more in a month to coordinate care. 4. Only one practitioner may furnish and bill the service in a calendar month. 5.The patient may stop CCM services at any time (effective at the end of the month) by phone call to the office staff. 6. The  patient will be responsible for cost sharing (co-pay) of up to 20% of the service fee (after annual deductible is met). Patient agreed to services and consent obtained.  Patient Care Team: [name], MD as PCP - General (Family Medicine) Webb Laws, [place] as Referring Physician (Optometry) Nicholas Lose, MD as Consulting Physician (Hematology and Oncology) Kyung Rudd, MD as Consulting Physician (Radiation Oncology) Alphonsa Overall, MD as Consulting Physician (General Surgery) Edythe Clarity, [facility] as Pharmacist (Pharmacist)  Recent office visits:  04/21/22 [name] MD - Family Medicine - Diabetes - Labs were ordered. No medication changes were noted. Follow up in 3 months.    04/07/22 [name]. MD - Family Medicine - Mixed hyperlipidemia - Labs were ordered. EKG performed. Referral to cardiology. Follow up in 2 weeks.    12/13/21 [name], MD - Family Medicine - Cough - AIC ordered. Atorvastatin (LIPITOR) 20 MG tablet and Dapagliflozin propanediol (FARXIGA) 10 MG TABS tablet prescribed. Follow up in 3 months.    12/03/2021 [name], MD - Family Medicine - Cough - CXR ordered. Amoxicillin-clavulanate (AUGMENTIN) 875-125 MG tablet, Benzonatate (TESSALON) 100 MG capsule, and promethazine-dextromethorphan (PROMETHAZINE-DM) 6.25-15 MG/5ML syrup prescribed. Follow up if no improvement.    Recent consult visits:  04/12/22 Phineas Inches MD - Cardiology - Abnormal EKG - EKG performed - Discussed wearing compression socks for edema and wearing CPAP at night to help with fatigue. Follow up as needed.    Hospital visits:  None in previous 6 months   Objective:  Lab Results  Component  Value Date   CREATININE 0.95 04/07/2022   BUN 18 04/07/2022   GFR 58.68 (L) 04/07/2022   GFRNONAA 53 (L) 12/15/2020   GFRAA 61 12/15/2020   NA 139 04/07/2022   K 4.2 04/07/2022   CALCIUM 10.5 04/07/2022   CO2 27 04/07/2022   GLUCOSE 117 (H) 04/07/2022    Lab Results  Component  Value Date/Time   HGBA1C 7.2 (H) 04/07/2022 11:56 AM   HGBA1C 8.0 (H) 12/13/2021 02:26 PM   GFR 58.68 (L) 04/07/2022 11:56 AM   GFR 56.07 (L) 09/09/2021 02:19 PM   MICROALBUR 7.0 (H) 04/07/2022 11:56 AM   MICROALBUR 7.5 03/23/2016 10:16 AM    Last diabetic Eye exam:  Lab Results  Component Value Date/Time   HMDIABEYEEXA No Retinopathy 01/03/2020 12:00 AM    Last diabetic Foot exam: No results found for: "HMDIABFOOTEX"   Lab Results  Component Value Date   CHOL 132 04/07/2022   HDL 35.70 (L) 04/07/2022   LDLCALC 76 04/07/2022   TRIG 102.0 04/07/2022   CHOLHDL 4 04/07/2022       Latest Ref Rng & Units 04/07/2022   11:56 AM 09/09/2021    2:19 PM 12/15/2020   11:11 AM  Hepatic Function  Total Protein 6.0 - 8.3 g/dL 7.1  7.2  6.4   Albumin 3.5 - 5.2 g/dL 3.9  3.9  3.9   AST 0 - 37 U/L _0 ALT 0 - 35 U/L _1 Alk Phosphatase 39 - 117 U/L 52  49  56   Total Bilirubin 0.2 - 1.2 mg/dL 0.4  0.4  0.4     Lab Results  Component Value Date/Time   TSH 2.15 04/07/2022 11:56 AM   TSH 3.310 08/26/2020 11:43 AM       Latest Ref Rng & Units 04/07/2022   11:56 AM 08/26/2020   11:43 AM 07/10/2019    7:47 AM  CBC  WBC 4.0 - 10.5 K/uL 4.4  5.4  5.4   Hemoglobin 12.0 - 15.0 g/dL 12.9  13.1  13.1   Hematocrit 36.0 - 46.0 % 40.1  39.9  41.5   Platelets 150.0 - 400.0 K/uL 248.0  263  329     No results found for: "VD25OH"  Clinical ASCVD: No  The 10-year ASCVD risk score (Arnett DK, et al., 2019) is: 19.4%   Values used to calculate the score:     Age: 35 years     Sex: Female     Is Non-Hispanic African American: Yes     Diabetic: Yes     Tobacco smoker: No     Systolic Blood Pressure: 734 mmHg     Is BP treated: Yes     HDL Cholesterol: 35.7 mg/dL     Total Cholesterol: 132 mg/dL       05/19/2022    9:13 AM 05/19/2022    9:06 AM 04/07/2022   10:03 AM  Depression screen PHQ 2/9  Decreased Interest 0 0 0  Down, Depressed, Hopeless 0 0 0  PHQ - 2 Score 0 0 0       Social History   Tobacco Use  Smoking Status Never  Smokeless Tobacco Never   BP Readings from Last 3 Encounters:  06/01/22 124/80  05/15/22 (!) 170/78  04/21/22 138/78   Pulse Readings from Last 3 Encounters:  05/15/22 78  04/21/22 79  04/12/22 86   Wt Readings from Last 3 Encounters:  05/15/22  201 lb (91.2 kg)  04/27/22 196 lb (88.9 kg)  04/21/22 198 lb 3.2 oz (89.9 kg)   BMI Readings from Last 3 Encounters:  05/15/22 34.50 kg/m  04/27/22 33.64 kg/m  04/21/22 34.02 kg/m    Assessment/Interventions: Review of patient past medical history, allergies, medications, health status, including review of consultants reports, laboratory and other test data, was performed as part of comprehensive evaluation and provision of chronic care management services.   SDOH:  (Social Determinants of Health) assessments and interventions performed: Yes  Financial Resource Strain: Medium Risk (05/19/2022)   Overall Financial Resource Strain (CARDIA)    Difficulty of Paying Living Expenses: Somewhat hard   Food Insecurity: No Food Insecurity (05/19/2022)   Hunger Vital Sign    Worried About Running Out of Food in the Last Year: Never true    [place] in the Last Year: Never true    SDOH Screenings   Alcohol Screen: Low Risk  (05/19/2022)   Alcohol Screen    Last Alcohol Screening Score (AUDIT): 0  Depression (PHQ2-9): Low Risk  (05/19/2022)   Depression (PHQ2-9)    PHQ-2 Score: 0  Financial Resource Strain: Medium Risk (05/19/2022)   Overall Financial Resource Strain (CARDIA)    Difficulty of Paying Living Expenses: Somewhat hard  Food Insecurity: No Food Insecurity (05/19/2022)   Hunger Vital Sign    Worried About Running Out of Food in the Last Year: Never true    Ran Out of Food in the Last Year: Never true  Housing: Low Risk  (05/19/2022)   Housing    Last Housing Risk Score: 0  Physical Activity: Insufficiently Active (05/19/2022)   Exercise Vital Sign    Days of  Exercise per Week: 3 days    Minutes of Exercise per Session: 30 min  Social Connections: Moderately Integrated (05/19/2022)   Social Connection and Isolation Panel [NHANES]    Frequency of Communication with Friends and Family: Three times a week    Frequency of Social Gatherings with Friends and Family: Three times a week    Attends Religious Services: More than 4 times per year    Active Member of Clubs or Organizations: No    Attends Archivist Meetings: Never    Marital Status: Married  Stress: No Stress Concern Present (05/19/2022)   [place]    Feeling of Stress : Only a little  Tobacco Use: Low Risk  (05/19/2022)   Patient History    Smoking Tobacco Use: Never    Smokeless Tobacco Use: Never    Passive Exposure: Not on file  Transportation Needs: No Transportation Needs (05/19/2022)   PRAPARE - Transportation    Lack of Transportation (Medical): No    Lack of Transportation (Non-Medical): No    CCM Care Plan  Allergies  Allergen Reactions   Ace Inhibitors Cough    Medications Reviewed Today     Reviewed by Edythe Clarity, [facility] (Pharmacist) on 06/01/22 at 1226  Med List Status: <None>   Medication Order Taking? Sig Documenting Provider Last Dose Status Informant  ACCU-CHEK AVIVA PLUS test strip [id number] Yes USE TO TEST BLOOD SUGAR ONCE DAILY. DX E11.9 [name], MD Taking Active   acetaminophen (TYLENOL) 500 MG tablet [id number] Yes Take 500 mg by mouth every 6 (six) hours as needed for mild pain. [provider] Taking Active Self  amLODipine (NORVASC) 10 MG tablet [id number] Yes TAKE 1 TABLET BY MOUTH EVERY DAY  [name], MD Taking Active   aspirin 81 MG tablet [id number] Yes Take 81 mg by mouth daily. [provider] Taking Active Self  atorvastatin (LIPITOR) 20 MG tablet [id number] Yes TAKE 1 TABLET BY MOUTH DAILY AT 6 PM. [name], MD Taking Active    Blood Glucose Monitoring Suppl (LDR BLOOD GLUCOSE TRUETEST) w/Device KIT [id number] Yes 1 Device by Does not apply route daily. Test blood sugar once daily. Dx: E11.9 [name], MD Taking Active   cetirizine (ZYRTEC) 10 MG tablet [id number] Yes TAKE 1 TABLET BY MOUTH EVERY DAY [name], MD Taking Active   cloNIDine (CATAPRES) 0.2 MG tablet [id number] Yes TAKE 1 TABLET BY MOUTH 2 TIMES DAILY. [name], MD Taking Active   doxycycline (VIBRA-TABS) 100 MG tablet [id number]  Take 1 tablet (100 mg total) by mouth 2 (two) times daily. [name], MD  Active   fluconazole (DIFLUCAN) 150 MG tablet [id number] Yes Take 1 tablet (150 mg total) by mouth daily. [name], MD Taking Active   fluticasone [facility]) 50 MCG/ACT nasal spray [id number] Yes Place 1-2 sprays into both nostrils daily. [name], MD Taking Active   glipiZIDE (GLUCOTROL) 10 MG tablet [id number] Yes Take 2 tablets (20 mg total) by mouth 2 (two) times daily before a meal. [name], MD Taking Active   JANUVIA 50 MG tablet [id number] Yes TAKE 1 TABLET BY MOUTH EVERY DAY [name], MD Taking Active   Lancets [place] [id number] Yes Test blood sugar once daily. Dx: E11.9 [name], MD Taking Active   losartan (COZAAR) 100 MG tablet [id number] Yes TAKE 1 TABLET BY MOUTH EVERY DAY [name], MD Taking Active   metFORMIN (GLUCOPHAGE) 1000 MG tablet [id number] Yes TAKE 1 TABLET (1,000 MG TOTAL) BY MOUTH 2 (TWO) TIMES DAILY WITH A MEAL. [name], MD Taking Active   metoprolol (TOPROL-XL) 200 MG 24 hr tablet [id number] Yes TAKE 1 TABLET BY MOUTH EVERY DAY [name], MD Taking Active   promethazine-dextromethorphan (PROMETHAZINE-DM) 6.25-15 MG/5ML syrup [id number] Yes Take 2.5-5 mLs by mouth at bedtime as needed for cough. [name], MD Taking Active   tamoxifen (NOLVADEX) 20 MG tablet [id number] Yes Take 1 tablet (20 mg total) by mouth daily. Nicholas Lose, MD Taking  Active   triamcinolone cream (KENALOG) 0.1 % [id number] Yes Apply 1 application topically 2 (two) times daily. [name], MD Taking Active             Patient Active Problem List   Diagnosis Date Noted   Family history of breast cancer    Ductal carcinoma in situ (DCIS) of right breast 07/04/2019   Hyperlipidemia 12/21/2017   Breast asymmetry 05/01/2015   Cataract, nuclear 05/24/2012   HTN (hypertension) 02/10/2012   DM2 (diabetes mellitus, type 2) ([place]) 02/10/2012    Immunization History  Administered Date(s) Administered   DTaP 02/03/2007   Influenza Split 09/17/2012   Influenza, High Dose Seasonal PF 09/22/2018, 09/06/2019   Influenza,inj,Quad PF,6+ Mos 09/30/2013, 07/28/2014, 09/21/2015, 09/22/2016, 08/03/2017   Influenza-Unspecified 08/19/2020   PFIZER(Purple Top)SARS-COV-2 Vaccination 01/10/2020, 01/31/2020, 10/03/2020   Pneumococcal Conjugate-13 12/05/2010, 10/27/2014   Pneumococcal Polysaccharide-23 09/21/2015   Zoster, Live 03/19/2014    Conditions to be addressed/monitored:  HTN, DM, HLD, Hx of Breast Cancer  Care Plan : General Pharmacy (Adult)  Updates made by Edythe Clarity, RPH since 06/01/2022 12:00 AM     Problem: HTN, DM, HLD, Hx of  Breast Cancer   Priority: High  Onset Date: 06/01/2022     Long-Range Goal: Patient-Specific Goal   Start Date: 06/01/2022  Expected End Date: 12/01/2022  This Visit's Progress: On track  Priority: High  Note:   Current Barriers:  Unable to achieve control of A1c   Pharmacist Clinical Goal(s):  Patient will verbalize ability to afford treatment regimen achieve improvement in A1c as evidenced by labs through collaboration with PharmD and provider.   Interventions: 1:1 collaboration with [name], MD regarding development and update of comprehensive plan of care as evidenced by provider attestation and co-signature Inter-disciplinary care team collaboration (see longitudinal plan of  care) Comprehensive medication review performed; medication list updated in electronic medical record  Hypertension (BP goal <130/80) -Controlled, based on recent office visits and home monitoring -Current treatment: Amlodipine 17m Appropriate, Effective, Safe, Accessible Losartan 1053mAppropriate, Effective, Safe, Accessible Metoprolol XL 20051mppropriate, Effective, Safe, Accessible Clonidine 0.2mg45mice daily Appropriate, Effective, Safe, Accessible -Medications previously tried: valsartan, HCTZ  -Current home readings: 124/80 - controlled normally like this at home -Current exercise habits: has elliptical at home and will do once or twice per day -Denies hypotensive/hypertensive symptoms -Educated on BP goals and benefits of medications for prevention of heart attack, stroke and kidney damage; Exercise goal of 150 minutes per week; Importance of home blood pressure monitoring; -Counseled to monitor BP at home a few times per week, document, and provide log at future appointments -Counseled on diet and exercise extensively Recommended to continue current medication Amlodipine could be causing some of her feet swelling, it resolves when she elevates.  She denies any SOB or anything associated.  Continue meds for now, FU if swelling gets worse.  Hyperlipidemia: (LDL goal < 70) -Not ideally controlled -Current treatment: Atorvastatin 20mg25mropriate, Effective, Safe, Accessible -Medications previously tried: pravastatin   -Current exercise habits: see HTN -Educated on Cholesterol goals;  Benefits of statin for ASCVD risk reduction; Importance of limiting foods high in cholesterol; -Recommended to continue current medication Not quite at LDL goal < 70 with DM.  Very borderline last time with Ldl 76.  Will continue current dose. A1c continues to improve so diet is obviously getting better.  Could consider dose increase of statin next visit.  Diabetes (A1c goal  <7%) -Uncontrolled -Current medications: Januvia 50mg 75mopriate, Effective, Safe, Accessible Metformin 1000mg b45mppropriate, Effective, Safe, Accessible Glipizide 20mg tw50mdaily Appropriate, Effective, Query Safe,  -Medications previously tried: Farxiga [place] -Current home glucose readings fasting glucose: 150s today ( she ate late last night).  Normally glucose is < 130 fasting. post prandial glucose: N/A -Denies hypoglycemic/hyperglycemic symptoms -Educated on A1c and blood sugar goals; Complications of diabetes including kidney damage, retinal damage, and cardiovascular disease; Exercise goal of 150 minutes per week; Prevention and management of hypoglycemic episodes; Benefits of routine self-monitoring of blood sugar; -Counseled to check feet daily and get yearly eye exams - she reports she just got her eye exam with Dr. McfarlanEinar Gipcoordinate paperwork coming over to close care gap. -Counseled on diet and exercise extensively Recommended to continue current medication Assessed patient finances. I believe patient is a good candidate for Farxiga Wilder Gladeits benefits in CKD and CV benefit.  She [place] and [place]  She will need prescription sent in pending PCP approval for starting this medication.  Due to improving A1c of 7.2 recently she may not need both that and Januvia.  For now consider initiating  Wilder Glade to go along with metformin and glipizide for cost effective regimen.  In the future, could consider resuming Januvia and getting rid of glipizide to minimize hypo risk.  She was also given PAP form for Januvia and will return that once completed.  Hx of Breast Cancer (Goal: Remain in remission) -Controlled -Current treatment  Tamoxifen 43m Appropriate, Effective, Safe, Accessible -Medications previously tried: none noted -Adherent with medication, she has to take for 5 years.  -Recommended to continue current medication Denies any  adverse effects  Patient Goals/Self-Care Activities Patient will:  - take medications as prescribed as evidenced by patient report and record review check glucose fasting, document, and provide at future appointments collaborate with provider on medication access solutions  Follow Up Plan: The care management team will reach out to the patient again over the next 180 days.        Medication Assistance: None required.  Patient affirms current coverage meets needs.  Compliance/Adherence/Medication fill history: Care Gaps: None - she reports completed eye exam  Star-Rating Drugs: Metformin (GLUCOPHAGE) 1000 MG tablet - last filled 05/05/22 90 days  Losartan (COZAAR) 100 MG tablet - last filled 05/05/22 90 days JANUVIA 50 MG tablet - last filled  Glipizide (GLUCOTROL) 10 MG tablet - last filled 03/01/22 90 days Atorvastatin (LIPITOR) 20 MG tablet - last filled 02/27/22 90 days  Patient's preferred pharmacy is:  [id number] [place], [address]: [phone]: [phone] pill box? No - own methods Pt endorses 100% compliance  We discussed: Benefits of medication synchronization, packaging and delivery as well as enhanced pharmacist oversight with [employer]. Patient decided to: Continue current medication management strategy  Care Plan and Follow Up Patient Decision:  Patient agrees to Care Plan and Follow-up.  Plan: The care management team will reach out to the patient again over the next 180 days.  ChBeverly MilchPharmD Clinical Pharmacist  [phone]

## 2022-05-30 ENCOUNTER — Telehealth: Payer: Self-pay | Admitting: Pharmacist

## 2022-05-30 ENCOUNTER — Other Ambulatory Visit: Payer: Self-pay | Admitting: Family Medicine

## 2022-05-30 DIAGNOSIS — Z1231 Encounter for screening mammogram for malignant neoplasm of breast: Secondary | ICD-10-CM

## 2022-05-30 NOTE — Progress Notes (Signed)
Chronic Care Management Pharmacy Assistant    Name: Makiyla Linch  MRN: [id number] DOB: [birth year]  Susan Miles is an 76 y.o. year old female who presents for her initial CCM visit with the clinical pharmacist.  Reason for Encounter: Chart Prep for Initial visit with CPP on 06/01/22 @ 11 am    Recent office visits:  04/21/22 [name] MD - Family Medicine - Diabetes - Labs were ordered. No medication changes were noted. Follow up in 3 months.   04/07/22 [name]. MD - Family Medicine - Mixed hyperlipidemia - Labs were ordered. EKG performed. Referral to cardiology. Follow up in 2 weeks.   12/13/21 [name], MD - Family Medicine - Cough - AIC ordered. Atorvastatin (LIPITOR) 20 MG tablet and Dapagliflozin propanediol (FARXIGA) 10 MG TABS tablet prescribed. Follow up in 3 months.   12/03/2021 [name], MD - Family Medicine - Cough - CXR ordered. Amoxicillin-clavulanate (AUGMENTIN) 875-125 MG tablet, Benzonatate (TESSALON) 100 MG capsule, and promethazine-dextromethorphan (PROMETHAZINE-DM) 6.25-15 MG/5ML syrup prescribed. Follow up if no improvement.   Recent consult visits:  04/12/22 Phineas Inches MD - Cardiology - Abnormal EKG - EKG performed - Discussed wearing compression socks for edema and wearing CPAP at night to help with fatigue. Follow up as needed.   Hospital visits:  None in previous 6 months  Medications: Outpatient Encounter Medications as of 05/30/2022  Medication Sig   ACCU-CHEK AVIVA PLUS test strip USE TO TEST BLOOD SUGAR ONCE DAILY. DX E11.9   acetaminophen (TYLENOL) 500 MG tablet Take 500 mg by mouth every 6 (six) hours as needed for mild pain.   amLODipine (NORVASC) 10 MG tablet TAKE 1 TABLET BY MOUTH EVERY DAY   aspirin 81 MG tablet Take 81 mg by mouth daily.   atorvastatin (LIPITOR) 20 MG tablet TAKE 1 TABLET BY MOUTH DAILY AT 6 PM.   Blood Glucose Monitoring Suppl (LDR BLOOD GLUCOSE TRUETEST) w/Device KIT 1 Device by Does not apply  route daily. Test blood sugar once daily. Dx: E11.9   cetirizine (ZYRTEC) 10 MG tablet TAKE 1 TABLET BY MOUTH EVERY DAY   cloNIDine (CATAPRES) 0.2 MG tablet TAKE 1 TABLET BY MOUTH 2 TIMES DAILY.   doxycycline (VIBRA-TABS) 100 MG tablet Take 1 tablet (100 mg total) by mouth 2 (two) times daily.   fluconazole (DIFLUCAN) 150 MG tablet Take 1 tablet (150 mg total) by mouth daily. (Patient not taking: Reported on 05/19/2022)   fluticasone (FLONASE) 50 MCG/ACT nasal spray Place 1-2 sprays into both nostrils daily.   glipiZIDE (GLUCOTROL) 10 MG tablet Take 2 tablets (20 mg total) by mouth 2 (two) times daily before a meal.   JANUVIA 50 MG tablet TAKE 1 TABLET BY MOUTH EVERY DAY   Lancets MISC Test blood sugar once daily. Dx: E11.9   losartan (COZAAR) 100 MG tablet TAKE 1 TABLET BY MOUTH EVERY DAY   metFORMIN (GLUCOPHAGE) 1000 MG tablet TAKE 1 TABLET (1,000 MG TOTAL) BY MOUTH 2 (TWO) TIMES DAILY WITH A MEAL.   metoprolol (TOPROL-XL) 200 MG 24 hr tablet TAKE 1 TABLET BY MOUTH EVERY DAY   promethazine-dextromethorphan (PROMETHAZINE-DM) 6.25-15 MG/5ML syrup Take 2.5-5 mLs by mouth at bedtime as needed for cough.   tamoxifen (NOLVADEX) 20 MG tablet Take 1 tablet (20 mg total) by mouth daily.   triamcinolone cream (KENALOG) 0.1 % Apply 1 application topically 2 (two) times daily.   No facility-administered encounter medications on file as of 05/30/2022.     Have you seen any other  providers since your last visit?   Any changes in your medications or health?   Any side effects from any medications?   Do you have an symptoms or problems not managed by your medications?   Any concerns about your health right now?   Has your provider asked that you check blood pressure, blood sugar, or follow special diet at home?   Do you get any type of exercise on a regular basis?   Can you think of a goal you would like to reach for your health?   Do you have any problems getting your medications?   Is there  anything that you would like to discuss during the appointment?   Please bring medications and supplements to appointment    Care Gaps  AWV: done 08/03/17 (office LM 05/10/22 to schedule) Colonoscopy: due 04/22/23 DM Eye Exam: due 01/02/21 DM Foot Exam: due 04/08/23 Microalbumin: done 04/07/22 HbgAIC: done 04/07/22 (7.2) DEXA: done 10/09/01 Mammogram: done 06/23/21   Star Rating Drugs: Metformin (GLUCOPHAGE) 1000 MG tablet - last filled 05/05/22 90 days  Losartan (COZAAR) 100 MG tablet - last filled 05/05/22 90 days JANUVIA 50 MG tablet - last filled  Glipizide (GLUCOTROL) 10 MG tablet - last filled 03/01/22 90 days Atorvastatin (LIPITOR) 20 MG tablet - last filled 02/27/22 90 days   Future Appointments  Date Time Provider Tice  06/01/2022 11:00 AM [facility] CCM PHARMACIST [facility]  07/06/2022 11:15 AM Nicholas Lose, MD [facility] None  07/22/2022 11:00 AM [name], MD [facility]  05/25/2023  1:00 PM [facility] HEALTH COACH [facility]   Multiple attempts were made to contact patient. Attempts were unsuccessful. / ls,CMA    Jobe Gibbon, [place] Pharmacist Assistant  [phone]

## 2022-06-01 ENCOUNTER — Ambulatory Visit (INDEPENDENT_AMBULATORY_CARE_PROVIDER_SITE_OTHER): Payer: Medicare Other | Admitting: Pharmacist

## 2022-06-01 VITALS — BP 124/80

## 2022-06-01 DIAGNOSIS — E119 Type 2 diabetes mellitus without complications: Secondary | ICD-10-CM

## 2022-06-01 DIAGNOSIS — I10 Essential (primary) hypertension: Secondary | ICD-10-CM

## 2022-06-01 DIAGNOSIS — E785 Hyperlipidemia, unspecified: Secondary | ICD-10-CM

## 2022-06-01 NOTE — Patient Instructions (Addendum)
Visit Information   Goals Addressed             This Visit's Progress    Monitor and Manage My Blood Sugar-Diabetes Type 2       Timeframe:  Long-Range Goal Priority:  High Start Date: 06/01/22                            Expected End Date:  12/01/22                     Follow Up Date 09/01/22    - check blood sugar at prescribed times - check blood sugar before and after exercise - check blood sugar if I feel it is too high or too low    Why is this important?   Checking your blood sugar at home helps to keep it from getting very high or very low.  Writing the results in a diary or log helps the doctor know how to care for you.  Your blood sugar log should have the time, date and the results.  Also, write down the amount of insulin or other medicine that you take.  Other information, like what you ate, exercise done and how you were feeling, will also be helpful.     Notes:        Patient Care Plan: General Pharmacy (Adult)     Problem Identified: HTN, DM, HLD, Hx of Breast Cancer   Priority: High  Onset Date: 06/01/2022     Long-Range Goal: Patient-Specific Goal   Start Date: 06/01/2022  Expected End Date: 12/01/2022  This Visit's Progress: On track  Priority: High  Note:   Current Barriers:  Unable to achieve control of A1c   Pharmacist Clinical Goal(s):  Patient will verbalize ability to afford treatment regimen achieve improvement in A1c as evidenced by labs through collaboration with PharmD and provider.   Interventions: 1:1 collaboration with [name], MD regarding development and update of comprehensive plan of care as evidenced by provider attestation and co-signature Inter-disciplinary care team collaboration (see longitudinal plan of care) Comprehensive medication review performed; medication list updated in electronic medical record  Hypertension (BP goal <130/80) -Controlled, based on recent office visits and home monitoring -Current  treatment: Amlodipine '10mg'$  Appropriate, Effective, Safe, Accessible Losartan '100mg'$  Appropriate, Effective, Safe, Accessible Metoprolol XL '200mg'$  Appropriate, Effective, Safe, Accessible Clonidine 0.'2mg'$  twice daily Appropriate, Effective, Safe, Accessible -Medications previously tried: valsartan, HCTZ  -Current home readings: 124/80 - controlled normally like this at home -Current exercise habits: has elliptical at home and will do once or twice per day -Denies hypotensive/hypertensive symptoms -Educated on BP goals and benefits of medications for prevention of heart attack, stroke and kidney damage; Exercise goal of 150 minutes per week; Importance of home blood pressure monitoring; -Counseled to monitor BP at home a few times per week, document, and provide log at future appointments -Counseled on diet and exercise extensively Recommended to continue current medication Amlodipine could be causing some of her feet swelling, it resolves when she elevates.  She denies any SOB or anything associated.  Continue meds for now, FU if swelling gets worse.  Hyperlipidemia: (LDL goal < 70) -Not ideally controlled -Current treatment: Atorvastatin '20mg'$  Appropriate, Effective, Safe, Accessible -Medications previously tried: pravastatin   -Current exercise habits: see HTN -Educated on Cholesterol goals;  Benefits of statin for ASCVD risk reduction; Importance of limiting foods high in cholesterol; -Recommended to continue current medication  Not quite at LDL goal < 70 with DM.  Very borderline last time with Ldl 76.  Will continue current dose. A1c continues to improve so diet is obviously getting better.  Could consider dose increase of statin next visit.  Diabetes (A1c goal <7%) -Uncontrolled -Current medications: Januvia '50mg'$  Appropriate, Effective, Safe, Accessible Metformin '1000mg'$  bid Appropriate, Effective, Safe, Accessible Glipizide '20mg'$  twice daily Appropriate, Effective, Query Safe,   -Medications previously tried: [place] (cost)  -Current home glucose readings fasting glucose: 150s today ( she ate late last night).  Normally glucose is < 130 fasting. post prandial glucose: N/A -Denies hypoglycemic/hyperglycemic symptoms -Educated on A1c and blood sugar goals; Complications of diabetes including kidney damage, retinal damage, and cardiovascular disease; Exercise goal of 150 minutes per week; Prevention and management of hypoglycemic episodes; Benefits of routine self-monitoring of blood sugar; -Counseled to check feet daily and get yearly eye exams - she reports she just got her eye exam with Dr. Einar Gip.  Will coordinate paperwork coming over to close care gap. -Counseled on diet and exercise extensively Recommended to continue current medication Assessed patient finances. I believe patient is a good candidate for Wilder Glade due to its benefits in CKD and CV benefit.  She was approved for patient assistance through [place] [employer].  She will need prescription sent in pending PCP approval for starting this medication.  Due to improving A1c of 7.2 recently she may not need both that and Januvia.  For now consider initiating Farxiga to go along with metformin and glipizide for cost effective regimen.  In the future, could consider resuming Januvia and getting rid of glipizide to minimize hypo risk.  She was also given PAP form for Januvia and will return that once completed.  Hx of Breast Cancer (Goal: Remain in remission) -Controlled -Current treatment  Tamoxifen '20mg'$  Appropriate, Effective, Safe, Accessible -Medications previously tried: none noted -Adherent with medication, she has to take for 5 years.  -Recommended to continue current medication Denies any adverse effects  Patient Goals/Self-Care Activities Patient will:  - take medications as prescribed as evidenced by patient report and record review check glucose fasting, document, and provide at future  appointments collaborate with provider on medication access solutions  Follow Up Plan: The care management team will reach out to the patient again over the next 180 days.       Susan Miles was given information about Chronic Care Management services today including:  CCM service includes personalized support from designated clinical staff supervised by her physician, including individualized plan of care and coordination with other care providers 24/7 contact phone numbers for assistance for urgent and routine care needs. Standard insurance, coinsurance, copays and deductibles apply for chronic care management only during months in which we provide at least 20 minutes of these services. Most insurances cover these services at 100%, however patients may be responsible for any copay, coinsurance and/or deductible if applicable. This service may help you avoid the need for more expensive face-to-face services. Only one practitioner may furnish and bill the service in a calendar month. The patient may stop CCM services at any time (effective at the end of the month) by phone call to the office staff.  Patient agreed to services and verbal consent obtained.   The patient verbalized understanding of instructions, educational materials, and care plan provided today and agreed to receive a mailed copy of patient instructions, educational materials, and care plan.  Telephone follow up appointment with pharmacy team member scheduled for: 6 months  Edythe Clarity, [place], PharmD Clinical Pharmacist  [facility] [phone]

## 2022-06-03 DIAGNOSIS — E1159 Type 2 diabetes mellitus with other circulatory complications: Secondary | ICD-10-CM

## 2022-06-03 DIAGNOSIS — I10 Essential (primary) hypertension: Secondary | ICD-10-CM

## 2022-06-03 DIAGNOSIS — Z7984 Long term (current) use of oral hypoglycemic drugs: Secondary | ICD-10-CM | POA: Diagnosis not present

## 2022-06-03 DIAGNOSIS — E785 Hyperlipidemia, unspecified: Secondary | ICD-10-CM

## 2022-06-13 NOTE — Congregational Nurse Program (Signed)
  Dept: [phone]   Congregational Nurse Program Note  Date of Encounter: 04/13/2022  Past Medical History: Past Medical History:  Diagnosis Date   Breast cancer ([place])    Cancer ([place])    right breast cancer   Diabetes mellitus without complication ([place])    Family history of breast cancer    Hypertension    Personal history of radiation therapy    Sleep apnea    does not use CPAP    Encounter Details: CCNP Questionnaire completed. Patient wanted to get weighed as she is on a weight loss journey. Patient discussed some of the challenges with weight loss and her goals for achieving goals.   Andrez Grime, BSN, RN, CRRN,CMSRN

## 2022-06-15 NOTE — Congregational Nurse Program (Signed)
  Dept: [phone]   Congregational Nurse Program Note  Date of Encounter: 05/18/2022  Past Medical History: Past Medical History:  Diagnosis Date   Breast cancer ([place])    Cancer ([place])    right breast cancer   Diabetes mellitus without complication ([place])    Family history of breast cancer    Hypertension    Personal history of radiation therapy    Sleep apnea    does not use CPAP    Encounter Details:  CNP Questionnaire - 05/18/22 1500       Questionnaire   Do you give verbal consent to treat you today? Yes    Location Patient Served  Not Applicable   [facility] [place] or Organization    Patient Status Unknown    Insurance Unknown    Insurance Referral Medicare    Medication N/A    Medical Provider Yes    Screening Referrals N/A    Medical Referral N/A    Medical Appointment Made N/A    Food N/A    Transportation N/A    Housing/Utilities N/A    Interpersonal Safety N/A    Intervention Spiritual Care;Support;Educate;Counsel    ED Visit Averted N/A    Life-Saving Intervention Made N/A            patient denied vitals. Patient wanted to discuss with congregational nurse concerning her PCP stopping her metformin.The patient also stated she would be having a ultrasound coming in a further appointment. I sat and discussed with the patient some concerns about the stopping of metformin. We formulated some questions to take with her to her next doctors appointment.   Andrez Grime, BSN, RN, CRRN,CMSRN

## 2022-06-21 ENCOUNTER — Other Ambulatory Visit: Payer: Self-pay | Admitting: Family Medicine

## 2022-06-21 DIAGNOSIS — E119 Type 2 diabetes mellitus without complications: Secondary | ICD-10-CM

## 2022-06-21 MED ORDER — DAPAGLIFLOZIN PROPANEDIOL 5 MG PO TABS: 5.0000 mg | ORAL_TABLET | Freq: Every day | ORAL | 0 refills | Status: DC

## 2022-06-21 NOTE — Progress Notes (Signed)
See last visit with pharmacist. Will rx farxiga.

## 2022-06-22 DIAGNOSIS — Z139 Encounter for screening, unspecified: Secondary | ICD-10-CM

## 2022-06-22 LAB — GLUCOSE, POCT (MANUAL RESULT ENTRY): POC Glucose: 141 mg/dl — AB (ref 70–99)

## 2022-06-24 ENCOUNTER — Ambulatory Visit
Admission: RE | Admit: 2022-06-24 | Discharge: 2022-06-24 | Disposition: A | Discharge status: Home / Self Care | Payer: Medicare Other | Source: Ambulatory Visit | Attending: Family Medicine | Admitting: Family Medicine

## 2022-06-24 DIAGNOSIS — Z1231 Encounter for screening mammogram for malignant neoplasm of breast: Secondary | ICD-10-CM

## 2022-07-01 NOTE — Progress Notes (Signed)
Patient Care Team: [name], MD as PCP - General (Family Medicine) Webb Laws, [place] as Referring Physician (Optometry) Nicholas Lose, MD as Consulting Physician (Hematology and Oncology) Kyung Rudd, MD as Consulting Physician (Radiation Oncology) Alphonsa Overall, MD as Consulting Physician (General Surgery) Edythe Clarity, [facility] as Pharmacist (Pharmacist)  DIAGNOSIS: No diagnosis found.  SUMMARY OF ONCOLOGIC HISTORY: Oncology History  Ductal carcinoma in situ (DCIS) of right breast  07/04/2019 Initial Diagnosis   Routine screening mammogram detected 1.6cm mass in the right breast at the 9 o'clock position, loosely group calcifications in the upper right breast spanning 3.6cm, and no axillary adenopathy. Biopsy showed intermediate grade DCIS, ER 100%, PR 100%.    07/10/2019 Cancer Staging   Staging form: Breast, AJCC 8th Edition - Clinical stage from 07/10/2019: Stage 0 (cTis (DCIS), cN0, cM0, ER+, PR+, HER2: Not Assessed) - Signed by Nicholas Lose, MD on 07/10/2019   08/09/2019 Surgery   Right lumpectomy Lucia Gaskins) [phone]): multifocal low grade DCIS spanning 1.4cm and 0.9cm, clear margins.    09/09/2019 - 10/04/2019 Radiation Therapy   The patient initially received a dose of 42.56 Gy in 16 fractions to the breast using whole-breast tangent fields. This was delivered using a 3-D conformal technique. The patient then received a boost to the seroma. This delivered an additional 8 Gy in 65factions using a 3 field photon technique due to the depth of the seroma. The total dose was 50.56 Gy.   10/2019 - 10/2024 Anti-estrogen oral therapy   Tamoxifen daily     CHIEF COMPLIANT:Follow-up of right breast DCIS on tamoxifen   INTERVAL HISTORY: Susan Stankiewiczis a 76y.o. with above-mentioned history of right breast DCIS. She presents to the clinic today for a follow-up.    ALLERGIES:  is allergic to ace inhibitors.  MEDICATIONS:  Current Outpatient Medications  Medication  Sig Dispense Refill   ACCU-CHEK AVIVA PLUS test strip USE TO TEST BLOOD SUGAR ONCE DAILY. DX E11.9 100 strip 2   acetaminophen (TYLENOL) 500 MG tablet Take 500 mg by mouth every 6 (six) hours as needed for mild pain.     amLODipine (NORVASC) 10 MG tablet TAKE 1 TABLET BY MOUTH EVERY DAY 90 tablet 1   aspirin 81 MG tablet Take 81 mg by mouth daily.     atorvastatin (LIPITOR) 20 MG tablet TAKE 1 TABLET BY MOUTH DAILY AT 6 PM. 90 tablet 1   Blood Glucose Monitoring Suppl (LDR BLOOD GLUCOSE TRUETEST) w/Device KIT 1 Device by Does not apply route daily. Test blood sugar once daily. Dx: E11.9 1 kit 0   cetirizine (ZYRTEC) 10 MG tablet TAKE 1 TABLET BY MOUTH EVERY DAY 90 tablet 0   cloNIDine (CATAPRES) 0.2 MG tablet TAKE 1 TABLET BY MOUTH 2 TIMES DAILY. 180 tablet 1   dapagliflozin propanediol (FARXIGA) 5 MG TABS tablet Take 1 tablet (5 mg total) by mouth daily before breakfast. 30 tablet 0   doxycycline (VIBRA-TABS) 100 MG tablet Take 1 tablet (100 mg total) by mouth 2 (two) times daily. 20 tablet 0   fluconazole (DIFLUCAN) 150 MG tablet Take 1 tablet (150 mg total) by mouth daily. 1 tablet 0   fluticasone (FLONASE) 50 MCG/ACT nasal spray Place 1-2 sprays into both nostrils daily. 16 g 6   glipiZIDE (GLUCOTROL) 10 MG tablet Take 2 tablets (20 mg total) by mouth 2 (two) times daily before a meal. 360 tablet 1   JANUVIA 50 MG tablet TAKE 1 TABLET BY MOUTH EVERY DAY 90  tablet 1   Lancets MISC Test blood sugar once daily. Dx: E11.9 100 each 3   losartan (COZAAR) 100 MG tablet TAKE 1 TABLET BY MOUTH EVERY DAY 90 tablet 1   metFORMIN (GLUCOPHAGE) 1000 MG tablet TAKE 1 TABLET (1,000 MG TOTAL) BY MOUTH 2 (TWO) TIMES DAILY WITH A MEAL. 180 tablet 1   metoprolol (TOPROL-XL) 200 MG 24 hr tablet TAKE 1 TABLET BY MOUTH EVERY DAY 90 tablet 1   promethazine-dextromethorphan (PROMETHAZINE-DM) 6.25-15 MG/5ML syrup Take 2.5-5 mLs by mouth at bedtime as needed for cough. 118 mL 0   tamoxifen (NOLVADEX) 20 MG tablet Take  1 tablet (20 mg total) by mouth daily. 90 tablet 3   triamcinolone cream (KENALOG) 0.1 % Apply 1 application topically 2 (two) times daily. 30 g 1   No current facility-administered medications for this visit.    PHYSICAL EXAMINATION: ECOG PERFORMANCE STATUS: {CHL ONC ECOG PS:[phone]}  There were no vitals filed for this visit. There were no vitals filed for this visit.  BREAST:*** No palpable masses or nodules in either right or left breasts. No palpable axillary supraclavicular or infraclavicular adenopathy no breast tenderness or nipple discharge. (exam performed in the presence of a chaperone)  LABORATORY DATA:  I have reviewed the data as listed    Latest Ref Rng & Units 04/07/2022   11:56 AM 09/09/2021    2:19 PM 12/15/2020   11:11 AM  CMP  Glucose 70 - 99 mg/dL 117  72  186   BUN 6 - 23 mg/dL '18  13  14   ' Creatinine 0.40 - 1.20 mg/dL 0.95  0.99  1.04   Sodium 135 - 145 mEq/L 139  138  140   Potassium 3.5 - 5.1 mEq/L 4.2  4.0  4.3   Chloride 96 - 112 mEq/L 105  106  107   CO2 19 - 32 mEq/L '27  24  22   ' Calcium 8.4 - 10.5 mg/dL 10.5  10.2  10.2   Total Protein 6.0 - 8.3 g/dL 7.1  7.2  6.4   Total Bilirubin 0.2 - 1.2 mg/dL 0.4  0.4  0.4   Alkaline Phos 39 - 117 U/L 52  49  56   AST 0 - 37 U/L '21  27  20   ' ALT 0 - 35 U/L '20  26  21     ' Lab Results  Component Value Date   WBC 4.4 04/07/2022   HGB 12.9 04/07/2022   HCT 40.1 04/07/2022   MCV 87.1 04/07/2022   PLT 248.0 04/07/2022   NEUTROABS 3.0 07/10/2019    ASSESSMENT & PLAN:  No problem-specific Assessment & Plan notes found for this encounter.    No orders of the defined types were placed in this encounter.  The patient has a good understanding of the overall plan. she agrees with it. she will call with any problems that may develop before the next visit here. Total time spent: 30 mins including face to face time and time spent for planning, charting and co-ordination of care   Susan Miles,  [place] 07/01/22    I Gardiner Coins am scribing for Dr. Lindi Adie  ***

## 2022-07-06 ENCOUNTER — Inpatient Hospital Stay: Payer: Medicare Other | Attending: Hematology and Oncology | Admitting: Hematology and Oncology

## 2022-07-06 ENCOUNTER — Other Ambulatory Visit: Payer: Self-pay

## 2022-07-06 DIAGNOSIS — D0511 Intraductal carcinoma in situ of right breast: Secondary | ICD-10-CM | POA: Diagnosis not present

## 2022-07-06 DIAGNOSIS — Z853 Personal history of malignant neoplasm of breast: Secondary | ICD-10-CM | POA: Diagnosis not present

## 2022-07-06 MED ORDER — TAMOXIFEN CITRATE 20 MG PO TABS: 20.0000 mg | ORAL_TABLET | Freq: Every day | ORAL | 3 refills | Status: DC

## 2022-07-06 NOTE — Assessment & Plan Note (Signed)
07/04/2019:Routine screening mammogram detected 1.6cm mass in the right breast at the 9 o'clock position, loosely group calcifications in the upper right breast spanning 3.6cm, and no axillary adenopathy. Biopsy showed intermediate grade DCIS, ER 100%, PR 100%. Tix Nx Stage 0  Treatmentsummary: 1.08/09/2019: Right lumpectomy: DCIS with calcifications, low-grade, 1.4 cm, ALH, margins negative, second lumpectomy: DCIS with calcifications, low-grade, 0.9 cm, ER 100%, PR 100%, Tis NX stage 0 2.Adjuvant radiation therapywill be completed 10/04/2019 3.Adjuvant antiestrogen therapy with tamoxifen x5 yearsstarted 09/30/2019  Tamoxifentoxicities: No side effects  Breast cancer surveillance: 1.Breast exam 07/06/2022: Benign 2.Mammogram 06/27/2022: Benign breast density category B  Return to clinic in 1 year for follow-up

## 2022-07-07 ENCOUNTER — Telehealth: Payer: Self-pay | Admitting: Hematology and Oncology

## 2022-07-07 ENCOUNTER — Telehealth: Payer: Self-pay | Admitting: Pharmacist

## 2022-07-07 ENCOUNTER — Other Ambulatory Visit: Payer: Self-pay

## 2022-07-07 ENCOUNTER — Other Ambulatory Visit: Payer: Self-pay | Admitting: Family Medicine

## 2022-07-07 DIAGNOSIS — E119 Type 2 diabetes mellitus without complications: Secondary | ICD-10-CM

## 2022-07-07 MED ORDER — DAPAGLIFLOZIN PROPANEDIOL 5 MG PO TABS: 5.0000 mg | ORAL_TABLET | Freq: Every day | ORAL | 1 refills | Status: DC

## 2022-07-07 MED ORDER — DAPAGLIFLOZIN PROPANEDIOL 5 MG PO TABS: 5.0000 mg | ORAL_TABLET | Freq: Every day | ORAL | 0 refills | Status: DC

## 2022-07-07 MED ORDER — DAPAGLIFLOZIN PROPANEDIOL 5 MG PO TABS
5.0000 mg | ORAL_TABLET | Freq: Every day | ORAL | 2 refills | Status: DC
Start: 2022-07-07 — End: 2022-07-07

## 2022-07-07 NOTE — Progress Notes (Signed)
    Chronic Care Management Pharmacy Assistant   Name: Susan Miles  MRN: [id number] DOB: August 27, 1946   Reason for Encounter: Follow up on Farxiga PAP   I contacted [employer] to check on status of shipment for patient. Rep stated they received her Farxiga rx but it was for 30 days with no refills. She stated a new rx would be required with additional refills in order for it to be processed. She stated then it would be 10-14 before patient would receive her shipment. I informed CPP of status and a new rx will be requested and sent on patients behalf. CPP will also check to see if there is a voucher patient may can use at a local retail pharmacy in the meantime.    Jobe Gibbon, [facility] Clinical Pharmacist Assistant  [phone]

## 2022-07-07 NOTE — Progress Notes (Signed)
Refilled Wilder Glade to [facility] pharmacy and local 30-day supply.

## 2022-07-07 NOTE — Telephone Encounter (Signed)
Scheduled appointment per 8/2 los. Left voicemail.

## 2022-07-20 DIAGNOSIS — Z139 Encounter for screening, unspecified: Secondary | ICD-10-CM

## 2022-07-20 LAB — GLUCOSE, POCT (MANUAL RESULT ENTRY): POC Glucose: 156 mg/dl — AB (ref 70–99)

## 2022-07-22 ENCOUNTER — Ambulatory Visit: Payer: Medicare Other | Admitting: Family Medicine

## 2022-07-29 DIAGNOSIS — L02219 Cutaneous abscess of trunk, unspecified: Secondary | ICD-10-CM | POA: Insufficient documentation

## 2022-08-01 ENCOUNTER — Ambulatory Visit (INDEPENDENT_AMBULATORY_CARE_PROVIDER_SITE_OTHER): Payer: Medicare Other | Admitting: Family Medicine

## 2022-08-01 ENCOUNTER — Encounter: Payer: Self-pay | Admitting: Family Medicine

## 2022-08-01 DIAGNOSIS — E119 Type 2 diabetes mellitus without complications: Secondary | ICD-10-CM

## 2022-08-01 DIAGNOSIS — J309 Allergic rhinitis, unspecified: Secondary | ICD-10-CM

## 2022-08-01 DIAGNOSIS — E1129 Type 2 diabetes mellitus with other diabetic kidney complication: Secondary | ICD-10-CM

## 2022-08-01 DIAGNOSIS — I10 Essential (primary) hypertension: Secondary | ICD-10-CM | POA: Diagnosis not present

## 2022-08-01 DIAGNOSIS — R809 Proteinuria, unspecified: Secondary | ICD-10-CM | POA: Diagnosis not present

## 2022-08-01 LAB — POCT GLYCOSYLATED HEMOGLOBIN (HGB A1C): HbA1c, POC (controlled diabetic range): 7.4 % — AB (ref 0.0–7.0)

## 2022-08-01 MED ORDER — METFORMIN HCL 1000 MG PO TABS: 1000.0000 mg | ORAL_TABLET | Freq: Two times a day (BID) | ORAL | 1 refills | Status: DC

## 2022-08-01 MED ORDER — METOPROLOL SUCCINATE ER 200 MG PO TB24: 200.0000 mg | ORAL_TABLET | Freq: Every day | ORAL | 1 refills | Status: DC

## 2022-08-01 MED ORDER — LOSARTAN POTASSIUM 100 MG PO TABS: 100.0000 mg | ORAL_TABLET | Freq: Every day | ORAL | 1 refills | Status: DC

## 2022-08-01 MED ORDER — FLUTICASONE PROPIONATE 50 MCG/ACT NA SUSP: 1.0000 | Freq: Every day | NASAL | 6 refills | Status: DC

## 2022-08-01 MED ORDER — DAPAGLIFLOZIN PROPANEDIOL 5 MG PO TABS: 5.0000 mg | ORAL_TABLET | Freq: Every day | ORAL | 0 refills | Status: DC

## 2022-08-01 MED ORDER — FEXOFENADINE HCL 180 MG PO TABS: 180.0000 mg | ORAL_TABLET | Freq: Every day | ORAL | 3 refills | Status: DC

## 2022-08-01 MED ORDER — AMLODIPINE BESYLATE 10 MG PO TABS: 10.0000 mg | ORAL_TABLET | Freq: Every day | ORAL | 1 refills | Status: DC

## 2022-08-01 NOTE — Progress Notes (Unsigned)
Subjective:  Patient ID: Susan Miles, female    DOB: September 12, 1946  Age: 76 y.o. MRN: [id number]  CC:  Chief Complaint  Patient presents with   Diabetes   Hypertension    Pt states all is well    HPI Susan Miles presents for   Diabetes: With microalbuminuria. CKD, hyperglycemia. Followed by pharmacist as well, assisted with coverage for farxiga, januvia.  Currently taking: Metformin 10927m BID Glipizide 237mBID.  Farxiga 27m40md - no new yeast infections or urinary symptoms.  Working on getting Januvia covered - not yet taking. Higher readings after switch.  On statin with lipitor, losartan for ARB.  Home readings fasting: 140-190 Postprandial: 140-190 Symptomatic lows. None.   Microalbumin: 7.0 on 04/07/22.  Optho, foot exam, pneumovax: up to date.  Flu vaccine today.  Covid booster - February of this year, repeat option discussed.   Shingrix at pharmacy - s/p 1 injection. Plans on 2nd in December.    Lab Results  Component Value Date   HGBA1C 7.4 (A) 08/01/2022   HGBA1C 7.2 (H) 04/07/2022   HGBA1C 8.0 (H) 12/13/2021   Lab Results  Component Value Date   MICROALBUR 7.0 (H) 04/07/2022   LDLCALC 76 04/07/2022   CREATININE 0.95 04/07/2022   Hypertension: With history of peripheral edema versus lymphedema, evaluated by cardiology previously with as needed follow-up, no sign of ischemia.  Sodium avoidance discussed for pedal edema. Some swelling in foot only. Avoiding salt.  Treated with clonidine 0.2 mg twice daily, losartan 100 mg daily, amlodipine 10 mg daily, Toprol-XL 200 mg daily. Home readings: 120/78-130/80's.  BP Readings from Last 3 Encounters:  08/01/22 138/78  07/06/22 138/70  06/01/22 124/80   Lab Results  Component Value Date   CREATININE 0.95 04/07/2022   Followed by Dr. GudLindi Adieth history of ductal carcinoma in situ of right breast, last visit 07-06-22.  1 year follow-up planned.  Status postlumpectomy, second lumpectomy, adjuvant  radiation, adjuvant antiestrogen therapy  - continued on tamoxifen for 5 years starting 09/30/2019.  Mammogram 06/27/2022  Allergic rhinitis: Doing ok, but would like to try different antihistamine, still sneezing, nasal congestion.using 2 sprays flonase.   History Patient Active Problem List   Diagnosis Date Noted   Cellulitis and abscess of trunk 07/29/2022   Family history of breast cancer    Ductal carcinoma in situ (DCIS) of right breast 07/04/2019   Hyperlipidemia 12/21/2017   Breast asymmetry 05/01/2015   Cataract, nuclear 05/24/2012   Benign hypertension 02/10/2012   Type 2 diabetes mellitus without complications ([facility]CCHenry3/44/92/0100Past Medical History:  Diagnosis Date   [place] ([facility]  Cancer ([place]  right breast cancer   Diabetes mellitus without complication ([place]  Family history of breast cancer    Hypertension    Personal history of radiation therapy    Sleep apnea    does not use CPAP   Past Surgical History:  Procedure Laterality Date   ABDOMINAL HYSTERECTOMY     BREAST LUMPECTOMY     BREAST LUMPECTOMY WITH RADIOACTIVE SEED LOCALIZATION Right 08/09/2019   Procedure: RIGHT BREAST LUMPECTOMY X 2 WITH RADIOACTIVE SEED LOCALIZATION (3 SEEDS);  Surgeon: NewAlphonsa OverallD;  [facility]  Laterality: Right;   Allergies  Allergen Reactions   Ace Inhibitors Cough   Prior to Admission medications   Medication Sig Start Date End Date Taking? Authorizing Provider  ACCU-CHEK AVIVA PLUS test strip USE  TO TEST BLOOD SUGAR ONCE DAILY. DX E11.9 05/25/22  Yes [name], MD  acetaminophen (TYLENOL) 500 MG tablet Take 500 mg by mouth every 6 (six) hours as needed for mild pain.   Yes [provider]  amLODipine (NORVASC) 10 MG tablet TAKE 1 TABLET BY MOUTH EVERY DAY 05/25/22  Yes [name], MD  aspirin 81 MG tablet Take 81 mg by mouth daily.   Yes [provider]  Blood Glucose Monitoring Suppl  (LDR BLOOD GLUCOSE TRUETEST) w/Device KIT 1 Device by Does not apply route daily. Test blood sugar once daily. Dx: E11.9 05/13/20  Yes [name], MD  cetirizine (ZYRTEC) 10 MG tablet TAKE 1 TABLET BY MOUTH EVERY DAY 05/25/22  Yes [name], MD  cloNIDine (CATAPRES) 0.2 MG tablet TAKE 1 TABLET BY MOUTH 2 TIMES DAILY. 05/05/22  Yes [name], MD  dapagliflozin propanediol (FARXIGA) 5 MG TABS tablet Take 1 tablet (5 mg total) by mouth daily before breakfast. 07/07/22  Yes [name], MD  fluticasone [facility]) 50 MCG/ACT nasal spray Place 1-2 sprays into both nostrils daily. 04/02/18  Yes [name], MD  glipiZIDE (GLUCOTROL) 10 MG tablet Take 2 tablets (20 mg total) by mouth 2 (two) times daily before a meal. 12/13/21  Yes [name], MD  Lancets MISC Test blood sugar once daily. Dx: E11.9 01/24/18  Yes [name], MD  losartan (COZAAR) 100 MG tablet TAKE 1 TABLET BY MOUTH EVERY DAY 05/05/22  Yes [name], MD  metFORMIN (GLUCOPHAGE) 1000 MG tablet TAKE 1 TABLET (1,000 MG TOTAL) BY MOUTH 2 (TWO) TIMES DAILY WITH A MEAL. 05/05/22  Yes [name], MD  metoprolol (TOPROL-XL) 200 MG 24 hr tablet TAKE 1 TABLET BY MOUTH EVERY DAY 05/16/22  Yes [name], MD  promethazine-dextromethorphan (PROMETHAZINE-DM) 6.25-15 MG/5ML syrup Take 2.5-5 mLs by mouth at bedtime as needed for cough. 12/03/21  Yes [name], MD  tamoxifen (NOLVADEX) 20 MG tablet Take 1 tablet (20 mg total) by mouth daily. 07/06/22  Yes Nicholas Lose, MD  fluconazole (DIFLUCAN) 150 MG tablet Take 1 tablet (150 mg total) by mouth daily. Patient not taking: Reported on 08/01/2022 09/09/21   [name], MD  JANUVIA 50 MG tablet TAKE 1 TABLET BY MOUTH EVERY DAY Patient not taking: Reported on 08/01/2022 05/25/22   [name], MD   Social History   Socioeconomic History   Marital status: Married    Spouse name: Not on file   Number of children: Not on file   Years of  education: Not on file   Highest education level: Not on file  Occupational History   Not on file  Tobacco Use   Smoking status: Never   Smokeless tobacco: Never  Substance and Sexual Activity   Alcohol use: No   Drug use: No   Sexual activity: Not on file  Other Topics Concern   Not on file  Social History Narrative   Not on file   Social Determinants of Health   Financial Resource Strain: Medium Risk (05/19/2022)   Overall Financial Resource Strain (CARDIA)    Difficulty of Paying Living Expenses: Somewhat hard  Food Insecurity: No Food Insecurity (05/19/2022)   Hunger Vital Sign    Worried About Running Out of Food in the Last Year: Never true    Ran Out of Food in the Last Year: Never true  Transportation Needs: No Transportation Needs (05/19/2022)  PRAPARE - Hydrologist (Medical): No    Lack of Transportation (Non-Medical): No  Physical Activity: Insufficiently Active (05/19/2022)   Exercise Vital Sign    Days of Exercise per Week: 3 days    Minutes of Exercise per Session: 30 min  Stress: No Stress Concern Present (05/19/2022)   [place]    Feeling of Stress : Only a little  Social Connections: Moderately Integrated (05/19/2022)   Social Connection and Isolation Panel [NHANES]    Frequency of Communication with Friends and Family: Three times a week    Frequency of Social Gatherings with Friends and Family: Three times a week    Attends Religious Services: More than 4 times per year    Active Member of Clubs or Organizations: No    Attends Archivist Meetings: Never    Marital Status: Married  Human resources officer Violence: Not At Risk (05/19/2022)   Humiliation, Afraid, Rape, and Kick questionnaire    Fear of Current or Ex-Partner: No    Emotionally Abused: No    Physically Abused: No    Sexually Abused: No    Review of Systems  Constitutional:  Negative for  fatigue and unexpected weight change.  Respiratory:  Negative for chest tightness and shortness of breath.   Cardiovascular:  Negative for chest pain, palpitations and leg swelling.  Gastrointestinal:  Negative for abdominal pain and blood in stool.  Neurological:  Negative for dizziness, syncope, light-headedness and headaches.    Objective:   Vitals:   08/01/22 1043  BP: 138/78  Pulse: 66  Temp: 97.8 F (36.6 C)  SpO2: 97%  Weight: 197 lb (89.4 kg)  Height: '5\' 4"'  (1.626 m)    Physical Exam Vitals reviewed.  Constitutional:      Appearance: Normal appearance. She is well-developed.  HENT:     Head: Normocephalic and atraumatic.  Eyes:     Conjunctiva/sclera: Conjunctivae normal.     Pupils: Pupils are equal, round, and reactive to light.  Neck:     Vascular: No carotid bruit.  Cardiovascular:     Rate and Rhythm: Normal rate and regular rhythm.     Heart sounds: Normal heart sounds.  Pulmonary:     Effort: Pulmonary effort is normal.     Breath sounds: Normal breath sounds.  Abdominal:     Palpations: Abdomen is soft. There is no pulsatile mass.     Tenderness: There is no abdominal tenderness.  Musculoskeletal:     Right lower leg: No edema.     Left lower leg: No edema.  Skin:    General: Skin is warm and dry.  Neurological:     Mental Status: She is alert and oriented to person, place, and time.  Psychiatric:        Mood and Affect: Mood normal.        Behavior: Behavior normal.    Results for orders placed or performed in visit on 08/01/22  POCT glycosylated hemoglobin (Hb A1C)  Result Value Ref Range   Hemoglobin A1C     HbA1c POC (<> result, manual entry)     HbA1c, POC (prediabetic range)     HbA1c, POC (controlled diabetic range) 7.4 (A) 0.0 - 7.0 %    Assessment & Plan:  Susan Miles is a 76 y.o. female . Essential hypertension - Plan: amLODipine (NORVASC) 10 MG tablet, losartan (COZAAR) 100 MG tablet, metoprolol (TOPROL-XL) 200 MG 24 hr  tablet  Allergic rhinitis - Plan: fluticasone (FLONASE) 50 MCG/ACT nasal spray, fexofenadine (ALLEGRA) 180 MG tablet  Type 2 diabetes mellitus with microalbuminuria, without long-term current use of insulin (HCC) - Plan: metFORMIN (GLUCOPHAGE) 1000 MG tablet, POCT glycosylated hemoglobin (Hb A1C)  Type 2 diabetes mellitus without complication, without long-term current use of insulin ([place]) - Plan: dapagliflozin propanediol (FARXIGA) 5 MG TABS tablet   Meds ordered this encounter  Medications   amLODipine (NORVASC) 10 MG tablet    Sig: Take 1 tablet (10 mg total) by mouth daily.    Dispense:  90 tablet    Refill:  1   fluticasone (FLONASE) 50 MCG/ACT nasal spray    Sig: Place 1-2 sprays into both nostrils daily.    Dispense:  16 g    Refill:  6   losartan (COZAAR) 100 MG tablet    Sig: Take 1 tablet (100 mg total) by mouth daily.    Dispense:  90 tablet    Refill:  1   metFORMIN (GLUCOPHAGE) 1000 MG tablet    Sig: Take 1 tablet (1,000 mg total) by mouth 2 (two) times daily with a meal.    Dispense:  180 tablet    Refill:  1   metoprolol (TOPROL-XL) 200 MG 24 hr tablet    Sig: Take 1 tablet (200 mg total) by mouth daily.    Dispense:  90 tablet    Refill:  1   fexofenadine (ALLEGRA) 180 MG tablet    Sig: Take 1 tablet (180 mg total) by mouth daily.    Dispense:  90 tablet    Refill:  3   dapagliflozin propanediol (FARXIGA) 5 MG TABS tablet    Sig: Take 1 tablet (5 mg total) by mouth daily before breakfast.    Dispense:  30 tablet    Refill:  0   Patient Instructions  Based on A1c today I think it is reasonable to wait to see if Januvia will be covered.  I kept other meds at same doses for now.  Follow-up in 3 months for now if there are questions or issues in the meantime.  Take care.     Signed,   [name], MD [place] 08/01/22 2:36 PM

## 2022-08-01 NOTE — Patient Instructions (Signed)
Based on A1c today I think it is reasonable to wait to see if Januvia will be covered.  I kept other meds at same doses for now.  Follow-up in 3 months for now if there are questions or issues in the meantime.  Take care.

## 2022-08-02 ENCOUNTER — Encounter: Payer: Self-pay | Admitting: Family Medicine

## 2022-08-10 DIAGNOSIS — Z139 Encounter for screening, unspecified: Secondary | ICD-10-CM

## 2022-08-10 LAB — GLUCOSE, POCT (MANUAL RESULT ENTRY): POC Glucose: 150 mg/dl — AB (ref 70–99)

## 2022-08-29 DIAGNOSIS — N182 Chronic kidney disease, stage 2 (mild): Secondary | ICD-10-CM | POA: Diagnosis not present

## 2022-09-04 DIAGNOSIS — Z139 Encounter for screening, unspecified: Secondary | ICD-10-CM

## 2022-09-04 LAB — GLUCOSE, POCT (MANUAL RESULT ENTRY): POC Glucose: 178 mg/dL — AB (ref 70–99)

## 2022-09-05 ENCOUNTER — Telehealth: Payer: Self-pay | Admitting: Pharmacist

## 2022-09-05 NOTE — Chronic Care Management (AMB) (Signed)
Patient called to inform me she received the Januvia for free and now has enough until the end of the year.  Now getting Wilder Glade and Januvia at no charge.    We had previously discussed d/c glipizide when she got Januvia to avoid hypo risk but last A1c uncontrolled.  Do we want her to stop the glipizide when she starts the Januvia or take in addition to? We could also look at increasing [place] if she needs additional control.  Beverly Milch, PharmD Clinical Pharmacist  [facility] [phone]

## 2022-09-06 NOTE — Telephone Encounter (Signed)
Spoke w/ pt and advised to stop the  Glipizide and if readings increase she is to let us know . She expressed verbal understanding

## 2022-09-06 NOTE — Telephone Encounter (Signed)
Please have patient stop glipizide for now if taking both [place] and farxiga. If readings are increasing, let me know.

## 2022-09-07 ENCOUNTER — Other Ambulatory Visit: Payer: Self-pay | Admitting: Lab

## 2022-09-07 ENCOUNTER — Other Ambulatory Visit: Payer: Self-pay | Admitting: Family Medicine

## 2022-09-07 DIAGNOSIS — E782 Mixed hyperlipidemia: Secondary | ICD-10-CM

## 2022-09-07 DIAGNOSIS — E1129 Type 2 diabetes mellitus with other diabetic kidney complication: Secondary | ICD-10-CM

## 2022-09-07 MED ORDER — GLIPIZIDE 10 MG PO TABS: 20.0000 mg | ORAL_TABLET | Freq: Two times a day (BID) | ORAL | 1 refills | Status: DC

## 2022-09-07 MED ORDER — ATORVASTATIN CALCIUM 10 MG PO TABS: 10.0000 mg | ORAL_TABLET | Freq: Every day | ORAL | 1 refills | Status: DC

## 2022-09-09 DIAGNOSIS — R809 Proteinuria, unspecified: Secondary | ICD-10-CM | POA: Diagnosis not present

## 2022-09-09 DIAGNOSIS — I129 Hypertensive chronic kidney disease with stage 1 through stage 4 chronic kidney disease, or unspecified chronic kidney disease: Secondary | ICD-10-CM | POA: Diagnosis not present

## 2022-09-09 DIAGNOSIS — E1122 Type 2 diabetes mellitus with diabetic chronic kidney disease: Secondary | ICD-10-CM | POA: Diagnosis not present

## 2022-09-09 DIAGNOSIS — N182 Chronic kidney disease, stage 2 (mild): Secondary | ICD-10-CM | POA: Diagnosis not present

## 2022-09-13 NOTE — Congregational Nurse Program (Signed)
  Dept: [phone]   Congregational Nurse Program Note  Date of Encounter: 05/25/2022  Past Medical History: Past Medical History:  Diagnosis Date   Breast cancer ([place])    Cancer ([place])    right breast cancer   Diabetes mellitus without complication ([place])    Family history of breast cancer    Hypertension    Personal history of radiation therapy    Sleep apnea    does not use CPAP    Encounter Details:  CCNP Questionnaire completed in medical record. Reviewed with patient her blood pressure was slightly elevated. Verbal education given about hypertension and risk factors contributing to hypertension. Discussed with patient she is working with the pharmacy about changing medication and making medications affordable. Patient states she is excited about the assistance and are in the process of filling out the documentation needed. Was able to advocate/counsel for patient during this time

## 2022-09-26 NOTE — Congregational Nurse Program (Signed)
  Dept: [phone]   Congregational Nurse Program Note  Date of Encounter: 06/22/2022  Past Medical History: Past Medical History:  Diagnosis Date   Breast cancer ([place])    Cancer ([place])    right breast cancer   Diabetes mellitus without complication ([place])    Family history of breast cancer    Hypertension    Personal history of radiation therapy    Sleep apnea    does not use CPAP    Encounter Details:  CCNP Questionnaire completed. Patient expressed some swelling in her lower extremities. Patient states she is not sure what causes the swelling. Education was given how excessive fluid build up in the body can cause edema. Nurse educated to patient edema could have several contributing factors. Nurse asked patient about her sodium intake and how it can contribute to edema. Nurse educated patient that large amounts of sodium can cause the body to retain water leading to edema. Patient also had concerns about one of her medications causes some urinary concerns. Nurse helped patient to think of some questions to talk with her physician at next appointment on August 01, 2022. Will follow up with patient over the next couple of weeks. Spiritual care given.

## 2022-10-05 DIAGNOSIS — Z139 Encounter for screening, unspecified: Secondary | ICD-10-CM

## 2022-10-05 LAB — GLUCOSE, POCT (MANUAL RESULT ENTRY): POC Glucose: 172 mg/dl — AB (ref 70–99)

## 2022-10-11 ENCOUNTER — Telehealth: Payer: Self-pay | Admitting: Family Medicine

## 2022-10-11 NOTE — Telephone Encounter (Signed)
Caller name: Susan Miles  On DPR?: Yes  Call back number: [phone] (mobile)  Provider they see: [name], MD  Reason for call: Pt called stating that she has notice a increase in her sugar level with her new medications Dapagliflozin 5 mg and Januvia 50 mg. Scheduled pt to see Dr.greene tomorrow. Pt states that her sugar level was 201 this morning. PT also stating normal sugar level is between 104-115. Pt states she has documents that shows her sugar levels from October 3rd until now. Pt will bring all documents tomorrow for appt.

## 2022-10-12 ENCOUNTER — Encounter: Payer: Self-pay | Admitting: Family Medicine

## 2022-10-12 ENCOUNTER — Ambulatory Visit (INDEPENDENT_AMBULATORY_CARE_PROVIDER_SITE_OTHER): Payer: Medicare Other | Admitting: Family Medicine

## 2022-10-12 VITALS — BP 128/70 | HR 67 | Temp 97.8°F | Ht 64.0 in | Wt 192.4 lb

## 2022-10-12 DIAGNOSIS — R809 Proteinuria, unspecified: Secondary | ICD-10-CM | POA: Diagnosis not present

## 2022-10-12 DIAGNOSIS — E1129 Type 2 diabetes mellitus with other diabetic kidney complication: Secondary | ICD-10-CM

## 2022-10-12 NOTE — Patient Instructions (Addendum)
Try doubling the januvia to total '100mg'$  per day. Keep appt in few weeks and we can adjust further if needed.  Return to the clinic or go to the nearest emergency room if any of your symptoms worsen or new symptoms occur.

## 2022-10-12 NOTE — Progress Notes (Unsigned)
Subjective:  Patient ID: Susan Miles, female    DOB: January 25, 1946  Age: 76 y.o. MRN: [id number]  CC:  Chief Complaint  Patient presents with   Diabetes    Pt states her sugar was 179 this morning and it normally runs from 90-115     HPI [name] presents for   Diabetes: Higher readings increased past week. Fasting 164-201 past week. Prior range in October 107-181.  No missed meds, no recent illness, no diet changes.  Feels ok. No n/v/abd pain or vision changes. Some increased water, urination - intentional after discussion with renal.  Metformin 1073m BID Januvia 5367m farxiga 67m767m Egfr 54 on 09/09/22    Lab Results  Component Value Date   HGBA1C 7.4 (A) 08/01/2022   HGBA1C 7.2 (H) 04/07/2022   HGBA1C 8.0 (H) 12/13/2021   Lab Results  Component Value Date   MICROALBUR 7.0 (H) 04/07/2022   LDLCALC 76 04/07/2022   CREATININE 0.95 04/07/2022    History Patient Active Problem List   Diagnosis Date Noted   Cellulitis and abscess of trunk 07/29/2022   Family history of breast cancer    Ductal carcinoma in situ (DCIS) of right breast 07/04/2019   Hyperlipidemia 12/21/2017   Breast asymmetry 05/01/2015   Cataract, nuclear 05/24/2012   Benign hypertension 02/10/2012   Type 2 diabetes mellitus without complications ([id number] Medical History:  Diagnosis Date   [place]ast cancer ([place]  Cancer [place]  right breast cancer   Di[place]  Family history of breast cancer    Hypertension    Personal history of radiation therapy    Sleep apnea    does not use CPAP   Past Surgical History:  Procedure Laterality Date   ABDOMINAL HYSTERECTOMY     BREAST LUMPECTOMY     BREAST LUMPECTOMY WITH RADIOACTIVE SEED LOCALIZATION Right 08/09/2019   Procedure: RIGHT BREAST LUMPECTOMY X 2 WITH RADIOACTIVE SEED LOCALIZATION (3 SEEDS);  Surgeon: NewAlphonsa OverallD;  [facility];   Laterality: Right;   Allergies  Allergen Reactions   Ace Inhibitors Cough   Prior to Admission medications   Medication Sig Start Date End Date Taking? Authorizing Provider  ACCU-CHEK AVIVA PLUS test strip USE TO TEST BLOOD SUGAR ONCE DAILY. DX E11.9 05/25/22  Yes GreWendie AgresteD  acetaminophen (TYLENOL) 500 MG tablet Take 500 mg by mouth every 6 (six) hours as needed for mild pain.   Yes [provider]  amLODipine (NORVASC) 10 MG tablet Take 1 tablet (10 mg total) by mouth daily. 08/01/22  Yes GreWendie AgresteD  aspirin 81 MG tablet Take 81 mg by mouth daily.   Yes [provider]  atorvastatin (LIPITOR) 10 MG tablet Take 1 tablet (10 mg total) by mouth daily. 09/07/22  Yes GreWendie AgresteD  Blood Glucose Monitoring Suppl (LDR BLOOD GLUCOSE TRUETEST) w/Device KIT 1 Device by Does not apply route daily. Test blood sugar once daily. Dx: E11.9 05/13/20  Yes GreWendie AgresteD  cloNIDine (CATAPRES) 0.2 MG tablet TAKE 1 TABLET BY MOUTH 2 TIMES DAILY. 05/05/22  Yes GreWendie AgresteD  dapagliflozin propanediol (FARXIGA) 5 MG TABS tablet Take 1 tablet (5 mg total) by mouth daily before breakfast. 08/01/22  Yes GreWendie AgresteD  fexofenadine (ALLEGRA) 180 MG tablet Take 1 tablet (180 mg total) by mouth daily. 08/01/22  Yes GreCarlota Raspberry  Ranell Patrick, MD  fluticasone (FLONASE) 50 MCG/ACT nasal spray Place 1-2 sprays into both nostrils daily. 08/01/22  Yes [name], MD  JANUVIA 50 MG tablet TAKE 1 TABLET BY MOUTH EVERY DAY 05/25/22  Yes [name], MD  Lancets MISC Test blood sugar once daily. Dx: E11.9 01/24/18  Yes [name], MD  losartan (COZAAR) 100 MG tablet Take 1 tablet (100 mg total) by mouth daily. 08/01/22  Yes [name], MD  metFORMIN (GLUCOPHAGE) 1000 MG tablet Take 1 tablet (1,000 mg total) by mouth 2 (two) times daily with a meal. 08/01/22  Yes [name], MD  metoprolol (TOPROL-XL) 200 MG 24 hr tablet Take 1 tablet (200 mg  total) by mouth daily. 08/01/22  Yes [name], MD  promethazine-dextromethorphan (PROMETHAZINE-DM) 6.25-15 MG/5ML syrup Take 2.5-5 mLs by mouth at bedtime as needed for cough. 12/03/21  Yes [name], MD  tamoxifen (NOLVADEX) 20 MG tablet Take 1 tablet (20 mg total) by mouth daily. 07/06/22  Yes Nicholas Lose, MD  atorvastatin (LIPITOR) 20 MG tablet TAKE 1 TABLET BY MOUTH DAILY AT 6PM Patient not taking: Reported on 10/12/2022 09/07/22   [name], MD  fluconazole (DIFLUCAN) 150 MG tablet Take 1 tablet (150 mg total) by mouth daily. Patient not taking: Reported on 08/01/2022 09/09/21   [name], MD  glipiZIDE (GLUCOTROL) 10 MG tablet TAKE 2 TABLETS BY MOUTH TWICE A DAY BEFORE MEALS Patient not taking: Reported on 10/12/2022 09/07/22   [name], MD  glipiZIDE (GLUCOTROL) 10 MG tablet Take 2 tablets (20 mg total) by mouth 2 (two) times daily before a meal. Patient not taking: Reported on 10/12/2022 09/07/22   [name], MD   Social History   Socioeconomic History   Marital status: Married    Spouse name: Not on file   Number of children: Not on file   Years of education: Not on file   Highest education level: Not on file  Occupational History   Not on file  Tobacco Use   Smoking status: Never   Smokeless tobacco: Never  Substance and Sexual Activity   Alcohol use: No   Drug use: No   Sexual activity: Not on file  Other Topics Concern   Not on file  Social History Narrative   Not on file   Social Determinants of Health   Financial Resource Strain: Medium Risk (05/19/2022)   Overall Financial Resource Strain (CARDIA)    Difficulty of Paying Living Expenses: Somewhat hard  Food Insecurity: No Food Insecurity (05/19/2022)   Hunger Vital Sign    Worried About Running Out of Food in the Last Year: Never true    Ran Out of Food in the Last Year: Never true  Transportation Needs: No Transportation Needs (05/19/2022)   PRAPARE - Armed forces logistics/support/administrative officer (Medical): No    Lack of Transportation (Non-Medical): No  Physical Activity: Insufficiently Active (05/19/2022)   Exercise Vital Sign    Days of Exercise per Week: 3 days    Minutes of Exercise per Session: 30 min  Stress: No Stress Concern Present (05/19/2022)   [place]    Feeling of Stress : Only a little  Social Connections: Moderately Integrated (05/19/2022)   Social Connection and Isolation Panel [NHANES]    Frequency of Communication with Friends and Family: Three times a week    Frequency of Social Gatherings with Friends  and Family: Three times a week    Attends Religious Services: More than 4 times per year    Active Member of Clubs or Organizations: No    Attends Archivist Meetings: Never    Marital Status: Married  Human resources officer Violence: Not At Risk (05/19/2022)   Humiliation, Afraid, Rape, and Kick questionnaire    Fear of Current or Ex-Partner: No    Emotionally Abused: No    Physically Abused: No    Sexually Abused: No    Review of Systems Per HPI.   Objective:   Vitals:   10/12/22 1153  BP: 128/70  Pulse: 67  Temp: 97.8 F (36.6 C)  SpO2: 100%  Weight: 192 lb 6.4 oz (87.3 kg)  Height: 5' 4" (1.626 m)     Physical Exam Vitals reviewed.  Constitutional:      Appearance: Normal appearance. She is well-developed.  HENT:     Head: Normocephalic and atraumatic.  Eyes:     Conjunctiva/sclera: Conjunctivae normal.     Pupils: Pupils are equal, round, and reactive to light.  Neck:     Vascular: No carotid bruit.  Cardiovascular:     Rate and Rhythm: Normal rate and regular rhythm.     Heart sounds: Normal heart sounds.  Pulmonary:     Effort: Pulmonary effort is normal.     Breath sounds: Normal breath sounds.  Abdominal:     Palpations: Abdomen is soft. There is no pulsatile mass.     Tenderness: There is no abdominal tenderness.   Musculoskeletal:     Right lower leg: No edema.     Left lower leg: No edema.  Skin:    General: Skin is warm and dry.  Neurological:     Mental Status: She is alert and oriented to person, place, and time.  Psychiatric:        Mood and Affect: Mood normal.        Behavior: Behavior normal.        Assessment & Plan:  Cniyah Sproull is a 76 y.o. female . Type 2 diabetes mellitus with microalbuminuria, without long-term current use of insulin (HCC) Elevated readings recently without recent infection or known cause.  For now we will increase Januvia to 100 mg twice daily.  Continue other medications same dose.  Option to increase Wilder Glade if needed as well.  Start with change to Januvia first.  If new dose tolerated, can adjust prescription.  Has follow-up next few weeks.  RTC precautions.  No orders of the defined types were placed in this encounter.  Patient Instructions  Try doubling the januvia to total 128m per day. Keep appt in few weeks and we can adjust further if needed.  Return to the clinic or go to the nearest emergency room if any of your symptoms worsen or new symptoms occur.     Signed,   JMerri Ray MD [facility] 10/13/22 5:55 PM

## 2022-10-13 ENCOUNTER — Encounter: Payer: Self-pay | Admitting: Family Medicine

## 2022-11-02 ENCOUNTER — Encounter: Payer: Self-pay | Admitting: Family Medicine

## 2022-11-02 ENCOUNTER — Other Ambulatory Visit: Payer: Self-pay

## 2022-11-02 ENCOUNTER — Ambulatory Visit (INDEPENDENT_AMBULATORY_CARE_PROVIDER_SITE_OTHER): Payer: Medicare Other | Admitting: Family Medicine

## 2022-11-02 VITALS — BP 138/70 | HR 67 | Temp 97.7°F | Ht 64.0 in | Wt 193.6 lb

## 2022-11-02 DIAGNOSIS — R809 Proteinuria, unspecified: Secondary | ICD-10-CM | POA: Diagnosis not present

## 2022-11-02 DIAGNOSIS — E782 Mixed hyperlipidemia: Secondary | ICD-10-CM

## 2022-11-02 DIAGNOSIS — I10 Essential (primary) hypertension: Secondary | ICD-10-CM

## 2022-11-02 DIAGNOSIS — J309 Allergic rhinitis, unspecified: Secondary | ICD-10-CM | POA: Diagnosis not present

## 2022-11-02 DIAGNOSIS — E119 Type 2 diabetes mellitus without complications: Secondary | ICD-10-CM | POA: Diagnosis not present

## 2022-11-02 DIAGNOSIS — E1129 Type 2 diabetes mellitus with other diabetic kidney complication: Secondary | ICD-10-CM | POA: Diagnosis not present

## 2022-11-02 DIAGNOSIS — E785 Hyperlipidemia, unspecified: Secondary | ICD-10-CM

## 2022-11-02 DIAGNOSIS — Z1211 Encounter for screening for malignant neoplasm of colon: Secondary | ICD-10-CM | POA: Diagnosis not present

## 2022-11-02 LAB — COMPREHENSIVE METABOLIC PANEL
ALT: 17 U/L (ref 0–35)
AST: 19 U/L (ref 0–37)
Albumin: 3.9 g/dL (ref 3.5–5.2)
Alkaline Phosphatase: 46 U/L (ref 39–117)
BUN: 15 mg/dL (ref 6–23)
CO2: 26 mEq/L (ref 19–32)
Calcium: 10.5 mg/dL (ref 8.4–10.5)
Chloride: 105 mEq/L (ref 96–112)
Creatinine, Ser: 1.04 mg/dL (ref 0.40–1.20)
GFR: 52.43 mL/min — ABNORMAL LOW (ref >60.00)
Glucose, Bld: 167 mg/dL — ABNORMAL HIGH (ref 70–99)
Potassium: 4 mEq/L (ref 3.5–5.1)
Sodium: 138 mEq/L (ref 135–145)
Total Bilirubin: 0.4 mg/dL (ref 0.2–1.2)
Total Protein: 7.3 g/dL (ref 6.0–8.3)

## 2022-11-02 LAB — LIPID PANEL
Cholesterol: 135 mg/dL (ref 0–200)
HDL: 41.9 mg/dL (ref >39.00)
LDL Cholesterol: 71 mg/dL (ref 0–99)
NonHDL: 92.67
Total CHOL/HDL Ratio: 3
Triglycerides: 109 mg/dL (ref 0.0–149.0)
VLDL: 21.8 mg/dL (ref 0.0–40.0)

## 2022-11-02 LAB — HEMOGLOBIN A1C: Hgb A1c MFr Bld: 8.3 % — ABNORMAL HIGH (ref 4.6–6.5)

## 2022-11-02 MED ORDER — ATORVASTATIN CALCIUM 20 MG PO TABS: ORAL_TABLET | ORAL | 1 refills | Status: DC

## 2022-11-02 MED ORDER — LOSARTAN POTASSIUM 100 MG PO TABS: 100.0000 mg | ORAL_TABLET | Freq: Every day | ORAL | 1 refills | Status: DC

## 2022-11-02 MED ORDER — AMLODIPINE BESYLATE 10 MG PO TABS: 10.0000 mg | ORAL_TABLET | Freq: Every day | ORAL | 1 refills | Status: DC

## 2022-11-02 MED ORDER — FLUTICASONE PROPIONATE 50 MCG/ACT NA SUSP: 1.0000 | Freq: Every day | NASAL | 6 refills | Status: DC

## 2022-11-02 MED ORDER — METFORMIN HCL 1000 MG PO TABS: 1000.0000 mg | ORAL_TABLET | Freq: Two times a day (BID) | ORAL | 1 refills | Status: DC

## 2022-11-02 MED ORDER — METOPROLOL SUCCINATE ER 200 MG PO TB24: 200.0000 mg | ORAL_TABLET | Freq: Every day | ORAL | 1 refills | Status: DC

## 2022-11-02 MED ORDER — CLONIDINE HCL 0.2 MG PO TABS: 0.2000 mg | ORAL_TABLET | Freq: Two times a day (BID) | ORAL | 1 refills | Status: DC

## 2022-11-02 MED ORDER — FEXOFENADINE HCL 180 MG PO TABS: 180.0000 mg | ORAL_TABLET | Freq: Every day | ORAL | 3 refills | Status: DC

## 2022-11-02 NOTE — Progress Notes (Signed)
Subjective:  Patient ID: Susan Miles, female    DOB: 1946-06-09  Age: 76 y.o. MRN: [id number]  CC:  Chief Complaint  Patient presents with   Diabetes    Pt states she is about to run out of [place]  and she gets it from this pharmacy and she needs to sign back up by dec 15 for next year    Hypertension    HPI Susan Miles presents for   Diabetes: Complicated by microalbuminuria, CKD, and hyperglycemia.  Has been followed by pharmacist as well.  Medication assistance for Susan Miles.  Also treated with metformin 1000 mg twice daily, glipizide previously, discontinued in October.  She is on statin with Lipitor, losartan for ARB. Office visit November 8 for high readings -164-201.  Denied missed meds, recent illness or diet changes.  Had been on Januvia 50 mg, advised to increase to 100 mg Total per day.  Typo on previous assessment and plan, should be Januvia 100 mg daily as directed on patient instructions. Still some elevated readings on higher dose januvia. No new side effects.  Fasting 153-194. 253 after eating pie Postprandial about 190.  No n/v/abd pain/vision changes.   Microalbumin: 7.0 on 04/07/2022 Optho, foot exam, pneumovax:   Lab Results  Component Value Date   HGBA1C 7.4 (A) 08/01/2022   HGBA1C 7.2 (H) 04/07/2022   HGBA1C 8.0 (H) 12/13/2021   Lab Results  Component Value Date   MICROALBUR 7.0 (H) 04/07/2022   LDLCALC 76 04/07/2022   CREATININE 0.95 04/07/2022   Hypertension: Hypertension discussed at her August visit, stable at that time with amlodipine, losartan, Toprol, clonidine.  History of chronic peripheral edema versus lymphedema treated with sodium avoidance. Home readings: 120/80. Doing well. No new side effects. Swelling stable.  BP Readings from Last 3 Encounters:  11/02/22 138/70  10/12/22 128/70  08/01/22 138/78   Lab Results  Component Value Date   CREATININE 0.95 04/07/2022   Hyperlipidemia: Lipitor, increased to 20 mg  daily in October. Doing ok on current dose, no new side effects or myalgias.  Lab Results  Component Value Date   CHOL 132 04/07/2022   HDL 35.70 (L) 04/07/2022   LDLCALC 76 04/07/2022   TRIG 102.0 04/07/2022   CHOLHDL 4 04/07/2022   Lab Results  Component Value Date   ALT 20 04/07/2022   AST 21 04/07/2022   ALKPHOS 52 04/07/2022   BILITOT 0.4 04/07/2022   HM: plans on 2nd shingrix at pharmacy. Covid booster recommended.   History Patient Active Problem List   Diagnosis Date Noted   Cellulitis and abscess of trunk 07/29/2022   Family history of breast cancer    Ductal carcinoma in situ (DCIS) of right breast 07/04/2019   Hyperlipidemia 12/21/2017   Breast asymmetry 05/01/2015   Cataract, nuclear 05/24/2012   Benign hypertension 02/10/2012   Type 2 diabetes mellitus without complications (HCC) 02/10/2012   Past Medical History:  Diagnosis Date   Breast cancer (HCC)    Cancer (HCC)    right breast cancer   Diabetes mellitus without complication (HCC)    Family history of breast cancer    Hypertension    Personal history of radiation therapy    Sleep apnea    does not use CPAP   Past Surgical History:  Procedure Laterality Date   ABDOMINAL HYSTERECTOMY     BREAST LUMPECTOMY     BREAST LUMPECTOMY WITH RADIOACTIVE SEED LOCALIZATION Right 08/09/2019   Procedure: RIGHT BREAST LUMPECTOMY X 2  WITH RADIOACTIVE SEED LOCALIZATION (3 SEEDS);  Surgeon: Ovidio Kin, MD;  Location: [facility];  Service: General;  Laterality: Right;   Allergies  Allergen Reactions   Ace Inhibitors Cough   Prior to Admission medications   Medication Sig Start Date End Date Taking? Authorizing Provider  ACCU-CHEK AVIVA PLUS test strip USE TO TEST BLOOD SUGAR ONCE DAILY. DX E11.9 05/25/22  Yes Shade Flood, MD  acetaminophen (TYLENOL) 500 MG tablet Take 500 mg by mouth every 6 (six) hours as needed for mild pain.   Yes [provider]  amLODipine (NORVASC) 10 MG tablet  Take 1 tablet (10 mg total) by mouth daily. 08/01/22  Yes Shade Flood, MD  aspirin 81 MG tablet Take 81 mg by mouth daily.   Yes [provider]  atorvastatin (LIPITOR) 10 MG tablet Take 1 tablet (10 mg total) by mouth daily. 09/07/22  Yes Shade Flood, MD  Blood Glucose Monitoring Suppl (LDR BLOOD GLUCOSE TRUETEST) w/Device KIT 1 Device by Does not apply route daily. Test blood sugar once daily. Dx: E11.9 05/13/20  Yes Shade Flood, MD  cloNIDine (CATAPRES) 0.2 MG tablet TAKE 1 TABLET BY MOUTH 2 TIMES DAILY. 05/05/22  Yes Shade Flood, MD  dapagliflozin propanediol (FARXIGA) 5 MG TABS tablet Take 1 tablet (5 mg total) by mouth daily before breakfast. 08/01/22  Yes Shade Flood, MD  fexofenadine (ALLEGRA) 180 MG tablet Take 1 tablet (180 mg total) by mouth daily. 08/01/22  Yes Shade Flood, MD  FLUAD QUADRIVALENT 0.5 ML injection  08/12/22  Yes [provider]  fluticasone (FLONASE) 50 MCG/ACT nasal spray Place 1-2 sprays into both nostrils daily. 08/01/22  Yes Shade Flood, MD  Lancets MISC Test blood sugar once daily. Dx: E11.9 01/24/18  Yes Shade Flood, MD  losartan (COZAAR) 100 MG tablet Take 1 tablet (100 mg total) by mouth daily. 08/01/22  Yes Shade Flood, MD  metFORMIN (GLUCOPHAGE) 1000 MG tablet Take 1 tablet (1,000 mg total) by mouth 2 (two) times daily with a meal. 08/01/22  Yes Shade Flood, MD  metoprolol (TOPROL-XL) 200 MG 24 hr tablet Take 1 tablet (200 mg total) by mouth daily. 08/01/22  Yes Shade Flood, MD  promethazine-dextromethorphan (PROMETHAZINE-DM) 6.25-15 MG/5ML syrup Take 2.5-5 mLs by mouth at bedtime as needed for cough. 12/03/21  Yes Shade Flood, MD  sitaGLIPtin (JANUVIA) 100 MG tablet Take 100 mg by mouth daily.   Yes [provider]  tamoxifen (NOLVADEX) 20 MG tablet Take 1 tablet (20 mg total) by mouth daily. 07/06/22  Yes Serena Croissant, MD  atorvastatin (LIPITOR) 20 MG tablet TAKE 1 TABLET BY  MOUTH DAILY AT 6PM Patient not taking: Reported on 10/12/2022 09/07/22   Shade Flood, MD  fluconazole (DIFLUCAN) 150 MG tablet Take 1 tablet (150 mg total) by mouth daily. Patient not taking: Reported on 08/01/2022 09/09/21   Shade Flood, MD  glipiZIDE (GLUCOTROL) 10 MG tablet TAKE 2 TABLETS BY MOUTH TWICE A DAY BEFORE MEALS Patient not taking: Reported on 10/12/2022 09/07/22   Shade Flood, MD  glipiZIDE (GLUCOTROL) 10 MG tablet Take 2 tablets (20 mg total) by mouth 2 (two) times daily before a meal. Patient not taking: Reported on 10/12/2022 09/07/22   Shade Flood, MD   Social History   Socioeconomic History   Marital status: Married    Spouse name: Not on file   Number of children: Not on file  Years of education: Not on file   Highest education level: Not on file  Occupational History   Not on file  Tobacco Use   Smoking status: Never   Smokeless tobacco: Never  Substance and Sexual Activity   Alcohol use: No   Drug use: No   Sexual activity: Not on file  Other Topics Concern   Not on file  Social History Narrative   Not on file   Social Determinants of Health   Financial Resource Strain: Medium Risk (05/19/2022)   Overall Financial Resource Strain (CARDIA)    Difficulty of Paying Living Expenses: Somewhat hard  Food Insecurity: No Food Insecurity (05/19/2022)   Hunger Vital Sign    Worried About Running Out of Food in the Last Year: Never true    Ran Out of Food in the Last Year: Never true  Transportation Needs: No Transportation Needs (05/19/2022)   PRAPARE - Administrator, Civil Service (Medical): No    Lack of Transportation (Non-Medical): No  Physical Activity: Insufficiently Active (05/19/2022)   Exercise Vital Sign    Days of Exercise per Week: 3 days    Minutes of Exercise per Session: 30 min  Stress: No Stress Concern Present (05/19/2022)   [employer] of Occupational Health - Occupational Stress Questionnaire    Feeling of  Stress : Only a little  Social Connections: Moderately Integrated (05/19/2022)   Social Connection and Isolation Panel [NHANES]    Frequency of Communication with Friends and Family: Three times a week    Frequency of Social Gatherings with Friends and Family: Three times a week    Attends Religious Services: More than 4 times per year    Active Member of Clubs or Organizations: No    Attends Banker Meetings: Never    Marital Status: Married  Catering manager Violence: Not At Risk (05/19/2022)   Humiliation, Afraid, Rape, and Kick questionnaire    Fear of Current or Ex-Partner: No    Emotionally Abused: No    Physically Abused: No    Sexually Abused: No    Review of Systems  Constitutional:  Negative for fatigue and unexpected weight change.  Respiratory:  Negative for chest tightness and shortness of breath.   Cardiovascular:  Negative for chest pain, palpitations and leg swelling.  Gastrointestinal:  Negative for abdominal pain and blood in stool.  Neurological:  Negative for dizziness, syncope, light-headedness and headaches.     Objective:   Vitals:   11/02/22 1041  BP: 138/70  Pulse: 67  Temp: 97.7 F (36.5 C)  SpO2: 98%  Weight: 193 lb 9.6 oz (87.8 kg)  Height: 5\' 4"  (1.626 m)     Physical Exam Vitals reviewed.  Constitutional:      Appearance: Normal appearance. She is well-developed.  HENT:     Head: Normocephalic and atraumatic.  Eyes:     Conjunctiva/sclera: Conjunctivae normal.     Pupils: Pupils are equal, round, and reactive to light.  Neck:     Vascular: No carotid bruit.  Cardiovascular:     Rate and Rhythm: Normal rate and regular rhythm.     Heart sounds: Normal heart sounds.  Pulmonary:     Effort: Pulmonary effort is normal.     Breath sounds: Normal breath sounds.  Abdominal:     Palpations: Abdomen is soft. There is no pulsatile mass.     Tenderness: There is no abdominal tenderness.  Musculoskeletal:     Right lower leg:  No  edema.     Left lower leg: No edema.  Skin:    General: Skin is warm and dry.  Neurological:     Mental Status: She is alert and oriented to person, place, and time.  Psychiatric:        Mood and Affect: Mood normal.        Behavior: Behavior normal.        Assessment & Plan:  Susan Miles is a 76 y.o. female . Type 2 diabetes mellitus without complication, without long-term current use of insulin (HCC) - Plan: Comprehensive metabolic panel, Hemoglobin A1c Type 2 diabetes mellitus with microalbuminuria, without long-term current use of insulin (HCC) - Plan: metFORMIN (GLUCOPHAGE) 1000 MG tablet  -Elevated readings as above.  Off glipizide.  We will continue to hold on restarting glipizide at this time, try higher dose of Farxiga, continue Januvia metformin same doses.  Pharmacist contacted, will have paperwork for medication assistance completed.  Screen for colon cancer - Plan: Ambulatory referral to Gastroenterology  Essential hypertension - Plan: Comprehensive metabolic panel, cloNIDine (CATAPRES) 0.2 MG tablet, losartan (COZAAR) 100 MG tablet, metoprolol (TOPROL-XL) 200 MG 24 hr tablet, DISCONTINUED: amLODipine (NORVASC) 10 MG tablet, DISCONTINUED: amLODipine (NORVASC) 10 MG tablet, DISCONTINUED: amLODipine (NORVASC) 10 MG tablet  -Stable, continue same regimen, check labs.  Hyperlipidemia, unspecified hyperlipidemia type - Plan: Comprehensive metabolic panel, Lipid panel Mixed hyperlipidemia - Plan: atorvastatin (LIPITOR) 20 MG tablet  -Tolerating current dose of Lipitor, continue same.  Allergic rhinitis, unspecified seasonality, unspecified trigger - Plan: fluticasone (FLONASE) 50 MCG/ACT nasal spray, fexofenadine (ALLEGRA) 180 MG tablet  -Stable, continue Flonase, Allegra same doses for now.  Recommended COVID booster and second shingles vaccine at her pharmacy.  No orders of the defined types were placed in this encounter.  Patient Instructions  Should see  improved readings on higher dose farxiga, but send me some readings in next few weeks. Continue 100mg  januvia and same dose of metformin and other meds.  Return to the clinic or go to the nearest emergency room if any of your symptoms worsen or new symptoms occur.  I recommend covid booster and 2nd shingles vaccine at your pharmacy.   Take care!     Signed,   Meredith Staggers, MD [facility], [facility] 11/02/22 10:55 AM

## 2022-11-02 NOTE — Progress Notes (Signed)
Resent the Rx to [facility]

## 2022-11-02 NOTE — Patient Instructions (Addendum)
Should see improved readings on higher dose farxiga, but send me some readings in next few weeks. Continue '100mg'$  januvia and same dose of metformin and other meds.  Return to the clinic or go to the nearest emergency room if any of your symptoms worsen or new symptoms occur.  I recommend covid booster and 2nd shingles vaccine at your pharmacy.   Take care!

## 2022-11-03 ENCOUNTER — Encounter: Payer: Self-pay | Admitting: Family Medicine

## 2022-11-03 MED ORDER — DAPAGLIFLOZIN PROPANEDIOL 10 MG PO TABS: 10.0000 mg | ORAL_TABLET | Freq: Every day | ORAL | 5 refills | Status: DC

## 2022-11-06 DIAGNOSIS — Z139 Encounter for screening, unspecified: Secondary | ICD-10-CM

## 2022-11-06 LAB — GLUCOSE, POCT (MANUAL RESULT ENTRY): POC Glucose: 200 mg/dl — AB (ref 70–99)

## 2022-11-07 NOTE — Congregational Nurse Program (Signed)
  Dept: [phone]   Congregational Nurse Program Note  Date of Encounter: 11/06/2022  Past Medical History: Past Medical History:  Diagnosis Date   Breast cancer ([place])    Cancer ([place])    right breast cancer   Diabetes mellitus without complication ([place])    Family history of breast cancer    Hypertension    Personal history of radiation therapy    Sleep apnea    does not use CPAP    Encounter Details:  CNP Questionnaire - 11/06/22 1330       Questionnaire   Ask client: Do you give verbal consent for me to treat you today? Yes    Student Assistance N/A    Location Patient [place]    Visit Setting with Client [place]    Patient Status Unknown    Insurance Medicare    Insurance/Financial Assistance Referral N/A    Medication N/A    Medical Provider Yes    Screening Referrals Made N/A    Medical Referrals Made N/A    Medical Appointment Made N/A    Recently w/o PCP, now 1st time PCP visit completed due to CNs referral or appointment made N/A    Food N/A    Transportation N/A    Housing/Utilities N/A    Interpersonal Safety N/A    Interventions Advocate/Support;Counsel;Spiritual Care    Abnormal to Normal Screening Since Last CN Visit N/A    Screenings CN Performed Weight;Blood Glucose    Sent Client to Lab for: N/A    Did client attend any of the following based off CNs referral or appointments made? N/A    ED Visit Averted N/A    Life-Saving Intervention Made N/A             Today's Vitals   11/06/22 1330  Weight: 192 lb (87.1 kg)   Body mass index is 32.96 kg/m.  Patient discussed with nurse concerns about elevated blood glucose. Pt informed nurse her physician has increased diabetic medication. Patient has continued weight lost. Patient informed nurse she has increased water intake, she now drinks 4 bottles of water. We discussed a goal to increase physical activity in an effort to lower blood glucose, along with proper diet and  water intake. Patient states she will try to exercise three times this week on home exercise equipment. Will continue to monitor patient progress.

## 2022-11-14 ENCOUNTER — Telehealth: Payer: Self-pay | Admitting: Family Medicine

## 2022-11-14 NOTE — Telephone Encounter (Addendum)
Patient dropped off forms in a white envelope for Dr.Greene . Placed in front bin.

## 2022-11-14 NOTE — Congregational Nurse Program (Signed)
  Dept: [phone]   Congregational Nurse Program Note  Date of Encounter: 10/05/2022  Past Medical History: Past Medical History:  Diagnosis Date   Breast cancer ([place])    Cancer ([place])    right breast cancer   Diabetes mellitus without complication ([place])    Family history of breast cancer    Hypertension    Personal history of radiation therapy    Sleep apnea    does not use CPAP    Encounter Details:  CCNP Questionnaire completed.  Today's Vitals   10/05/22 1520  Weight: 195 lb (88.5 kg)   Body mass index is 33.47 kg/m.  Patient is achieving some of her health goals as patient has lost 5 pounds. Patient states she is drinking more water. Patient is no longer taking glipizide per doctor orders. Patient denies swelling to legs and feet at this time. Patient will have a follow up with her primary physician in December 2023. Patient wanted her blood glucose and weight checked for clinic today. Patient state she will follow up with nurse in a couple of weeks.

## 2022-11-15 NOTE — Congregational Nurse Program (Signed)
  Dept: [phone]   Congregational Nurse Program Note  Date of Encounter: 09/04/2022  Past Medical History: Past Medical History:  Diagnosis Date   Breast cancer ([place])    Cancer ([place])    right breast cancer   Diabetes mellitus without complication ([place])    Family history of breast cancer    Hypertension    Personal history of radiation therapy    Sleep apnea    does not use CPAP    Encounter Details:   Today's Vitals   09/04/22 1515  BP: 138/76  Pulse: 76  Resp: 17  Temp: (!) 97.2 F (36.2 C)  TempSrc: Temporal  SpO2: 98%  Weight: 199 lb (90.3 kg)   Body mass index is 34.16 kg/m.  CCNP Questionnaire completed and in patient electronic record. Patient came into clinic to access blood glucose and vitals. Patient states she has increased physical activity and has loss a couple of pounds. Patient states her primary provider is decreasing her glipizide and she will meet with the pharmacy in her providers office to discuss medications. Patient states she may need some assistance with purchasing diabetic medication but the pharmacist at her doctors office is assisting with this circumstance. Will follow up with patient in a couple of weeks.

## 2022-11-15 NOTE — Progress Notes (Signed)
Chronic Care Management Pharmacy Note  11/24/2022 Name:  Susan Miles MRN:  [id number] DOB:  [birth year]  Summary: PharmD FU visit.  Recent increase in A1c and has increased her [place] and Januvia.  New dose of Farxiga shipped out.  Still pending application at [employer], was placed in mail 11/17/22.  Lots of dietary counseling today - patient agreeable to plan and to increase her physical activity at home.  Recommendations/Changes made from today's visit: Implement dietary changes Fu on Januvia rx Recheck A1c at next OV  Plan: CMA to call about glucose in 30 days PharmD FU in 30 days   Subjective: Susan Miles is an 76 y.o. year old female who is a primary patient of Carlota Raspberry, Ranell Patrick, MD.  The CCM team was consulted for assistance with disease management and care coordination needs.    Engaged with patient by telephone for follow up visit in response to provider referral for pharmacy case management and/or care coordination services.   Consent to Services:  The patient was given the following information about Chronic Care Management services today, agreed to services, and gave verbal consent: 1. CCM service includes personalized support from designated clinical staff supervised by the primary care provider, including individualized plan of care and coordination with other care providers 2. 24/7 contact phone numbers for assistance for urgent and routine care needs. 3. Service will only be billed when office clinical staff spend 20 minutes or more in a month to coordinate care. 4. Only one practitioner may furnish and bill the service in a calendar month. 5.The patient may stop CCM services at any time (effective at the end of the month) by phone call to the office staff. 6. The patient will be responsible for cost sharing (co-pay) of up to 20% of the service fee (after annual deductible is met). Patient agreed to services and consent obtained.  Patient Care Team: [name], MD as PCP - General (Family Medicine) Webb Laws, [place] as Referring Physician (Optometry) Nicholas Lose, MD as Consulting Physician (Hematology and Oncology) Kyung Rudd, MD as Consulting Physician (Radiation Oncology) Alphonsa Overall, MD as Consulting Physician (General Surgery) Edythe Clarity, [facility] as Pharmacist (Pharmacist)  Recent office visits:  04/21/22 [name] MD - Family Medicine - Diabetes - Labs were ordered. No medication changes were noted. Follow up in 3 months.    04/07/22 [name]. MD - Family Medicine - Mixed hyperlipidemia - Labs were ordered. EKG performed. Referral to cardiology. Follow up in 2 weeks.    12/13/21 [name], MD - Family Medicine - Cough - AIC ordered. Atorvastatin (LIPITOR) 20 MG tablet and Dapagliflozin propanediol (FARXIGA) 10 MG TABS tablet prescribed. Follow up in 3 months.    12/03/2021 [name], MD - Family Medicine - Cough - CXR ordered. Amoxicillin-clavulanate (AUGMENTIN) 875-125 MG tablet, Benzonatate (TESSALON) 100 MG capsule, and promethazine-dextromethorphan (PROMETHAZINE-DM) 6.25-15 MG/5ML syrup prescribed. Follow up if no improvement.    Recent consult visits:  04/12/22 Phineas Inches MD - Cardiology - Abnormal EKG - EKG performed - Discussed wearing compression socks for edema and wearing CPAP at night to help with fatigue. Follow up as needed.    Hospital visits:  None in previous 6 months   Objective:  Lab Results  Component Value Date   CREATININE 1.04 11/02/2022   BUN 15 11/02/2022   GFR 52.43 (L) 11/02/2022   GFRNONAA 53 (L) 12/15/2020   GFRAA 61 12/15/2020   NA 138 11/02/2022   K 4.0 11/02/2022  CALCIUM 10.5 11/02/2022   CO2 26 11/02/2022   GLUCOSE 167 (H) 11/02/2022    Lab Results  Component Value Date/Time   HGBA1C 8.3 (H) 11/02/2022 11:22 AM   HGBA1C 7.4 (A) 08/01/2022 11:49 AM   HGBA1C 7.2 (H) 04/07/2022 11:56 AM   GFR 52.43 (L) 11/02/2022 11:22 AM   GFR 58.68 (L) 04/07/2022 11:56  AM   MICROALBUR 7.0 (H) 04/07/2022 11:56 AM   MICROALBUR 7.5 03/23/2016 10:16 AM    Last diabetic Eye exam:  Lab Results  Component Value Date/Time   HMDIABEYEEXA No Retinopathy 01/03/2020 12:00 AM    Last diabetic Foot exam: No results found for: "HMDIABFOOTEX"   Lab Results  Component Value Date   CHOL 135 11/02/2022   HDL 41.90 11/02/2022   LDLCALC 71 11/02/2022   TRIG 109.0 11/02/2022   CHOLHDL 3 11/02/2022       Latest Ref Rng & Units 11/02/2022   11:22 AM 04/07/2022   11:56 AM 09/09/2021    2:19 PM  Hepatic Function  Total Protein 6.0 - 8.3 g/dL 7.3  7.1  7.2   Albumin 3.5 - 5.2 g/dL 3.9  3.9  3.9   AST 0 - 37 U/L _0 ALT 0 - 35 U/L _1 Alk Phosphatase 39 - 117 U/L 46  52  49   Total Bilirubin 0.2 - 1.2 mg/dL 0.4  0.4  0.4     Lab Results  Component Value Date/Time   TSH 2.15 04/07/2022 11:56 AM   TSH 3.310 08/26/2020 11:43 AM       Latest Ref Rng & Units 04/07/2022   11:56 AM 08/26/2020   11:43 AM 07/10/2019    7:47 AM  CBC  WBC 4.0 - 10.5 K/uL 4.4  5.4  5.4   Hemoglobin 12.0 - 15.0 g/dL 12.9  13.1  13.1   Hematocrit 36.0 - 46.0 % 40.1  39.9  41.5   Platelets 150.0 - 400.0 K/uL 248.0  263  329     No results found for: "VD25OH"  Clinical ASCVD: No  The 10-year ASCVD risk score (Arnett DK, et al., 2019) is: 24.2%   Values used to calculate the score:     Age: 76 years     Sex: Female     Is Non-Hispanic African American: Yes     Diabetic: Yes     Tobacco smoker: No     Systolic Blood Pressure: 599 mmHg     Is BP treated: Yes     HDL Cholesterol: 41.9 mg/dL     Total Cholesterol: 135 mg/dL       11/02/2022   10:40 AM 10/12/2022   11:52 AM 08/01/2022   10:40 AM  Depression screen PHQ 2/9  Decreased Interest 0 0 0  Down, Depressed, Hopeless 0 0 0  PHQ - 2 Score 0 0 0  Altered sleeping 0 1 0  Tired, decreased energy 2 1 0  Change in appetite 0 0 0  Feeling bad or failure about yourself  0 0 0  Trouble concentrating 1 0 0   Moving slowly or fidgety/restless 0 0 0  Suicidal thoughts 0 0 0  PHQ-9 Score 3 2 0      Social History   Tobacco Use  Smoking Status Never  Smokeless Tobacco Never   BP Readings from Last 3 Encounters:  11/02/22 138/70  10/12/22 128/70  09/04/22 138/76   Pulse Readings from Last 3  Encounters:  11/02/22 67  10/12/22 67  09/04/22 76   Wt Readings from Last 3 Encounters:  11/06/22 192 lb (87.1 kg)  11/02/22 193 lb 9.6 oz (87.8 kg)  10/12/22 192 lb 6.4 oz (87.3 kg)   BMI Readings from Last 3 Encounters:  11/06/22 32.96 kg/m  11/02/22 33.23 kg/m  10/12/22 33.03 kg/m    Assessment/Interventions: Review of patient past medical history, allergies, medications, health status, including review of consultants reports, laboratory and other test data, was performed as part of comprehensive evaluation and provision of chronic care management services.   SDOH:  (Social Determinants of Health) assessments and interventions performed: No, done this year Financial Resource Strain: Medium Risk (05/19/2022)   Overall Financial Resource Strain (CARDIA)    Difficulty of Paying Living Expenses: Somewhat hard   Food Insecurity: No Food Insecurity (05/19/2022)   Hunger Vital Sign    Worried About Running Out of Food in the Last Year: Never true    Ran Out of Food in the Last Year: Never true    SDOH Interventions    Flowsheet Row Clinical Support from 05/19/2022 in [place] Interventions Intervention Not Indicated  Housing Interventions Intervention Not Indicated  Transportation Interventions Intervention Not Indicated  Financial Strain Interventions [id number] Referral  Physical Activity Interventions Intervention Not Indicated  Social Connections Interventions Intervention Not Indicated      Financial Resource Strain: Medium Risk (05/19/2022)   Overall Financial Resource Strain (CARDIA)     Difficulty of Paying Living Expenses: Somewhat hard   Food Insecurity: No Food Insecurity (05/19/2022)   Hunger Vital Sign    Worried About Running Out of Food in the Last Year: Never true    Ran Out of Food in the Last Year: Never true    [place]: No Food Insecurity (05/19/2022)  Housing: Low Risk  (05/19/2022)  Transportation Needs: No Transportation Needs (05/19/2022)  Alcohol Screen: Low Risk  (05/19/2022)  Depression (PHQ2-9): Low Risk  (11/02/2022)  Financial Resource Strain: Medium Risk (05/19/2022)  Physical Activity: Insufficiently Active (05/19/2022)  Social Connections: Moderately Integrated (05/19/2022)  Stress: No Stress Concern Present (05/19/2022)  Tobacco Use: Low Risk  (11/03/2022)    CCM Care Plan  Allergies  Allergen Reactions   Ace Inhibitors Cough    Medications Reviewed Today     Reviewed by [name], MD (Physician) on 11/03/22 at [facility] List Status: <None>   Medication Order Taking? Sig Documenting Provider Last Dose Status Informant  ACCU-CHEK AVIVA PLUS test strip [id number] Yes USE TO TEST BLOOD SUGAR ONCE DAILY. DX E11.9 [name], MD Taking Active   acetaminophen (TYLENOL) 500 MG tablet [id number] Yes Take 500 mg by mouth every 6 (six) hours as needed for mild pain. [provider] Taking Active Self  amLODipine (NORVASC) 10 MG tablet [id number]  Take 1 tablet (10 mg total) by mouth daily. [name], MD  Active   aspirin 81 MG tablet [id number] Yes Take 81 mg by mouth daily. [provider] Taking Active Self  atorvastatin (LIPITOR) 20 MG tablet [id number]  TAKE 1 TABLET BY MOUTH DAILY AT Osie Cheeks, MD  Active   Blood Glucose Monitoring Suppl (LDR BLOOD GLUCOSE TRUETEST) w/Device KIT [id number] Yes 1 Device by Does not apply route daily. Test blood sugar once daily. Dx: E11.9 [name], MD Taking Active   cloNIDine (CATAPRES) 0.2 MG tablet [id number]  Take  1 tablet (0.2 mg  total) by mouth 2 (two) times daily. [name], MD  Active   dapagliflozin propanediol (FARXIGA) 10 MG TABS tablet [id number] Yes Take 1 tablet (10 mg total) by mouth daily before breakfast. [name], MD  Active   fexofenadine (ALLEGRA) 180 MG tablet [id number]  Take 1 tablet (180 mg total) by mouth daily. [name], MD  Active   FLUAD QUADRIVALENT 0.5 ML injection [id number] Yes  [provider]  Active   fluticasone (FLONASE) 50 MCG/ACT nasal spray [id number]  Place 1-2 sprays into both nostrils daily. [name], MD  Active   Lancets [place] [id number] Yes Test blood sugar once daily. Dx: E11.9 [name], MD Taking Active   losartan (COZAAR) 100 MG tablet [id number]  Take 1 tablet (100 mg total) by mouth daily. [name], MD  Active   metFORMIN (GLUCOPHAGE) 1000 MG tablet [id number]  Take 1 tablet (1,000 mg total) by mouth 2 (two) times daily with a meal. [name], MD  Active   metoprolol (TOPROL-XL) 200 MG 24 hr tablet [id number]  Take 1 tablet (200 mg total) by mouth daily. [name], MD  Active   sitaGLIPtin (JANUVIA) 100 MG tablet [id number] Yes Take 100 mg by mouth daily. [provider] Taking Active   tamoxifen (NOLVADEX) 20 MG tablet [id number] Yes Take 1 tablet (20 mg total) by mouth daily. Nicholas Lose, MD Taking Active             Patient Active Problem List   Diagnosis Date Noted   Cellulitis and abscess of trunk 07/29/2022   Family history of breast cancer    Ductal carcinoma in situ (DCIS) of right breast 07/04/2019   Hyperlipidemia 12/21/2017   Breast asymmetry 05/01/2015   Cataract, nuclear 05/24/2012   Benign hypertension 02/10/2012   Type 2 diabetes mellitus without complications ([place]) 10/93/2355    Immunization History  Administered Date(s) Administered   DTaP 02/03/2007   Fluad Quad(high Dose 65+) 08/12/2022   Influenza Split 09/17/2012   Influenza, High Dose Seasonal PF 09/22/2018,  09/06/2019   Influenza,inj,Quad PF,6+ Mos 09/30/2013, 07/28/2014, 09/21/2015, 09/22/2016, 08/03/2017   Influenza-Unspecified 08/19/2020   PFIZER(Purple Top)SARS-COV-2 Vaccination 01/10/2020, 01/31/2020, 10/03/2020   Pneumococcal Conjugate-13 12/05/2010, 10/27/2014   Pneumococcal Polysaccharide-23 09/21/2015   Zoster, Live 03/19/2014    Conditions to be addressed/monitored:  HTN, DM, HLD, Hx of Breast Cancer  Care Plan : General Pharmacy (Adult)  Updates made by Edythe Clarity, RPH since 11/24/2022 12:00 AM     Problem: HTN, DM, HLD, Hx of Breast Cancer   Priority: High  Onset Date: 06/01/2022     Long-Range Goal: Patient-Specific Goal   Start Date: 06/01/2022  Expected End Date: 12/01/2022  Recent Progress: On track  Priority: High  Note:   Current Barriers:  Unable to achieve control of A1c  Medication cost  Pharmacist Clinical Goal(s):  Patient will verbalize ability to afford treatment regimen achieve improvement in A1c as evidenced by labs through collaboration with PharmD and provider.   Interventions: 1:1 collaboration with [name], MD regarding development and update of comprehensive plan of care as evidenced by provider attestation and co-signature Inter-disciplinary care team collaboration (see longitudinal plan of care) Comprehensive medication review performed; medication list updated in electronic medical record  Hypertension (BP goal <130/80) -Controlled, based on recent office visits and home monitoring -Current treatment: Amlodipine 20m Appropriate, Effective, Safe, Accessible Losartan 1014mAppropriate, Effective, Safe, Accessible Metoprolol XL 20083m  Appropriate, Effective, Safe, Accessible Clonidine 0.105m twice daily Appropriate, Effective, Safe, Accessible -Medications previously tried: valsartan, HCTZ  -Current home readings: 124/80 - controlled normally like this at home -Current exercise habits: has elliptical at home and will do once  or twice per day -Denies hypotensive/hypertensive symptoms -Educated on BP goals and benefits of medications for prevention of heart attack, stroke and kidney damage; Exercise goal of 150 minutes per week; Importance of home blood pressure monitoring; -Counseled to monitor BP at home a few times per week, document, and provide log at future appointments -Counseled on diet and exercise extensively Recommended to continue current medication Amlodipine could be causing some of her feet swelling, it resolves when she elevates.  She denies any SOB or anything associated.  Continue meds for now, FU if swelling gets worse.  Hyperlipidemia: (LDL goal < 70) 11/23/20 -Controlled, LDL 71 at last visit -Current treatment: Atorvastatin 253mAppropriate, Effective, Safe, Accessible -Medications previously tried: pravastatin   -Current exercise habits: see HTN -Educated on Cholesterol goals;  Benefits of statin for ASCVD risk reduction; Importance of limiting foods high in cholesterol; -Recommended to continue current medication Not quite at LDL goal < 70 with DM.  Given age and how borderline it is - no changes at this time. Could consider dose increase of statin if LDL elevates. Current ASCVD risk is 24.2% - high risk so could consider high intensity statin in the future.  Diabetes (A1c goal <7%) 11/23/22 -Uncontrolled, most recent A1c is 8.3% -Current medications: Januvia 10025mppropriate, Effective, Safe, Accessible Metformin 1000m41md Appropriate, Effective, Safe, Accessible Farxiga 10mg56mly Appropriate, Effective, Query Safe,  -Medications previously tried: FarxiIrant)  -Current home glucose readings fasting glucose: 140-170 post prandial glucose: N/A -Denies hypoglycemic/hyperglycemic symptoms -Exercise - elliptical machine (7-15 minutes daily), treadmill about 3x per weeek -Dietary recall: B - oatmeal w/ blueberries (cinnamon sugar/honey), 2 slices of bacon L - apple/orange D -  broiled fish, yams, fried okra Drinks - coffee w/ cream or sugar, water, sweet tea -Educated on A1c and blood sugar goals; -her most recent A1c was elevated, Dr. GreenCarlota Raspberryeased the dose of both her Januvia and FarxiIrane says she has not seen a huge difference in home readings. -Diet recall included a lot of carbohydrate sources - educated on effect on glucose today and created a plan to try to limit servings of these. -Patient also to increase her physical activity at home! -Will have CMA FDelmitat of next year. Check A1c at next OV.   Hx of Breast Cancer (Goal: Remain in remission) -Controlled, not assessed -Current treatment  Tamoxifen 20mg 86mopriate, Effective, Safe, Accessible -Medications previously tried: none noted -Adherent with medication, she has to take for 5 years.  -Recommended to continue current medication Denies any adverse effects  Patient Goals/Self-Care Activities Patient will:  - take medications as prescribed as evidenced by patient report and record review check glucose fasting, document, and provide at future appointments collaborate with provider on medication access solutions  Follow Up Plan: The care management team will reach out to the patient again over the next 180 days.            Medication Assistance: None required.  Patient affirms current coverage meets needs.  Compliance/Adherence/Medication fill history: Care Gaps: None - she reports completed eye exam  Star-Rating Drugs: Losartan (COZAAR) 100 MG tablet - last filled 11/07/22 90 days JANUVIA 100 MG tablet - last filled 11/17/22 90ds Atorvastatin (LIPITOR) 20 MG tablet - last filled 11/02/2389  days Farxiga 46m 11/21/22 90ds  Patient's preferred pharmacy is:  [id number] [place], [place]: [phone]: [address]: [phone]: [phone] pill box? No - own methods Pt endorses 100% compliance  We discussed: Benefits of medication synchronization, packaging and delivery as well as enhanced pharmacist oversight with [employer]. Patient decided to: Continue current medication management strategy  Care Plan and Follow Up Patient Decision:  Patient agrees to Care Plan and Follow-up.  Plan: The care management team will reach out to the patient again over the next 180 days.  ChBeverly MilchPharmD Clinical Pharmacist  [phone]

## 2022-11-16 NOTE — Congregational Nurse Program (Signed)
  Dept: [phone]   Congregational Nurse Program Note  Date of Encounter: 08/10/2022  Past Medical History: Past Medical History:  Diagnosis Date   Breast cancer ([place])    Cancer ([place])    right breast cancer   Diabetes mellitus without complication ([place])    Family history of breast cancer    Hypertension    Personal history of radiation therapy    Sleep apnea    does not use CPAP    Encounter Details:   Today's Vitals   08/10/22 1530  BP: 130/74  Pulse: 70  Resp: 18  Temp: 97.6 F (36.4 C)  TempSrc: Temporal  SpO2: 99%  Weight: 200 lb (90.7 kg)   Body mass index is 34.33 kg/m.  Patient had a appointment with Dr. Carlota Raspberry last week . Pharmacy in physician office working on getting medication for diabetes for patient for reduced price. Patient expressed low energy. Patient state increasing physical activity and increased water intake.

## 2022-11-16 NOTE — Congregational Nurse Program (Signed)
  Dept: [phone]   Congregational Nurse Program Note  Date of Encounter: 07/20/2022  Past Medical History: Past Medical History:  Diagnosis Date   Breast cancer ([place])    Cancer ([place])    right breast cancer   Diabetes mellitus without complication ([place])    Family history of breast cancer    Hypertension    Personal history of radiation therapy    Sleep apnea    does not use CPAP    Encounter Details:   Today's Vitals   07/20/22 1500  BP: 134/77  Pulse: 74  Resp: 18  Temp: 97.9 F (36.6 C)  TempSrc: Temporal  SpO2: 99%  Weight: 200 lb (90.7 kg)  Height: '5\' 4"'$  (1.626 m)   Body mass index is 34.33 kg/m.  CCNP Questionnaire completed and in patient medical record. Patient came to clinic to access vitals and blood glucose. Patient express her primary is going to start changing some of her medications and the concerns about the process. Patient states she has recently seen an increase in blood glucose reading. Spiritual care given. Will follow up with patient in a couple of weeks about patient concerns.

## 2022-11-16 NOTE — Telephone Encounter (Signed)
Received envelope and placed in Dr. Vonna Kotyk folder

## 2022-11-17 ENCOUNTER — Telehealth: Payer: Self-pay

## 2022-11-17 DIAGNOSIS — E1129 Type 2 diabetes mellitus with other diabetic kidney complication: Secondary | ICD-10-CM

## 2022-11-17 MED ORDER — SITAGLIPTIN PHOSPHATE 50 MG PO TABS: 50.0000 mg | ORAL_TABLET | Freq: Every day | ORAL | 0 refills | Status: DC

## 2022-11-17 NOTE — Telephone Encounter (Addendum)
Will forward to pharmacist as he may have an idea of when that med may be sent to her.  It looks like a 30-day supply was sent locally today.

## 2022-11-17 NOTE — Telephone Encounter (Signed)
Given to pharmacist and signed.

## 2022-11-17 NOTE — Telephone Encounter (Signed)
Pt is requesting a refill on januvia short supply to be sent [facility] [place] notes she is waiting on regular fill from mail order

## 2022-11-18 NOTE — Telephone Encounter (Signed)
I just put her completed application that she brought Wednesday in the mail yesterday.  I would think if she gets a 30 day supply locally she will have it through the mail before that runs out.

## 2022-11-23 ENCOUNTER — Ambulatory Visit: Payer: Medicare Other | Admitting: Pharmacist

## 2022-11-23 DIAGNOSIS — E785 Hyperlipidemia, unspecified: Secondary | ICD-10-CM

## 2022-11-23 DIAGNOSIS — E119 Type 2 diabetes mellitus without complications: Secondary | ICD-10-CM

## 2022-11-24 NOTE — Patient Instructions (Addendum)
Visit Information   Goals Addressed             This Visit's Progress    Monitor and Manage My Blood Sugar-Diabetes Type 2   On track    Timeframe:  Long-Range Goal Priority:  High Start Date: 06/01/22                            Expected End Date:  12/01/22                     Follow Up Date 09/01/22    - check blood sugar at prescribed times - check blood sugar before and after exercise - check blood sugar if I feel it is too high or too low    Why is this important?   Checking your blood sugar at home helps to keep it from getting very high or very low.  Writing the results in a diary or log helps the doctor know how to care for you.  Your blood sugar log should have the time, date and the results.  Also, write down the amount of insulin or other medicine that you take.  Other information, like what you ate, exercise done and how you were feeling, will also be helpful.     Notes:        Patient Care Plan: General Pharmacy (Adult)     Problem Identified: HTN, DM, HLD, Hx of Breast Cancer   Priority: High  Onset Date: 06/01/2022     Long-Range Goal: Patient-Specific Goal   Start Date: 06/01/2022  Expected End Date: 12/01/2022  Recent Progress: On track  Priority: High  Note:   Current Barriers:  Unable to achieve control of A1c  Medication cost  Pharmacist Clinical Goal(s):  Patient will verbalize ability to afford treatment regimen achieve improvement in A1c as evidenced by labs through collaboration with PharmD and provider.   Interventions: 1:1 collaboration with [name], MD regarding development and update of comprehensive plan of care as evidenced by provider attestation and co-signature Inter-disciplinary care team collaboration (see longitudinal plan of care) Comprehensive medication review performed; medication list updated in electronic medical record  Hypertension (BP goal <130/80) -Controlled, based on recent office visits and home  monitoring -Current treatment: Amlodipine '10mg'$  Appropriate, Effective, Safe, Accessible Losartan '100mg'$  Appropriate, Effective, Safe, Accessible Metoprolol XL '200mg'$  Appropriate, Effective, Safe, Accessible Clonidine 0.'2mg'$  twice daily Appropriate, Effective, Safe, Accessible -Medications previously tried: valsartan, HCTZ  -Current home readings: 124/80 - controlled normally like this at home -Current exercise habits: has elliptical at home and will do once or twice per day -Denies hypotensive/hypertensive symptoms -Educated on BP goals and benefits of medications for prevention of heart attack, stroke and kidney damage; Exercise goal of 150 minutes per week; Importance of home blood pressure monitoring; -Counseled to monitor BP at home a few times per week, document, and provide log at future appointments -Counseled on diet and exercise extensively Recommended to continue current medication Amlodipine could be causing some of her feet swelling, it resolves when she elevates.  She denies any SOB or anything associated.  Continue meds for now, FU if swelling gets worse.  Hyperlipidemia: (LDL goal < 70) 11/23/20 -Controlled, LDL 71 at last visit -Current treatment: Atorvastatin '20mg'$  Appropriate, Effective, Safe, Accessible -Medications previously tried: pravastatin   -Current exercise habits: see HTN -Educated on Cholesterol goals;  Benefits of statin for ASCVD risk reduction; Importance of limiting foods high in  cholesterol; -Recommended to continue current medication Not quite at LDL goal < 70 with DM.  Given age and how borderline it is - no changes at this time. Could consider dose increase of statin if LDL elevates. Current ASCVD risk is 24.2% - high risk so could consider high intensity statin in the future.  Diabetes (A1c goal <7%) 11/23/22 -Uncontrolled, most recent A1c is 8.3% -Current medications: Januvia '100mg'$  Appropriate, Effective, Safe, Accessible Metformin '1000mg'$  bid  Appropriate, Effective, Safe, Accessible Farxiga '10mg'$  daily Appropriate, Effective, Query Safe,  -Medications previously tried: [place] (cost)  -Current home glucose readings fasting glucose: 140-170 post prandial glucose: N/A -Denies hypoglycemic/hyperglycemic symptoms -Exercise - elliptical machine (7-15 minutes daily), treadmill about 3x per weeek -Dietary recall: B - oatmeal w/ blueberries (cinnamon sugar/honey), 2 slices of bacon L - apple/orange D - broiled fish, yams, fried okra Drinks - coffee w/ cream or sugar, water, sweet tea -Educated on A1c and blood sugar goals; -her most recent A1c was elevated, Dr. Carlota Raspberry increased the dose of both her Januvia and [place].  She says she has not seen a huge difference in home readings. -Diet recall included a lot of carbohydrate sources - educated on effect on glucose today and created a plan to try to limit servings of these. -Patient also to increase her physical activity at home! -Will have [place] first of next year. Check A1c at next OV.   Hx of Breast Cancer (Goal: Remain in remission) -Controlled, not assessed -Current treatment  Tamoxifen '20mg'$  Appropriate, Effective, Safe, Accessible -Medications previously tried: none noted -Adherent with medication, she has to take for 5 years.  -Recommended to continue current medication Denies any adverse effects  Patient Goals/Self-Care Activities Patient will:  - take medications as prescribed as evidenced by patient report and record review check glucose fasting, document, and provide at future appointments collaborate with provider on medication access solutions  Follow Up Plan: The care management team will reach out to the patient again over the next 180 days.           The patient verbalized understanding of instructions, educational materials, and care plan provided today and DECLINED offer to receive copy of patient instructions, educational materials, and care plan.   Telephone follow up appointment with pharmacy team member scheduled for: 3 months  Edythe Clarity, [place], PharmD Clinical Pharmacist  [facility] [phone]

## 2022-12-02 ENCOUNTER — Other Ambulatory Visit: Payer: Self-pay | Admitting: Family Medicine

## 2023-01-03 ENCOUNTER — Telehealth: Payer: Self-pay | Admitting: Pharmacist

## 2023-01-03 NOTE — Telephone Encounter (Signed)
Called [employer] for updated status on PAP for Januvia.  Application was mailed 37/09/64 - they still do not have record of it.  I am placing another copy in the mail today.  Patient called and made aware.  She has picked up 30 days supply from pharmacy in the mean time.  Will FU in 7-10 days.  Beverly Milch, PharmD Clinical Pharmacist  [facility] [phone]

## 2023-01-31 ENCOUNTER — Telehealth: Payer: Self-pay | Admitting: Pharmacist

## 2023-01-31 NOTE — Progress Notes (Cosign Needed)
Called to check on status of patient's Januvia PAP. Per [employer] rep patient was originally denied due to having other coverage. An attestation form was mailed to patient for them to sign stating they have a hardship and her application will be approved. Form must be manually mailed in per rep. Called and left detailed message with instructions for patient to complete form and mail it in. Asked pt to call me back if any questions or concerns. CPP updated on status.  [facility]

## 2023-02-02 ENCOUNTER — Ambulatory Visit (INDEPENDENT_AMBULATORY_CARE_PROVIDER_SITE_OTHER): Payer: Medicare Other | Admitting: Family Medicine

## 2023-02-02 ENCOUNTER — Encounter: Payer: Self-pay | Admitting: Family Medicine

## 2023-02-02 VITALS — BP 124/60 | HR 62 | Temp 97.9°F | Ht 64.0 in | Wt 188.8 lb

## 2023-02-02 DIAGNOSIS — E119 Type 2 diabetes mellitus without complications: Secondary | ICD-10-CM

## 2023-02-02 DIAGNOSIS — E1129 Type 2 diabetes mellitus with other diabetic kidney complication: Secondary | ICD-10-CM | POA: Diagnosis not present

## 2023-02-02 DIAGNOSIS — R809 Proteinuria, unspecified: Secondary | ICD-10-CM | POA: Diagnosis not present

## 2023-02-02 LAB — COMPREHENSIVE METABOLIC PANEL
ALT: 12 U/L (ref 0–35)
AST: 14 U/L (ref 0–37)
Albumin: 3.6 g/dL (ref 3.5–5.2)
Alkaline Phosphatase: 51 U/L (ref 39–117)
BUN: 18 mg/dL (ref 6–23)
CO2: 25 mEq/L (ref 19–32)
Calcium: 10.5 mg/dL (ref 8.4–10.5)
Chloride: 105 mEq/L (ref 96–112)
Creatinine, Ser: 1.08 mg/dL (ref 0.40–1.20)
GFR: 50.02 mL/min — ABNORMAL LOW (ref >60.00)
Glucose, Bld: 161 mg/dL — ABNORMAL HIGH (ref 70–99)
Potassium: 4.2 mEq/L (ref 3.5–5.1)
Sodium: 138 mEq/L (ref 135–145)
Total Bilirubin: 0.5 mg/dL (ref 0.2–1.2)
Total Protein: 6.9 g/dL (ref 6.0–8.3)

## 2023-02-02 LAB — HEMOGLOBIN A1C: Hgb A1c MFr Bld: 9 % — ABNORMAL HIGH (ref 4.6–6.5)

## 2023-02-02 LAB — GLUCOSE, POCT (MANUAL RESULT ENTRY): POC Glucose: 164 mg/dl — AB (ref 70–99)

## 2023-02-02 NOTE — Patient Instructions (Signed)
I will look at your labs, but may need to c[place]gne [place] to med like trulicity, ozempic or mounjaro. I will let you know. No changes for now. I can coordinate a plan with your pharmacist as well. Take care!

## 2023-02-02 NOTE — Progress Notes (Signed)
Subjective:  Patient ID: Susan Miles, female    DOB: 10/18/1946  Age: 77 y.o. MRN: [id number]  CC:  Chief Complaint  Patient presents with   Diabetes    Pt reports higher numbers with new medications since last visit 130-207    HPI Susan Miles presents for   Diabetes: Complicated by microalbuminuria, CKD, and hyperglycemia.  Has been followed by pharmacist as well.  Medication assistance for Susan Miles.  Also treated with metformin 1000 mg twice daily, glipizide previously, discontinued in October.  She is on statin with Lipitor, losartan for ARB.  Higher dose farxiga last visit. Tolerating higher dose - no new genital/urinary infections. On meds consistently since last visit, no missed doses.  Still on lower dose januvia - '50mg'$ , '100mg'$  never filled.  Has been on higher dose farxiaga - '10mg'$ .  Home readings:  Fasting: 130-207, usually 150-160 range.  Postprandial: mid to upper 100's, occasional 200's No sx lows.  Microalbumin: 7.0 on 04/07/22.  Optho, foot exam, pneumovax: UTD  Lab Results  Component Value Date   HGBA1C 8.3 (H) 11/02/2022   HGBA1C 7.4 (A) 08/01/2022   HGBA1C 7.2 (H) 04/07/2022   Lab Results  Component Value Date   MICROALBUR 7.0 (H) 04/07/2022   LDLCALC 71 11/02/2022   CREATININE 1.04 11/02/2022     History Patient Active Problem List   Diagnosis Date Noted   Cellulitis and abscess of trunk 07/29/2022   Family history of breast cancer    Ductal carcinoma in situ (DCIS) of right breast 07/04/2019   Hyperlipidemia 12/21/2017   Breast asymmetry 05/01/2015   Cataract, nuclear 05/24/2012   Benign hypertension 02/10/2012   Type 2 diabetes mellitus without complications ([place]) 123456    Past Medical History:  Diagnosis Date   Breast cancer ([place])    Cancer ([place])    right breast cancer   Diabetes mellitus without complication ([place])    Family history of breast cancer    Hypertension    Personal history of radiation therapy     Sleep apnea    does not use CPAP      Review of Systems  Constitutional:  Negative for fatigue and unexpected weight change.  Respiratory:  Negative for chest tightness and shortness of breath.   Cardiovascular:  Negative for chest pain, palpitations and leg swelling.  Gastrointestinal:  Negative for abdominal pain and blood in stool.  Neurological:  Negative for dizziness, syncope, light-headedness and headaches.     Objective:   Vitals:   02/02/23 1020  BP: 124/60  Pulse: 62  Temp: 97.9 F (36.6 C)  TempSrc: Temporal  SpO2: 96%  Weight: 188 lb 12.8 oz (85.6 kg)  Height: '5\' 4"'$  (1.626 m)     Physical Exam Vitals reviewed.  Constitutional:      Appearance: Normal appearance. She is well-developed.  HENT:     Head: Normocephalic and atraumatic.  Eyes:     Conjunctiva/sclera: Conjunctivae normal.     Pupils: Pupils are equal, round, and reactive to light.  Neck:     Vascular: No carotid bruit.  Cardiovascular:     Rate and Rhythm: Normal rate and regular rhythm.     Heart sounds: Normal heart sounds.  Pulmonary:     Effort: Pulmonary effort is normal.     Breath sounds: Normal breath sounds.  Abdominal:     Palpations: Abdomen is soft. There is no pulsatile mass.     Tenderness: There is no abdominal tenderness.  Musculoskeletal:  Right lower leg: No edema.     Left lower leg: No edema.  Skin:    General: Skin is warm and dry.  Neurological:     Mental Status: She is alert and oriented to person, place, and time.  Psychiatric:        Mood and Affect: Mood normal.        Behavior: Behavior normal.    Glucose 164 today.  Assessment & Plan:  Susan Miles is a 77 y.o. female . Type 2 diabetes mellitus with microalbuminuria, without long-term current use of insulin (HCC) -     POCT glucose (manual entry) -     Hemoglobin A1c -     Comprehensive metabolic panel  Type 2 diabetes mellitus without complication, without long-term current use of insulin  (HCC) Assessment & Plan: Uncontrolled by home readings, previous A1c uncontrolled.  Has tolerated higher dose of Farxiga, unfortunately still at the 50 mg dose of Januvia.  However based on her home readings and prior A1c may change from DPP 4 to GLP-1/GIP for improved control.  Cost/coverage may be limiter.  Check c-Met, A1c, discussed with pharmacist to look at medication options, briefly demonstrated Mounjaro device in office today.  55-monthfollow-up      There are no Patient Instructions on file for this visit.    Signed,   JMerri Ray MD [facility] [facility] 02/02/23 10:35 AM

## 2023-02-02 NOTE — Congregational Nurse Program (Signed)
  Dept: [phone]   Congregational Nurse Program Note  Date of Encounter: 01/08/2023  Past Medical History: Past Medical History:  Diagnosis Date   Breast cancer ([place])    Cancer ([place])    right breast cancer   Diabetes mellitus without complication ([place])    Family history of breast cancer    Hypertension    Personal history of radiation therapy    Sleep apnea    does not use CPAP    Encounter Details:  CNP Questionnaire - 01/08/23 1330       Questionnaire   Ask client: Do you give verbal consent for me to treat you today? Yes    Student Assistance N/A    Location Patient [place]    Visit Setting with Client [place]    Patient Status Unknown    Insurance Medicare    Insurance/Financial Assistance Referral N/A    Medication N/A    Medical Provider Yes    Screening Referrals Made N/A    Medical Referrals Made N/A    Medical Appointment Made N/A    Recently w/o PCP, now 1st time PCP visit completed due to CNs referral or appointment made N/A    Food N/A    Transportation N/A    Housing/Utilities N/A    Interpersonal Safety N/A    Interventions Advocate/Support;Counsel;Spiritual Care;Educate    Abnormal to Normal Screening Since Last CN Visit N/A    Screenings CN Performed Blood Pressure;Temperature;Pulse Ox    Sent Client to Lab for: N/A    Did client attend any of the following based off CNs referral or appointments made? N/A    ED Visit Averted N/A    Life-Saving Intervention Made N/A             Today's Vitals   01/08/23 1330  BP: 134/76  Pulse: 75  Resp: 18  Temp: (!) 97.4 F (36.3 C)  TempSrc: Temporal  SpO2: 97%   There is no height or weight on file to calculate BMI.  Patient came into clinic for vitals to be assessed. The patient has concerns about her medication for diabetes. Patient was not able to get her medication and stated she has reached out to the office. Patient states her blood glucose has been elevated with new  medication. Patient has an appointment to discuss with physician. Nurse helped patient discussed concerns and formulate questions to ask the physician. Spiritual care given. Patient state she will follow up with nurse in a couple weeks concerning this matter.

## 2023-02-02 NOTE — Assessment & Plan Note (Signed)
Uncontrolled by home readings, previous A1c uncontrolled.  Has tolerated higher dose of Farxiga, unfortunately still at the 50 mg dose of Januvia.  However based on her home readings and prior A1c may change from DPP 4 to GLP-1/GIP for improved control.  Cost/coverage may be limiter.  Check c-Met, A1c, discussed with pharmacist to look at medication options, briefly demonstrated Mounjaro device in office today.  77-monthfollow-up

## 2023-02-22 DIAGNOSIS — H5213 Myopia, bilateral: Secondary | ICD-10-CM | POA: Diagnosis not present

## 2023-02-22 DIAGNOSIS — H52223 Regular astigmatism, bilateral: Secondary | ICD-10-CM | POA: Diagnosis not present

## 2023-02-22 DIAGNOSIS — E119 Type 2 diabetes mellitus without complications: Secondary | ICD-10-CM | POA: Diagnosis not present

## 2023-02-22 DIAGNOSIS — H2513 Age-related nuclear cataract, bilateral: Secondary | ICD-10-CM | POA: Diagnosis not present

## 2023-02-22 DIAGNOSIS — H524 Presbyopia: Secondary | ICD-10-CM | POA: Diagnosis not present

## 2023-02-22 LAB — HM DIABETES EYE EXAM

## 2023-03-10 ENCOUNTER — Telehealth: Payer: Self-pay | Admitting: Pharmacist

## 2023-03-10 NOTE — Progress Notes (Signed)
Care Management & Coordination Services Pharmacy Team   Reason for Encounter: Follow up on Ozempic PAP   Called [employer] to check on status of Ozempic PAP for patient. Missing proof of income and copy of Medicare part D insurance card front and back. Letter was mailed to patient on 03/06/23 with instructions of what documents are needed. May fax to the following fax number along with patient's ID #. CPP updated on status of PAP.   Fax # [phone] ATTN: Patient's ID# [id number]

## 2023-03-14 DIAGNOSIS — E872 Acidosis, unspecified: Secondary | ICD-10-CM | POA: Diagnosis not present

## 2023-03-14 DIAGNOSIS — N182 Chronic kidney disease, stage 2 (mild): Secondary | ICD-10-CM | POA: Diagnosis not present

## 2023-03-14 DIAGNOSIS — E559 Vitamin D deficiency, unspecified: Secondary | ICD-10-CM | POA: Diagnosis not present

## 2023-03-14 DIAGNOSIS — R809 Proteinuria, unspecified: Secondary | ICD-10-CM | POA: Diagnosis not present

## 2023-03-14 DIAGNOSIS — I129 Hypertensive chronic kidney disease with stage 1 through stage 4 chronic kidney disease, or unspecified chronic kidney disease: Secondary | ICD-10-CM | POA: Diagnosis not present

## 2023-03-14 DIAGNOSIS — E1122 Type 2 diabetes mellitus with diabetic chronic kidney disease: Secondary | ICD-10-CM | POA: Diagnosis not present

## 2023-03-17 ENCOUNTER — Other Ambulatory Visit: Payer: Self-pay | Admitting: Family Medicine

## 2023-03-21 ENCOUNTER — Encounter (INDEPENDENT_AMBULATORY_CARE_PROVIDER_SITE_OTHER): Payer: Medicare Other | Admitting: Ophthalmology

## 2023-03-21 DIAGNOSIS — I10 Essential (primary) hypertension: Secondary | ICD-10-CM | POA: Diagnosis not present

## 2023-03-21 DIAGNOSIS — H2513 Age-related nuclear cataract, bilateral: Secondary | ICD-10-CM | POA: Diagnosis not present

## 2023-03-21 DIAGNOSIS — H35033 Hypertensive retinopathy, bilateral: Secondary | ICD-10-CM

## 2023-03-21 DIAGNOSIS — H43813 Vitreous degeneration, bilateral: Secondary | ICD-10-CM | POA: Diagnosis not present

## 2023-03-29 ENCOUNTER — Telehealth: Payer: Self-pay | Admitting: Family Medicine

## 2023-03-29 NOTE — Telephone Encounter (Signed)
Patient dropped off a envelope with Dr.Greene name on it. Placed in front bin

## 2023-03-29 NOTE — Telephone Encounter (Signed)
Envolope picked up and placed in Dr. Paralee Cancel folder.

## 2023-04-05 ENCOUNTER — Telehealth: Payer: Self-pay | Admitting: Pharmacist

## 2023-04-05 ENCOUNTER — Telehealth: Payer: Medicare Other

## 2023-04-05 ENCOUNTER — Encounter: Payer: Medicare Other | Admitting: Pharmacist

## 2023-04-05 NOTE — Progress Notes (Incomplete)
Care Management & Coordination Services Pharmacy Note  04/05/2023 Name:  Susan Miles MRN:  [id number] DOB:  1946-06-26  Summary: ***  Recommendations/Changes made from today's visit: ***  Follow up plan: ***   Subjective: Susan Miles is an 77 y.o. year old female who is a primary patient of Neva Seat, Asencion Partridge, MD.  The care coordination team was consulted for assistance with disease management and care coordination needs.    Engaged with patient by telephone for follow up visit.  Recent office visits: ***  Recent consult visits: ***  Hospital visits: {Hospital DC Yes/No:25215}   Objective:  Lab Results  Component Value Date   CREATININE 1.08 02/02/2023   BUN 18 02/02/2023   GFR 50.02 (L) 02/02/2023   GFRNONAA 53 (L) 12/15/2020   GFRAA 61 12/15/2020   NA 138 02/02/2023   K 4.2 02/02/2023   CALCIUM 10.5 02/02/2023   CO2 25 02/02/2023   GLUCOSE 161 (H) 02/02/2023    Lab Results  Component Value Date/Time   HGBA1C 9.0 (H) 02/02/2023 10:58 AM   HGBA1C 8.3 (H) 11/02/2022 11:22 AM   HGBA1C 7.4 (A) 08/01/2022 11:49 AM   GFR 50.02 (L) 02/02/2023 10:58 AM   GFR 52.43 (L) 11/02/2022 11:22 AM   MICROALBUR 7.0 (H) 04/07/2022 11:56 AM   MICROALBUR 7.5 03/23/2016 10:16 AM    Last diabetic Eye exam:  Lab Results  Component Value Date/Time   HMDIABEYEEXA No Retinopathy 01/03/2020 12:00 AM    Last diabetic Foot exam: No results found for: "HMDIABFOOTEX"   Lab Results  Component Value Date   CHOL 135 11/02/2022   HDL 41.90 11/02/2022   LDLCALC 71 11/02/2022   TRIG 109.0 11/02/2022   CHOLHDL 3 11/02/2022       Latest Ref Rng & Units 02/02/2023   10:58 AM 11/02/2022   11:22 AM 04/07/2022   11:56 AM  Hepatic Function  Total Protein 6.0 - 8.3 g/dL 6.9  7.3  7.1   Albumin 3.5 - 5.2 g/dL 3.6  3.9  3.9   AST 0 - 37 U/L 14  19  21    ALT 0 - 35 U/L 12  17  20    Alk Phosphatase 39 - 117 U/L 51  46  52   Total Bilirubin 0.2 - 1.2 mg/dL 0.5  0.4  0.4      Lab Results  Component Value Date/Time   TSH 2.15 04/07/2022 11:56 AM   TSH 3.310 08/26/2020 11:43 AM       Latest Ref Rng & Units 04/07/2022   11:56 AM 08/26/2020   11:43 AM 07/10/2019    7:47 AM  CBC  WBC 4.0 - 10.5 K/uL 4.4  5.4  5.4   Hemoglobin 12.0 - 15.0 g/dL 40.9  81.1  91.4   Hematocrit 36.0 - 46.0 % 40.1  39.9  41.5   Platelets 150.0 - 400.0 K/uL 248.0  263  329     No results found for: "VD25OH", "VITAMINB12"  Clinical ASCVD: {YES/NO:21197} The 10-year ASCVD risk score (Arnett DK, et al., 2019) is: 21.7%   Values used to calculate the score:     Age: 62 years     Sex: Female     Is Non-Hispanic African American: Yes     Diabetic: Yes     Tobacco smoker: No     Systolic Blood Pressure: 124 mmHg     Is BP treated: Yes     HDL Cholesterol: 41.9 mg/dL     Total Cholesterol:  135 mg/dL    ***Other: ([id number] if Afib, MMRC or CAT for COPD, ACT, DEXA)     02/02/2023   10:18 AM 11/02/2022   10:40 AM 10/12/2022   11:52 AM  Depression screen PHQ 2/9  Decreased Interest 2 0 0  Down, Depressed, Hopeless 0 0 0  PHQ - 2 Score 2 0 0  Altered sleeping 0 0 1  Tired, decreased energy 2 2 1   Change in appetite 0 0 0  Feeling bad or failure about yourself  0 0 0  Trouble concentrating 0 1 0  Moving slowly or fidgety/restless 0 0 0  Suicidal thoughts 0 0 0  PHQ-9 Score 4 3 2      Social History   Tobacco Use  Smoking Status Never  Smokeless Tobacco Never   BP Readings from Last 3 Encounters:  02/02/23 124/60  01/08/23 134/76  11/02/22 138/70   Pulse Readings from Last 3 Encounters:  02/02/23 62  01/08/23 75  11/02/22 67   Wt Readings from Last 3 Encounters:  02/02/23 188 lb 12.8 oz (85.6 kg)  11/06/22 192 lb (87.1 kg)  11/02/22 193 lb 9.6 oz (87.8 kg)   BMI Readings from Last 3 Encounters:  02/02/23 32.41 kg/m  11/06/22 32.96 kg/m  11/02/22 33.23 kg/m    Allergies  Allergen Reactions   Ace Inhibitors Cough    Medications Reviewed Today      Reviewed by Shade Flood, MD (Physician) on 02/02/23 at 1057  Med List Status: <None>   Medication Order Taking? Sig Documenting Provider Last Dose Status Informant  ACCU-CHEK AVIVA PLUS test strip [id number] Yes USE TO TEST BLOOD SUGAR ONCE DAILY. DX E11.9 Shade Flood, MD Taking Active   acetaminophen (TYLENOL) 500 MG tablet [id number] Yes Take 500 mg by mouth every 6 (six) hours as needed for mild pain. [provider] Taking Active Self  amLODipine (NORVASC) 10 MG tablet [id number] Yes Take 1 tablet (10 mg total) by mouth daily. Shade Flood, MD Taking Active   aspirin 81 MG tablet [id number] Yes Take 81 mg by mouth daily. [provider] Taking Active Self  atorvastatin (LIPITOR) 20 MG tablet [id number] Yes TAKE 1 TABLET BY MOUTH DAILY AT Maude Leriche, MD Taking Active   Blood Glucose Monitoring Suppl (LDR BLOOD GLUCOSE TRUETEST) w/Device KIT [id number] Yes 1 Device by Does not apply route daily. Test blood sugar once daily. Dx: E11.9 Shade Flood, MD Taking Active   cloNIDine (CATAPRES) 0.2 MG tablet [id number] Yes Take 1 tablet (0.2 mg total) by mouth 2 (two) times daily. Shade Flood, MD Taking Active   dapagliflozin propanediol (FARXIGA) 10 MG TABS tablet [id number] Yes Take 1 tablet (10 mg total) by mouth daily before breakfast. Shade Flood, MD Taking Active   fexofenadine [facility]) 180 MG tablet [id number] Yes Take 1 tablet (180 mg total) by mouth daily. Shade Flood, MD Taking Active   fluticasone [facility]) 50 MCG/ACT nasal spray [id number] Yes Place 1-2 sprays into both nostrils daily. Shade Flood, MD Taking Active   Lancets MISC [id number] Yes Test blood sugar once daily. Dx: E11.9 Shade Flood, MD Taking Active   losartan (COZAAR) 100 MG tablet [id number] Yes Take 1 tablet (100 mg total) by mouth daily. Shade Flood, MD Taking Active   metFORMIN (GLUCOPHAGE) 1000 MG tablet [id number] Yes Take 1 tablet (1,000 mg total)  by mouth 2 (two) times daily with a meal. Shade Flood, MD Taking Active  metoprolol (TOPROL-XL) 200 MG 24 hr tablet [id number] Yes Take 1 tablet (200 mg total) by mouth daily. Shade Flood, MD Taking Active   sitaGLIPtin (JANUVIA) 100 MG tablet [id number] No Take 100 mg by mouth daily.  Patient not taking: Reported on 02/02/2023   [provider] Not Taking Active   sitaGLIPtin (JANUVIA) 50 MG tablet [id number] Yes Take 1 tablet (50 mg total) by mouth daily. Shade Flood, MD Taking Active   tamoxifen (NOLVADEX) 20 MG tablet [id number] Yes Take 1 tablet (20 mg total) by mouth daily. Serena Croissant, MD Taking Active             SDOH:  (Social Determinants of Health) assessments and interventions performed: {yes/no:20286} SDOH Interventions    Flowsheet Row Clinical Support from 05/19/2022 in [facility]  SDOH Interventions   Food Insecurity Interventions Intervention Not Indicated  Housing Interventions Intervention Not Indicated  Transportation Interventions Intervention Not Indicated  Financial Strain Interventions [id number] Referral  Physical Activity Interventions Intervention Not Indicated  Social Connections Interventions Intervention Not Indicated       Medication Assistance: {MEDASSISTANCEINFO:25044}  Medication Access: Within the past 30 days, how often has patient missed a dose of medication? *** Is a pillbox or other method used to improve adherence? {YES/NO:21197} Factors that may affect medication adherence? {CHL DESC; BARRIERS:21522} Are meds synced by current pharmacy? {YES/NO:21197} Are meds delivered by current pharmacy? {YES/NO:21197} Does patient experience delays in picking up medications due to transportation concerns? {YES/NO:21197}  [facility] Reviewed: Is patient disadvantaged to use [facility]?: {YES/NO:21197} Current Rx insurance plan: *** Name and location of Current pharmacy:   [id number] [place], [place] - [address] [place] [address] Phone: [phone] Fax: [phone]  [facility] - [place], [place] - [address] [place] [address] Phone: [phone] Fax: [phone]  [facility] services reviewed with patient today?: {YES/NO:21197} Patient requests to transfer care to [facility]?: {YES/NO:21197} Reason patient declined to change pharmacies: {US patient preference:27474}  Compliance/Adherence/Medication fill history: Care Gaps: ***  Star-Rating Drugs: ***   Assessment/Plan     Hypertension (BP goal <130/80) -Controlled, based on recent office visits and home monitoring -Current treatment: Amlodipine 10mg  Appropriate, Effective, Safe, Accessible Losartan 100mg  Appropriate, Effective, Safe, Accessible Metoprolol XL 200mg  Appropriate, Effective, Safe, Accessible Clonidine 0.2mg  twice daily Appropriate, Effective, Safe, Accessible -Medications previously tried: valsartan, HCTZ  -Current home readings: 124/80 - controlled normally like this at home -Current exercise habits: has elliptical at home and will do once or twice per day -Denies hypotensive/hypertensive symptoms -Educated on BP goals and benefits of medications for prevention of heart attack, stroke and kidney damage; Exercise goal of 150 minutes per week; Importance of home blood pressure monitoring; -Counseled to monitor BP at home a few times per week, document, and provide log at future appointments -Counseled on diet and exercise extensively Recommended to continue current medication Amlodipine could be causing some of her feet swelling, it resolves when she elevates.  She denies any SOB or anything associated.  Continue meds for now, FU if swelling gets worse.  Hyperlipidemia: (LDL goal < 70) 11/23/20 -Controlled, LDL 71 at last visit -Current treatment: Atorvastatin 20mg  Appropriate,  Effective, Safe, Accessible -Medications previously tried: pravastatin   -Current exercise habits: see HTN -Educated on Cholesterol goals;  Benefits of statin for ASCVD risk reduction; Importance of limiting foods high in cholesterol; -Recommended to continue current  medication Not quite at LDL goal < 70 with DM.  Given age and how borderline it is - no changes at this time. Could consider dose increase of statin if LDL elevates. Current ASCVD risk is 24.2% - high risk so could consider high intensity statin in the future.  Diabetes (A1c goal <7%) 11/23/22 -Uncontrolled, most recent A1c is 8.3% -Current medications: Januvia 100mg  Appropriate, Effective, Safe, Accessible Metformin 1000mg  bid Appropriate, Effective, Safe, Accessible Farxiga 10mg  daily Appropriate, Effective, Query Safe,  -Medications previously tried: [place] (cost)  -Current home glucose readings fasting glucose: 140-170 post prandial glucose: N/A -Denies hypoglycemic/hyperglycemic symptoms -Exercise - elliptical machine (7-15 minutes daily), treadmill about 3x per weeek -Dietary recall: B - oatmeal w/ blueberries (cinnamon sugar/honey), 2 slices of bacon L - apple/orange D - broiled fish, yams, fried okra Drinks - coffee w/ cream or sugar, water, sweet tea -Educated on A1c and blood sugar goals; -her most recent A1c was elevated, Dr. Neva Seat increased the dose of both her Januvia and [place].  She says she has not seen a huge difference in home readings. -Diet recall included a lot of carbohydrate sources - educated on effect on glucose today and created a plan to try to limit servings of these. -Patient also to increase her physical activity at home! -Will have CMA Fu first of next year. Check A1c at next OV.   Hx of Breast Cancer (Goal: Remain in remission) -Controlled, not assessed -Current treatment  Tamoxifen 20mg  Appropriate, Effective, Safe, Accessible -Medications previously tried: none noted -Adherent  with medication, she has to take for 5 years.  -Recommended to continue current medication Denies any adverse effects           Willa Frater, PharmD Clinical Pharmacist  [facility] [phone]

## 2023-04-05 NOTE — Telephone Encounter (Signed)
Updated information faxed into program.  Will FU end of the week.

## 2023-04-05 NOTE — Progress Notes (Signed)
Care Management & Coordination Services Pharmacy Team  Reason for Encounter: Appointment Reminder  Contacted patient to confirm telephone appointment with Erskine Emery, PharmD on 04/05/2023 at 2 pm. Unsuccessful outreach. Left voicemail with appointment details.  Star Rating Drugs:  Atorvastatin 10 mg last filled 03/17/2023 90 DS Glipizide 10 mg last filled 02/26/2023 90 DS Losartan Potassium 100 mg last filled 02/06/2023 90 DS Metformin 1000 mg last filled 02/06/2023 90 DS Januvia 50 mg last filled 01/19/2023 30 DS   Care Gaps: Annual wellness visit in last year? Yes  If Diabetic: Last eye exam / retinopathy screening: 4/25//2023 Last diabetic foot exam: 04/07/2022   Future Appointments  Date Time Provider Department Center  04/05/2023  2:00 PM Erroll Luna, [facility] None  05/03/2023 10:20 AM Shade Flood, MD [facility]  05/25/2023 12:00 PM [facility] ANNUAL WELLNESS VISIT [facility]  07/10/2023 11:15 AM Serena Croissant, MD [facility] None   April D Calhoun, [facility] Clinical Pharmacist Assistant [phone]

## 2023-04-14 ENCOUNTER — Encounter: Payer: Self-pay | Admitting: Family Medicine

## 2023-04-20 NOTE — Congregational Nurse Program (Signed)
  Dept: [phone]   Congregational Nurse Program Note  Date of Encounter: 03/19/2023  Past Medical History: Past Medical History:  Diagnosis Date   Breast cancer (HCC)    Cancer (HCC)    right breast cancer   Diabetes mellitus without complication (HCC)    Family history of breast cancer    Hypertension    Personal history of radiation therapy    Sleep apnea    does not use CPAP    Encounter Details:  CCNP Questionnaire completed.   Today's Vitals   03/19/23 1326  Weight: 187 lb (84.8 kg)  Height: 5\' 4"  (1.626 m)   Body mass index is 32.1 kg/m.  Patient seen in clinic. Patient has been losing weight. Patient expressed she was excited about her PCP and Pharmacy working together to her assist her with medication. Patient was able to get medication assistance for diabetic medications. Patient was filling out a hardship form for  [employer]. Patient expressed some concerns fasting blood glucose in the morning. Patient states she has been ranging 160-200 fasting in the mornings. Patient has informed her PCP. Patient has started drinking more water and made a goal to start measuring rice before plating it. Patient hopes these goal will help continue to lower Blood glucose along with medication. Patient states here PCP informed her to eat more foods high in iron. Patient is also taking Vitamin D 2000mg . Patient was confused why she was taking Vitamin D. Nurse informed patient on the benefits of taking vitamin D to assist with iron absorption. Encouraged patient to try to increase physical activity to assist with decreased energy. Patient seeing a specialist for retina on 03/22/23. Patient state she will continue to follow up with nurse to see patient progress.

## 2023-04-28 ENCOUNTER — Other Ambulatory Visit: Payer: Self-pay | Admitting: Family Medicine

## 2023-04-28 DIAGNOSIS — I10 Essential (primary) hypertension: Secondary | ICD-10-CM

## 2023-05-03 ENCOUNTER — Encounter: Payer: Self-pay | Admitting: Family Medicine

## 2023-05-03 ENCOUNTER — Ambulatory Visit (INDEPENDENT_AMBULATORY_CARE_PROVIDER_SITE_OTHER): Payer: Medicare Other | Admitting: Family Medicine

## 2023-05-03 DIAGNOSIS — I10 Essential (primary) hypertension: Secondary | ICD-10-CM

## 2023-05-03 DIAGNOSIS — R809 Proteinuria, unspecified: Secondary | ICD-10-CM | POA: Diagnosis not present

## 2023-05-03 DIAGNOSIS — E1129 Type 2 diabetes mellitus with other diabetic kidney complication: Secondary | ICD-10-CM

## 2023-05-03 DIAGNOSIS — E782 Mixed hyperlipidemia: Secondary | ICD-10-CM | POA: Diagnosis not present

## 2023-05-03 DIAGNOSIS — Z7984 Long term (current) use of oral hypoglycemic drugs: Secondary | ICD-10-CM

## 2023-05-03 LAB — MICROALBUMIN / CREATININE URINE RATIO
Creatinine,U: 58.6 mg/dL
Microalb Creat Ratio: 4.1 mg/g (ref 0.0–30.0)
Microalb, Ur: 2.4 mg/dL — ABNORMAL HIGH (ref 0.0–1.9)

## 2023-05-03 LAB — COMPREHENSIVE METABOLIC PANEL
ALT: 11 U/L (ref 0–35)
AST: 14 U/L (ref 0–37)
Albumin: 3.7 g/dL (ref 3.5–5.2)
Alkaline Phosphatase: 45 U/L (ref 39–117)
BUN: 16 mg/dL (ref 6–23)
CO2: 24 mEq/L (ref 19–32)
Calcium: 10 mg/dL (ref 8.4–10.5)
Chloride: 104 mEq/L (ref 96–112)
Creatinine, Ser: 1.09 mg/dL (ref 0.40–1.20)
GFR: 49.38 mL/min — ABNORMAL LOW (ref >60.00)
Glucose, Bld: 166 mg/dL — ABNORMAL HIGH (ref 70–99)
Potassium: 4.1 mEq/L (ref 3.5–5.1)
Sodium: 136 mEq/L (ref 135–145)
Total Bilirubin: 0.4 mg/dL (ref 0.2–1.2)
Total Protein: 7 g/dL (ref 6.0–8.3)

## 2023-05-03 LAB — LIPID PANEL
Cholesterol: 111 mg/dL (ref 0–200)
HDL: 39.9 mg/dL (ref >39.00)
LDL Cholesterol: 54 mg/dL (ref 0–99)
NonHDL: 70.88
Total CHOL/HDL Ratio: 3
Triglycerides: 82 mg/dL (ref 0.0–149.0)
VLDL: 16.4 mg/dL (ref 0.0–40.0)

## 2023-05-03 LAB — HEMOGLOBIN A1C: Hgb A1c MFr Bld: 8.5 % — ABNORMAL HIGH (ref 4.6–6.5)

## 2023-05-03 MED ORDER — SITAGLIPTIN PHOSPHATE 100 MG PO TABS: 100.0000 mg | ORAL_TABLET | Freq: Every day | ORAL | 1 refills | Status: DC

## 2023-05-03 MED ORDER — LOSARTAN POTASSIUM 100 MG PO TABS: 100.0000 mg | ORAL_TABLET | Freq: Every day | ORAL | 1 refills | Status: DC

## 2023-05-03 MED ORDER — METOPROLOL SUCCINATE ER 200 MG PO TB24: 200.0000 mg | ORAL_TABLET | Freq: Every day | ORAL | 1 refills | Status: DC

## 2023-05-03 MED ORDER — CLONIDINE HCL 0.2 MG PO TABS: 0.2000 mg | ORAL_TABLET | Freq: Two times a day (BID) | ORAL | 1 refills | Status: DC

## 2023-05-03 MED ORDER — AMLODIPINE BESYLATE 10 MG PO TABS: 10.0000 mg | ORAL_TABLET | Freq: Every day | ORAL | 1 refills | Status: DC

## 2023-05-03 MED ORDER — METFORMIN HCL 1000 MG PO TABS
1000.0000 mg | ORAL_TABLET | Freq: Two times a day (BID) | ORAL | 1 refills | Status: DC
Start: 2023-05-03 — End: 2023-10-18

## 2023-05-03 MED ORDER — DAPAGLIFLOZIN PROPANEDIOL 10 MG PO TABS: 10.0000 mg | ORAL_TABLET | Freq: Every day | ORAL | 5 refills | Status: DC

## 2023-05-03 MED ORDER — ATORVASTATIN CALCIUM 20 MG PO TABS: ORAL_TABLET | ORAL | 1 refills | Status: DC

## 2023-05-03 NOTE — Progress Notes (Signed)
Subjective:  Patient ID: Susan Miles, female    DOB: 1946-09-09  Age: 77 y.o. MRN: [id number]  CC:  Chief Complaint  Patient presents with   Medical Management of Chronic Issues    Pt is fasting; Atorvastatin dose increase to 20mg , d/c 10mg  from list.    HPI Giovanni Koke presents for   Diabetes: Complicated by hyperglycemia, microalbuminuria, CKD.  Also followed by pharmacist.  On medication assistance for Darien Ramus previously.  Metformin 1000 mg twice daily, glipizide discontinued last October.  She is on statin with Lipitor, losartan for ARB.  Had been on higher dose [place] but still elevated A1c at 9.0 in February.  Recommend starting GLP.  Patient's assistance completed for Ozempic. Still in process with assistance paperwork. Initial program not covered. Working on hardship program.  Home readings fasting:140 -180 Postprandial: 205-250.   Microalbumin: 04/07/22 - 7.0, repeat today.  Optho, foot exam, pneumovax:  Shingrix - received at pharmacy ([facility]), tdap and covid boosters also discussed - covid booster with flu vaccine in fall.  Foot exam today:  Diabetic Foot Exam - Simple   Simple Foot Form Diabetic Foot exam was performed with the following findings: Yes 05/03/2023 11:04 AM  Visual Inspection No deformities, no ulcerations, no other skin breakdown bilaterally: Yes Sensation Testing Intact to touch and monofilament testing bilaterally: Yes Pulse Check Posterior Tibialis and Dorsalis pulse intact bilaterally: Yes Comments     Lab Results  Component Value Date   HGBA1C 9.0 (H) 02/02/2023   HGBA1C 8.3 (H) 11/02/2022   HGBA1C 7.4 (A) 08/01/2022   Lab Results  Component Value Date   MICROALBUR 7.0 (H) 04/07/2022   LDLCALC 71 11/02/2022   CREATININE 1.08 02/02/2023    Hyperlipidemia: Lipitor 20 mg, increase last year.  No new myalgias/side effects.  Cracker and juice at 8:30am - 2.5hrs ago.  Lab Results  Component Value Date   CHOL  135 11/02/2022   HDL 41.90 11/02/2022   LDLCALC 71 11/02/2022   TRIG 109.0 11/02/2022   CHOLHDL 3 11/02/2022   Lab Results  Component Value Date   ALT 12 02/02/2023   AST 14 02/02/2023   ALKPHOS 51 02/02/2023   BILITOT 0.5 02/02/2023   Hypertension: Treated with amlodipine, losartan, Toprol, clonidine.  Chronic peripheral edema versus lymphedema, manages by avoiding excess sodium. No recent swelling - doing well.  Home readings: 120/78 No new med side effects.  BP Readings from Last 3 Encounters:  05/03/23 126/74  02/02/23 124/60  01/08/23 134/76   Lab Results  Component Value Date   CREATININE 1.08 02/02/2023       History Patient Active Problem List   Diagnosis Date Noted   Cellulitis and abscess of trunk 07/29/2022   Family history of breast cancer    Ductal carcinoma in situ (DCIS) of right breast 07/04/2019   Hyperlipidemia 12/21/2017   Breast asymmetry 05/01/2015   Cataract, nuclear 05/24/2012   Benign hypertension 02/10/2012   Type 2 diabetes mellitus without complications (HCC) 02/10/2012    Past Medical History:  Diagnosis Date   Breast cancer (HCC)    Cancer (HCC)    right breast cancer   Diabetes mellitus without complication (HCC)    Family history of breast cancer    Hypertension    Personal history of radiation therapy    Sleep apnea    does not use CPAP      Review of Systems  Constitutional:  Negative for fatigue and unexpected weight change.  Respiratory:  Negative for chest tightness and shortness of breath.   Cardiovascular:  Negative for chest pain, palpitations and leg swelling.  Gastrointestinal:  Negative for abdominal pain and blood in stool.  Neurological:  Negative for dizziness, syncope, light-headedness and headaches.     Objective:   Vitals:   05/03/23 1017  BP: 126/74  Pulse: 66  Temp: 98.4 F (36.9 C)  TempSrc: Oral  SpO2: 97%  Weight: 185 lb 6.4 oz (84.1 kg)  Height: 5\' 4"  (1.626 m)     Physical  Exam Vitals reviewed.  Constitutional:      Appearance: Normal appearance. She is well-developed.  HENT:     Head: Normocephalic and atraumatic.  Eyes:     Conjunctiva/sclera: Conjunctivae normal.     Pupils: Pupils are equal, round, and reactive to light.  Neck:     Vascular: No carotid bruit.  Cardiovascular:     Rate and Rhythm: Normal rate and regular rhythm.     Heart sounds: Normal heart sounds.  Pulmonary:     Effort: Pulmonary effort is normal.     Breath sounds: Normal breath sounds.  Abdominal:     Palpations: Abdomen is soft. There is no pulsatile mass.     Tenderness: There is no abdominal tenderness.  Musculoskeletal:     Right lower leg: No edema.     Left lower leg: No edema.  Skin:    General: Skin is warm and dry.  Neurological:     Mental Status: She is alert and oriented to person, place, and time.  Psychiatric:        Mood and Affect: Mood normal.        Behavior: Behavior normal.        Assessment & Plan:  Panayiota Brandner is a 77 y.o. female . Essential hypertension -     amLODIPine Besylate; Take 1 tablet (10 mg total) by mouth daily.  Dispense: 90 tablet; Refill: 1 -     cloNIDine HCl; Take 1 tablet (0.2 mg total) by mouth 2 (two) times daily.  Dispense: 180 tablet; Refill: 1 -     Losartan Potassium; Take 1 tablet (100 mg total) by mouth daily.  Dispense: 90 tablet; Refill: 1 -     Metoprolol Succinate ER; Take 1 tablet (200 mg total) by mouth daily.  Dispense: 90 tablet; Refill: 1  -  Stable, tolerating current regimen. Medications refilled. Labs pending as above.   Mixed hyperlipidemia -     Atorvastatin Calcium; TAKE 1 TABLET BY MOUTH DAILY AT 6PM  Dispense: 90 tablet; Refill: 1 -     Comprehensive metabolic panel -     Lipid panel  -  Stable, tolerating current regimen. Medications refilled. Labs pending as above.   Type 2 diabetes mellitus with microalbuminuria, without long-term current use of insulin (HCC) -     Dapagliflozin  Propanediol; Take 1 tablet (10 mg total) by mouth daily before breakfast.  Dispense: 30 tablet; Refill: 5 -     metFORMIN HCl; Take 1 tablet (1,000 mg total) by mouth 2 (two) times daily with a meal.  Dispense: 180 tablet; Refill: 1 -     SITagliptin Phosphate; Take 1 tablet (100 mg total) by mouth daily.  Dispense: 90 tablet; Refill: 1 -     Comprehensive metabolic panel -     Microalbumin / creatinine urine ratio -     Hemoglobin A1c Uncontrolled.  Pending Ozempic, but waiting on authorization, coverage.  Depending on timing  of that medication may need to change plan or adjust medication regimen.   Patient Instructions  Depending on labs today we may need to make changes for now until hearing more about the Ozempic coverage. Glipizide is an option, but may not be enough.   No other med changes for now.     Signed,   Meredith Staggers, MD [facility], [facility] 05/03/23 10:41 AM

## 2023-05-03 NOTE — Patient Instructions (Signed)
Depending on labs today we may need to make changes for now until hearing more about the Ozempic coverage. Glipizide is an option, but may not be enough.   No other med changes for now.

## 2023-05-13 ENCOUNTER — Other Ambulatory Visit: Payer: Self-pay | Admitting: Family Medicine

## 2023-05-13 DIAGNOSIS — J309 Allergic rhinitis, unspecified: Secondary | ICD-10-CM

## 2023-05-16 DIAGNOSIS — H2512 Age-related nuclear cataract, left eye: Secondary | ICD-10-CM | POA: Diagnosis not present

## 2023-05-16 DIAGNOSIS — H25013 Cortical age-related cataract, bilateral: Secondary | ICD-10-CM | POA: Diagnosis not present

## 2023-05-16 DIAGNOSIS — H2513 Age-related nuclear cataract, bilateral: Secondary | ICD-10-CM | POA: Diagnosis not present

## 2023-05-16 DIAGNOSIS — H25043 Posterior subcapsular polar age-related cataract, bilateral: Secondary | ICD-10-CM | POA: Diagnosis not present

## 2023-05-16 DIAGNOSIS — H18413 Arcus senilis, bilateral: Secondary | ICD-10-CM | POA: Diagnosis not present

## 2023-05-25 ENCOUNTER — Ambulatory Visit (INDEPENDENT_AMBULATORY_CARE_PROVIDER_SITE_OTHER): Payer: Medicare Other | Admitting: *Deleted

## 2023-05-25 DIAGNOSIS — Z0000 Encounter for general adult medical examination without abnormal findings: Secondary | ICD-10-CM | POA: Diagnosis not present

## 2023-05-25 NOTE — Patient Instructions (Signed)
Susan Miles , Thank you for taking time to come for your Medicare Wellness Visit. I appreciate your ongoing commitment to your health goals. Please review the following plan we discussed and let me know if I can assist you in the future.   Screening recommendations/referrals: Colonoscopy: no longer required Mammogram: up to date Bone Density: up to date Recommended yearly ophthalmology/optometry visit for glaucoma screening and checkup Recommended yearly dental visit for hygiene and checkup  Vaccinations: Influenza vaccine: up to date Pneumococcal vaccine: up to date Tdap vaccine: Education provided Shingles vaccine: up to date    Advanced directives: Education provided     Preventive Care 65 Years and Older, Female Preventive care refers to lifestyle choices and visits with your health care provider that can promote health and wellness. What does preventive care include? A yearly physical exam. This is also called an annual well check. Dental exams once or twice a year. Routine eye exams. Ask your health care provider how often you should have your eyes checked. Personal lifestyle choices, including: Daily care of your teeth and gums. Regular physical activity. Eating a healthy diet. Avoiding tobacco and drug use. Limiting alcohol use. Practicing safe sex. Taking low-dose aspirin every day. Taking vitamin and mineral supplements as recommended by your health care provider. What happens during an annual well check? The services and screenings done by your health care provider during your annual well check will depend on your age, overall health, lifestyle risk factors, and family history of disease. Counseling  Your health care provider may ask you questions about your: Alcohol use. Tobacco use. Drug use. Emotional well-being. Home and relationship well-being. Sexual activity. Eating habits. History of falls. Memory and ability to understand (cognition). Work and work  Astronomer. Reproductive health. Screening  You may have the following tests or measurements: Height, weight, and BMI. Blood pressure. Lipid and cholesterol levels. These may be checked every 5 years, or more frequently if you are over 94 years old. Skin check. Lung cancer screening. You may have this screening every year starting at age 68 if you have a 30-pack-year history of smoking and currently smoke or have quit within the past 15 years. Fecal occult blood test (FOBT) of the stool. You may have this test every year starting at age 80. Flexible sigmoidoscopy or colonoscopy. You may have a sigmoidoscopy every 5 years or a colonoscopy every 10 years starting at age 15. Hepatitis C blood test. Hepatitis B blood test. Sexually transmitted disease (STD) testing. Diabetes screening. This is done by checking your blood sugar (glucose) after you have not eaten for a while (fasting). You may have this done every 1-3 years. Bone density scan. This is done to screen for osteoporosis. You may have this done starting at age 35. Mammogram. This may be done every 1-2 years. Talk to your health care provider about how often you should have regular mammograms. Talk with your health care provider about your test results, treatment options, and if necessary, the need for more tests. Vaccines  Your health care provider may recommend certain vaccines, such as: Influenza vaccine. This is recommended every year. Tetanus, diphtheria, and acellular pertussis (Tdap, Td) vaccine. You may need a Td booster every 10 years. Zoster vaccine. You may need this after age 27. Pneumococcal 13-valent conjugate (PCV13) vaccine. One dose is recommended after age 42. Pneumococcal polysaccharide (PPSV23) vaccine. One dose is recommended after age 39. Talk to your health care provider about which screenings and vaccines you need and how  often you need them. This information is not intended to replace advice given to you by  your health care provider. Make sure you discuss any questions you have with your health care provider. Document Released: 12/18/2015 Document Revised: 08/10/2016 Document Reviewed: 09/22/2015 Elsevier Interactive Patient Education  2017 [employer].  Fall Prevention in the Home Falls can cause injuries. They can happen to people of all ages. There are many things you can do to make your home safe and to help prevent falls. What can I do on the outside of my home? Regularly fix the edges of walkways and driveways and fix any cracks. Remove anything that might make you trip as you walk through a door, such as a raised step or threshold. Trim any bushes or trees on the path to your home. Use bright outdoor lighting. Clear any walking paths of anything that might make someone trip, such as rocks or tools. Regularly check to see if handrails are loose or broken. Make sure that both sides of any steps have handrails. Any raised decks and porches should have guardrails on the edges. Have any leaves, snow, or ice cleared regularly. Use sand or salt on walking paths during winter. Clean up any spills in your garage right away. This includes oil or grease spills. What can I do in the bathroom? Use night lights. Install grab bars by the toilet and in the tub and shower. Do not use towel bars as grab bars. Use non-skid mats or decals in the tub or shower. If you need to sit down in the shower, use a plastic, non-slip stool. Keep the floor dry. Clean up any water that spills on the floor as soon as it happens. Remove soap buildup in the tub or shower regularly. Attach bath mats securely with double-sided non-slip rug tape. Do not have throw rugs and other things on the floor that can make you trip. What can I do in the bedroom? Use night lights. Make sure that you have a light by your bed that is easy to reach. Do not use any sheets or blankets that are too big for your bed. They should not hang  down onto the floor. Have a firm chair that has side arms. You can use this for support while you get dressed. Do not have throw rugs and other things on the floor that can make you trip. What can I do in the kitchen? Clean up any spills right away. Avoid walking on wet floors. Keep items that you use a lot in easy-to-reach places. If you need to reach something above you, use a strong step stool that has a grab bar. Keep electrical cords out of the way. Do not use floor polish or wax that makes floors slippery. If you must use wax, use non-skid floor wax. Do not have throw rugs and other things on the floor that can make you trip. What can I do with my stairs? Do not leave any items on the stairs. Make sure that there are handrails on both sides of the stairs and use them. Fix handrails that are broken or loose. Make sure that handrails are as long as the stairways. Check any carpeting to make sure that it is firmly attached to the stairs. Fix any carpet that is loose or worn. Avoid having throw rugs at the top or bottom of the stairs. If you do have throw rugs, attach them to the floor with carpet tape. Make sure that you have a  light switch at the top of the stairs and the bottom of the stairs. If you do not have them, ask someone to add them for you. What else can I do to help prevent falls? Wear shoes that: Do not have high heels. Have rubber bottoms. Are comfortable and fit you well. Are closed at the toe. Do not wear sandals. If you use a stepladder: Make sure that it is fully opened. Do not climb a closed stepladder. Make sure that both sides of the stepladder are locked into place. Ask someone to hold it for you, if possible. Clearly mark and make sure that you can see: Any grab bars or handrails. First and last steps. Where the edge of each step is. Use tools that help you move around (mobility aids) if they are needed. These  include: Canes. Walkers. Scooters. Crutches. Turn on the lights when you go into a dark area. Replace any light bulbs as soon as they burn out. Set up your furniture so you have a clear path. Avoid moving your furniture around. If any of your floors are uneven, fix them. If there are any pets around you, be aware of where they are. Review your medicines with your doctor. Some medicines can make you feel dizzy. This can increase your chance of falling. Ask your doctor what other things that you can do to help prevent falls. This information is not intended to replace advice given to you by your health care provider. Make sure you discuss any questions you have with your health care provider. Document Released: 09/17/2009 Document Revised: 04/28/2016 Document Reviewed: 12/26/2014 Elsevier Interactive Patient Education  2017 [employer].

## 2023-05-25 NOTE — Progress Notes (Signed)
Subjective:   Susan Miles is a 77 y.o. female who presents for Medicare Annual (Subsequent) preventive examination.  Visit Complete: Virtual  I connected with  Alyxandra Folino on 05/25/23 by a audio enabled telemedicine application and verified that I am speaking with the correct person using two identifiers.  Patient Location: Home  Provider Location: Home Office  I discussed the limitations of evaluation and management by telemedicine. The patient expressed understanding and agreed to proceed.    Review of Systems       Nutrition Risk Assessment:  Has the patient had any N/V/D within the last 2 months?  No  Does the patient have any non-healing wounds?  No  Has the patient had any unintentional weight loss or weight gain?  No   Diabetes:  Is the patient diabetic?  Yes  If diabetic, was a CBG obtained today?  No  Did the patient bring in their glucometer from home?  No  How often do you monitor your CBG's? 1x a day.   Financial Strains and Diabetes Management:  Are you having any financial strains with the device, your supplies or your medication? No .  Does the patient want to be seen by Chronic Care Management for management of their diabetes?  No  Would the patient like to be referred to a Nutritionist or for Diabetic Management?  No     Cardiac Risk Factors include: advanced age (>57men, >93 women);diabetes mellitus;hypertension;family history of premature cardiovascular disease     Objective:    Today's Vitals   There is no height or weight on file to calculate BMI.     05/25/2023   11:35 AM 05/19/2022    9:13 AM 08/27/2019   10:05 AM 08/09/2019   10:49 AM 08/02/2019    3:55 PM 11/20/2017    4:04 PM 08/03/2017    8:45 AM  Advanced Directives  Does Patient Have a Medical Advance Directive? No Yes No No No No Yes  Type of Tax inspector;Living will  Does patient want to make changes to medical advance  directive?  No - Patient declined       Copy of Healthcare Power of Attorney in Chart?       No - copy requested  Would patient like information on creating a medical advance directive? No - Patient declined   No - Patient declined No - Patient declined No - Patient declined     Current Medications (verified) Outpatient Encounter Medications as of 05/25/2023  Medication Sig   ACCU-CHEK AVIVA PLUS test strip USE TO TEST BLOOD SUGAR ONCE DAILY. DX E11.9   acetaminophen (TYLENOL) 500 MG tablet Take 500 mg by mouth every 6 (six) hours as needed for mild pain.   amLODipine (NORVASC) 10 MG tablet Take 1 tablet (10 mg total) by mouth daily.   aspirin 81 MG tablet Take 81 mg by mouth daily.   atorvastatin (LIPITOR) 20 MG tablet TAKE 1 TABLET BY MOUTH DAILY AT 6PM   Blood Glucose Monitoring Suppl (LDR BLOOD GLUCOSE TRUETEST) w/Device KIT 1 Device by Does not apply route daily. Test blood sugar once daily. Dx: E11.9   cloNIDine (CATAPRES) 0.2 MG tablet Take 1 tablet (0.2 mg total) by mouth 2 (two) times daily.   dapagliflozin propanediol (FARXIGA) 10 MG TABS tablet Take 1 tablet (10 mg total) by mouth daily before breakfast.   fexofenadine (ALLEGRA) 180 MG tablet Take 1 tablet (180 mg  total) by mouth daily.   fluticasone (FLONASE) 50 MCG/ACT nasal spray PLACE 1-2 SPRAYS INTO BOTH NOSTRILS DAILY.   Lancets MISC Test blood sugar once daily. Dx: E11.9   losartan (COZAAR) 100 MG tablet Take 1 tablet (100 mg total) by mouth daily.   metFORMIN (GLUCOPHAGE) 1000 MG tablet Take 1 tablet (1,000 mg total) by mouth 2 (two) times daily with a meal.   metoprolol (TOPROL-XL) 200 MG 24 hr tablet Take 1 tablet (200 mg total) by mouth daily.   sitaGLIPtin (JANUVIA) 100 MG tablet Take 1 tablet (100 mg total) by mouth daily.   tamoxifen (NOLVADEX) 20 MG tablet Take 1 tablet (20 mg total) by mouth daily.   No facility-administered encounter medications on file as of 05/25/2023.    Allergies (verified) Ace inhibitors    History: Past Medical History:  Diagnosis Date   Breast cancer (HCC)    Cancer (HCC)    right breast cancer   Diabetes mellitus without complication (HCC)    Family history of breast cancer    Hypertension    Personal history of radiation therapy    Sleep apnea    does not use CPAP   Past Surgical History:  Procedure Laterality Date   ABDOMINAL HYSTERECTOMY     BREAST LUMPECTOMY     BREAST LUMPECTOMY WITH RADIOACTIVE SEED LOCALIZATION Right 08/09/2019   Procedure: RIGHT BREAST LUMPECTOMY X 2 WITH RADIOACTIVE SEED LOCALIZATION (3 SEEDS);  Surgeon: Ovidio Kin, MD;  Location: [facility];  Service: General;  Laterality: Right;   Family History  Problem Relation Age of Onset   Hypertension Mother    Heart disease Father    Breast cancer Sister        diagnosed late 22s   Breast cancer Sister        diagnosed 32s   Alcohol abuse Daughter    Social History   Socioeconomic History   Marital status: Married    Spouse name: Not on file   Number of children: Not on file   Years of education: Not on file   Highest education level: Not on file  Occupational History   Not on file  Tobacco Use   Smoking status: Never   Smokeless tobacco: Never  Substance and Sexual Activity   Alcohol use: No   Drug use: No   Sexual activity: Not on file  Other Topics Concern   Not on file  Social History Narrative   f   Social Determinants of Health   Financial Resource Strain: Low Risk  (05/25/2023)   Overall Financial Resource Strain (CARDIA)    Difficulty of Paying Living Expenses: Not very hard  Food Insecurity: No Food Insecurity (05/25/2023)   Hunger Vital Sign    Worried About Running Out of Food in the Last Year: Never true    Ran Out of Food in the Last Year: Never true  Transportation Needs: No Transportation Needs (05/25/2023)   PRAPARE - Administrator, Civil Service (Medical): No    Lack of Transportation (Non-Medical): No  Physical Activity:  Inactive (05/25/2023)   Exercise Vital Sign    Days of Exercise per Week: 0 days    Minutes of Exercise per Session: 0 min  Stress: No Stress Concern Present (05/25/2023)   [employer] of Occupational Health - Occupational Stress Questionnaire    Feeling of Stress : Not at all  Social Connections: Socially Integrated (05/25/2023)   Social Connection and Isolation Panel [NHANES]  Frequency of Communication with Friends and Family: Twice a week    Frequency of Social Gatherings with Friends and Family: More than three times a week    Attends Religious Services: More than 4 times per year    Active Member of [employer] or Organizations: Yes    Attends Engineer, structural: More than 4 times per year    Marital Status: Married    Tobacco Counseling Counseling given: Not Answered   Clinical Intake:  Pre-visit preparation completed: Yes  Pain : No/denies pain     Diabetes: Yes CBG done?: No Did pt. bring in CBG monitor from home?: No  How often do you need to have someone help you when you read instructions, pamphlets, or other written materials from your doctor or pharmacy?: 1 - Never  Interpreter Needed?: No  Information entered by :: Remi Haggard LPN   Activities of Daily Living    05/25/2023   11:36 AM  In your present state of health, do you have any difficulty performing the following activities:  Hearing? 0  Vision? 0  Difficulty concentrating or making decisions? 0  Walking or climbing stairs? 0  Dressing or bathing? 0  Doing errands, shopping? 0  Preparing Food and eating ? N  Using the Toilet? N  In the past six months, have you accidently leaked urine? N  Do you have problems with loss of bowel control? N  Managing your Medications? N  Managing your Finances? N  Housekeeping or managing your Housekeeping? N    Patient Care Team: Shade Flood, MD as PCP - General (Family Medicine) Glenford Peers, OD as Referring Physician  (Optometry) Serena Croissant, MD as Consulting Physician (Hematology and Oncology) Dorothy Puffer, MD as Consulting Physician (Radiation Oncology) Ovidio Kin, MD as Consulting Physician (General Surgery) Erroll Luna, [facility] (Inactive) as Pharmacist (Pharmacist)  Indicate any recent Medical Services you may have received from other than [facility] providers in the past year (date may be approximate).     Assessment:   This is a routine wellness examination for Jenisse.  Hearing/Vision screen Hearing Screening - Comments:: No trouble hearing Vision Screening - Comments:: Cataract removal schedule Up to date [facility]  Dietary issues and exercise activities discussed:     Goals Addressed             This Visit's Progress    Weight (lb) < 200 lb (90.7 kg)         Depression Screen    05/25/2023   11:40 AM 05/03/2023   10:23 AM 02/02/2023   10:18 AM 11/02/2022   10:40 AM 10/12/2022   11:52 AM 08/01/2022   10:40 AM 05/19/2022    9:13 AM  PHQ 2/9 Scores  PHQ - 2 Score 0 2 2 0 0 0 0  PHQ- 9 Score 0 5 4 3 2  0     Fall Risk    05/03/2023   10:24 AM 02/02/2023   10:17 AM 11/02/2022   10:40 AM 10/12/2022   11:52 AM 08/01/2022   10:41 AM  Fall Risk   Falls in the past year? 0 0 0 0 0  Number falls in past yr: 0 0 0 0 0  Injury with Fall? 0 0 0 0 0  Risk for fall due to : No Fall Risks No Fall Risks No Fall Risks No Fall Risks No Fall Risks  Follow up Falls evaluation completed Falls evaluation completed Falls evaluation completed Falls evaluation completed Falls evaluation completed  MEDICARE RISK AT HOME:  Medicare Risk at Home - 05/25/23 1136     Any stairs in or around the home? No    If so, are there any without handrails? No    Home free of loose throw rugs in walkways, pet beds, electrical cords, etc? Yes    Adequate lighting in your home to reduce risk of falls? Yes    Life alert? No    Use of a cane, walker or w/c? No    Grab bars in the bathroom? Yes    Shower  chair or bench in shower? Yes    Elevated toilet seat or a handicapped toilet? No             TIMED UP AND GO:  Was the test performed?  No    Cognitive Function:        05/25/2023   11:37 AM 08/03/2017    8:51 AM  6CIT Screen  What Year? 0 points 0 points  What month? 0 points 0 points  What time? 0 points 0 points  Count back from 20 0 points 0 points  Months in reverse 0 points 0 points  Repeat phrase 0 points 2 points  Total Score 0 points 2 points    Immunizations Immunization History  Administered Date(s) Administered   Covid-19, Mrna,Vaccine(Spikevax)54yrs and older 12/12/2022   DTaP 02/03/2007   Fluad Quad(high Dose 65+) 08/12/2022   Influenza Split 09/17/2012   Influenza, High Dose Seasonal PF 09/22/2018, 09/06/2019   Influenza,inj,Quad PF,6+ Mos 09/30/2013, 07/28/2014, 09/21/2015, 09/22/2016, 08/03/2017   Influenza-Unspecified 08/19/2020, 08/12/2022   PFIZER(Purple Top)SARS-COV-2 Vaccination 01/10/2020, 01/31/2020, 10/03/2020   Pneumococcal Conjugate-13 12/05/2010, 10/27/2014   Pneumococcal Polysaccharide-23 09/21/2015   Zoster Recombinat (Shingrix) 11/03/2022, 12/06/2022   Zoster, Live 03/19/2014    TDAP status: Due, Education has been provided regarding the importance of this vaccine. Advised may receive this vaccine at local pharmacy or Health Dept. Aware to provide a copy of the vaccination record if obtained from local pharmacy or Health Dept. Verbalized acceptance and understanding.  Flu Vaccine status: Up to date  Pneumococcal vaccine status: Up to date  Covid-19 vaccine status: Information provided on how to obtain vaccines.   Qualifies for Shingles Vaccine? No   Zostavax completed Yes   Shingrix Completed?: Yes  Screening Tests Health Maintenance  Topic Date Due   DTaP/Tdap/Td (2 - Tdap) 02/02/2017   COVID-19 Vaccine (5 - 2023-24 season) 06/10/2023 (Originally 02/06/2023)   INFLUENZA VACCINE  07/06/2023   HEMOGLOBIN A1C  11/03/2023    OPHTHALMOLOGY EXAM  02/22/2024   Diabetic kidney evaluation - eGFR measurement  05/02/2024   Diabetic kidney evaluation - Urine ACR  05/02/2024   FOOT EXAM  05/02/2024   Medicare Annual Wellness (AWV)  05/24/2024   Pneumonia Vaccine 75+ Years old  Completed   DEXA SCAN  Completed   Hepatitis C Screening  Completed   Zoster Vaccines- Shingrix  Completed   HPV VACCINES  Aged Out   Colonoscopy  Discontinued    Health Maintenance  Health Maintenance Due  Topic Date Due   DTaP/Tdap/Td (2 - Tdap) 02/02/2017    Colorectal cancer screening: No longer required.   Mammogram status: Completed  . Repeat every year  Bone Density status: Completed 2016. Results reflect: Bone density results: NORMAL. Repeat every 0 years.  Lung Cancer Screening: (Low Dose CT Chest recommended if Age 68-80 years, 20 pack-year currently smoking OR have quit w/in 15years.) does not qualify.   Lung Cancer Screening Referral:  Additional Screening:  Hepatitis C Screening: does not qualify; Completed 2018  Vision Screening: Recommended annual ophthalmology exams for early detection of glaucoma and other disorders of the eye. Is the patient up to date with their annual eye exam?  Yes  Who is the provider or what is the name of the office in which the patient attends annual eye exams? [facility] If pt is not established with a provider, would they like to be referred to a provider to establish care? No .   Dental Screening: Recommended annual dental exams for proper oral hygiene  Diabetic Foot Exam:   Community Resource Referral / Chronic Care Management: CRR required this visit?  No   CCM required this visit?  No     Plan:     I have personally reviewed and noted the following in the patient's chart:   Medical and social history Use of alcohol, tobacco or illicit drugs  Current medications and supplements including opioid prescriptions. Patient is not currently taking opioid  prescriptions. Functional ability and status Nutritional status Physical activity Advanced directives List of other physicians Hospitalizations, surgeries, and ER visits in previous 12 months Vitals Screenings to include cognitive, depression, and falls Referrals and appointments  In addition, I have reviewed and discussed with patient certain preventive protocols, quality metrics, and best practice recommendations. A written personalized care plan for preventive services as well as general preventive health recommendations were provided to patient.     Remi Haggard, LPN   1/61/0960   After Visit Summary: (MyChart) Due to this being a telephonic visit, the after visit summary with patients personalized plan was offered to patient via MyChart   Nurse Notes:

## 2023-05-29 ENCOUNTER — Telehealth: Payer: Self-pay

## 2023-05-29 NOTE — Telephone Encounter (Signed)
Left pt a VM for pt to call office in regards to her Ozempic being delivered to office ,  it is in my fridge in door

## 2023-06-09 ENCOUNTER — Other Ambulatory Visit: Payer: Self-pay | Admitting: Diagnostic Radiology

## 2023-06-09 DIAGNOSIS — Z0000 Encounter for general adult medical examination without abnormal findings: Secondary | ICD-10-CM

## 2023-07-05 ENCOUNTER — Encounter (INDEPENDENT_AMBULATORY_CARE_PROVIDER_SITE_OTHER): Payer: Self-pay

## 2023-07-06 NOTE — Progress Notes (Signed)
Patient Care Team: Shade Flood, MD as PCP - General (Family Medicine) Glenford Peers, OD as Referring Physician (Optometry) Serena Croissant, MD as Consulting Physician (Hematology and Oncology) Dorothy Puffer, MD as Consulting Physician (Radiation Oncology) Ovidio Kin, MD as Consulting Physician (General Surgery) Erroll Luna, [facility] (Inactive) as Pharmacist (Pharmacist)  DIAGNOSIS:  Encounter Diagnosis  Name Primary?   Ductal carcinoma in situ (DCIS) of right breast Yes    SUMMARY OF ONCOLOGIC HISTORY: Oncology History  Ductal carcinoma in situ (DCIS) of right breast  07/04/2019 Initial Diagnosis   Routine screening mammogram detected 1.6cm mass in the right breast at the 9 o'clock position, loosely group calcifications in the upper right breast spanning 3.6cm, and no axillary adenopathy. Biopsy showed intermediate grade DCIS, ER 100%, PR 100%.    07/10/2019 Cancer Staging   Staging form: Breast, AJCC 8th Edition - Clinical stage from 07/10/2019: Stage 0 (cTis (DCIS), cN0, cM0, ER+, PR+, HER2: Not Assessed) - Signed by Serena Croissant, MD on 07/10/2019   08/09/2019 Surgery   Right lumpectomy Ezzard Standing) [id number]): multifocal low grade DCIS spanning 1.4cm and 0.9cm, clear margins.    09/09/2019 - 10/04/2019 Radiation Therapy   The patient initially received a dose of 42.56 Gy in 16 fractions to the breast using whole-breast tangent fields. This was delivered using a 3-D conformal technique. The patient then received a boost to the seroma. This delivered an additional 8 Gy in 26fractions using a 3 field photon technique due to the depth of the seroma. The total dose was 50.56 Gy.   10/2019 - 10/2024 Anti-estrogen oral therapy   Tamoxifen daily     CHIEF COMPLIANT: Follow tamoxifen  INTERVAL HISTORY: Susan Miles is a 77 y.o. with above-mentioned history of right breast DCIS. She presents to the clinic today for a follow-up.  Patients report she is tolerating the tamoxifen  extremely well. She says she is exercising every once in a while.  ALLERGIES:  is allergic to ace inhibitors.  MEDICATIONS:  Current Outpatient Medications  Medication Sig Dispense Refill   ACCU-CHEK AVIVA PLUS test strip USE TO TEST BLOOD SUGAR ONCE DAILY. DX E11.9 100 strip 2   acetaminophen (TYLENOL) 500 MG tablet Take 500 mg by mouth every 6 (six) hours as needed for mild pain.     amLODipine (NORVASC) 10 MG tablet Take 1 tablet (10 mg total) by mouth daily. 90 tablet 1   aspirin 81 MG tablet Take 81 mg by mouth daily.     atorvastatin (LIPITOR) 20 MG tablet TAKE 1 TABLET BY MOUTH DAILY AT 6PM 90 tablet 1   Blood Glucose Monitoring Suppl (LDR BLOOD GLUCOSE TRUETEST) w/Device KIT 1 Device by Does not apply route daily. Test blood sugar once daily. Dx: E11.9 1 kit 0   cloNIDine (CATAPRES) 0.2 MG tablet Take 1 tablet (0.2 mg total) by mouth 2 (two) times daily. 180 tablet 1   dapagliflozin propanediol (FARXIGA) 10 MG TABS tablet Take 1 tablet (10 mg total) by mouth daily before breakfast. 30 tablet 5   fexofenadine (ALLEGRA) 180 MG tablet Take 1 tablet (180 mg total) by mouth daily. 90 tablet 3   fluticasone (FLONASE) 50 MCG/ACT nasal spray PLACE 1-2 SPRAYS INTO BOTH NOSTRILS DAILY. 48 mL 2   Lancets MISC Test blood sugar once daily. Dx: E11.9 100 each 3   losartan (COZAAR) 100 MG tablet Take 1 tablet (100 mg total) by mouth daily. 90 tablet 1   metFORMIN (GLUCOPHAGE) 1000 MG tablet Take 1 tablet (  1,000 mg total) by mouth 2 (two) times daily with a meal. 180 tablet 1   metoprolol (TOPROL-XL) 200 MG 24 hr tablet Take 1 tablet (200 mg total) by mouth daily. 90 tablet 1   sitaGLIPtin (JANUVIA) 100 MG tablet Take 1 tablet (100 mg total) by mouth daily. 90 tablet 1   tamoxifen (NOLVADEX) 20 MG tablet Take 1 tablet (20 mg total) by mouth daily. 90 tablet 3   No current facility-administered medications for this visit.    PHYSICAL EXAMINATION: ECOG PERFORMANCE STATUS: 1 - Symptomatic but  completely ambulatory  Vitals:   07/10/23 1109  BP: 123/71  Pulse: 92  Resp: 18  Temp: (!) 97.5 F (36.4 C)  SpO2: 100%   Filed Weights   07/10/23 1109  Weight: 185 lb (83.9 kg)    BREAST: No palpable masses or nodules in either right or left breasts. No palpable axillary supraclavicular or infraclavicular adenopathy no breast tenderness or nipple discharge. (exam performed in the presence of a chaperone)  LABORATORY DATA:  I have reviewed the data as listed    Latest Ref Rng & Units 05/03/2023   11:19 AM 02/02/2023   10:58 AM 11/02/2022   11:22 AM  CMP  Glucose 70 - 99 mg/dL 161  096  045   BUN 6 - 23 mg/dL 16  18  15    Creatinine 0.40 - 1.20 mg/dL 4.09  8.11  9.14   Sodium 135 - 145 mEq/L 136  138  138   Potassium 3.5 - 5.1 mEq/L 4.1  4.2  4.0   Chloride 96 - 112 mEq/L 104  105  105   CO2 19 - 32 mEq/L 24  25  26    Calcium 8.4 - 10.5 mg/dL 78.2  95.6  21.3   Total Protein 6.0 - 8.3 g/dL 7.0  6.9  7.3   Total Bilirubin 0.2 - 1.2 mg/dL 0.4  0.5  0.4   Alkaline Phos 39 - 117 U/L 45  51  46   AST 0 - 37 U/L 14  14  19    ALT 0 - 35 U/L 11  12  17      Lab Results  Component Value Date   WBC 4.4 04/07/2022   HGB 12.9 04/07/2022   HCT 40.1 04/07/2022   MCV 87.1 04/07/2022   PLT 248.0 04/07/2022   NEUTROABS 3.0 07/10/2019    ASSESSMENT & PLAN:  Ductal carcinoma in situ (DCIS) of right breast 07/04/2019:Routine screening mammogram detected 1.6cm mass in the right breast at the 9 o'clock position, loosely group calcifications in the upper right breast spanning 3.6cm, and no axillary adenopathy. Biopsy showed intermediate grade DCIS, ER 100%, PR 100%.  Tix Nx Stage 0   Treatment summary: 1.  08/09/2019: Right lumpectomy: DCIS with calcifications, low-grade, 1.4 cm, ALH, margins negative, second lumpectomy: DCIS with calcifications, low-grade, 0.9 cm, ER 100%, PR 100%, Tis NX stage 0 2. Adjuvant radiation therapy will be completed 10/04/2019 3.  Adjuvant antiestrogen  therapy with tamoxifen x5 years started 09/30/2019   Tamoxifen toxicities: No side effects   Breast cancer surveillance: 1.  Breast exam 07/10/2023: Benign 2. Mammogram 06/27/2022: Benign breast density category B, next mammogram scheduled for 07/14/2023   Return to clinic in 1 year for follow-up     No orders of the defined types were placed in this encounter.  The patient has a good understanding of the overall plan. she agrees with it. she will call with any problems that may develop before  the next visit here. Total time spent: 30 mins including face to face time and time spent for planning, charting and co-ordination of care   Tamsen Meek, MD 07/10/23    I Janan Ridge am acting as a Neurosurgeon for [employer]  I have reviewed the above documentation for accuracy and completeness, and I agree with the above.

## 2023-07-10 ENCOUNTER — Other Ambulatory Visit: Payer: Self-pay

## 2023-07-10 ENCOUNTER — Inpatient Hospital Stay: Payer: Medicare PPO | Attending: Hematology and Oncology | Admitting: Hematology and Oncology

## 2023-07-10 VITALS — BP 123/71 | HR 92 | Temp 97.5°F | Resp 18 | Ht 64.0 in | Wt 185.0 lb

## 2023-07-10 DIAGNOSIS — Z79810 Long term (current) use of selective estrogen receptor modulators (SERMs): Secondary | ICD-10-CM | POA: Insufficient documentation

## 2023-07-10 DIAGNOSIS — D0511 Intraductal carcinoma in situ of right breast: Secondary | ICD-10-CM | POA: Diagnosis not present

## 2023-07-10 NOTE — Assessment & Plan Note (Signed)
07/04/2019:Routine screening mammogram detected 1.6cm mass in the right breast at the 9 o'clock position, loosely group calcifications in the upper right breast spanning 3.6cm, and no axillary adenopathy. Biopsy showed intermediate grade DCIS, ER 100%, PR 100%.  Tix Nx Stage 0   Treatment summary: 1.  08/09/2019: Right lumpectomy: DCIS with calcifications, low-grade, 1.4 cm, ALH, margins negative, second lumpectomy: DCIS with calcifications, low-grade, 0.9 cm, ER 100%, PR 100%, Tis NX stage 0 2. Adjuvant radiation therapy will be completed 10/04/2019 3.  Adjuvant antiestrogen therapy with tamoxifen x5 years started 09/30/2019   Tamoxifen toxicities: No side effects   Breast cancer surveillance: 1.  Breast exam 07/10/2023: Benign 2. Mammogram 06/27/2022: Benign breast density category B, next mammogram scheduled for 07/14/2023   Return to clinic in 1 year for follow-up

## 2023-07-14 ENCOUNTER — Ambulatory Visit
Admission: RE | Admit: 2023-07-14 | Discharge: 2023-07-14 | Disposition: A | Discharge status: Home / Self Care | Payer: Medicare PPO | Source: Ambulatory Visit | Attending: Diagnostic Radiology | Admitting: Diagnostic Radiology

## 2023-07-14 DIAGNOSIS — Z0000 Encounter for general adult medical examination without abnormal findings: Secondary | ICD-10-CM

## 2023-07-14 DIAGNOSIS — Z1231 Encounter for screening mammogram for malignant neoplasm of breast: Secondary | ICD-10-CM | POA: Diagnosis not present

## 2023-07-15 ENCOUNTER — Other Ambulatory Visit: Payer: Self-pay | Admitting: Hematology and Oncology

## 2023-07-18 ENCOUNTER — Telehealth: Payer: Self-pay | Admitting: Family Medicine

## 2023-07-18 NOTE — Telephone Encounter (Signed)
Received forms from [employer] patient Assistance Printed & placed in provider bin

## 2023-07-18 NOTE — Telephone Encounter (Signed)
Filled out the information I was able, placed in your to be signed folder

## 2023-07-19 DIAGNOSIS — H2512 Age-related nuclear cataract, left eye: Secondary | ICD-10-CM | POA: Diagnosis not present

## 2023-07-19 DIAGNOSIS — H25812 Combined forms of age-related cataract, left eye: Secondary | ICD-10-CM | POA: Diagnosis not present

## 2023-07-19 NOTE — Telephone Encounter (Signed)
On ozempic 0.25mg  for 4 weeks, off due to eye surgery last week. No n/v/abd pain or upset stomach.  Will try to increase dose to 0.5 mg/week.  Indicated on paperwork.  She is scheduled to see me on the 29th but has an eye surgery scheduled day prior, plans to reschedule that visit for a few weeks later.  Paperwork completed and placed in fax bin at back nurse station

## 2023-07-20 DIAGNOSIS — H25011 Cortical age-related cataract, right eye: Secondary | ICD-10-CM | POA: Diagnosis not present

## 2023-07-20 DIAGNOSIS — H25041 Posterior subcapsular polar age-related cataract, right eye: Secondary | ICD-10-CM | POA: Diagnosis not present

## 2023-07-20 DIAGNOSIS — H2511 Age-related nuclear cataract, right eye: Secondary | ICD-10-CM | POA: Diagnosis not present

## 2023-07-20 NOTE — Telephone Encounter (Signed)
Faxed back to the requested number from [employer]

## 2023-07-22 ENCOUNTER — Other Ambulatory Visit: Payer: Self-pay | Admitting: Family Medicine

## 2023-08-03 ENCOUNTER — Ambulatory Visit: Payer: Medicare Other | Admitting: Family Medicine

## 2023-08-05 ENCOUNTER — Other Ambulatory Visit: Payer: Self-pay | Admitting: Family Medicine

## 2023-08-05 DIAGNOSIS — I10 Essential (primary) hypertension: Secondary | ICD-10-CM

## 2023-08-29 ENCOUNTER — Telehealth: Payer: Self-pay

## 2023-08-29 NOTE — Telephone Encounter (Signed)
Called and spoke with patient about medication pick up, there has been an order received of 5 boxes of Ozempic 0.25/0.5 placed in the refrigerator on A side   Patient states she will come by to pick this up

## 2023-08-30 DIAGNOSIS — H2511 Age-related nuclear cataract, right eye: Secondary | ICD-10-CM | POA: Diagnosis not present

## 2023-09-19 DIAGNOSIS — Z961 Presence of intraocular lens: Secondary | ICD-10-CM | POA: Diagnosis not present

## 2023-09-19 DIAGNOSIS — H52223 Regular astigmatism, bilateral: Secondary | ICD-10-CM | POA: Diagnosis not present

## 2023-10-18 ENCOUNTER — Other Ambulatory Visit: Payer: Self-pay | Admitting: Family Medicine

## 2023-10-18 DIAGNOSIS — E1129 Type 2 diabetes mellitus with other diabetic kidney complication: Secondary | ICD-10-CM

## 2023-10-25 ENCOUNTER — Encounter: Payer: Self-pay | Admitting: Family Medicine

## 2023-10-25 ENCOUNTER — Ambulatory Visit: Payer: Medicare PPO | Admitting: Family Medicine

## 2023-10-25 VITALS — BP 128/80 | HR 82 | Temp 98.0°F | Ht 64.0 in | Wt 180.2 lb

## 2023-10-25 DIAGNOSIS — R809 Proteinuria, unspecified: Secondary | ICD-10-CM

## 2023-10-25 DIAGNOSIS — E1129 Type 2 diabetes mellitus with other diabetic kidney complication: Secondary | ICD-10-CM | POA: Diagnosis not present

## 2023-10-25 DIAGNOSIS — E782 Mixed hyperlipidemia: Secondary | ICD-10-CM

## 2023-10-25 DIAGNOSIS — Z7985 Long-term (current) use of injectable non-insulin antidiabetic drugs: Secondary | ICD-10-CM

## 2023-10-25 DIAGNOSIS — I10 Essential (primary) hypertension: Secondary | ICD-10-CM

## 2023-10-25 DIAGNOSIS — Z7984 Long term (current) use of oral hypoglycemic drugs: Secondary | ICD-10-CM

## 2023-10-25 LAB — LIPID PANEL
Cholesterol: 108 mg/dL (ref 0–200)
HDL: 33.8 mg/dL — ABNORMAL LOW (ref >39.00)
LDL Cholesterol: 56 mg/dL (ref 0–99)
NonHDL: 73.75
Total CHOL/HDL Ratio: 3
Triglycerides: 90 mg/dL (ref 0.0–149.0)
VLDL: 18 mg/dL (ref 0.0–40.0)

## 2023-10-25 LAB — COMPREHENSIVE METABOLIC PANEL
ALT: 9 U/L (ref 0–35)
AST: 12 U/L (ref 0–37)
Albumin: 3.8 g/dL (ref 3.5–5.2)
Alkaline Phosphatase: 41 U/L (ref 39–117)
BUN: 15 mg/dL (ref 6–23)
CO2: 26 meq/L (ref 19–32)
Calcium: 10.1 mg/dL (ref 8.4–10.5)
Chloride: 104 meq/L (ref 96–112)
Creatinine, Ser: 1.08 mg/dL (ref 0.40–1.20)
GFR: 49.76 mL/min — ABNORMAL LOW (ref >60.00)
Glucose, Bld: 115 mg/dL — ABNORMAL HIGH (ref 70–99)
Potassium: 4.3 meq/L (ref 3.5–5.1)
Sodium: 135 meq/L (ref 135–145)
Total Bilirubin: 0.6 mg/dL (ref 0.2–1.2)
Total Protein: 6.8 g/dL (ref 6.0–8.3)

## 2023-10-25 LAB — HEMOGLOBIN A1C: Hgb A1c MFr Bld: 7.1 % — ABNORMAL HIGH (ref 4.6–6.5)

## 2023-10-25 MED ORDER — ATORVASTATIN CALCIUM 20 MG PO TABS: ORAL_TABLET | ORAL | 1 refills | Status: DC

## 2023-10-25 MED ORDER — AMLODIPINE BESYLATE 10 MG PO TABS: 10.0000 mg | ORAL_TABLET | Freq: Every day | ORAL | 1 refills | Status: DC

## 2023-10-25 MED ORDER — METOPROLOL SUCCINATE ER 200 MG PO TB24: 200.0000 mg | ORAL_TABLET | Freq: Every day | ORAL | 1 refills | Status: DC

## 2023-10-25 MED ORDER — LOSARTAN POTASSIUM 100 MG PO TABS: 100.0000 mg | ORAL_TABLET | Freq: Every day | ORAL | 1 refills | Status: DC

## 2023-10-25 MED ORDER — DAPAGLIFLOZIN PROPANEDIOL 10 MG PO TABS: 10.0000 mg | ORAL_TABLET | Freq: Every day | ORAL | 5 refills | Status: DC

## 2023-10-25 MED ORDER — CLONIDINE HCL 0.2 MG PO TABS: 0.2000 mg | ORAL_TABLET | Freq: Two times a day (BID) | ORAL | 1 refills | Status: DC

## 2023-10-25 NOTE — Patient Instructions (Addendum)
Now that you are on ozempic - can stop Januvia - they work in a similar manner. We may need to increase dose of Ozempic. Depending on A1c and if you notice higher readings at home - let me know if that occurs. Continue fiber in diet with vegetables, some fruits. Supplement like metamucil.  If you are constipated, Miralax over the counter can be used.   No other med changes for now. I will refer you to new pharmacist to continue to assist with meds.   Take care!

## 2023-10-25 NOTE — Progress Notes (Signed)
Subjective:  Patient ID: Susan Miles, female    DOB: 06/30/1946  Age: 77 y.o. MRN: [id number]  CC:  Chief Complaint  Patient presents with   Medical Management of Chronic Issues    Pt is doing well no questions or concerns     HPI Susan Miles presents for   DCIS Diagnosed in 2020, treated with lumpectomy, radiation and now on tamoxifen for 5 years - no side effects, 5 year treatment planned. followed by oncology, Dr. Pamelia Hoit with last appointment August 5.  Mammogram August 9, no evidence of malignancy, repeat 1 year, follow-up with oncology in 1 year.  Diabetes: Complicated by hyperglycemia, microalbuminuria, CKD.  Followed by pharmacist previously.  Has been on medication assistance for [place], Alma Friendly.  Treated with metformin 1000 mg twice daily, and glipizide previously that was discontinued.  She is on statin with Lipitor, losartan for ARB.  Plan for starting GLP-1 given consistent elevated readings at last visit. No uti or mycotic infection symptoms.  On ozempic 0.5mg  weekly, no n/v/abd pain or other side effects. Slower BM after injections. Some hard stools.  Home readings fasting: 97 - low 100's.   Postprandial:none Symptomatic lows - none.  Microalbumin: 2.4 on 05/03/2023. Optho, foot exam, pneumovax: Up-to-date  Lab Results  Component Value Date   HGBA1C 8.5 (H) 05/03/2023   HGBA1C 9.0 (H) 02/02/2023   HGBA1C 8.3 (H) 11/02/2022   Lab Results  Component Value Date   MICROALBUR 2.4 (H) 05/03/2023   LDLCALC 54 05/03/2023   CREATININE 1.09 05/03/2023   Hypertension: Treated with amlodipine, losartan, Toprol, clonidine.  History of chronic peripheral edema versus lymphedema managed by avoiding sodium.  Denies any med side effects. No recent swelling.  Home readings: 116-120/80.  BP Readings from Last 3 Encounters:  10/25/23 128/80  07/10/23 123/71  05/03/23 126/74   Lab Results  Component Value Date   CREATININE 1.09 05/03/2023    Hyperlipidemia: Lipitor 20 mg daily. No new myalgias/side effects.  Lab Results  Component Value Date   CHOL 111 05/03/2023   HDL 39.90 05/03/2023   LDLCALC 54 05/03/2023   TRIG 82.0 05/03/2023   CHOLHDL 3 05/03/2023   Lab Results  Component Value Date   ALT 11 05/03/2023   AST 14 05/03/2023   ALKPHOS 45 05/03/2023   BILITOT 0.4 05/03/2023        History Patient Active Problem List   Diagnosis Date Noted   Cellulitis and abscess of trunk 07/29/2022   Family history of breast cancer    Ductal carcinoma in situ (DCIS) of right breast 07/04/2019   Hyperlipidemia 12/21/2017   Breast asymmetry 05/01/2015   Cataract, nuclear 05/24/2012   Benign hypertension 02/10/2012   Type 2 diabetes mellitus without complications (HCC) 02/10/2012   Past Medical History:  Diagnosis Date   Breast cancer (HCC)    Cancer (HCC)    right breast cancer   Diabetes mellitus without complication (HCC)    Family history of breast cancer    Hypertension    Personal history of radiation therapy    Sleep apnea    does not use CPAP   Past Surgical History:  Procedure Laterality Date   ABDOMINAL HYSTERECTOMY     BREAST LUMPECTOMY     BREAST LUMPECTOMY WITH RADIOACTIVE SEED LOCALIZATION Right 08/09/2019   Procedure: RIGHT BREAST LUMPECTOMY X 2 WITH RADIOACTIVE SEED LOCALIZATION (3 SEEDS);  Surgeon: Ovidio Kin, MD;  Location: [facility];  Service: General;  Laterality: Right;  Allergies  Allergen Reactions   Ace Inhibitors Cough   Prior to Admission medications   Medication Sig Start Date End Date Taking? Authorizing Provider  ACCU-CHEK AVIVA PLUS test strip USE TO TEST BLOOD SUGAR ONCE DAILY. DX E11.9 05/25/22  Yes Shade Flood, MD  acetaminophen (TYLENOL) 500 MG tablet Take 500 mg by mouth every 6 (six) hours as needed for mild pain.   Yes [provider]  amLODipine (NORVASC) 10 MG tablet Take 1 tablet (10 mg total) by mouth daily. 05/03/23  Yes Shade Flood, MD  aspirin 81 MG tablet Take 81 mg by mouth daily.   Yes [provider]  atorvastatin (LIPITOR) 20 MG tablet TAKE 1 TABLET BY MOUTH DAILY AT 6PM 05/03/23  Yes Shade Flood, MD  Blood Glucose Monitoring Suppl (LDR BLOOD GLUCOSE TRUETEST) w/Device KIT 1 Device by Does not apply route daily. Test blood sugar once daily. Dx: E11.9 05/13/20  Yes Shade Flood, MD  cloNIDine (CATAPRES) 0.2 MG tablet Take 1 tablet (0.2 mg total) by mouth 2 (two) times daily. 05/03/23  Yes Shade Flood, MD  dapagliflozin propanediol (FARXIGA) 10 MG TABS tablet Take 1 tablet (10 mg total) by mouth daily before breakfast. 05/03/23  Yes Shade Flood, MD  fexofenadine (ALLEGRA) 180 MG tablet Take 1 tablet (180 mg total) by mouth daily. 11/02/22  Yes Shade Flood, MD  fluticasone (FLONASE) 50 MCG/ACT nasal spray PLACE 1-2 SPRAYS INTO BOTH NOSTRILS DAILY. 05/15/23  Yes Shade Flood, MD  Lancets MISC Test blood sugar once daily. Dx: E11.9 01/24/18  Yes Shade Flood, MD  losartan (COZAAR) 100 MG tablet TAKE 1 TABLET BY MOUTH EVERY DAY 08/06/23  Yes Shade Flood, MD  metFORMIN (GLUCOPHAGE) 1000 MG tablet TAKE 1 TABLET (1,000 MG TOTAL) BY MOUTH TWICE A DAY WITH FOOD 10/18/23  Yes Shade Flood, MD  metoprolol (TOPROL-XL) 200 MG 24 hr tablet TAKE 1 TABLET BY MOUTH EVERY DAY 08/06/23  Yes Shade Flood, MD  sitaGLIPtin (JANUVIA) 100 MG tablet Take 1 tablet (100 mg total) by mouth daily. 05/03/23  Yes Shade Flood, MD  tamoxifen (NOLVADEX) 20 MG tablet TAKE 1 TABLET BY MOUTH EVERY DAY 07/17/23  Yes Serena Croissant, MD   Social History   Socioeconomic History   Marital status: Married    Spouse name: Not on file   Number of children: Not on file   Years of education: Not on file   Highest education level: Not on file  Occupational History   Not on file  Tobacco Use   Smoking status: Never   Smokeless tobacco: Never  Substance and Sexual Activity   Alcohol use: No    Drug use: No   Sexual activity: Not on file  Other Topics Concern   Not on file  Social History Narrative   f   Social Determinants of Health   Financial Resource Strain: Low Risk  (05/25/2023)   Overall Financial Resource Strain (CARDIA)    Difficulty of Paying Living Expenses: Not very hard  Food Insecurity: No Food Insecurity (05/25/2023)   Hunger Vital Sign    Worried About Running Out of Food in the Last Year: Never true    Ran Out of Food in the Last Year: Never true  Transportation Needs: No Transportation Needs (05/25/2023)   PRAPARE - Administrator, Civil Service (Medical): No    Lack of Transportation (Non-Medical): No  Physical Activity: Inactive (05/25/2023)  Exercise Vital Sign    Days of Exercise per Week: 0 days    Minutes of Exercise per Session: 0 min  Stress: No Stress Concern Present (05/25/2023)   [employer] of Occupational Health - Occupational Stress Questionnaire    Feeling of Stress : Not at all  Social Connections: Socially Integrated (05/25/2023)   Social Connection and Isolation Panel [NHANES]    Frequency of Communication with Friends and Family: Twice a week    Frequency of Social Gatherings with Friends and Family: More than three times a week    Attends Religious Services: More than 4 times per year    Active Member of [employer] or Organizations: Yes    Attends Engineer, structural: More than 4 times per year    Marital Status: Married  Catering manager Violence: Not At Risk (05/25/2023)   Humiliation, Afraid, Rape, and Kick questionnaire    Fear of Current or Ex-Partner: No    Emotionally Abused: No    Physically Abused: No    Sexually Abused: No    Review of Systems  Constitutional:  Negative for fatigue and unexpected weight change.  Respiratory:  Negative for chest tightness and shortness of breath.   Cardiovascular:  Negative for chest pain, palpitations and leg swelling.  Gastrointestinal:  Negative for abdominal  pain and blood in stool.  Neurological:  Negative for dizziness, syncope, light-headedness and headaches.     Objective:   Vitals:   10/25/23 1027  BP: 128/80  Pulse: 82  Temp: 98 F (36.7 C)  TempSrc: Temporal  SpO2: 98%  Weight: 180 lb 3.2 oz (81.7 kg)  Height: 5\' 4"  (1.626 m)     Physical Exam Vitals reviewed.  Constitutional:      Appearance: Normal appearance. She is well-developed.  HENT:     Head: Normocephalic and atraumatic.  Eyes:     Conjunctiva/sclera: Conjunctivae normal.     Pupils: Pupils are equal, round, and reactive to light.  Neck:     Vascular: No carotid bruit.  Cardiovascular:     Rate and Rhythm: Normal rate and regular rhythm.     Heart sounds: Normal heart sounds.  Pulmonary:     Effort: Pulmonary effort is normal.     Breath sounds: Normal breath sounds.  Abdominal:     Palpations: Abdomen is soft. There is no pulsatile mass.     Tenderness: There is no abdominal tenderness.  Musculoskeletal:     Right lower leg: No edema.     Left lower leg: No edema.  Skin:    General: Skin is warm and dry.  Neurological:     Mental Status: She is alert and oriented to person, place, and time.  Psychiatric:        Mood and Affect: Mood normal.        Behavior: Behavior normal.      Assessment & Plan:  Susan Miles is a 77 y.o. female . Type 2 diabetes mellitus with microalbuminuria, without long-term current use of insulin (HCC) - Plan: AMB Referral [facility] Care Management, Hemoglobin A1c, dapagliflozin propanediol (FARXIGA) 10 MG TABS tablet  -Now on Ozempic with significantly improved readings at home.  Anticipate significant improved A1c.  Will stop Januvia as she is now on GLP-1.  Depending on A1c may need to increase Ozempic dosing, or if elevated readings off Januvia.  Continue Farxiga, metformin same doses for now.  Refer to pharmacy as she is on medication assistance programs.  Appreciate their assistance.  37-month follow-up.  Essential  hypertension - Plan: Comprehensive metabolic panel, amLODipine (NORVASC) 10 MG tablet, cloNIDine (CATAPRES) 0.2 MG tablet, losartan (COZAAR) 100 MG tablet, metoprolol (TOPROL-XL) 200 MG 24 hr tablet  -Stable, no med changes, check labs and adjust plan accordingly.  Mixed hyperlipidemia - Plan: Lipid panel, atorvastatin (LIPITOR) 20 MG tablet  -Tolerating current regimen, continue same, check labs as above and adjust plan accordingly.  Meds ordered this encounter  Medications   amLODipine (NORVASC) 10 MG tablet    Sig: Take 1 tablet (10 mg total) by mouth daily.    Dispense:  90 tablet    Refill:  1   atorvastatin (LIPITOR) 20 MG tablet    Sig: TAKE 1 TABLET BY MOUTH DAILY AT 6PM    Dispense:  90 tablet    Refill:  1   cloNIDine (CATAPRES) 0.2 MG tablet    Sig: Take 1 tablet (0.2 mg total) by mouth 2 (two) times daily.    Dispense:  180 tablet    Refill:  1   dapagliflozin propanediol (FARXIGA) 10 MG TABS tablet    Sig: Take 1 tablet (10 mg total) by mouth daily before breakfast.    Dispense:  30 tablet    Refill:  5   losartan (COZAAR) 100 MG tablet    Sig: Take 1 tablet (100 mg total) by mouth daily.    Dispense:  90 tablet    Refill:  1   metoprolol (TOPROL-XL) 200 MG 24 hr tablet    Sig: Take 1 tablet (200 mg total) by mouth daily.    Dispense:  90 tablet    Refill:  1   Patient Instructions  Now that you are on ozempic - can stop Januvia - they work in a similar manner. We may need to increase dose of Ozempic. Depending on A1c and if you notice higher readings at home - let me know if that occurs. Continue fiber in diet with vegetables, some fruits. Supplement like metamucil.  If you are constipated, Miralax over the counter can be used.   No other med changes for now. I will refer you to new pharmacist to continue to assist with meds.   Take care!     Signed,   Meredith Staggers, MD [facility], [facility] 10/25/23 11:10  AM

## 2023-10-26 ENCOUNTER — Telehealth: Payer: Self-pay

## 2023-10-26 NOTE — Progress Notes (Signed)
   Care Guide Note  10/26/2023 Name: Susan Miles MRN: [id number] DOB: 11-13-1946  Referred by: Shade Flood, MD Reason for referral : Care Coordination (Outreach to schedule with Pharm d )   Susan Miles is a 77 y.o. year old female who is a primary care patient of Neva Seat, Asencion Partridge, MD. Susan Miles was referred to the pharmacist for assistance related to DM.    An unsuccessful telephone outreach was attempted today to contact the patient who was referred to the pharmacy team for assistance with medication assistance. Additional attempts will be made to contact the patient.   Penne Lash , RMA     [facility]  [facility], [facility] Guide  Direct Dial: [phone]  Website: [email or web address]

## 2023-10-30 NOTE — Progress Notes (Signed)
   Care Guide Note  10/30/2023 Name: Susan Miles MRN: [id number] DOB: August 06, 1946  Referred by: Shade Flood, MD Reason for referral : Care Coordination (Outreach to schedule with Pharm d )   Susan Miles is a 77 y.o. year old female who is a primary care patient of Neva Seat, Asencion Partridge, MD. Susan Miles was referred to the pharmacist for assistance related to DM.    A second unsuccessful telephone outreach was attempted today to contact the patient who was referred to the pharmacy team for assistance with medication management. Additional attempts will be made to contact the patient.  Penne Lash , RMA     [facility]  [facility], [facility] Guide  Direct Dial: [phone]  Website: [email or web address]

## 2023-11-03 NOTE — Progress Notes (Signed)
   Care Guide Note  11/03/2023 Name: Susan Miles MRN: [id number] DOB: 09-26-1946  Referred by: Shade Flood, MD Reason for referral : Care Coordination (Outreach to schedule with Pharm d )   Susan Miles is a 77 y.o. year old female who is a primary care patient of Neva Seat, Asencion Partridge, MD. Astyn Britten was referred to the pharmacist for assistance related to DM.    Successful contact was made with the patient to discuss pharmacy services including being ready for the pharmacist to call at least 5 minutes before the scheduled appointment time, to have medication bottles and any blood sugar or blood pressure readings ready for review. The patient agreed to meet with the pharmacist via with the pharmacist via telephone visit on (date/time).  11/14/2023  Penne Lash , RMA     [facility]  [facility], [facility] Guide  Direct Dial: [phone]  Website: [email or web address]

## 2023-11-13 ENCOUNTER — Other Ambulatory Visit: Payer: Self-pay | Admitting: Family Medicine

## 2023-11-13 DIAGNOSIS — J309 Allergic rhinitis, unspecified: Secondary | ICD-10-CM

## 2023-11-14 ENCOUNTER — Other Ambulatory Visit: Payer: Self-pay | Admitting: Pharmacist

## 2023-11-14 ENCOUNTER — Encounter: Payer: Self-pay | Admitting: Pharmacist

## 2023-11-14 DIAGNOSIS — E1129 Type 2 diabetes mellitus with other diabetic kidney complication: Secondary | ICD-10-CM

## 2023-11-14 MED ORDER — DAPAGLIFLOZIN PROPANEDIOL 10 MG PO TABS: 10.0000 mg | ORAL_TABLET | Freq: Every day | ORAL | 1 refills | Status: DC

## 2023-11-14 NOTE — Progress Notes (Signed)
   11/14/2023 Name: Susan Miles MRN: [id number] DOB: 11/29/1946  Chief Complaint  Patient presents with   Diabetes   Medication Management    Corin Doddie Doke is a 77 y.o. year old female who presented for a telephone visit.   They were referred to the pharmacist by their PCP for assistance in managing diabetes and medication access.    Subjective: Patient was enrolled in medication assistance program for Ozempic with [employer] and [place] with [place] and [place] in 2024. She is due to re-enroll in both of these programs.   Medication Access/Adherence  Current Pharmacy:  [id number] [place], [place] - [address] [place] [address] Phone: [phone] Fax: [phone]  [facility] - [place], [place] - [address] [place] [address] Phone: [phone] Fax: [phone]   Patient reports affordability concerns with their medications: Yes  Patient reports access/transportation concerns to their pharmacy: No  Patient reports adherence concerns with their medications:  No     Patient checks blood glucose at home once daily.  Lowest blood glucose per patient was 97  Highest blood glucose per patient was 150  She denies nausea with Ozempic. She does have some constipation but reports it is only mild and occasional.   Objective:  Lab Results  Component Value Date   HGBA1C 7.1 (H) 10/25/2023    Lab Results  Component Value Date   CREATININE 1.08 10/25/2023   BUN 15 10/25/2023   NA 135 10/25/2023   K 4.3 10/25/2023   CL 104 10/25/2023   CO2 26 10/25/2023    Lab Results  Component Value Date   CHOL 108 10/25/2023   HDL 33.80 (L) 10/25/2023   LDLCALC 56 10/25/2023   TRIG 90.0 10/25/2023   CHOLHDL 3 10/25/2023    Medications Reviewed Today   Medications were not reviewed in this encounter       Assessment/Plan:   Medication Management / Access: - Reviewed medication list and refill  history with patient.  - Assisted patient with on-line application for [employer] / Ozempic application. Both the patient and provider portion of the [employer] medication assistance program application for 2025 has been submitted.  - Unable to complete re-enrollment for Farxiga / [place] on line. Called [place] with patient today and we were able to verify that she is now re-enrolled thru 12/04/2024. [place] and [place] representative mentioned that an updated RX for Farxiga 10mg  for 90 DS is needed - can fax to [phone] or send electronically to [facility]. Will request from Dr Neva Seat.    Follow Up Plan: Jan 2025 to confirm initial shipments for Ozempic and Farxiga for 2025  Henrene Pastor, PharmD Clinical Pharmacist [facility] Primary [facility]  Population Health [phone]

## 2023-11-14 NOTE — Telephone Encounter (Signed)
Patient has been approved to receive Farxiga 10mg  thru 12/04/2024 from [facility] and Me medication assistance program. The program has requested an updated Rx for 90 DS to be sent electronically to [facility] or by fax [phone] to [facility] and Me program.  Forwarding request to Dr Amanda Cockayne for review and approval if appropriate.

## 2023-11-15 ENCOUNTER — Telehealth: Payer: Self-pay | Admitting: Family Medicine

## 2023-11-15 NOTE — Telephone Encounter (Signed)
Placed in folder at nurse station

## 2023-11-15 NOTE — Telephone Encounter (Signed)
Type of form received: Assistance Program Application  Additional comments: [employer]  Received by: [employer]  Form should be Faxed/mailed to: (address/ fax #) [phone]  Is patient requesting call for pickup: No  Form placed:  Labeled & placed in provider bin  Attach charge sheet.  Provider will determine charge.  Individual made aware of 3-5 business day turn around? N/A

## 2023-11-15 NOTE — Telephone Encounter (Signed)
I am happy to look at form but I will be out of the office until Monday.  If that needs to be completed prior, please have Dr. Beverely Low assist.  Thank you

## 2023-11-23 ENCOUNTER — Encounter: Payer: Self-pay | Admitting: Pharmacist

## 2023-11-23 NOTE — Progress Notes (Signed)
11/23/2023 Name: Susan Miles MRN: [id number] DOB: 1946/11/18  Chief Complaint  Patient presents with   Medication Management    Ozempic    Susan Miles is a 77 y.o. year old female who presented for a telephone visit.   They were referred to the pharmacist by their PCP for assistance in managing diabetes and medication access.    Subjective:  Medication Access/Adherence  Patient was enrolled in medication assistance program for Ozempic with [employer] and [place] with [place] and [place] in 2024. We have received confirmation that she is now approved to receive Farxiga thru 12/04/2024.   I called [employer] to check on approved for 2025 - they are requiring additional information.    Current Pharmacy:  [id number] [place], [place] - [address] [place] [address] Phone: [phone] Fax: [phone]  [facility] - [place], [place] - [address] [place] [address] [place] [address] Phone: [phone] Fax: [phone]   Patient reports affordability concerns with their medications: Yes  Patient reports access/transportation concerns to their pharmacy: No  Patient reports adherence concerns with their medications:  No      Objective:  Lab Results  Component Value Date   HGBA1C 7.1 (H) 10/25/2023    Lab Results  Component Value Date   CREATININE 1.08 10/25/2023   BUN 15 10/25/2023   NA 135 10/25/2023   K 4.3 10/25/2023   CL 104 10/25/2023   CO2 26 10/25/2023    Lab Results  Component Value Date   CHOL 108 10/25/2023   HDL 33.80 (L) 10/25/2023   LDLCALC 56 10/25/2023   TRIG 90.0 10/25/2023   CHOLHDL 3 10/25/2023    Medications Reviewed Today     Reviewed by Henrene Pastor, RPH-CPP (Pharmacist) on 11/23/23 at 0931  Med List Status: <None>   Medication Order Taking? Sig Documenting Provider Last Dose Status Informant  ACCU-CHEK AVIVA PLUS test strip [id number] No USE TO TEST BLOOD SUGAR ONCE DAILY.  DX E11.9 Shade Flood, MD Taking Active   acetaminophen (TYLENOL) 500 MG tablet [id number] No Take 500 mg by mouth every 6 (six) hours as needed for mild pain. [provider] Taking Active Self  amLODipine (NORVASC) 10 MG tablet [id number] No Take 1 tablet (10 mg total) by mouth daily. Shade Flood, MD Taking Active   aspirin 81 MG tablet [id number] No Take 81 mg by mouth daily. [provider] Taking Active Self  atorvastatin (LIPITOR) 20 MG tablet [id number] No TAKE 1 TABLET BY MOUTH DAILY AT Maude Leriche, MD Taking Active   Blood Glucose Monitoring Suppl (LDR BLOOD GLUCOSE TRUETEST) w/Device KIT [id number] No 1 Device by Does not apply route daily. Test blood sugar once daily. Dx: E11.9 Shade Flood, MD Taking Active   cloNIDine (CATAPRES) 0.2 MG tablet [id number] No Take 1 tablet (0.2 mg total) by mouth 2 (two) times daily. Shade Flood, MD Taking Active   dapagliflozin propanediol (FARXIGA) 10 MG TABS tablet [id number]  Take 1 tablet (10 mg total) by mouth daily before breakfast. [place] and [place] Medication Assistance Program 2025 Shade Flood, MD  Active   fexofenadine [facility]) 180 MG tablet [id number] No TAKE 1 TABLET BY MOUTH EVERY DAY Shade Flood, MD Taking Active   fluticasone (FLONASE) 50 MCG/ACT nasal spray [id number] No PLACE 1-2 SPRAYS INTO BOTH NOSTRILS DAILY. Shade Flood, MD Taking Active   Lancets MISC [id number] No  Test blood sugar once daily. Dx: E11.9 Shade Flood, MD Taking Active   losartan (COZAAR) 100 MG tablet [id number] No Take 1 tablet (100 mg total) by mouth daily. Shade Flood, MD Taking Active   metFORMIN (GLUCOPHAGE) 1000 MG tablet [id number] No TAKE 1 TABLET (1,000 MG TOTAL) BY MOUTH TWICE A DAY WITH FOOD Shade Flood, MD Taking Active   metoprolol (TOPROL-XL) 200 MG 24 hr tablet [id number] No Take 1 tablet (200 mg total) by mouth daily. Shade Flood, MD Taking Active   Semaglutide,0.25 or 0.5MG /DOS,  (OZEMPIC, 0.25 OR 0.5 MG/DOSE,) 2 MG/3ML SOPN [id number] No Inject 0.5 mg into the skin once a week. [provider] Taking Active   tamoxifen (NOLVADEX) 20 MG tablet [id number] No TAKE 1 TABLET BY MOUTH EVERY DAY Serena Croissant, MD Taking Active               Assessment/Plan:   Medication Management / Access: - Reviewed medication list and refill history - Coordinated with [employer] to provided needed information to complete her 2025 Ozempic medication assistance program application. [employer] representative was able to approve application fro 2025 while on the phone. Approved thru 12/04/2024.  Follow Up Plan: Jan 2025 to confirm initial shipments for Ozempic and Farxiga for 2025  Henrene Pastor, PharmD Clinical Pharmacist [facility] Primary Care  Population Health [phone]

## 2023-11-24 ENCOUNTER — Other Ambulatory Visit: Payer: Self-pay

## 2023-11-24 MED ORDER — ACCU-CHEK AVIVA PLUS VI STRP: ORAL_STRIP | 2 refills | Status: DC

## 2023-12-28 ENCOUNTER — Telehealth: Payer: Self-pay

## 2023-12-28 ENCOUNTER — Encounter: Payer: Self-pay | Admitting: Pharmacist

## 2023-12-28 ENCOUNTER — Other Ambulatory Visit: Payer: Self-pay | Admitting: Pharmacist

## 2023-12-28 NOTE — Telephone Encounter (Signed)
Received shipment of the patients Ozempic to the office,  Called patient to inform her this can be picked up, no answer, unable to leave a VM call disconnected. Will try call again later 4 boxes placed in shipping bag in the Fridge in POD B

## 2023-12-28 NOTE — Progress Notes (Signed)
12/28/2023 Name: Susan Miles MRN: [id number] DOB: 23-May-1946  Chief Complaint  Patient presents with   Medication Management   Diabetes    Susan Miles is a 78 y.o. year old female who presented for a telephone visit.   They were referred to the pharmacist by their PCP for assistance in managing diabetes and medication access.    Subjective:  Medication Access/Adherence  Patient was enrolled in medication assistance program for Ozempic with [employer] and [place] with [place] and [place] in 2024. We have received confirmation that she is now approved to receive Farxiga thru 12/04/2024.   She has also been approved to receive Ozempic from [employer] medication assistance program thru 12/04/2024.  Our office received first delivery of Ozempic today.   Current Pharmacy:  [id number] [place], [place] - [address] [place] [address] Phone: [phone] Fax: [phone]  [facility] - [place], [place] - [address] [place] [address] Phone: [phone] Fax: [phone]   Patient reports affordability concerns with their medications: Yes  Patient reports access/transportation concerns to their pharmacy: No  Patient reports adherence concerns with their medications:  No      Diabetes:  Current medications: Ozempic 0.5mg  weekly; metformin 1000mg  twice day; Farxiga 10mg  daily   Current glucose readings: 112 to 115 usually; lowest 89, 90   Hypertension:  Current medications: clonidine 0.2mg  twice a day, amlodipine 10mg  daily, metoprolol XL 200mg  daily, losartan 100mg  daily   Patient states that she was out of her metoprolol for 2 or 3 days but she has requested refill and will pick up today.    Objective:  Lab Results  Component Value Date   HGBA1C 7.1 (H) 10/25/2023    Lab Results  Component Value Date   CREATININE 1.08 10/25/2023   BUN 15 10/25/2023   NA 135 10/25/2023   K 4.3 10/25/2023    CL 104 10/25/2023   CO2 26 10/25/2023    Lab Results  Component Value Date   CHOL 108 10/25/2023   HDL 33.80 (L) 10/25/2023   LDLCALC 56 10/25/2023   TRIG 90.0 10/25/2023   CHOLHDL 3 10/25/2023    Medications Reviewed Today     Reviewed by Henrene Pastor, RPH-CPP (Pharmacist) on 12/28/23 at 1154  Med List Status: <None>   Medication Order Taking? Sig Documenting Provider Last Dose Status Informant  acetaminophen (TYLENOL) 500 MG tablet [id number] Yes Take 500 mg by mouth every 6 (six) hours as needed for mild pain. [provider] Taking Active Self  amLODipine (NORVASC) 10 MG tablet [id number] Yes Take 1 tablet (10 mg total) by mouth daily. Shade Flood, MD Taking Active   aspirin 81 MG tablet [id number] Yes Take 81 mg by mouth daily. [provider] Taking Active Self  atorvastatin (LIPITOR) 20 MG tablet [id number] Yes TAKE 1 TABLET BY MOUTH DAILY AT Maude Leriche, MD Taking Active   Blood Glucose Monitoring Suppl (LDR BLOOD GLUCOSE TRUETEST) w/Device KIT [id number] Yes 1 Device by Does not apply route daily. Test blood sugar once daily. Dx: E11.9 Shade Flood, MD Taking Active   cloNIDine (CATAPRES) 0.2 MG tablet [id number] Yes Take 1 tablet (0.2 mg total) by mouth 2 (two) times daily. Shade Flood, MD Taking Active   dapagliflozin propanediol (FARXIGA) 10 MG TABS tablet [id number] Yes Take 1 tablet (10 mg total) by mouth daily before breakfast. [place] and [place] Medication Assistance  Program 2025 Shade Flood, MD Taking Active            Med Note [facility], Cindie Laroche Dec 28, 2023 11:27 AM) [place] and [place] program thru 12/04/2024  fexofenadine (ALLEGRA) 180 MG tablet [id number] Yes TAKE 1 TABLET BY MOUTH EVERY DAY Shade Flood, MD Taking Active   fluticasone [facility]) 50 MCG/ACT nasal spray [id number] Yes PLACE 1-2 SPRAYS INTO BOTH NOSTRILS DAILY. Shade Flood, MD Taking Active   glucose blood (ACCU-CHEK AVIVA PLUS) test strip [id number] Yes Use as  instructed Shade Flood, MD Taking Active   Lancets MISC [id number] Yes Test blood sugar once daily. Dx: E11.9 Shade Flood, MD Taking Active   losartan (COZAAR) 100 MG tablet [id number] Yes Take 1 tablet (100 mg total) by mouth daily. Shade Flood, MD Taking Active   metFORMIN (GLUCOPHAGE) 1000 MG tablet [id number] Yes TAKE 1 TABLET (1,000 MG TOTAL) BY MOUTH TWICE A DAY WITH FOOD Shade Flood, MD Taking Active   metoprolol (TOPROL-XL) 200 MG 24 hr tablet [id number] Yes Take 1 tablet (200 mg total) by mouth daily. Shade Flood, MD Taking Active   Semaglutide,0.25 or 0.5MG /DOS, (OZEMPIC, 0.25 OR 0.5 MG/DOSE,) 2 MG/3ML SOPN [id number] Yes Inject 0.5 mg into the skin once a week. [provider] Taking Active            Med Note Tania Ade Nov 23, 2023  9:39 AM) Novo Nordisk medication assistance program thru 12/04/2024  tamoxifen (NOLVADEX) 20 MG tablet [id number] Yes TAKE 1 TABLET BY MOUTH EVERY DAY Serena Croissant, MD Taking Active               Assessment/Plan:   Diabetes: A1c improving - Continue Ozempic 0.5mg  weekly, metformin 1000mg  twice a day and Farxiga 10mg  daily (room to increase Ozempic in the future if A1c remains > 7.0- would need to send updated Rx for to [employer] if cost changed.  - Continue to check blood glucose daily  Hypertension: - continue losartan, clonidine, amlodipine, metoprolol  Medication Management / Access: - Reviewed medication list and refill history - Reminded patient to call for refills a week before running out of medications  Follow Up Plan: in 4 months to check blood glucose and medication access.   Henrene Pastor, PharmD Clinical Pharmacist [facility] Primary Care  Population Health [phone]

## 2023-12-29 NOTE — Telephone Encounter (Signed)
Patient has been contacted and will pick up shipment this afternoon 12/29/2023

## 2024-01-25 ENCOUNTER — Ambulatory Visit: Payer: Medicare PPO | Admitting: Family Medicine

## 2024-02-15 ENCOUNTER — Ambulatory Visit (INDEPENDENT_AMBULATORY_CARE_PROVIDER_SITE_OTHER): Payer: Medicare PPO | Admitting: Family Medicine

## 2024-02-15 ENCOUNTER — Encounter: Payer: Self-pay | Admitting: Family Medicine

## 2024-02-15 VITALS — BP 138/80 | HR 80 | Temp 98.3°F | Ht 64.0 in | Wt 179.0 lb

## 2024-02-15 DIAGNOSIS — I10 Essential (primary) hypertension: Secondary | ICD-10-CM

## 2024-02-15 DIAGNOSIS — R809 Proteinuria, unspecified: Secondary | ICD-10-CM

## 2024-02-15 DIAGNOSIS — E782 Mixed hyperlipidemia: Secondary | ICD-10-CM | POA: Diagnosis not present

## 2024-02-15 DIAGNOSIS — R0981 Nasal congestion: Secondary | ICD-10-CM | POA: Diagnosis not present

## 2024-02-15 DIAGNOSIS — E1129 Type 2 diabetes mellitus with other diabetic kidney complication: Secondary | ICD-10-CM

## 2024-02-15 DIAGNOSIS — Z7984 Long term (current) use of oral hypoglycemic drugs: Secondary | ICD-10-CM | POA: Diagnosis not present

## 2024-02-15 DIAGNOSIS — Z7985 Long-term (current) use of injectable non-insulin antidiabetic drugs: Secondary | ICD-10-CM

## 2024-02-15 MED ORDER — IPRATROPIUM BROMIDE 0.06 % NA SOLN: 1.0000 | Freq: Four times a day (QID) | NASAL | 5 refills | Status: DC

## 2024-02-15 NOTE — Progress Notes (Signed)
 Subjective:  Patient ID: Susan Miles, female    DOB: [birth year]  Age: 78 y.o. MRN: [id number]  CC:  Chief Complaint  Patient presents with   Medical Management of Chronic Issues   Sinus Problem    Notes sinus pressure, some drainage, congestion in the evening, notes started about a month ago    Health Maintenance    Patient thinks she has had Tetanus booster in recent years pulled [facility] and no Tdap is noted on there patient should make arrangements to receive this in the future     HPI Susan Miles presents for  Follow up. No health or life changes.   Sinus pressure, congestion: Past month, with some nighttime HA and nasal congestion. Taking allegra daily, flonase 2 sprays at bedtime, sometime in am.  Initial fever a month ago. Some HA at that time. Neg covid test, no flu testing.  Blood tinged mucus few weeks ago. No discolored nasal d/c.   Diabetes: Complicated by hyperglycemia, microalbuminuria, CKD.  Improving control in November from 8.5-7.1.  She is on statin with Lipitor, losartan for ARB.  Ozempic 0.5 mg weekly as of November 2024 visit.  Tolerating meds at that time, including Farxiga 10 mg daily, metformin 1000 mg twice daily.  Same today, denies new side effects, UTI or mycotic infection symptoms.no n/v/abd pain/neck swelling.  Fasting readings: 94-160 Postprandial readings: none.  Symptomatic lows - none.  Microalbumin: 2.4 on 05/03/2023.  Prior elevated ratio few years ago.  On ACE inhibitor as above.  Also on Farxiga 10 mg daily. Optho, foot exam, pneumovax: Up-to-date. Hypertension and hyperlipidemia discussed at her November visit with stable LDL, creatinine at that time. Weight down to 174, then crept up.  Wt Readings from Last 3 Encounters:  02/15/24 179 lb (81.2 kg)  10/25/23 180 lb 3.2 oz (81.7 kg)  07/10/23 185 lb (83.9 kg)    Lab Results  Component Value Date   HGBA1C 7.1 (H) 10/25/2023   HGBA1C 8.5 (H) 05/03/2023   HGBA1C 9.0 (H) 02/02/2023    Lab Results  Component Value Date   MICROALBUR 2.4 (H) 05/03/2023   LDLCALC 56 10/25/2023   CREATININE 1.08 10/25/2023    Health maintenance: Option of tetanus through pharmacy discussed. Immunization History  Administered Date(s) Administered   DTaP 02/03/2007   Fluad Quad(high Dose 65+) 08/12/2022   Influenza Split 09/17/2012   Influenza, High Dose Seasonal PF 09/22/2018, 09/06/2019, 09/26/2023   Influenza,inj,Quad PF,6+ Mos 09/30/2013, 07/28/2014, 09/21/2015, 09/22/2016, 08/03/2017   Influenza-Unspecified 08/19/2020, 08/12/2022   PFIZER(Purple Top)SARS-COV-2 Vaccination 01/10/2020, 01/31/2020, 10/03/2020   Pfizer(Comirnaty)Fall Seasonal Vaccine 12 years and older 12/12/2022, 09/26/2023   Pneumococcal Conjugate-13 12/05/2010, 10/27/2014   Pneumococcal Polysaccharide-23 09/21/2015   Zoster Recombinant(Shingrix) 11/03/2022, 12/06/2022   Zoster, Live 03/19/2014      History Patient Active Problem List   Diagnosis Date Noted   Cellulitis and abscess of trunk 07/29/2022   Family history of breast cancer    Ductal carcinoma in situ (DCIS) of right breast 07/04/2019   Hyperlipidemia 12/21/2017   Breast asymmetry 05/01/2015   Cataract, nuclear 05/24/2012   Benign hypertension 02/10/2012   Type 2 diabetes mellitus without complications (HCC) 02/10/2012   Past Medical History:  Diagnosis Date   Breast cancer (HCC)    Cancer (HCC)    right breast cancer   Diabetes mellitus without complication (HCC)    Family history of breast cancer    Hypertension    Personal history of radiation therapy    Sleep  apnea    does not use CPAP   Past Surgical History:  Procedure Laterality Date   ABDOMINAL HYSTERECTOMY     BREAST LUMPECTOMY     BREAST LUMPECTOMY WITH RADIOACTIVE SEED LOCALIZATION Right 08/09/2019   Procedure: RIGHT BREAST LUMPECTOMY X 2 WITH RADIOACTIVE SEED LOCALIZATION (3 SEEDS);  Surgeon: Ovidio Kin, MD;  Location: [facility];  Service: General;   Laterality: Right;   Allergies  Allergen Reactions   Ace Inhibitors Cough   Prior to Admission medications   Medication Sig Start Date End Date Taking? Authorizing Provider  acetaminophen (TYLENOL) 500 MG tablet Take 500 mg by mouth every 6 (six) hours as needed for mild pain.    [provider]  amLODipine (NORVASC) 10 MG tablet Take 1 tablet (10 mg total) by mouth daily. 10/25/23   Shade Flood, MD  aspirin 81 MG tablet Take 81 mg by mouth daily.    [provider]  atorvastatin (LIPITOR) 20 MG tablet TAKE 1 TABLET BY MOUTH DAILY AT 6PM 10/25/23   Shade Flood, MD  Blood Glucose Monitoring Suppl (LDR BLOOD GLUCOSE TRUETEST) w/Device KIT 1 Device by Does not apply route daily. Test blood sugar once daily. Dx: E11.9 05/13/20   Shade Flood, MD  cloNIDine (CATAPRES) 0.2 MG tablet Take 1 tablet (0.2 mg total) by mouth 2 (two) times daily. 10/25/23   Shade Flood, MD  dapagliflozin propanediol (FARXIGA) 10 MG TABS tablet Take 1 tablet (10 mg total) by mouth daily before breakfast. [place] and [place] Medication Assistance Program 2025 11/14/23   Shade Flood, MD  fexofenadine (ALLEGRA) 180 MG tablet TAKE 1 TABLET BY MOUTH EVERY DAY 11/13/23   Shade Flood, MD  fluticasone (FLONASE) 50 MCG/ACT nasal spray PLACE 1-2 SPRAYS INTO BOTH NOSTRILS DAILY. 05/15/23   Shade Flood, MD  glucose blood (ACCU-CHEK AVIVA PLUS) test strip Use as instructed 11/24/23   Shade Flood, MD  Lancets MISC Test blood sugar once daily. Dx: E11.9 01/24/18   Shade Flood, MD  losartan (COZAAR) 100 MG tablet Take 1 tablet (100 mg total) by mouth daily. 10/25/23   Shade Flood, MD  metFORMIN (GLUCOPHAGE) 1000 MG tablet TAKE 1 TABLET (1,000 MG TOTAL) BY MOUTH TWICE A DAY WITH FOOD 10/18/23   Shade Flood, MD  metoprolol (TOPROL-XL) 200 MG 24 hr tablet Take 1 tablet (200 mg total) by mouth daily. 10/25/23   Shade Flood, MD  Semaglutide,0.25 or 0.5MG /DOS, (OZEMPIC,  0.25 OR 0.5 MG/DOSE,) 2 MG/3ML SOPN Inject 0.5 mg into the skin once a week.    [provider]  tamoxifen (NOLVADEX) 20 MG tablet TAKE 1 TABLET BY MOUTH EVERY DAY 07/17/23   Serena Croissant, MD   Social History   Socioeconomic History   Marital status: Married    Spouse name: Not on file   Number of children: Not on file   Years of education: Not on file   Highest education level: Not on file  Occupational History   Not on file  Tobacco Use   Smoking status: Never   Smokeless tobacco: Never  Substance and Sexual Activity   Alcohol use: No   Drug use: No   Sexual activity: Not on file  Other Topics Concern   Not on file  Social History Narrative   f   Social Drivers of Health   Financial Resource Strain: Low Risk  (05/25/2023)   Overall Financial Resource Strain (CARDIA)  Difficulty of Paying Living Expenses: Not very hard  Food Insecurity: No Food Insecurity (05/25/2023)   Hunger Vital Sign    Worried About Running Out of Food in the Last Year: Never true    Ran Out of Food in the Last Year: Never true  Transportation Needs: No Transportation Needs (05/25/2023)   PRAPARE - Administrator, Civil Service (Medical): No    Lack of Transportation (Non-Medical): No  Physical Activity: Inactive (05/25/2023)   Exercise Vital Sign    Days of Exercise per Week: 0 days    Minutes of Exercise per Session: 0 min  Stress: No Stress Concern Present (05/25/2023)   [employer] of Occupational Health - Occupational Stress Questionnaire    Feeling of Stress : Not at all  Social Connections: Socially Integrated (05/25/2023)   Social Connection and Isolation Panel [NHANES]    Frequency of Communication with Friends and Family: Twice a week    Frequency of Social Gatherings with Friends and Family: More than three times a week    Attends Religious Services: More than 4 times per year    Active Member of [employer] or Organizations: Yes    Attends Hospital doctor: More than 4 times per year    Marital Status: Married  Catering manager Violence: Not At Risk (05/25/2023)   Humiliation, Afraid, Rape, and Kick questionnaire    Fear of Current or Ex-Partner: No    Emotionally Abused: No    Physically Abused: No    Sexually Abused: No    Review of Systems  Constitutional:  Negative for fatigue and unexpected weight change.  HENT:  Positive for congestion.   Respiratory:  Negative for chest tightness and shortness of breath.   Cardiovascular:  Negative for chest pain, palpitations and leg swelling.  Gastrointestinal:  Negative for abdominal pain and blood in stool.  Neurological:  Positive for headaches (at night with congestion). Negative for dizziness, syncope and light-headedness.     Objective:   Vitals:   02/15/24 1550 02/15/24 1559  BP: (!) 150/84 138/80  Pulse: 80   Temp: 98.3 F (36.8 C)   TempSrc: Temporal   SpO2: 98%   Weight: 179 lb (81.2 kg)   Height: 5\' 4"  (1.626 m)      Physical Exam Vitals reviewed.  Constitutional:      General: She is not in acute distress.    Appearance: Normal appearance. She is well-developed.  HENT:     Head: Normocephalic and atraumatic.     Right Ear: Hearing, tympanic membrane, ear canal and external ear normal.     Left Ear: Hearing, tympanic membrane, ear canal and external ear normal.     Nose: Nose normal.     Comments: Dried blood septum on right, no active bleeding.  No pain with percussion of frontal or maxillary sinuses but she does note some congestion at night located at frontal sinus area.    Mouth/Throat:     Pharynx: No posterior oropharyngeal erythema.  Eyes:     Conjunctiva/sclera: Conjunctivae normal.     Pupils: Pupils are equal, round, and reactive to light.  Neck:     Vascular: No carotid bruit.  Cardiovascular:     Rate and Rhythm: Normal rate and regular rhythm.     Heart sounds: Normal heart sounds. No murmur heard. Pulmonary:     Effort: Pulmonary effort  is normal. No respiratory distress.     Breath sounds: Normal breath sounds. No wheezing or  rhonchi.  Abdominal:     Palpations: Abdomen is soft. There is no pulsatile mass.     Tenderness: There is no abdominal tenderness.  Musculoskeletal:     Right lower leg: No edema.     Left lower leg: No edema.  Skin:    General: Skin is warm and dry.     Findings: No rash.  Neurological:     Mental Status: She is alert and oriented to person, place, and time.  Psychiatric:        Mood and Affect: Mood normal.        Behavior: Behavior normal.        Assessment & Plan:  Susan Miles is a 78 y.o. female . Type 2 diabetes mellitus with microalbuminuria, without long-term current use of insulin (HCC) - Plan: Hemoglobin A1c, Comprehensive metabolic panel  -Improving control last visit, option of higher dose of Ozempic if A1c starts to creep upward.  Weight loss has plateaued.  Tolerating current regimen, continue Farxiga, metformin, Ozempic same dose for now with adjustment in plan according to lab results.  3 months follow-up  Nasal congestion - Plan: ipratropium (ATROVENT) 0.06 % nasal spray  -With blood-tinged mucus on the right likely from irritated nasal passage.  Saline nasal spray discussed, addition of Atrovent nasal spray if needed for congestion, continue allergy treatment with Flonase, Allegra, with RTC precautions if not improving in the next week or 2.  Consider ENT or allergist eval, possible sinus imaging.  Unlikely bacterial infection at this time.  Potentially may have had worsening of symptoms with viral illness earlier in the month but no recent fever.  Essential hypertension  -Improved on repeat testing, no med changes at this time, recheck in 3 months.  Labs as above  Mixed hyperlipidemia  -Tolerating current med regimen, continue same with lipids next visit.  Meds ordered this encounter  Medications   ipratropium (ATROVENT) 0.06 % nasal spray    Sig: Place 1-2  sprays into both nostrils 4 (four) times daily. As needed for nasal congestion    Dispense:  15 mL    Refill:  5   Patient Instructions  Sinus congestion may be from allergies. Continue flonase, allegra, try the new nasal spray up to 4 times per day. Let me know if that does not help in next week or two.  Saline nasal spray can also help congestion and may help with blood in mucus. There is some dried blood on the right side.  Depending on labs, we can consider higher dose of ozempic. No changes for now.   Tetanus vaccine is an option through your pharmacy, but check to see if covered by insurance.          Signed,   Meredith Staggers, MD [facility], [facility] 02/15/24 4:37 PM

## 2024-02-15 NOTE — Patient Instructions (Addendum)
 Sinus congestion may be from allergies. Continue flonase, allegra, try the new nasal spray up to 4 times per day. Let me know if that does not help in next week or two.  Saline nasal spray can also help congestion and may help with blood in mucus. There is some dried blood on the right side.  Depending on labs, we can consider higher dose of ozempic. No changes for now.   Tetanus vaccine is an option through your pharmacy, but check to see if covered by insurance.

## 2024-02-16 LAB — COMPREHENSIVE METABOLIC PANEL
ALT: 12 U/L (ref 0–35)
AST: 16 U/L (ref 0–37)
Albumin: 4 g/dL (ref 3.5–5.2)
Alkaline Phosphatase: 42 U/L (ref 39–117)
BUN: 14 mg/dL (ref 6–23)
CO2: 22 meq/L (ref 19–32)
Calcium: 10.2 mg/dL (ref 8.4–10.5)
Chloride: 105 meq/L (ref 96–112)
Creatinine, Ser: 0.94 mg/dL (ref 0.40–1.20)
GFR: 58.65 mL/min — ABNORMAL LOW (ref >60.00)
Glucose, Bld: 97 mg/dL (ref 70–99)
Potassium: 4 meq/L (ref 3.5–5.1)
Sodium: 136 meq/L (ref 135–145)
Total Bilirubin: 0.5 mg/dL (ref 0.2–1.2)
Total Protein: 7.1 g/dL (ref 6.0–8.3)

## 2024-02-16 LAB — HEMOGLOBIN A1C: Hgb A1c MFr Bld: 7.2 % — ABNORMAL HIGH (ref 4.6–6.5)

## 2024-02-18 ENCOUNTER — Encounter: Payer: Self-pay | Admitting: Family Medicine

## 2024-03-14 DIAGNOSIS — R809 Proteinuria, unspecified: Secondary | ICD-10-CM | POA: Diagnosis not present

## 2024-03-14 DIAGNOSIS — N182 Chronic kidney disease, stage 2 (mild): Secondary | ICD-10-CM | POA: Diagnosis not present

## 2024-03-14 DIAGNOSIS — E1122 Type 2 diabetes mellitus with diabetic chronic kidney disease: Secondary | ICD-10-CM | POA: Diagnosis not present

## 2024-03-14 DIAGNOSIS — E872 Acidosis, unspecified: Secondary | ICD-10-CM | POA: Diagnosis not present

## 2024-03-14 DIAGNOSIS — I129 Hypertensive chronic kidney disease with stage 1 through stage 4 chronic kidney disease, or unspecified chronic kidney disease: Secondary | ICD-10-CM | POA: Diagnosis not present

## 2024-03-14 DIAGNOSIS — E559 Vitamin D deficiency, unspecified: Secondary | ICD-10-CM | POA: Diagnosis not present

## 2024-04-02 ENCOUNTER — Other Ambulatory Visit: Payer: Medicare PPO | Admitting: Pharmacist

## 2024-04-02 DIAGNOSIS — E1129 Type 2 diabetes mellitus with other diabetic kidney complication: Secondary | ICD-10-CM

## 2024-04-02 MED ORDER — METFORMIN HCL 1000 MG PO TABS: 1000.0000 mg | ORAL_TABLET | Freq: Two times a day (BID) | ORAL | 1 refills | Status: DC

## 2024-04-02 MED ORDER — DAPAGLIFLOZIN PROPANEDIOL 10 MG PO TABS: 10.0000 mg | ORAL_TABLET | Freq: Every day | ORAL | 2 refills | Status: AC

## 2024-04-02 NOTE — Progress Notes (Signed)
 04/02/2024 Name: Susan Miles MRN: [id number] DOB: 10-26-1946  Chief Complaint  Patient presents with   Diabetes   Medication Management    Susan Miles is a 78 y.o. year old female who presented for a telephone visit.   They were referred to the pharmacist by their PCP for assistance in managing diabetes and medication access.    Subjective:  Medication Access/Adherence  Patient is enrolled in medication assistance program for Ozempic with Novo Nordisk and Farxiga  with [place] and [place] thru 12/04/2024.    Patient states she has just received a delivery of Ozempic 0.5mg  dose - she has 4 pens. She reports she will need refill from [place] for Farxiga  10mg  soon. (Last refill was 01/26/2024 for 90 days)   Current Pharmacy:  [id number] [place], [place] - [address] [place] [address] Phone: [phone] Fax: [phone]  [facility] - [place], [place] - [address] [place] [address] Phone: [phone] Fax: [phone]   Patient reports affordability concerns with their medications: Yes  Patient reports access/transportation concerns to their pharmacy: No  Patient reports adherence concerns with their medications:  No      Diabetes:  Current medications: Ozempic 0.5mg  weekly; metformin  1000mg  twice day; Farxiga  10mg  daily   Wt Readings from Last 3 Encounters:  02/15/24 179 lb (81.2 kg)  10/25/23 180 lb 3.2 oz (81.7 kg)  07/10/23 185 lb (83.9 kg)   Patient reports she feels that weight loss has stalled and blood glucose has been higher recently.  She still sees effects of Ozempic on portion control but not as much as when she first started.   Current glucose readings: 120 to 130's usually but highest was 178 this morning - she ate rice last night.  Blood glucose was better about 2 or 3 months ago - ranged from 98 to 115.   Hypertension:  Current medications: clonidine  0.2mg  twice a day,  amlodipine  10mg  daily, metoprolol  XL 200mg  daily, losartan  100mg  daily   BP Readings from Last 3 Encounters:  02/15/24 138/80  10/25/23 128/80  07/10/23 123/71     Objective:  Lab Results  Component Value Date   HGBA1C 7.2 (H) 02/15/2024    Lab Results  Component Value Date   CREATININE 0.94 02/15/2024   BUN 14 02/15/2024   NA 136 02/15/2024   K 4.0 02/15/2024   CL 105 02/15/2024   CO2 22 02/15/2024    Lab Results  Component Value Date   CHOL 108 10/25/2023   HDL 33.80 (L) 10/25/2023   LDLCALC 56 10/25/2023   TRIG 90.0 10/25/2023   CHOLHDL 3 10/25/2023    Medications Reviewed Today     Reviewed by Cecilie Coffee, RPH-CPP (Pharmacist) on 04/02/24 at 1114  Med List Status: <None>   Medication Order Taking? Sig Documenting Provider Last Dose Status Informant  acetaminophen  (TYLENOL ) 500 MG tablet [id number]  Take 500 mg by mouth every 6 (six) hours as needed for mild pain. [provider]  Active Self  amLODipine  (NORVASC ) 10 MG tablet [id number] Yes Take 1 tablet (10 mg total) by mouth daily. Benjiman Bras, MD Taking Active   aspirin 81 MG tablet [id number] Yes Take 81 mg by mouth daily. [provider] Taking Active Self  atorvastatin  (LIPITOR) 20 MG tablet [id number] Yes TAKE 1 TABLET BY MOUTH DAILY AT Salome Credit, MD Taking Active   Blood Glucose Monitoring Suppl (  LDR BLOOD GLUCOSE TRUETEST) w/Device KIT [id number]  1 Device by Does not apply route daily. Test blood sugar once daily. Dx: E11.9 Benjiman Bras, MD  Active   cloNIDine  (CATAPRES ) 0.2 MG tablet [id number] Yes Take 1 tablet (0.2 mg total) by mouth 2 (two) times daily. Benjiman Bras, MD Taking Active   dapagliflozin  propanediol (FARXIGA ) 10 MG TABS tablet [id number] Yes Take 1 tablet (10 mg total) by mouth daily before breakfast. [place] and [place] Medication Assistance Program 2025 Benjiman Bras, MD Taking Active            Med Note [facility], Loree Roe Dec 28, 2023 11:27 AM) [place] program thru 12/04/2024  fexofenadine  (ALLEGRA ) 180 MG tablet [id number] No TAKE 1 TABLET BY MOUTH EVERY DAY  Patient not taking: Reported on 04/02/2024   Benjiman Bras, MD Not Taking Active   fluticasone  (FLONASE ) 50 MCG/ACT nasal spray [id number] Yes PLACE 1-2 SPRAYS INTO BOTH NOSTRILS DAILY. Benjiman Bras, MD Taking Active   glucose blood (ACCU-CHEK AVIVA PLUS) test strip [id number] Yes Use as instructed Benjiman Bras, MD Taking Active   ipratropium (ATROVENT ) 0.06 % nasal spray [id number] Yes Place 1-2 sprays into both nostrils 4 (four) times daily. As needed for nasal congestion Benjiman Bras, MD Taking Active   Lancets MISC [id number] Yes Test blood sugar once daily. Dx: E11.9 Benjiman Bras, MD Taking Active   losartan  (COZAAR ) 100 MG tablet [id number] Yes Take 1 tablet (100 mg total) by mouth daily. Benjiman Bras, MD Taking Active   metFORMIN  (GLUCOPHAGE ) 1000 MG tablet [id number] Yes TAKE 1 TABLET (1,000 MG TOTAL) BY MOUTH TWICE A DAY WITH FOOD Benjiman Bras, MD Taking Active   metoprolol  (TOPROL -XL) 200 MG 24 hr tablet [id number] Yes Take 1 tablet (200 mg total) by mouth daily. Benjiman Bras, MD Taking Active   Semaglutide,0.25 or 0.5MG /DOS, (OZEMPIC, 0.25 OR 0.5 MG/DOSE,) 2 MG/3ML SOPN [id number] Yes Inject 0.5 mg into the skin once a week. [provider] Taking Active            Med Note Irving Mantle Nov 23, 2023  9:39 AM) Novo Nordisk medication assistance program thru 12/04/2024  tamoxifen  (NOLVADEX ) 20 MG tablet [id number] Yes TAKE 1 TABLET BY MOUTH EVERY DAY Cameron Cea, MD Taking Active               Assessment/Plan:   Diabetes: A1c improved over the last year but not at goal and home blood glucose has been higher the last month or two.  - Will check with PCP about increasing Ozempic to 1mg  weekly. Will need to submit update Rx to Novo Nordisk medication assistance program.  - Continue metformin  1000mg  twice a day and  Farxiga  10mg  daily   - Continue to check blood glucose daily  Hypertension: - continue losartan , clonidine , amlodipine , metoprolol   Medication Management / Access: - Reviewed medication list and refill history - [facility] - atorvastatin  and tamoxifen . Sent in refills for metformin .  - Coordinated with [place] and [place] to check on next Farxiga  refill / delivery.   Meds ordered this encounter  Medications   metFORMIN  (GLUCOPHAGE ) 1000 MG tablet    Sig: Take 1 tablet (1,000 mg total) by mouth 2 (two) times daily with a meal.    Dispense:  180 tablet    Refill:  1     Follow Up Plan: in 1 to  2 months to check blood glucose and medication access.   Cecilie Coffee, PharmD Clinical Pharmacist [facility]  Population Health [phone]

## 2024-04-10 NOTE — Progress Notes (Signed)
 04/10/2024 - Addendum Dr Ester Helms approved increase in Ozempic to 1mg . Completed updated dose form for Novo Nordisk medication assistance program. Forwarded to Dr Ester Helms to review and sign.   Cecilie Coffee, PharmD Clinical Pharmacist [facility]  Population Health [phone]

## 2024-04-10 NOTE — Addendum Note (Signed)
 Addended by: Cecilie Coffee B on: 04/10/2024 08:38 AM   Modules accepted: Orders

## 2024-04-11 ENCOUNTER — Telehealth: Payer: Self-pay

## 2024-04-11 NOTE — Telephone Encounter (Signed)
 Novo Nordisk paperwork completed for patient, do you want me to fax this or would you prefer to handle this yourself? I have this on hold at my desk for now!

## 2024-05-03 ENCOUNTER — Other Ambulatory Visit: Payer: Self-pay | Admitting: Pharmacist

## 2024-05-03 NOTE — Progress Notes (Signed)
 05/03/2024 Name: Susan Miles MRN: [id number] DOB: [birth year]  Chief Complaint  Patient presents with   Diabetes    Susan Miles is a 78 y.o. year old female who presented for a telephone visit.   They were referred to the pharmacist by their PCP for assistance in managing diabetes and medication access.    Subjective:  Medication Access/Adherence  Patient is enrolled in medication assistance program for Ozempic with Novo Nordisk and Farxiga  with [facility] thru 12/04/2024.    At our last visit Ozempic dose was increased to 1mg  weekly. Patient was waiting to increase dose until she received delivery from [facility]. Office received delivery 04/23/2024 and notified pt. However, Mrs. Susan Miles has not picked up Ozmepic yet. She was feeling under the weather and was waiting until she felt better. She plans to pick up Ozempic 1mg  next week.  She reports she received delivery from [facility] for Farxiga  10mg  90 day supply around 04/28/2024  Current Pharmacy:  [id number] [place], [place] - [address] [place] [address] Phone: [phone] Fax: [phone]  [facility] - [place], [place] - [address] [place] [address] Phone: [phone] Fax: [phone]   Patient reports affordability concerns with their medications: No  - for 2025 getting Farxiga  and Ozempic thru medication assistance programs. Patient reports access/transportation concerns to their pharmacy: No  Patient reports adherence concerns with their medications:  No      Diabetes:  Current medications: Ozempic 0.5mg  weekly - has not started 1mg  dose yet; metformin  1000mg  twice day; Farxiga  10mg  daily   Wt Readings from Last 3 Encounters:  02/15/24 179 lb (81.2 kg)  10/25/23 180 lb 3.2 oz (81.7 kg)  07/10/23 185 lb (83.9 kg)   Patient reports she feels that weight loss has stalled and blood glucose has been higher recently but we hope this  will improve after she starts 1mg  of Ozempic.   Current glucose readings: 130 to 164   Hypertension:  Current medications: clonidine  0.2mg  twice a day, amlodipine  10mg  daily, metoprolol  XL 200mg  daily, losartan  100mg  daily   BP Readings from Last 3 Encounters:  02/15/24 138/80  10/25/23 128/80  07/10/23 123/71     Objective:  Lab Results  Component Value Date   HGBA1C 7.2 (H) 02/15/2024    Lab Results  Component Value Date   CREATININE 0.94 02/15/2024   BUN 14 02/15/2024   NA 136 02/15/2024   K 4.0 02/15/2024   CL 105 02/15/2024   CO2 22 02/15/2024    Lab Results  Component Value Date   CHOL 108 10/25/2023   HDL 33.80 (L) 10/25/2023   LDLCALC 56 10/25/2023   TRIG 90.0 10/25/2023   CHOLHDL 3 10/25/2023    Medications Reviewed Today     Reviewed by Cecilie Coffee, RPH-CPP (Pharmacist) on 05/03/24 at 1107  Med List Status: <None>   Medication Order Taking? Sig Documenting Provider Last Dose Status Informant  acetaminophen  (TYLENOL ) 500 MG tablet [id number]  Take 500 mg by mouth every 6 (six) hours as needed for mild pain. [provider]  Active Self  amLODipine  (NORVASC ) 10 MG tablet [id number] Yes Take 1 tablet (10 mg total) by mouth daily. Benjiman Bras, MD Taking Active   aspirin 81 MG tablet [id number] Yes Take 81 mg by mouth daily. [provider] Taking Active Self  atorvastatin  (LIPITOR) 20 MG tablet [id number] Yes TAKE 1  TABLET BY MOUTH DAILY AT Salome Credit, MD Taking Active   Blood Glucose Monitoring Suppl (LDR BLOOD GLUCOSE TRUETEST) w/Device KIT [id number]  1 Device by Does not apply route daily. Test blood sugar once daily. Dx: E11.9 Benjiman Bras, MD  Active   cloNIDine  (CATAPRES ) 0.2 MG tablet [id number] Yes Take 1 tablet (0.2 mg total) by mouth 2 (two) times daily. Benjiman Bras, MD Taking Active   dapagliflozin  propanediol (FARXIGA ) 10 MG TABS tablet [id number] Yes Take 1 tablet (10 mg total) by mouth daily before  breakfast. Benjiman Bras, MD Taking Active            Med Note [facility], [facility]   Fri May 03, 2024 11:07 AM) [facility] thru 12/04/2024  fexofenadine  (ALLEGRA ) 180 MG tablet [id number] No TAKE 1 TABLET BY MOUTH EVERY DAY  Patient not taking: Reported on 05/03/2024   Benjiman Bras, MD Not Taking Active   fluticasone  (FLONASE ) 50 MCG/ACT nasal spray [id number] Yes PLACE 1-2 SPRAYS INTO BOTH NOSTRILS DAILY. Benjiman Bras, MD Taking Active   glucose blood (ACCU-CHEK AVIVA PLUS) test strip [id number] Yes Use as instructed Benjiman Bras, MD Taking Active   ipratropium (ATROVENT ) 0.06 % nasal spray [id number]  Place 1-2 sprays into both nostrils 4 (four) times daily. As needed for nasal congestion Benjiman Bras, MD  Active   Lancets MISC [id number] Yes Test blood sugar once daily. Dx: E11.9 Benjiman Bras, MD Taking Active   losartan  (COZAAR ) 100 MG tablet [id number] Yes Take 1 tablet (100 mg total) by mouth daily. Benjiman Bras, MD Taking Active   metFORMIN  (GLUCOPHAGE ) 1000 MG tablet [id number] Yes Take 1 tablet (1,000 mg total) by mouth 2 (two) times daily with a meal. Benjiman Bras, MD Taking Active   metoprolol  (TOPROL -XL) 200 MG 24 hr tablet [id number] Yes Take 1 tablet (200 mg total) by mouth daily. Benjiman Bras, MD Taking Active   Semaglutide, 1 MG/DOSE, 4 MG/3ML Charisse Conception [id number]  Inject 1 mg as directed once a week. Benjiman Bras, MD  Active            Med Note [facility], [facility]   Fri May 03, 2024 11:06 AM) Will start 05/05/2024  Semaglutide,0.25 or 0.5MG /DOS, (OZEMPIC, 0.25 OR 0.5 MG/DOSE,) 2 MG/3ML SOPN [id number] Yes Inject 0.5 mg into the skin once a week. [provider] Taking Active            Med Note [facility]   Wed Apr 10, 2024  8:36 AM) Will change to 1mg  dose once received from Novo Nordisk medication assistance program   tamoxifen  (NOLVADEX ) 20 MG tablet [id number] Yes TAKE 1 TABLET BY MOUTH EVERY DAY Cameron Cea, MD Taking Active                Assessment/Plan:   Diabetes: A1c improved over the last year but not at goal and home blood glucose has been higher the last month or two.  - Recommended she go ahead and increase to Ozempic to 1mg  weekly. Reminded her that the new dose was delivered to our office and is ready for pick up.  - Continue metformin  1000mg  twice a day and Farxiga  10mg  daily   - Continue to check blood glucose daily  Hypertension: - continue losartan , clonidine , amlodipine , metoprolol   Medication Management / Access: - Reviewed medication list and refill history    Follow Up Plan: 2 months to check blood glucose and medication  access. She will follow up with Dr Ester Helms 05/22/2024  Cecilie Coffee, PharmD Clinical Pharmacist [facility]  Population Health [phone]

## 2024-05-12 ENCOUNTER — Other Ambulatory Visit: Payer: Self-pay | Admitting: Family Medicine

## 2024-05-12 DIAGNOSIS — I10 Essential (primary) hypertension: Secondary | ICD-10-CM

## 2024-05-22 ENCOUNTER — Ambulatory Visit: Admitting: Family Medicine

## 2024-05-22 ENCOUNTER — Encounter: Payer: Self-pay | Admitting: Family Medicine

## 2024-05-22 VITALS — BP 108/66 | HR 78 | Resp 16 | Ht 64.0 in | Wt 177.0 lb

## 2024-05-22 DIAGNOSIS — J309 Allergic rhinitis, unspecified: Secondary | ICD-10-CM

## 2024-05-22 DIAGNOSIS — Z7984 Long term (current) use of oral hypoglycemic drugs: Secondary | ICD-10-CM

## 2024-05-22 DIAGNOSIS — Z7985 Long-term (current) use of injectable non-insulin antidiabetic drugs: Secondary | ICD-10-CM

## 2024-05-22 DIAGNOSIS — R0981 Nasal congestion: Secondary | ICD-10-CM | POA: Diagnosis not present

## 2024-05-22 DIAGNOSIS — I10 Essential (primary) hypertension: Secondary | ICD-10-CM

## 2024-05-22 DIAGNOSIS — R809 Proteinuria, unspecified: Secondary | ICD-10-CM

## 2024-05-22 DIAGNOSIS — E1129 Type 2 diabetes mellitus with other diabetic kidney complication: Secondary | ICD-10-CM | POA: Diagnosis not present

## 2024-05-22 DIAGNOSIS — E782 Mixed hyperlipidemia: Secondary | ICD-10-CM

## 2024-05-22 LAB — LIPID PANEL
Cholesterol: 114 mg/dL (ref 0–200)
HDL: 35.4 mg/dL — ABNORMAL LOW (ref >39.00)
LDL Cholesterol: 62 mg/dL (ref 0–99)
NonHDL: 79.02
Total CHOL/HDL Ratio: 3
Triglycerides: 86 mg/dL (ref 0.0–149.0)
VLDL: 17.2 mg/dL (ref 0.0–40.0)

## 2024-05-22 LAB — COMPREHENSIVE METABOLIC PANEL WITH GFR
ALT: 11 U/L (ref 0–35)
AST: 14 U/L (ref 0–37)
Albumin: 4 g/dL (ref 3.5–5.2)
Alkaline Phosphatase: 50 U/L (ref 39–117)
BUN: 17 mg/dL (ref 6–23)
CO2: 29 meq/L (ref 19–32)
Calcium: 10.4 mg/dL (ref 8.4–10.5)
Chloride: 103 meq/L (ref 96–112)
Creatinine, Ser: 1.01 mg/dL (ref 0.40–1.20)
GFR: 53.71 mL/min — ABNORMAL LOW (ref >60.00)
Glucose, Bld: 122 mg/dL — ABNORMAL HIGH (ref 70–99)
Potassium: 4 meq/L (ref 3.5–5.1)
Sodium: 136 meq/L (ref 135–145)
Total Bilirubin: 0.5 mg/dL (ref 0.2–1.2)
Total Protein: 7.6 g/dL (ref 6.0–8.3)

## 2024-05-22 LAB — MICROALBUMIN / CREATININE URINE RATIO
Creatinine,U: 114.3 mg/dL
Microalb Creat Ratio: 27.1 mg/g (ref 0.0–30.0)
Microalb, Ur: 3.1 mg/dL — ABNORMAL HIGH (ref 0.0–1.9)

## 2024-05-22 LAB — HEMOGLOBIN A1C: Hgb A1c MFr Bld: 7.1 % — ABNORMAL HIGH (ref 4.6–6.5)

## 2024-05-22 MED ORDER — FEXOFENADINE HCL 180 MG PO TABS: 180.0000 mg | ORAL_TABLET | Freq: Every day | ORAL | 3 refills | Status: DC

## 2024-05-22 MED ORDER — FLUTICASONE PROPIONATE 50 MCG/ACT NA SUSP: 1.0000 | Freq: Every day | NASAL | 2 refills | Status: AC

## 2024-05-22 MED ORDER — ATORVASTATIN CALCIUM 20 MG PO TABS
ORAL_TABLET | ORAL | 1 refills | Status: DC
Start: 2024-05-22 — End: 2024-08-26

## 2024-05-22 MED ORDER — LOSARTAN POTASSIUM 100 MG PO TABS: 100.0000 mg | ORAL_TABLET | Freq: Every day | ORAL | 1 refills | Status: DC

## 2024-05-22 MED ORDER — AMLODIPINE BESYLATE 10 MG PO TABS
10.0000 mg | ORAL_TABLET | Freq: Every day | ORAL | 1 refills | Status: DC
Start: 2024-05-22 — End: 2024-08-26

## 2024-05-22 MED ORDER — METOPROLOL SUCCINATE ER 200 MG PO TB24
200.0000 mg | ORAL_TABLET | Freq: Every day | ORAL | 1 refills | Status: DC
Start: 2024-05-22 — End: 2024-08-26

## 2024-05-22 NOTE — Patient Instructions (Addendum)
 Blood pressure is on the lower side today.  Continue to monitor that at home and if you get any lower readings we will likely back off the clonidine  dosage.  Let me know.  No changes for now.  Take care!

## 2024-05-22 NOTE — Progress Notes (Signed)
 Subjective:  Patient ID: Susan Miles, female    DOB: 1946-12-04  Age: 78 y.o. MRN: [id number]  CC:  Chief Complaint  Patient presents with   Medical Management of Chronic Issues    HPI [name] presents for   Diabetes: Complicated by hyperglycemia, microalbuminuria, CKD.  Treated with Ozempic, Farxiga , metformin .  Denies UTI or mycotic infection symptoms with SGLT2 and no nausea/vomiting/abdominal pain or neck swelling with her Ozempic.  She is on Lipitor for statin, losartan  for ARB.  Overall stable A1c in March. Followed by pharmacist as well - appt noted 5/30, had not yet started 1mg  ozempic. Took 1st dose 6/1 - few doses. No new side effects. Home readings fasting: 98-144 Postprandial 150-160.  No symptomatic lows.  Microalbumin: 2.4 on 05/03/2023 on SGLT2 and ACE inhibitor. Optho, foot exam, pneumovax: Optho - October, Dr. Madelyn Schick.  Covid booster recommended.  Foot exam today.   Diabetic Foot Exam - Simple   Simple Foot Form Diabetic Foot exam was performed with the following findings: Yes 05/22/2024 10:55 AM  Visual Inspection No deformities, no ulcerations, no other skin breakdown bilaterally: Yes Sensation Testing Intact to touch and monofilament testing bilaterally: Yes Pulse Check Posterior Tibialis and Dorsalis pulse intact bilaterally: Yes Comments      Lab Results  Component Value Date   HGBA1C 7.2 (H) 02/15/2024   HGBA1C 7.1 (H) 10/25/2023   HGBA1C 8.5 (H) 05/03/2023   Lab Results  Component Value Date   MICROALBUR 2.4 (H) 05/03/2023   LDLCALC 56 10/25/2023   CREATININE 0.94 02/15/2024   Wt Readings from Last 3 Encounters:  05/22/24 177 lb (80.3 kg)  02/15/24 179 lb (81.2 kg)  10/25/23 180 lb 3.2 oz (81.7 kg)   Allergies well-controlled with current regimen of Allegra , Flonase  and Atrovent  as needed.  Hyperlipidemia: Lipitor 20 mg daily without new myalgias or side effects. Fasting today.  Lab Results  Component Value Date    CHOL 108 10/25/2023   HDL 33.80 (L) 10/25/2023   LDLCALC 56 10/25/2023   TRIG 90.0 10/25/2023   CHOLHDL 3 10/25/2023   Lab Results  Component Value Date   ALT 12 02/15/2024   AST 16 02/15/2024   ALKPHOS 42 02/15/2024   BILITOT 0.5 02/15/2024   Hypertension: Treated with amlodipine , losartan , Toprol , clonidine  and history of chronic peripheral edema versus lymphedema managed by sodium avoidance.  Denies  side effects with medications and home readings have been 120-130/88-89.  Some swelling in ankle, resolves overnight. No calf pain/swelling.   BP Readings from Last 3 Encounters:  05/22/24 108/66  02/15/24 138/80  10/25/23 128/80   Lab Results  Component Value Date   CREATININE 0.94 02/15/2024      DCIS Diagnosed in 2020 treated with lumpectomy radiation and tamoxifen  for 5 years, Dr. Lee Public.   History Patient Active Problem List   Diagnosis Date Noted   Cellulitis and abscess of trunk 07/29/2022   Family history of breast cancer    Ductal carcinoma in situ (DCIS) of right breast 07/04/2019   Hyperlipidemia 12/21/2017   Breast asymmetry 05/01/2015   Cataract, nuclear 05/24/2012   Benign hypertension 02/10/2012   Type 2 diabetes mellitus without complications (HCC) 02/10/2012   Past Medical History:  Diagnosis Date   Breast cancer (HCC)    Cancer (HCC)    right breast cancer   Diabetes mellitus without complication (HCC)    Family history of breast cancer    Hypertension    Personal history of radiation therapy  Sleep apnea    does not use CPAP   Past Surgical History:  Procedure Laterality Date   ABDOMINAL HYSTERECTOMY     BREAST LUMPECTOMY     BREAST LUMPECTOMY WITH RADIOACTIVE SEED LOCALIZATION Right 08/09/2019   Procedure: RIGHT BREAST LUMPECTOMY X 2 WITH RADIOACTIVE SEED LOCALIZATION (3 SEEDS);  Surgeon: Juanita Norlander, MD;  Location: [facility];  Service: General;  Laterality: Right;   Allergies  Allergen Reactions   Ace Inhibitors  Cough   Prior to Admission medications   Medication Sig Start Date End Date Taking? Authorizing Provider  acetaminophen  (TYLENOL ) 500 MG tablet Take 500 mg by mouth every 6 (six) hours as needed for mild pain.   Yes [provider]  amLODipine  (NORVASC ) 10 MG tablet Take 1 tablet (10 mg total) by mouth daily. 10/25/23  Yes Benjiman Bras, MD  aspirin 81 MG tablet Take 81 mg by mouth daily.   Yes [provider]  atorvastatin  (LIPITOR) 20 MG tablet TAKE 1 TABLET BY MOUTH DAILY AT 6PM 10/25/23  Yes Benjiman Bras, MD  Blood Glucose Monitoring Suppl (LDR BLOOD GLUCOSE TRUETEST) w/Device KIT 1 Device by Does not apply route daily. Test blood sugar once daily. Dx: E11.9 05/13/20  Yes Benjiman Bras, MD  cloNIDine  (CATAPRES ) 0.2 MG tablet TAKE 1 TABLET BY MOUTH 2 TIMES DAILY. 05/13/24  Yes Benjiman Bras, MD  dapagliflozin  propanediol (FARXIGA ) 10 MG TABS tablet Take 1 tablet (10 mg total) by mouth daily before breakfast. 04/02/24  Yes Benjiman Bras, MD  fexofenadine  (ALLEGRA ) 180 MG tablet TAKE 1 TABLET BY MOUTH EVERY DAY 11/13/23  Yes Benjiman Bras, MD  fluticasone  (FLONASE ) 50 MCG/ACT nasal spray PLACE 1-2 SPRAYS INTO BOTH NOSTRILS DAILY. 05/15/23  Yes Benjiman Bras, MD  glucose blood (ACCU-CHEK AVIVA PLUS) test strip Use as instructed 11/24/23  Yes Benjiman Bras, MD  ipratropium (ATROVENT ) 0.06 % nasal spray Place 1-2 sprays into both nostrils 4 (four) times daily. As needed for nasal congestion 02/15/24  Yes Benjiman Bras, MD  Lancets MISC Test blood sugar once daily. Dx: E11.9 01/24/18  Yes Benjiman Bras, MD  losartan  (COZAAR ) 100 MG tablet Take 1 tablet (100 mg total) by mouth daily. 10/25/23  Yes Benjiman Bras, MD  metFORMIN  (GLUCOPHAGE ) 1000 MG tablet Take 1 tablet (1,000 mg total) by mouth 2 (two) times daily with a meal. 04/02/24  Yes Benjiman Bras, MD  metoprolol  (TOPROL -XL) 200 MG 24 hr tablet Take 1 tablet (200 mg total) by mouth daily.  10/25/23  Yes Benjiman Bras, MD  Semaglutide, 1 MG/DOSE, 4 MG/3ML SOPN Inject 1 mg as directed once a week. 04/24/24  Yes Benjiman Bras, MD  Semaglutide,0.25 or 0.5MG /DOS, (OZEMPIC, 0.25 OR 0.5 MG/DOSE,) 2 MG/3ML SOPN Inject 0.5 mg into the skin once a week.   Yes [provider]  tamoxifen  (NOLVADEX ) 20 MG tablet TAKE 1 TABLET BY MOUTH EVERY DAY 07/17/23  Yes Cameron Cea, MD   Social History   Socioeconomic History   Marital status: Married    Spouse name: Not on file   Number of children: Not on file   Years of education: Not on file   Highest education level: Not on file  Occupational History   Not on file  Tobacco Use   Smoking status: Never   Smokeless tobacco: Never  Substance and Sexual Activity   Alcohol use: No   Drug use: No   Sexual activity: Not  on file  Other Topics Concern   Not on file  Social History Narrative   f   Social Drivers of Health   Financial Resource Strain: Low Risk  (05/25/2023)   Overall Financial Resource Strain (CARDIA)    Difficulty of Paying Living Expenses: Not very hard  Food Insecurity: No Food Insecurity (05/25/2023)   Hunger Vital Sign    Worried About Running Out of Food in the Last Year: Never true    Ran Out of Food in the Last Year: Never true  Transportation Needs: No Transportation Needs (05/25/2023)   PRAPARE - Administrator, Civil Service (Medical): No    Lack of Transportation (Non-Medical): No  Physical Activity: Inactive (05/25/2023)   Exercise Vital Sign    Days of Exercise per Week: 0 days    Minutes of Exercise per Session: 0 min  Stress: No Stress Concern Present (05/25/2023)   [employer] of Occupational Health - Occupational Stress Questionnaire    Feeling of Stress : Not at all  Social Connections: Socially Integrated (05/25/2023)   Social Connection and Isolation Panel    Frequency of Communication with Friends and Family: Twice a week    Frequency of Social Gatherings with Friends  and Family: More than three times a week    Attends Religious Services: More than 4 times per year    Active Member of [employer] or Organizations: Yes    Attends Engineer, structural: More than 4 times per year    Marital Status: Married  Catering manager Violence: Not At Risk (05/25/2023)   Humiliation, Afraid, Rape, and Kick questionnaire    Fear of Current or Ex-Partner: No    Emotionally Abused: No    Physically Abused: No    Sexually Abused: No    Review of Systems  Constitutional:  Negative for fatigue and unexpected weight change.  Respiratory:  Negative for chest tightness and shortness of breath.   Cardiovascular:  Negative for chest pain, palpitations and leg swelling.  Gastrointestinal:  Negative for abdominal pain and blood in stool.  Neurological:  Negative for dizziness, syncope, light-headedness and headaches.     Objective:   Vitals:   05/22/24 0952  BP: 108/66  Pulse: 78  Resp: 16  SpO2: 96%  Weight: 177 lb (80.3 kg)  Height: 5' 4 (1.626 m)     Physical Exam Vitals reviewed.  Constitutional:      Appearance: Normal appearance. She is well-developed.  HENT:     Head: Normocephalic and atraumatic.   Eyes:     Conjunctiva/sclera: Conjunctivae normal.     Pupils: Pupils are equal, round, and reactive to light.   Neck:     Vascular: No carotid bruit.   Cardiovascular:     Rate and Rhythm: Normal rate and regular rhythm.     Heart sounds: Normal heart sounds.  Pulmonary:     Effort: Pulmonary effort is normal.     Breath sounds: Normal breath sounds.  Abdominal:     Palpations: Abdomen is soft. There is no pulsatile mass.     Tenderness: There is no abdominal tenderness.   Musculoskeletal:     Right lower leg: No edema.     Left lower leg: No edema.   Skin:    General: Skin is warm and dry.   Neurological:     Mental Status: She is alert and oriented to person, place, and time.   Psychiatric:        Mood and  Affect: Mood normal.         Behavior: Behavior normal.        Assessment & Plan:  Hedy Garro is a 78 y.o. female . Type 2 diabetes mellitus with microalbuminuria, without long-term current use of insulin (HCC) - Plan: Microalbumin / creatinine urine ratio, Hemoglobin A1c  - Near goal previously, anticipate improved readings with recent increase in Ozempic to 1 mg, past few weeks.  Check labs and adjust plan accordingly but again just recently increased dosage.  No other med changes at this time, 23-month follow-up.  Mixed hyperlipidemia - Plan: Lipid panel, Comprehensive metabolic panel with GFR, atorvastatin  (LIPITOR) 20 MG tablet  - Tolerating current regimen, check labs and adjust plan accordingly.  Essential hypertension - Plan: Comprehensive metabolic panel with GFR, amLODipine  (NORVASC ) 10 MG tablet, losartan  (COZAAR ) 100 MG tablet, metoprolol  (TOPROL -XL) 200 MG 24 hr tablet  - Stable on home readings, in office slightly lower but asymptomatic.  Continued home monitoring and if lower readings in clinic could consider decreasing clonidine .  Allergic rhinitis, unspecified seasonality, unspecified trigger - Plan: fexofenadine  (ALLEGRA ) 180 MG tablet, fluticasone  (FLONASE ) 50 MCG/ACT nasal spray Nasal congestion  - Stable with current regimen, no changes.  Meds ordered this encounter  Medications   amLODipine  (NORVASC ) 10 MG tablet    Sig: Take 1 tablet (10 mg total) by mouth daily.    Dispense:  90 tablet    Refill:  1   atorvastatin  (LIPITOR) 20 MG tablet    Sig: TAKE 1 TABLET BY MOUTH DAILY AT 6PM    Dispense:  90 tablet    Refill:  1   fexofenadine  (ALLEGRA ) 180 MG tablet    Sig: Take 1 tablet (180 mg total) by mouth daily.    Dispense:  90 tablet    Refill:  3   fluticasone  (FLONASE ) 50 MCG/ACT nasal spray    Sig: Place 1-2 sprays into both nostrils daily.    Dispense:  48 mL    Refill:  2   losartan  (COZAAR ) 100 MG tablet    Sig: Take 1 tablet (100 mg total) by mouth daily.     Dispense:  90 tablet    Refill:  1   metoprolol  (TOPROL -XL) 200 MG 24 hr tablet    Sig: Take 1 tablet (200 mg total) by mouth daily.    Dispense:  90 tablet    Refill:  1   Patient Instructions  Blood pressure is on the lower side today.  Continue to monitor that at home and if you get any lower readings we will likely back off the clonidine  dosage.  Let me know.  No changes for now.  Take care!        Signed,   Caro Christmas, MD [facility], [facility] 05/22/24 10:58 AM

## 2024-05-26 ENCOUNTER — Ambulatory Visit: Payer: Self-pay | Admitting: Family Medicine

## 2024-07-02 ENCOUNTER — Other Ambulatory Visit: Payer: Self-pay | Admitting: Family Medicine

## 2024-07-02 DIAGNOSIS — Z1231 Encounter for screening mammogram for malignant neoplasm of breast: Secondary | ICD-10-CM

## 2024-07-04 ENCOUNTER — Other Ambulatory Visit: Payer: Self-pay | Admitting: Pharmacist

## 2024-07-04 ENCOUNTER — Encounter: Payer: Self-pay | Admitting: Pharmacist

## 2024-07-04 ENCOUNTER — Other Ambulatory Visit: Payer: Self-pay | Admitting: Hematology and Oncology

## 2024-07-04 NOTE — Progress Notes (Signed)
 07/04/2024 Name: Tamzin Bertling MRN: [id number] DOB: 06/21/1946  Chief Complaint  Patient presents with   Diabetes   Medication Management    Susan Miles is a 78 y.o. year old female who presented for a telephone visit.   They were referred to the pharmacist by their PCP for assistance in managing diabetes and medication access.    Subjective:  Medication Access/Adherence  Patient is enrolled in medication assistance program for Ozempic with Novo Nordisk and Farxiga  with [facility] thru 12/04/2024.    Picked up Ozempic 1mg  weekly and started 05/2024.  She reports she received delivery from [facility] for Farxiga  10mg  - 90 day supply around 04/28/2024  Current Pharmacy:  [id number] [place], [place] - [address] [place] [address] Phone: [phone] Fax: [phone]  [facility] - [place], [place] - [address] [place] [address] Phone: [phone] Fax: [phone]   Patient reports affordability concerns with their medications: No  - for 2025 getting Farxiga  and Ozempic thru medication assistance programs. Patient reports access/transportation concerns to their pharmacy: No  Patient reports adherence concerns with their medications:  No      Diabetes:  Current medications: Ozempic 1mg  weekly,  metformin  1000mg  twice day; Farxiga  10mg  daily   Patient reports she is tolerating Ozempic. No nausea but does notice better controlled appetite. She reports occasional constipation - not currently taking any over-the-counter meds for constipation. She report eating a lot of vegetables.   Patient also asks about lowering dose of metformin  in the future.    Weight prior to starting Ozempic = 188 lbs Total weight loss - 15 lbs  Wt Readings from Last 3 Encounters:  07/04/24 173 lb (78.5 kg)  05/22/24 177 lb (80.3 kg)  02/15/24 179 lb (81.2 kg)    Current glucose readings: 100 to 160's - usually in  low 100's unless she eats some she shouldn't - something with a lot of carbs  Hypertension:  Current medications: clonidine  0.2mg  twice a day, amlodipine  10mg  daily, metoprolol  XL 200mg  daily, losartan  100mg  daily   BP Readings from Last 3 Encounters:  05/22/24 108/66  02/15/24 138/80  10/25/23 128/80     Objective:  Lab Results  Component Value Date   HGBA1C 7.1 (H) 05/22/2024    Lab Results  Component Value Date   CREATININE 1.01 05/22/2024   BUN 17 05/22/2024   NA 136 05/22/2024   K 4.0 05/22/2024   CL 103 05/22/2024   CO2 29 05/22/2024    Lab Results  Component Value Date   CHOL 114 05/22/2024   HDL 35.40 (L) 05/22/2024   LDLCALC 62 05/22/2024   TRIG 86.0 05/22/2024   CHOLHDL 3 05/22/2024    Medications Reviewed Today     Reviewed by Carla Milling, RPH-CPP (Pharmacist) on 07/04/24 at 1151  Med List Status: <None>   Medication Order Taking? Sig Documenting Provider Last Dose Status Informant  acetaminophen  (TYLENOL ) 500 MG tablet [id number] Yes Take 500 mg by mouth every 6 (six) hours as needed for mild pain. [provider]  Active Self  amLODipine  (NORVASC ) 10 MG tablet [id number] Yes Take 1 tablet (10 mg total) by mouth daily. Levora Reyes SAUNDERS, MD  Active   aspirin 81 MG tablet [id number]  Take 81 mg by mouth daily. [provider]  Active Self  atorvastatin  (LIPITOR) 20 MG tablet [id number] Yes TAKE 1 TABLET BY MOUTH DAILY  AT JEREL Levora Reyes JONELLE, MD  Active   Blood Glucose Monitoring Suppl (LDR BLOOD GLUCOSE TRUETEST) w/Device KIT [id number]  1 Device by Does not apply route daily. Test blood sugar once daily. Dx: E11.9 Levora Reyes JONELLE, MD  Active   cloNIDine  (CATAPRES ) 0.2 MG tablet [id number] Yes TAKE 1 TABLET BY MOUTH 2 TIMES DAILY. Levora Reyes JONELLE, MD  Active   dapagliflozin  propanediol (FARXIGA ) 10 MG TABS tablet [id number] Yes Take 1 tablet (10 mg total) by mouth daily before breakfast. Levora Reyes JONELLE, MD  Active            Med Note  [facility]   Fri May 03, 2024 11:07 AM) [facility] thru 12/04/2024   Patient not taking:   Discontinued 07/04/24 1139 (No longer needed (for PRN medications))   fluticasone  (FLONASE ) 50 MCG/ACT nasal spray [id number]  Place 1-2 sprays into both nostrils daily.  Patient not taking: Reported on 07/04/2024   Levora Reyes JONELLE, MD  Active   glucose blood (ACCU-CHEK AVIVA PLUS) test strip [id number] Yes Use as instructed Levora Reyes JONELLE, MD  Active   ipratropium (ATROVENT ) 0.06 % nasal spray [id number] Yes Place 1-2 sprays into both nostrils 4 (four) times daily. As needed for nasal congestion Levora Reyes JONELLE, MD  Active   Lancets MISC [id number] Yes Test blood sugar once daily. Dx: E11.9 Levora Reyes JONELLE, MD  Active   losartan  (COZAAR ) 100 MG tablet [id number] Yes Take 1 tablet (100 mg total) by mouth daily. Levora Reyes JONELLE, MD  Active   metFORMIN  (GLUCOPHAGE ) 1000 MG tablet [id number] Yes Take 1 tablet (1,000 mg total) by mouth 2 (two) times daily with a meal. Levora Reyes JONELLE, MD  Active   metoprolol  (TOPROL -XL) 200 MG 24 hr tablet [id number] Yes Take 1 tablet (200 mg total) by mouth daily. Levora Reyes JONELLE, MD  Active   Semaglutide, 1 MG/DOSE, 4 MG/3ML NELMA [id number] Yes Inject 1 mg as directed once a week. Levora Reyes JONELLE, MD  Active            Med Note [facility] [place] [facility]   Fri May 03, 2024 11:06 AM) Will start 05/05/2024  tamoxifen  (NOLVADEX ) 20 MG tablet [id number] Yes TAKE 1 TABLET BY MOUTH EVERY DAY Odean Potts, MD  Active               Assessment/Plan:   Diabetes: A1c improved over the last year but not at goal and home blood glucose has been higher the last month or two.  - Continue Ozempic to 1mg  weekly. - Continue metformin  1000mg  twice a day and Farxiga  10mg  daily.  - Discussed possibly lowering dose of metformin  in the future - if next A1c if 6.5 or less, could consider change to metformin  ER 1000mg  once daily.  - Continue to check blood glucose daily  Hypertension: -  continue losartan , clonidine , amlodipine , metoprolol   Medication Management / Access: - Reviewed medication list and refill history - Patient has several meds filled yesterday but has not picked up yet. She is due to refill losartan  next week. With patient's premission requested refill for losartan  today so she can pick up with other meds. [facility]    Follow Up Plan: 2 months to check blood glucose and medication access. She will follow up with Dr Levora 08/26/2024  Madelin Ray, PharmD Clinical Pharmacist [facility]  Population Health [phone]

## 2024-07-09 ENCOUNTER — Inpatient Hospital Stay: Payer: Medicare PPO | Admitting: Adult Health

## 2024-07-09 ENCOUNTER — Telehealth: Payer: Self-pay

## 2024-07-09 NOTE — Telephone Encounter (Signed)
 Called pt regarding No show for her appt today. Spoke with pt and she forgot about her appt.  Call transferred to scheduling

## 2024-07-09 NOTE — Telephone Encounter (Signed)
 Type of form received: Medication assistance program through [facility]   Additional comments: refill request   Received by: fax  Form should be Faxed/mailed to: [phone]  Is patient requesting call for pickup: no  Form placed:  in providers sign folder at the nurse station  Attach charge sheet.  Provider will determine charge.  Individual made aware of 3-5 business day turn around No?

## 2024-07-10 NOTE — Telephone Encounter (Signed)
 Paperwork completed and placed in fax bin at back nurse station

## 2024-07-11 NOTE — Telephone Encounter (Signed)
 Faxed back to requested number from [employer] [phone]

## 2024-07-15 ENCOUNTER — Inpatient Hospital Stay: Attending: Adult Health | Admitting: Adult Health

## 2024-07-15 ENCOUNTER — Encounter: Payer: Self-pay | Admitting: Adult Health

## 2024-07-15 VITALS — BP 134/72 | HR 87 | Temp 98.2°F | Resp 18 | Ht 64.0 in | Wt 175.7 lb

## 2024-07-15 DIAGNOSIS — Z170 Estrogen receptor positive status [ER+]: Secondary | ICD-10-CM | POA: Insufficient documentation

## 2024-07-15 DIAGNOSIS — Z79810 Long term (current) use of selective estrogen receptor modulators (SERMs): Secondary | ICD-10-CM | POA: Insufficient documentation

## 2024-07-15 DIAGNOSIS — Z1721 Progesterone receptor positive status: Secondary | ICD-10-CM | POA: Insufficient documentation

## 2024-07-15 DIAGNOSIS — Z923 Personal history of irradiation: Secondary | ICD-10-CM | POA: Diagnosis not present

## 2024-07-15 DIAGNOSIS — Z90710 Acquired absence of both cervix and uterus: Secondary | ICD-10-CM | POA: Insufficient documentation

## 2024-07-15 DIAGNOSIS — D0511 Intraductal carcinoma in situ of right breast: Secondary | ICD-10-CM | POA: Diagnosis not present

## 2024-07-15 DIAGNOSIS — Z803 Family history of malignant neoplasm of breast: Secondary | ICD-10-CM | POA: Diagnosis not present

## 2024-07-15 NOTE — Progress Notes (Signed)
 [facility] Cancer Follow up:    Levora Reyes SAUNDERS, MD [phone] [place] [address]   DIAGNOSIS:  Cancer Staging  Ductal carcinoma in situ (DCIS) of right breast Staging form: Breast, AJCC 8th Edition - Clinical stage from 07/10/2019: Stage 0 (cTis (DCIS), cN0, cM0, ER+, PR+, HER2: Not Assessed) - Signed by Odean Potts, MD on 07/10/2019 Stage prefix: Initial diagnosis Nuclear grade: G2 Stage used in treatment planning: Yes National guidelines used in treatment planning: Yes Type of national guideline used in treatment planning: NCCN - Pathologic stage from 08/09/2019: Stage 0 (pTis (DCIS), pN0, cM0) - Unsigned Stage prefix: Initial diagnosis    SUMMARY OF ONCOLOGIC HISTORY: Oncology History  Ductal carcinoma in situ (DCIS) of right breast  07/04/2019 Initial Diagnosis   Routine screening mammogram detected 1.6cm mass in the right breast at the 9 o'clock position, loosely group calcifications in the upper right breast spanning 3.6cm, and no axillary adenopathy. Biopsy showed intermediate grade DCIS, ER 100%, PR 100%.    07/10/2019 Cancer Staging   Staging form: Breast, AJCC 8th Edition - Clinical stage from 07/10/2019: Stage 0 (cTis (DCIS), cN0, cM0, ER+, PR+, HER2: Not Assessed) - Signed by Odean Potts, MD on 07/10/2019   08/09/2019 Surgery   Right lumpectomy [facility]) [phone]): multifocal low grade DCIS spanning 1.4cm and 0.9cm, clear margins.    09/09/2019 - 10/04/2019 Radiation Therapy   The patient initially received a dose of 42.56 Gy in 16 fractions to the breast using whole-breast tangent fields. This was delivered using a 3-D conformal technique. The patient then received a boost to the seroma. This delivered an additional 8 Gy in 73fractions using a 3 field photon technique due to the depth of the seroma. The total dose was 50.56 Gy.   10/2019 - 10/2024 Anti-estrogen oral therapy   Tamoxifen  daily     CURRENT THERAPY: Tamoxifen   INTERVAL  HISTORY:  Discussed the use of AI scribe software for clinical note transcription with the patient, who gave verbal consent to proceed.  History of Present Illness Susan Miles is a 78 year old female who presents for follow-up and completion of tamoxifen  therapy.  She was diagnosed with stage zero right-sided breast cancer in July 2020 and underwent a lumpectomy followed by adjuvant radiation therapy. She is currently on tamoxifen , scheduled to complete in November 2025.  Her most recent mammogram on July 14, 2023, showed no evidence of malignancy and breast density category A. Her next mammogram is scheduled for July 22, 2024. She reports no new breast changes or concerning symptoms since her last visit.     Patient Active Problem List   Diagnosis Date Noted   Cellulitis and abscess of trunk 07/29/2022   Family history of breast cancer    Ductal carcinoma in situ (DCIS) of right breast 07/04/2019   Hyperlipidemia 12/21/2017   Breast asymmetry 05/01/2015   Cataract, nuclear 05/24/2012   Benign hypertension 02/10/2012   Type 2 diabetes mellitus without complications (HCC) 02/10/2012    is allergic to ace inhibitors.  MEDICAL HISTORY: Past Medical History:  Diagnosis Date   Breast cancer (HCC)    Cancer (HCC)    right breast cancer   Diabetes mellitus without complication (HCC)    Family history of breast cancer    Hypertension    Personal history of radiation therapy    Sleep apnea    does not use CPAP    SURGICAL HISTORY: Past Surgical History:  Procedure Laterality  Date   ABDOMINAL HYSTERECTOMY     BREAST LUMPECTOMY     BREAST LUMPECTOMY WITH RADIOACTIVE SEED LOCALIZATION Right 08/09/2019   Procedure: RIGHT BREAST LUMPECTOMY X 2 WITH RADIOACTIVE SEED LOCALIZATION (3 SEEDS);  Surgeon: Ethyl Lenis, MD;  Location: [facility];  Service: General;  Laterality: Right;    SOCIAL HISTORY: Social History   Socioeconomic History   Marital status:  Married    Spouse name: Not on file   Number of children: Not on file   Years of education: Not on file   Highest education level: Not on file  Occupational History   Not on file  Tobacco Use   Smoking status: Never   Smokeless tobacco: Never  Substance and Sexual Activity   Alcohol use: No   Drug use: No   Sexual activity: Not on file  Other Topics Concern   Not on file  Social History Narrative   f   Social Drivers of Health   Financial Resource Strain: Low Risk  (05/25/2023)   Overall Financial Resource Strain (CARDIA)    Difficulty of Paying Living Expenses: Not very hard  Food Insecurity: No Food Insecurity (05/25/2023)   Hunger Vital Sign    Worried About Running Out of Food in the Last Year: Never true    Ran Out of Food in the Last Year: Never true  Transportation Needs: No Transportation Needs (05/25/2023)   PRAPARE - Administrator, Civil Service (Medical): No    Lack of Transportation (Non-Medical): No  Physical Activity: Inactive (05/25/2023)   Exercise Vital Sign    Days of Exercise per Week: 0 days    Minutes of Exercise per Session: 0 min  Stress: No Stress Concern Present (05/25/2023)   [employer] of Occupational Health - Occupational Stress Questionnaire    Feeling of Stress : Not at all  Social Connections: Socially Integrated (05/25/2023)   Social Connection and Isolation Panel    Frequency of Communication with Friends and Family: Twice a week    Frequency of Social Gatherings with Friends and Family: More than three times a week    Attends Religious Services: More than 4 times per year    Active Member of [employer] or Organizations: Yes    Attends Engineer, structural: More than 4 times per year    Marital Status: Married  Catering manager Violence: Not At Risk (05/25/2023)   Humiliation, Afraid, Rape, and Kick questionnaire    Fear of Current or Ex-Partner: No    Emotionally Abused: No    Physically Abused: No    Sexually  Abused: No    FAMILY HISTORY: Family History  Problem Relation Age of Onset   Hypertension Mother    Heart disease Father    Breast cancer Sister        diagnosed late 16s   Breast cancer Sister        diagnosed 38s   Alcohol abuse Daughter     Review of Systems  Constitutional:  Negative for appetite change, chills, fatigue, fever and unexpected weight change.  HENT:   Negative for hearing loss, lump/mass and trouble swallowing.   Eyes:  Negative for eye problems and icterus.  Respiratory:  Negative for chest tightness, cough and shortness of breath.   Cardiovascular:  Negative for chest pain, leg swelling and palpitations.  Gastrointestinal:  Negative for abdominal distention, abdominal pain, constipation, diarrhea, nausea and vomiting.  Endocrine: Negative for hot flashes.  Genitourinary:  Negative for  difficulty urinating.   Musculoskeletal:  Negative for arthralgias.  Skin:  Negative for itching and rash.  Neurological:  Negative for dizziness, extremity weakness, headaches and numbness.  Hematological:  Negative for adenopathy. Does not bruise/bleed easily.  Psychiatric/Behavioral:  Negative for depression. The patient is not nervous/anxious.       PHYSICAL EXAMINATION   Onc Performance Status - 07/15/24 1113       ECOG Perf Status   ECOG Perf Status Restricted in physically strenuous activity but ambulatory and able to carry out work of a light or sedentary nature, e.g., light house work, office work      KPS SCALE   KPS % SCORE Able to carry on normal activity, minor s/s of disease          Vitals:   07/15/24 1107  BP: 134/72  Pulse: 87  Resp: 18  Temp: 98.2 F (36.8 C)  SpO2: 100%    Physical Exam Constitutional:      General: She is not in acute distress.    Appearance: Normal appearance. She is not toxic-appearing.  HENT:     Head: Normocephalic and atraumatic.     Mouth/Throat:     Mouth: Mucous membranes are moist.     Pharynx: Oropharynx  is clear. No oropharyngeal exudate or posterior oropharyngeal erythema.  Eyes:     General: No scleral icterus. Cardiovascular:     Rate and Rhythm: Normal rate and regular rhythm.     Pulses: Normal pulses.     Heart sounds: Normal heart sounds.  Pulmonary:     Effort: Pulmonary effort is normal.     Breath sounds: Normal breath sounds.  Chest:     Comments: Right breast s/p lumpectomy and radiation, no sign of local recurrence, left breast benign.   Abdominal:     General: Abdomen is flat. Bowel sounds are normal. There is no distension.     Palpations: Abdomen is soft.     Tenderness: There is no abdominal tenderness.  Musculoskeletal:        General: No swelling.     Cervical back: Neck supple.  Lymphadenopathy:     Cervical: No cervical adenopathy.     Upper Body:     Right upper body: No supraclavicular or axillary adenopathy.     Left upper body: No supraclavicular or axillary adenopathy.  Skin:    General: Skin is warm and dry.     Findings: No rash.  Neurological:     General: No focal deficit present.     Mental Status: She is alert.  Psychiatric:        Mood and Affect: Mood normal.        Behavior: Behavior normal.     ASSESSMENT and THERAPY PLAN:   Assessment and Plan Assessment & Plan Right breast cancer status post lumpectomy and adjuvant therapy Stage 0 breast cancer post-lumpectomy and radiation, on tamoxifen  until November 2025. Recent mammogram negative for malignancy. No signs of recurrence. - Discontinue tamoxifen  in November 2025. - Continue annual mammograms, next on July 22, 2024. - Encouraged 150 minutes of weekly exercise to reduce mortality risk. - Continue fruit and vegetable intake - RTC in 1-year for continued long-term follow-up.   All questions were answered. The patient knows to call the clinic with any problems, questions or concerns. We can certainly see the patient much sooner if necessary.  Total encounter time:20 minutes*in  face-to-face visit time, chart review, lab review, care coordination, order entry, and documentation of  the encounter time.    Morna Kendall, NP 07/15/24 11:19 AM Medical Oncology and Hematology [facility] [place] [address] [place], [place] [address] Tel. [phone]    Fax. [phone]  *Total Encounter Time as defined by the Centers for Medicare and Medicaid Services includes, in addition to the face-to-face time of a patient visit (documented in the note above) non-face-to-face time: obtaining and reviewing outside history, ordering and reviewing medications, tests or procedures, care coordination (communications with other health care professionals or caregivers) and documentation in the medical record.

## 2024-07-15 NOTE — Patient Instructions (Signed)
 Stop Tamoxifen  on 11/03/2024.

## 2024-07-22 ENCOUNTER — Ambulatory Visit
Admission: RE | Admit: 2024-07-22 | Discharge: 2024-07-22 | Disposition: A | Discharge status: Home / Self Care | Source: Ambulatory Visit | Attending: Family Medicine | Admitting: Family Medicine

## 2024-07-22 DIAGNOSIS — Z1231 Encounter for screening mammogram for malignant neoplasm of breast: Secondary | ICD-10-CM

## 2024-07-25 ENCOUNTER — Telehealth: Payer: Self-pay

## 2024-07-25 NOTE — Telephone Encounter (Signed)
 Received 4 boxes of 2mg /33mL Ozempic at the office. Called patient and LM informing her her prescription is at the office and ready for pick up Placed in the medication fridge

## 2024-07-29 NOTE — Telephone Encounter (Signed)
 FYI patient coming tomorrow to receive medication

## 2024-07-31 ENCOUNTER — Ambulatory Visit (INDEPENDENT_AMBULATORY_CARE_PROVIDER_SITE_OTHER): Payer: Medicare Other | Admitting: *Deleted

## 2024-07-31 VITALS — Ht 64.0 in | Wt 175.0 lb

## 2024-07-31 DIAGNOSIS — Z0000 Encounter for general adult medical examination without abnormal findings: Secondary | ICD-10-CM | POA: Diagnosis not present

## 2024-07-31 NOTE — Patient Instructions (Signed)
 Susan Miles , Thank you for taking time to come for your Medicare Wellness Visit. I appreciate your ongoing commitment to your health goals. Please review the following plan we discussed and let me know if I can assist you in the future.   Screening recommendations/referrals: Colonoscopy: no longer required Mammogram: up to date Bone Density: Education provided Recommended yearly ophthalmology/optometry visit for glaucoma screening and checkup Recommended yearly dental visit for hygiene and checkup  Vaccinations: Influenza vaccine: Education provided Pneumococcal vaccine: Education provided Tdap vaccine: Education provided Shingles vaccine:up to date       Preventive Care 65 Years and Older, Female Preventive care refers to lifestyle choices and visits with your health care provider that can promote health and wellness. What does preventive care include? A yearly physical exam. This is also called an annual well check. Dental exams once or twice a year. Routine eye exams. Ask your health care provider how often you should have your eyes checked. Personal lifestyle choices, including: Daily care of your teeth and gums. Regular physical activity. Eating a healthy diet. Avoiding tobacco and drug use. Limiting alcohol use. Practicing safe sex. Taking low-dose aspirin every day. Taking vitamin and mineral supplements as recommended by your health care provider. What happens during an annual well check? The services and screenings done by your health care provider during your annual well check will depend on your age, overall health, lifestyle risk factors, and family history of disease. Counseling  Your health care provider may ask you questions about your: Alcohol use. Tobacco use. Drug use. Emotional well-being. Home and relationship well-being. Sexual activity. Eating habits. History of falls. Memory and ability to understand (cognition). Work and work  Astronomer. Reproductive health. Screening  You may have the following tests or measurements: Height, weight, and BMI. Blood pressure. Lipid and cholesterol levels. These may be checked every 5 years, or more frequently if you are over 43 years old. Skin check. Lung cancer screening. You may have this screening every year starting at age 54 if you have a 30-pack-year history of smoking and currently smoke or have quit within the past 15 years. Fecal occult blood test (FOBT) of the stool. You may have this test every year starting at age 31. Flexible sigmoidoscopy or colonoscopy. You may have a sigmoidoscopy every 5 years or a colonoscopy every 10 years starting at age 28. Hepatitis C blood test. Hepatitis B blood test. Sexually transmitted disease (STD) testing. Diabetes screening. This is done by checking your blood sugar (glucose) after you have not eaten for a while (fasting). You may have this done every 1-3 years. Bone density scan. This is done to screen for osteoporosis. You may have this done starting at age 38. Mammogram. This may be done every 1-2 years. Talk to your health care provider about how often you should have regular mammograms. Talk with your health care provider about your test results, treatment options, and if necessary, the need for more tests. Vaccines  Your health care provider may recommend certain vaccines, such as: Influenza vaccine. This is recommended every year. Tetanus, diphtheria, and acellular pertussis (Tdap, Td) vaccine. You may need a Td booster every 10 years. Zoster vaccine. You may need this after age 40. Pneumococcal 13-valent conjugate (PCV13) vaccine. One dose is recommended after age 46. Pneumococcal polysaccharide (PPSV23) vaccine. One dose is recommended after age 45. Talk to your health care provider about which screenings and vaccines you need and how often you need them. This information is not intended  to replace advice given to you by  your health care provider. Make sure you discuss any questions you have with your health care provider. Document Released: 12/18/2015 Document Revised: 08/10/2016 Document Reviewed: 09/22/2015 Elsevier Interactive Patient Education  2017 [employer].  Fall Prevention in the Home Falls can cause injuries. They can happen to people of all ages. There are many things you can do to make your home safe and to help prevent falls. What can I do on the outside of my home? Regularly fix the edges of walkways and driveways and fix any cracks. Remove anything that might make you trip as you walk through a door, such as a raised step or threshold. Trim any bushes or trees on the path to your home. Use bright outdoor lighting. Clear any walking paths of anything that might make someone trip, such as rocks or tools. Regularly check to see if handrails are loose or broken. Make sure that both sides of any steps have handrails. Any raised decks and porches should have guardrails on the edges. Have any leaves, snow, or ice cleared regularly. Use sand or salt on walking paths during winter. Clean up any spills in your garage right away. This includes oil or grease spills. What can I do in the bathroom? Use night lights. Install grab bars by the toilet and in the tub and shower. Do not use towel bars as grab bars. Use non-skid mats or decals in the tub or shower. If you need to sit down in the shower, use a plastic, non-slip stool. Keep the floor dry. Clean up any water that spills on the floor as soon as it happens. Remove soap buildup in the tub or shower regularly. Attach bath mats securely with double-sided non-slip rug tape. Do not have throw rugs and other things on the floor that can make you trip. What can I do in the bedroom? Use night lights. Make sure that you have a light by your bed that is easy to reach. Do not use any sheets or blankets that are too big for your bed. They should not hang  down onto the floor. Have a firm chair that has side arms. You can use this for support while you get dressed. Do not have throw rugs and other things on the floor that can make you trip. What can I do in the kitchen? Clean up any spills right away. Avoid walking on wet floors. Keep items that you use a lot in easy-to-reach places. If you need to reach something above you, use a strong step stool that has a grab bar. Keep electrical cords out of the way. Do not use floor polish or wax that makes floors slippery. If you must use wax, use non-skid floor wax. Do not have throw rugs and other things on the floor that can make you trip. What can I do with my stairs? Do not leave any items on the stairs. Make sure that there are handrails on both sides of the stairs and use them. Fix handrails that are broken or loose. Make sure that handrails are as long as the stairways. Check any carpeting to make sure that it is firmly attached to the stairs. Fix any carpet that is loose or worn. Avoid having throw rugs at the top or bottom of the stairs. If you do have throw rugs, attach them to the floor with carpet tape. Make sure that you have a light switch at the top of the stairs and  the bottom of the stairs. If you do not have them, ask someone to add them for you. What else can I do to help prevent falls? Wear shoes that: Do not have high heels. Have rubber bottoms. Are comfortable and fit you well. Are closed at the toe. Do not wear sandals. If you use a stepladder: Make sure that it is fully opened. Do not climb a closed stepladder. Make sure that both sides of the stepladder are locked into place. Ask someone to hold it for you, if possible. Clearly mark and make sure that you can see: Any grab bars or handrails. First and last steps. Where the edge of each step is. Use tools that help you move around (mobility aids) if they are needed. These  include: Canes. Walkers. Scooters. Crutches. Turn on the lights when you go into a dark area. Replace any light bulbs as soon as they burn out. Set up your furniture so you have a clear path. Avoid moving your furniture around. If any of your floors are uneven, fix them. If there are any pets around you, be aware of where they are. Review your medicines with your doctor. Some medicines can make you feel dizzy. This can increase your chance of falling. Ask your doctor what other things that you can do to help prevent falls. This information is not intended to replace advice given to you by your health care provider. Make sure you discuss any questions you have with your health care provider. Document Released: 09/17/2009 Document Revised: 04/28/2016 Document Reviewed: 12/26/2014 Elsevier Interactive Patient Education  2017 [employer].

## 2024-07-31 NOTE — Progress Notes (Signed)
 Subjective:   Susan Miles is a 78 y.o. female who presents for Medicare Annual (Subsequent) preventive examination.  Visit Complete: Virtual I connected with  Susan Miles on 07/31/24 by a audio enabled telemedicine application and verified that I am speaking with the correct person using two identifiers.  Patient Location: Home  Provider Location: Home Office  I discussed the limitations of evaluation and management by telemedicine. The patient expressed understanding and agreed to proceed.  Vital Signs: Because this visit was a virtual/telehealth visit, some criteria may be missing or patient reported. Any vitals not documented were not able to be obtained and vitals that have been documented are patient reported.   Cardiac Risk Factors include: diabetes mellitus;advanced age (>60men, >76 women);family history of premature cardiovascular disease;hypertension;obesity (BMI >30kg/m2)     Objective:    Today's Vitals   07/31/24 1058  Weight: 175 lb (79.4 kg)  Height: 5' 4 (1.626 m)   Body mass index is 30.04 kg/m.     07/31/2024   10:55 AM 07/15/2024   11:12 AM 05/25/2023   11:35 AM 05/19/2022    9:13 AM 08/27/2019   10:05 AM 08/09/2019   10:49 AM 08/02/2019    3:55 PM  Advanced Directives  Does Patient Have a Medical Advance Directive? No No No Yes No No No  Does patient want to make changes to medical advance directive?    No - Patient declined     Would patient like information on creating a medical advance directive? No - Patient declined  No - Patient declined   No - Patient declined No - Patient declined    Current Medications (verified) Outpatient Encounter Medications as of 07/31/2024  Medication Sig   acetaminophen  (TYLENOL ) 500 MG tablet Take 500 mg by mouth every 6 (six) hours as needed for mild pain.   amLODipine  (NORVASC ) 10 MG tablet Take 1 tablet (10 mg total) by mouth daily.   aspirin 81 MG tablet Take 81 mg by mouth daily.   atorvastatin  (LIPITOR)  20 MG tablet TAKE 1 TABLET BY MOUTH DAILY AT 6PM   Blood Glucose Monitoring Suppl (LDR BLOOD GLUCOSE TRUETEST) w/Device KIT 1 Device by Does not apply route daily. Test blood sugar once daily. Dx: E11.9   cloNIDine  (CATAPRES ) 0.2 MG tablet TAKE 1 TABLET BY MOUTH 2 TIMES DAILY.   dapagliflozin  propanediol (FARXIGA ) 10 MG TABS tablet Take 1 tablet (10 mg total) by mouth daily before breakfast.   fluticasone  (FLONASE ) 50 MCG/ACT nasal spray Place 1-2 sprays into both nostrils daily.   glucose blood (ACCU-CHEK AVIVA PLUS) test strip Use as instructed   ipratropium (ATROVENT ) 0.06 % nasal spray Place 1-2 sprays into both nostrils 4 (four) times daily. As needed for nasal congestion   Lancets MISC Test blood sugar once daily. Dx: E11.9   losartan  (COZAAR ) 100 MG tablet Take 1 tablet (100 mg total) by mouth daily.   metFORMIN  (GLUCOPHAGE ) 1000 MG tablet Take 1 tablet (1,000 mg total) by mouth 2 (two) times daily with a meal.   metoprolol  (TOPROL -XL) 200 MG 24 hr tablet Take 1 tablet (200 mg total) by mouth daily.   Semaglutide, 1 MG/DOSE, 4 MG/3ML SOPN Inject 1 mg as directed once a week.   tamoxifen  (NOLVADEX ) 20 MG tablet TAKE 1 TABLET BY MOUTH EVERY DAY   No facility-administered encounter medications on file as of 07/31/2024.    Allergies (verified) Ace inhibitors   History: Past Medical History:  Diagnosis Date   Breast cancer (  HCC)    Cancer (HCC)    right breast cancer   Diabetes mellitus without complication (HCC)    Family history of breast cancer    Hypertension    Personal history of radiation therapy    Sleep apnea    does not use CPAP   Past Surgical History:  Procedure Laterality Date   ABDOMINAL HYSTERECTOMY     BREAST LUMPECTOMY     BREAST LUMPECTOMY WITH RADIOACTIVE SEED LOCALIZATION Right 08/09/2019   Procedure: RIGHT BREAST LUMPECTOMY X 2 WITH RADIOACTIVE SEED LOCALIZATION (3 SEEDS);  Surgeon: Ethyl Lenis, MD;  Location: [facility];  Service: General;   Laterality: Right;   Family History  Problem Relation Age of Onset   Hypertension Mother    Heart disease Father    Breast cancer Sister        diagnosed late 17s   Breast cancer Sister        diagnosed 36s   Alcohol abuse Daughter    Breast cancer Niece 75   Breast cancer Niece 84   Social History   Socioeconomic History   Marital status: Married    Spouse name: Not on file   Number of children: Not on file   Years of education: Not on file   Highest education level: Not on file  Occupational History   Not on file  Tobacco Use   Smoking status: Never   Smokeless tobacco: Never  Substance and Sexual Activity   Alcohol use: No   Drug use: No   Sexual activity: Not on file  Other Topics Concern   Not on file  Social History Narrative   f   Social Drivers of Health   Financial Resource Strain: Low Risk  (07/31/2024)   Overall Financial Resource Strain (CARDIA)    Difficulty of Paying Living Expenses: Not hard at all  Food Insecurity: No Food Insecurity (07/31/2024)   Hunger Vital Sign    Worried About Running Out of Food in the Last Year: Never true    Ran Out of Food in the Last Year: Never true  Transportation Needs: No Transportation Needs (07/31/2024)   PRAPARE - Administrator, Civil Service (Medical): No    Lack of Transportation (Non-Medical): No  Physical Activity: Insufficiently Active (07/31/2024)   Exercise Vital Sign    Days of Exercise per Week: 3 days    Minutes of Exercise per Session: 20 min  Stress: No Stress Concern Present (07/31/2024)   [employer] of Occupational Health - Occupational Stress Questionnaire    Feeling of Stress: Not at all  Social Connections: Socially Integrated (07/31/2024)   Social Connection and Isolation Panel    Frequency of Communication with Friends and Family: More than three times a week    Frequency of Social Gatherings with Friends and Family: Twice a week    Attends Religious Services: More than 4  times per year    Active Member of [employer] or Organizations: Yes    Attends Engineer, structural: More than 4 times per year    Marital Status: Married    Tobacco Counseling Counseling given: Not Answered   Clinical Intake:  Pre-visit preparation completed: Yes  Pain : No/denies pain     Diabetes: Yes CBG done?: No Did pt. bring in CBG monitor from home?: No  How often do you need to have someone help you when you read instructions, pamphlets, or other written materials from your doctor or pharmacy?: 1 -  Never  Interpreter Needed?: No  Information entered by :: Mliss Graff LPN   Activities of Daily Living    07/31/2024   10:58 AM  In your present state of health, do you have any difficulty performing the following activities:  Hearing? 0  Vision? 0  Difficulty concentrating or making decisions? 1  Walking or climbing stairs? 0  Dressing or bathing? 0  Doing errands, shopping? 0  Preparing Food and eating ? N  Using the Toilet? N  In the past six months, have you accidently leaked urine? N  Do you have problems with loss of bowel control? N  Managing your Medications? N  Managing your Finances? N  Housekeeping or managing your Housekeeping? N    Patient Care Team: Levora Reyes SAUNDERS, MD as PCP - General (Family Medicine) Paul Barrio, OD as Referring Physician (Optometry) Odean Potts, MD as Consulting Physician (Hematology and Oncology) Dewey Rush, MD as Consulting Physician (Radiation Oncology) Ethyl Lenis, MD as Consulting Physician (General Surgery)  Indicate any recent Medical Services you may have received from other than [facility] providers in the past year (date may be approximate).     Assessment:   This is a routine wellness examination for Analeya.  Hearing/Vision screen Hearing Screening - Comments:: No trouble hearing Vision Screening - Comments:: [place] Up to date   Goals Addressed             This Visit's Progress     Weight (lb) < 200 lb (90.7 kg)   175 lb (79.4 kg)      Depression Screen    07/31/2024   11:00 AM 07/15/2024   11:13 AM 05/22/2024   10:00 AM 10/25/2023   10:26 AM 05/25/2023   11:40 AM 05/03/2023   10:23 AM 02/02/2023   10:18 AM  PHQ 2/9 Scores  PHQ - 2 Score 0 0 0 1 0 2 2  PHQ- 9 Score 0  1 5 0 5 4    Fall Risk    07/31/2024   10:54 AM 05/22/2024   10:01 AM 10/25/2023   10:26 AM 05/03/2023   10:24 AM 02/02/2023   10:17 AM  Fall Risk   Falls in the past year? 0 0 1 0 0  Number falls in past yr: 0 0 0 0 0  Injury with Fall? 0 0 0 0 0  Risk for fall due to :   No Fall Risks;History of fall(s) No Fall Risks No Fall Risks  Follow up Falls evaluation completed;Education provided;Falls prevention discussed  Falls evaluation completed Falls evaluation completed Falls evaluation completed    MEDICARE RISK AT HOME: Medicare Risk at Home Any stairs in or around the home?: No If so, are there any without handrails?: No Home free of loose throw rugs in walkways, pet beds, electrical cords, etc?: Yes Adequate lighting in your home to reduce risk of falls?: Yes Life alert?: No Use of a cane, walker or w/c?: No Grab bars in the bathroom?: Yes Shower chair or bench in shower?: Yes Elevated toilet seat or a handicapped toilet?: Yes  TIMED UP AND GO:  Was the test performed?  No    Cognitive Function:        07/31/2024   10:56 AM 05/25/2023   11:37 AM 08/03/2017    8:51 AM  6CIT Screen  What Year? 0 points 0 points 0 points  What month? 0 points 0 points 0 points  What time? 0 points 0 points 0 points  Count back  from 20 0 points 0 points 0 points  Months in reverse 0 points 0 points 0 points  Repeat phrase 2 points 0 points 2 points  Total Score 2 points 0 points 2 points    Immunizations Immunization History  Administered Date(s) Administered   DTaP 02/03/2007   Fluad Quad(high Dose 65+) 08/12/2022   INFLUENZA, HIGH DOSE SEASONAL PF 09/22/2018, 09/06/2019, 09/26/2023    Influenza Split 09/17/2012   Influenza,inj,Quad PF,6+ Mos 09/30/2013, 07/28/2014, 09/21/2015, 09/22/2016, 08/03/2017   Influenza-Unspecified 08/19/2020, 08/12/2022   PFIZER(Purple Top)SARS-COV-2 Vaccination 01/10/2020, 01/31/2020, 10/03/2020   Pfizer(Comirnaty)Fall Seasonal Vaccine 12 years and older 12/12/2022, 09/26/2023   Pneumococcal Conjugate-13 12/05/2010, 10/27/2014   Pneumococcal Polysaccharide-23 09/21/2015   Zoster Recombinant(Shingrix) 11/03/2022, 12/06/2022   Zoster, Live 03/19/2014    TDAP status: Due, Education has been provided regarding the importance of this vaccine. Advised may receive this vaccine at local pharmacy or Health Dept. Aware to provide a copy of the vaccination record if obtained from local pharmacy or Health Dept. Verbalized acceptance and understanding.  Flu Vaccine status: Due, Education has been provided regarding the importance of this vaccine. Advised may receive this vaccine at local pharmacy or Health Dept. Aware to provide a copy of the vaccination record if obtained from local pharmacy or Health Dept. Verbalized acceptance and understanding.  Pneumococcal vaccine status: Up to date  Covid-19 vaccine status: Information provided on how to obtain vaccines.   Qualifies for Shingles Vaccine? No   Zostavax completed Yes   Shingrix Completed?: Yes  Screening Tests Health Maintenance  Topic Date Due   DTaP/Tdap/Td (2 - Tdap) 02/02/2017   OPHTHALMOLOGY EXAM  02/22/2024   COVID-19 Vaccine (6 - Pfizer risk 2024-25 season) 03/26/2024   INFLUENZA VACCINE  07/05/2024   HEMOGLOBIN A1C  11/21/2024   Diabetic kidney evaluation - eGFR measurement  05/22/2025   Diabetic kidney evaluation - Urine ACR  05/22/2025   FOOT EXAM  05/22/2025   Medicare Annual Wellness (AWV)  07/31/2025   Pneumococcal Vaccine: 50+ Years  Completed   DEXA SCAN  Completed   Hepatitis C Screening  Completed   Zoster Vaccines- Shingrix  Completed   HPV VACCINES  Aged Out    Meningococcal B Vaccine  Aged Out   Colonoscopy  Discontinued    Health Maintenance  Health Maintenance Due  Topic Date Due   DTaP/Tdap/Td (2 - Tdap) 02/02/2017   OPHTHALMOLOGY EXAM  02/22/2024   COVID-19 Vaccine (6 - Pfizer risk 2024-25 season) 03/26/2024   INFLUENZA VACCINE  07/05/2024    Colorectal cancer screening: No longer required.   Mammogram status: Completed  . Repeat every year  Bone Density status: Completed 2016. Results reflect: Bone density results: NORMAL. Repeat every 5 years.  Lung Cancer Screening: (Low Dose CT Chest recommended if Age 32-80 years, 20 pack-year currently smoking OR have quit w/in 15years.) does not qualify.   Lung Cancer Screening Referral:   Additional Screening:  Hepatitis C Screening: does not qualify; Completed 2017  Vision Screening: Recommended annual ophthalmology exams for early detection of glaucoma and other disorders of the eye. Is the patient up to date with their annual eye exam?  Yes  Who is the provider or what is the name of the office in which the patient attends annual eye exams? [facility] If pt is not established with a provider, would they like to be referred to a provider to establish care? No .   Dental Screening: Recommended annual dental exams for proper oral hygiene  Nutrition Risk Assessment:  Has the patient had any N/V/D within the last 2 months?  No  Does the patient have any non-healing wounds?  No  Has the patient had any unintentional weight loss or weight gain?  No   Diabetes:  Is the patient diabetic?  No  If diabetic, was a CBG obtained today?  No  Did the patient bring in their glucometer from home?  No  How often do you monitor your CBG's? 1 x a day.   Financial Strains and Diabetes Management:  Are you having any financial strains with the device, your supplies or your medication? No .  Does the patient want to be seen by Chronic Care Management for management of their diabetes?  No  Would  the patient like to be referred to a Nutritionist or for Diabetic Management?  No   Diabetic Exams:  Diabetic Eye Exam: . Pt has been advised about the importance in completing this exam  Diabetic Foot Exam: . Pt has been advised about the importance in completing this exam.    Community Resource Referral / Chronic Care Management: CRR required this visit?  No   CCM required this visit?  No     Plan:     I have personally reviewed and noted the following in the patient's chart:   Medical and social history Use of alcohol, tobacco or illicit drugs  Current medications and supplements including opioid prescriptions. Patient is not currently taking opioid prescriptions. Functional ability and status Nutritional status Physical activity Advanced directives List of other physicians Hospitalizations, surgeries, and ER visits in previous 12 months Vitals Screenings to include cognitive, depression, and falls Referrals and appointments  In addition, I have reviewed and discussed with patient certain preventive protocols, quality metrics, and best practice recommendations. A written personalized care plan for preventive services as well as general preventive health recommendations were provided to patient.     Mliss Graff, LPN   [id number]   After Visit Summary: (MyChart) Due to this being a telephonic visit, the after visit summary with patients personalized plan was offered to patient via MyChart   Nurse Notes:

## 2024-07-31 NOTE — Telephone Encounter (Unsigned)
 Copied from CRM #[id number]. Topic: Clinical - Medication Question >> Jul 30, 2024  4:24 PM Susan Miles wrote: Reason for CRM: Patient is calling in for [facility] or the nurse she stated the Ozempic is the wrong MG. Please call the patient back

## 2024-08-10 ENCOUNTER — Other Ambulatory Visit: Payer: Self-pay | Admitting: Family Medicine

## 2024-08-10 DIAGNOSIS — R0981 Nasal congestion: Secondary | ICD-10-CM

## 2024-08-26 ENCOUNTER — Other Ambulatory Visit: Payer: Self-pay

## 2024-08-26 ENCOUNTER — Ambulatory Visit: Admitting: Family Medicine

## 2024-08-26 VITALS — BP 126/76 | HR 89 | Resp 18 | Ht 64.0 in | Wt 173.0 lb

## 2024-08-26 DIAGNOSIS — Z23 Encounter for immunization: Secondary | ICD-10-CM | POA: Diagnosis not present

## 2024-08-26 DIAGNOSIS — I10 Essential (primary) hypertension: Secondary | ICD-10-CM | POA: Diagnosis not present

## 2024-08-26 DIAGNOSIS — E1129 Type 2 diabetes mellitus with other diabetic kidney complication: Secondary | ICD-10-CM

## 2024-08-26 DIAGNOSIS — E782 Mixed hyperlipidemia: Secondary | ICD-10-CM

## 2024-08-26 DIAGNOSIS — Z7985 Long-term (current) use of injectable non-insulin antidiabetic drugs: Secondary | ICD-10-CM | POA: Diagnosis not present

## 2024-08-26 DIAGNOSIS — R809 Proteinuria, unspecified: Secondary | ICD-10-CM

## 2024-08-26 LAB — COMPREHENSIVE METABOLIC PANEL WITH GFR
ALT: 11 U/L (ref 0–35)
AST: 14 U/L (ref 0–37)
Albumin: 4 g/dL (ref 3.5–5.2)
Alkaline Phosphatase: 44 U/L (ref 39–117)
BUN: 15 mg/dL (ref 6–23)
CO2: 25 meq/L (ref 19–32)
Calcium: 10.7 mg/dL — ABNORMAL HIGH (ref 8.4–10.5)
Chloride: 102 meq/L (ref 96–112)
Creatinine, Ser: 0.92 mg/dL (ref 0.40–1.20)
GFR: 59.97 mL/min — ABNORMAL LOW (ref >60.00)
Glucose, Bld: 106 mg/dL — ABNORMAL HIGH (ref 70–99)
Potassium: 4 meq/L (ref 3.5–5.1)
Sodium: 135 meq/L (ref 135–145)
Total Bilirubin: 0.6 mg/dL (ref 0.2–1.2)
Total Protein: 7.3 g/dL (ref 6.0–8.3)

## 2024-08-26 LAB — HEMOGLOBIN A1C: Hgb A1c MFr Bld: 6.8 % — ABNORMAL HIGH (ref 4.6–6.5)

## 2024-08-26 MED ORDER — CLONIDINE HCL 0.2 MG PO TABS: 0.2000 mg | ORAL_TABLET | Freq: Two times a day (BID) | ORAL | 1 refills | Status: DC

## 2024-08-26 MED ORDER — METOPROLOL SUCCINATE ER 200 MG PO TB24: 200.0000 mg | ORAL_TABLET | Freq: Every day | ORAL | 1 refills | Status: DC

## 2024-08-26 MED ORDER — LOSARTAN POTASSIUM 100 MG PO TABS: 100.0000 mg | ORAL_TABLET | Freq: Every day | ORAL | 1 refills | Status: DC

## 2024-08-26 MED ORDER — METFORMIN HCL 1000 MG PO TABS: 1000.0000 mg | ORAL_TABLET | Freq: Two times a day (BID) | ORAL | 1 refills | Status: DC

## 2024-08-26 MED ORDER — ATORVASTATIN CALCIUM 20 MG PO TABS: ORAL_TABLET | ORAL | 1 refills | Status: DC

## 2024-08-26 MED ORDER — AMLODIPINE BESYLATE 10 MG PO TABS: 10.0000 mg | ORAL_TABLET | Freq: Every day | ORAL | 1 refills | Status: DC

## 2024-08-26 NOTE — Telephone Encounter (Signed)
 Copied from CRM #[id number]. Topic: Clinical - Medication Question >> Aug 26, 2024  3:01 PM Henretta I wrote: Reason for CRM: Patient was in this morning to see Dr. Levora but he forgot to send inher medication cloNIDine  (CATAPRES ) 0.2 MG tablet and her other medications to pharmacy. Patient would like to know if the clonidine  can be sent in today as she is out.  [id number] [place], [place] - [address] [place] [address] Phone: [phone] Fax: [phone] Hours: Not open 24 hours

## 2024-08-26 NOTE — Telephone Encounter (Signed)
 Requested Prescriptions   Pending Prescriptions Disp Refills   cloNIDine  (CATAPRES ) 0.2 MG tablet 180 tablet 1    Sig: Take 1 tablet (0.2 mg total) by mouth 2 (two) times daily.     Date of patient request: 08/26/2024 Last office visit: 08/26/2024 Upcoming visit: 11/27/2024 Date of last refill: 05/13/2024 Last refill amount: 180x 1

## 2024-08-26 NOTE — Progress Notes (Unsigned)
 Subjective:  Patient ID: Susan Miles, female    DOB: 1946/08/01  Age: 78 y.o. MRN: [id number]  CC:  Chief Complaint  Patient presents with   Medical Management of Chronic Issues   Follow-up    HPI Susan Miles presents for   Diabetes: Complicated by hyperglycemia, microalbuminuria, CKD.  Treated with Ozempic, Farxiga , metformin .  Denies mycotic or UTI symptoms with use of SGLT2.  Denies new side effects with Ozempic including no new nausea vomiting abdominal pain or neck swelling.  She is on ARB with losartan , statin with Lipitor. On med assistance with Ozempic at [facility], Farxia with [place] [employer] [place] - received 0.25/0.5 dose - 4 pens. Also followed by clinical pharmacist - appt  noted 7/31. On ozempic 1mg  weekly at that time.  Received the lower dose of 0.25/0.5 pens, not 1mg .  Still has 1 month left of 1mg  dose.  No new side effects.  Still on metformin  1000mg  BID - option of every day ER doing if lower A1c under 6.5.    Home readings: up to 142, low 94.  No symptomatic lows Microalbumin: improved ratio in June - 63 to 27.1  Optho, foot exam, pneumovax:Optho appointment in October with Dr. Paul. Hypertension, hyperlipidemia discussed at her June visit.  LDL was 62 at that time.  Hypertension stable.  Agrees to flu vaccine today.   Lab Results  Component Value Date   HGBA1C 7.1 (H) 05/22/2024   HGBA1C 7.2 (H) 02/15/2024   HGBA1C 7.1 (H) 10/25/2023   Lab Results  Component Value Date   MICROALBUR 3.1 (H) 05/22/2024   LDLCALC 62 05/22/2024   CREATININE 1.01 05/22/2024   BP Readings from Last 3 Encounters:  08/26/24 126/76  07/15/24 134/72  05/22/24 108/66     History Patient Active Problem List   Diagnosis Date Noted   Cellulitis and abscess of trunk 07/29/2022   Family history of breast cancer    Ductal carcinoma in situ (DCIS) of right breast 07/04/2019   Hyperlipidemia 12/21/2017   Breast asymmetry 05/01/2015   Cataract, nuclear 05/24/2012    Benign hypertension 02/10/2012   Type 2 diabetes mellitus without complications (HCC) 02/10/2012   Past Medical History:  Diagnosis Date   Breast cancer (HCC)    Cancer (HCC)    right breast cancer   Diabetes mellitus without complication (HCC)    Family history of breast cancer    Hypertension    Personal history of radiation therapy    Sleep apnea    does not use CPAP   Past Surgical History:  Procedure Laterality Date   ABDOMINAL HYSTERECTOMY     BREAST LUMPECTOMY     BREAST LUMPECTOMY WITH RADIOACTIVE SEED LOCALIZATION Right 08/09/2019   Procedure: RIGHT BREAST LUMPECTOMY X 2 WITH RADIOACTIVE SEED LOCALIZATION (3 SEEDS);  Surgeon: Ethyl Lenis, MD;  Location: [facility];  Service: General;  Laterality: Right;   Allergies  Allergen Reactions   Ace Inhibitors Cough   Prior to Admission medications   Medication Sig Start Date End Date Taking? Authorizing Provider  acetaminophen  (TYLENOL ) 500 MG tablet Take 500 mg by mouth every 6 (six) hours as needed for mild pain.    [provider]  amLODipine  (NORVASC ) 10 MG tablet Take 1 tablet (10 mg total) by mouth daily. 05/22/24   Levora Reyes SAUNDERS, MD  aspirin 81 MG tablet Take 81 mg by mouth daily.    [provider]  atorvastatin  (LIPITOR) 20 MG tablet TAKE 1 TABLET  BY MOUTH DAILY AT JEREL 05/22/24   Levora Reyes SAUNDERS, MD  Blood Glucose Monitoring Suppl (LDR BLOOD GLUCOSE TRUETEST) w/Device KIT 1 Device by Does not apply route daily. Test blood sugar once daily. Dx: E11.9 05/13/20   Levora Reyes SAUNDERS, MD  cloNIDine  (CATAPRES ) 0.2 MG tablet TAKE 1 TABLET BY MOUTH 2 TIMES DAILY. 05/13/24   Levora Reyes SAUNDERS, MD  dapagliflozin  propanediol (FARXIGA ) 10 MG TABS tablet Take 1 tablet (10 mg total) by mouth daily before breakfast. 04/02/24   Levora Reyes SAUNDERS, MD  fluticasone  (FLONASE ) 50 MCG/ACT nasal spray Place 1-2 sprays into both nostrils daily. 05/22/24   Levora Reyes SAUNDERS, MD  glucose blood (ACCU-CHEK AVIVA  PLUS) test strip Use as instructed 11/24/23   Levora Reyes SAUNDERS, MD  ipratropium (ATROVENT ) 0.06 % nasal spray PLACE 1-2 SPRAYS INTO BOTH NOSTRILS 4 (FOUR) TIMES DAILY. AS NEEDED FOR NASAL CONGESTION 08/12/24   Levora Reyes SAUNDERS, MD  Lancets MISC Test blood sugar once daily. Dx: E11.9 01/24/18   Levora Reyes SAUNDERS, MD  losartan  (COZAAR ) 100 MG tablet Take 1 tablet (100 mg total) by mouth daily. 05/22/24   Levora Reyes SAUNDERS, MD  metFORMIN  (GLUCOPHAGE ) 1000 MG tablet Take 1 tablet (1,000 mg total) by mouth 2 (two) times daily with a meal. 04/02/24   Levora Reyes SAUNDERS, MD  metoprolol  (TOPROL -XL) 200 MG 24 hr tablet Take 1 tablet (200 mg total) by mouth daily. 05/22/24   Levora Reyes SAUNDERS, MD  Semaglutide, 1 MG/DOSE, 4 MG/3ML SOPN Inject 1 mg as directed once a week. 04/24/24   Levora Reyes SAUNDERS, MD  tamoxifen  (NOLVADEX ) 20 MG tablet TAKE 1 TABLET BY MOUTH EVERY DAY 07/04/24   Odean Potts, MD   Social History   Socioeconomic History   Marital status: Married    Spouse name: Not on file   Number of children: Not on file   Years of education: Not on file   Highest education level: Not on file  Occupational History   Not on file  Tobacco Use   Smoking status: Never   Smokeless tobacco: Never  Substance and Sexual Activity   Alcohol use: No   Drug use: No   Sexual activity: Not on file  Other Topics Concern   Not on file  Social History Narrative   f   Social Drivers of Health   Financial Resource Strain: Low Risk  (07/31/2024)   Overall Financial Resource Strain (CARDIA)    Difficulty of Paying Living Expenses: Not hard at all  Food Insecurity: No Food Insecurity (07/31/2024)   Hunger Vital Sign    Worried About Running Out of Food in the Last Year: Never true    Ran Out of Food in the Last Year: Never true  Transportation Needs: No Transportation Needs (07/31/2024)   PRAPARE - Administrator, Civil Service (Medical): No    Lack of Transportation (Non-Medical): No  Physical  Activity: Insufficiently Active (07/31/2024)   Exercise Vital Sign    Days of Exercise per Week: 3 days    Minutes of Exercise per Session: 20 min  Stress: No Stress Concern Present (07/31/2024)   [employer] of Occupational Health - Occupational Stress Questionnaire    Feeling of Stress: Not at all  Social Connections: Socially Integrated (07/31/2024)   Social Connection and Isolation Panel    Frequency of Communication with Friends and Family: More than three times a week    Frequency of Social Gatherings with Friends and Family: Twice a  week    Attends Religious Services: More than 4 times per year    Active Member of Clubs or Organizations: Yes    Attends Banker Meetings: More than 4 times per year    Marital Status: Married  Catering manager Violence: Not At Risk (07/31/2024)   Humiliation, Afraid, Rape, and Kick questionnaire    Fear of Current or Ex-Partner: No    Emotionally Abused: No    Physically Abused: No    Sexually Abused: No    Review of Systems  Constitutional:  Negative for fatigue and unexpected weight change.  Respiratory:  Negative for chest tightness and shortness of breath.   Cardiovascular:  Negative for chest pain, palpitations and leg swelling.  Gastrointestinal:  Negative for abdominal pain and blood in stool.  Neurological:  Negative for dizziness, syncope, light-headedness and headaches.     Objective:   Vitals:   08/26/24 1050  BP: 126/76  Pulse: 89  Resp: 18  SpO2: 96%  Weight: 173 lb (78.5 kg)  Height: 5' 4 (1.626 m)     Physical Exam Vitals reviewed.  Constitutional:      Appearance: Normal appearance. She is well-developed.  HENT:     Head: Normocephalic and atraumatic.  Eyes:     Conjunctiva/sclera: Conjunctivae normal.     Pupils: Pupils are equal, round, and reactive to light.  Neck:     Vascular: No carotid bruit.  Cardiovascular:     Rate and Rhythm: Normal rate and regular rhythm.     Heart sounds:  Normal heart sounds.  Pulmonary:     Effort: Pulmonary effort is normal.     Breath sounds: Normal breath sounds.  Abdominal:     Palpations: Abdomen is soft. There is no pulsatile mass.     Tenderness: There is no abdominal tenderness.  Musculoskeletal:     Right lower leg: No edema.     Left lower leg: No edema.  Skin:    General: Skin is warm and dry.  Neurological:     Mental Status: She is alert and oriented to person, place, and time.  Psychiatric:        Mood and Affect: Mood normal.        Behavior: Behavior normal.        Assessment & Plan:  Davene Jobin is a 78 y.o. female . Type 2 diabetes mellitus with microalbuminuria, without long-term current use of insulin (HCC) - Plan: Comprehensive metabolic panel with GFR, Hemoglobin A1c  Mixed hyperlipidemia - Plan: Comprehensive metabolic panel with GFR  Essential hypertension - Plan: Comprehensive metabolic panel with GFR  Overall stable glycemic control.  Tolerating current med regimen.  Unfortunately was sent wrong dose of Ozempic.  She does still have some doses left.  Will clarify with pharmacist but may just need to have new prescription sent.  I will hold until I hear further information.  No other med changes at this time.  Check labs as above.  Hypertension stable.  Flu shot received at pharmacy.  No orders of the defined types were placed in this encounter.  Patient Instructions  Thank you for coming in today. No change in medications at this time.  I will check with Tammy to get your dose of Ozempic straightened out.  If there are any concerns on your bloodwork, I will let you know. Take care!     Signed,   Reyes Pines, MD [facility], [facility] 08/26/24 11:11  AM

## 2024-08-26 NOTE — Patient Instructions (Signed)
 Thank you for coming in today. No change in medications at this time.  I will check with Tammy to get your dose of Ozempic straightened out.  If there are any concerns on your bloodwork, I will let you know. Take care!

## 2024-08-26 NOTE — Telephone Encounter (Signed)
 Meds ordered

## 2024-08-27 ENCOUNTER — Encounter: Payer: Self-pay | Admitting: Family Medicine

## 2024-08-30 ENCOUNTER — Ambulatory Visit: Payer: Self-pay | Admitting: Family Medicine

## 2024-09-03 ENCOUNTER — Other Ambulatory Visit: Payer: Self-pay | Admitting: Pharmacist

## 2024-09-03 NOTE — Progress Notes (Signed)
 09/03/2024 Name: Susan Miles MRN: [id number] DOB: Apr 14, 1946  Chief Complaint  Patient presents with   Medication Management   Diabetes    Susan Miles is a 78 y.o. year old female who presented for a telephone visit.   They were referred to the pharmacist by their PCP for assistance in managing diabetes and medication access.    Subjective:  Medication Access/Adherence  Patient is enrolled in medication assistance program for Ozempic with [facility] and Farxiga  with [place] [employer] [place] thru 12/04/2024.    Picked up Ozempic 1mg  weekly and started 05/2024.  Our office sent in dose change form 06/13/2024 for Ozempic 1mg  but in August, [facility] sent refill for Ozempic 0.25/0.5mg  dose. When I initially called [facility] today, I was told that she has reached her max number of fills for 2025. However when I pointed out that it was their mistake for filling the wrong strength in August they agreed to fill Ozempic 1mg  once.   She reports she received delivery from [employer] [place] for Farxiga  10mg  - 90 day supply around 04/28/2024  Current Pharmacy:  [id number] [place], [place] - [address] [place] [address] Phone: [phone] Fax: [phone]  [facility] - [place], [place] - [address] [place] [address] Phone: [phone] Fax: [phone]   Patient reports affordability concerns with their medications: No  - for 2025 getting Farxiga  and Ozempic thru medication assistance programs. Patient reports access/transportation concerns to their pharmacy: No  Patient reports adherence concerns with their medications:  No      Diabetes:  Current medications: Ozempic 1mg  weekly,  metformin  1000mg  twice day; Farxiga  10mg  daily   Patient reports she is tolerating Ozempic. No nausea but does notice better controlled appetite. She reports occasional constipation - not currently taking any over-the-counter meds for  constipation. She report eating a lot of vegetables.   Patient also asks about lowering dose of metformin  in the future.    Weight prior to starting Ozempic = 188 lbs Total weight loss - 15 lbs  Wt Readings from Last 3 Encounters:  08/26/24 173 lb (78.5 kg)  07/31/24 175 lb (79.4 kg)  07/15/24 175 lb 11.2 oz (79.7 kg)    Current glucose readings: 100 to 150  Hypertension:  Current medications: clonidine  0.2mg  twice a day, amlodipine  10mg  daily, metoprolol  XL 200mg  daily, losartan  100mg  daily   BP Readings from Last 3 Encounters:  08/26/24 126/76  07/15/24 134/72  05/22/24 108/66     Objective:  Lab Results  Component Value Date   HGBA1C 6.8 (H) 08/26/2024    Lab Results  Component Value Date   CREATININE 0.92 08/26/2024   BUN 15 08/26/2024   NA 135 08/26/2024   K 4.0 08/26/2024   CL 102 08/26/2024   CO2 25 08/26/2024    Lab Results  Component Value Date   CHOL 114 05/22/2024   HDL 35.40 (L) 05/22/2024   LDLCALC 62 05/22/2024   TRIG 86.0 05/22/2024   CHOLHDL 3 05/22/2024    Medications Reviewed Today     Reviewed by Carla Milling, RPH-CPP (Pharmacist) on 09/03/24 at 1418  Med List Status: <None>   Medication Order Taking? Sig Documenting Provider Last Dose Status Informant  acetaminophen  (TYLENOL ) 500 MG tablet [id number]  Take 500 mg by mouth every 6 (six) hours as needed for mild pain. [provider]  Active Self  amLODipine  (NORVASC ) 10  MG tablet [id number]  Take 1 tablet (10 mg total) by mouth daily. Levora Reyes SAUNDERS, MD  Active   aspirin 81 MG tablet [id number]  Take 81 mg by mouth daily. [provider]  Active Self  atorvastatin  (LIPITOR) 20 MG tablet [id number]  TAKE 1 TABLET BY MOUTH DAILY AT JEREL Levora Reyes SAUNDERS, MD  Active   Blood Glucose Monitoring Suppl (LDR BLOOD GLUCOSE TRUETEST) w/Device KIT [id number]  1 Device by Does not apply route daily. Test blood sugar once daily. Dx: E11.9 Levora Reyes SAUNDERS, MD  Active   cloNIDine   (CATAPRES ) 0.2 MG tablet [id number]  Take 1 tablet (0.2 mg total) by mouth 2 (two) times daily. Levora Reyes SAUNDERS, MD  Active   dapagliflozin  propanediol (FARXIGA ) 10 MG TABS tablet [id number]  Take 1 tablet (10 mg total) by mouth daily before breakfast. Levora Reyes SAUNDERS, MD  Active            Med Note [facility], [facility]   Fri May 03, 2024 11:07 AM) [place] [employer] [place] thru 12/04/2024  fluticasone  (FLONASE ) 50 MCG/ACT nasal spray [id number]  Place 1-2 sprays into both nostrils daily. Levora Reyes SAUNDERS, MD  Active   glucose blood (ACCU-CHEK AVIVA PLUS) test strip [id number]  Use as instructed Levora Reyes SAUNDERS, MD  Active   ipratropium (ATROVENT ) 0.06 % nasal spray [id number]  PLACE 1-2 SPRAYS INTO BOTH NOSTRILS 4 (FOUR) TIMES DAILY. AS NEEDED FOR NASAL CONGESTION Levora Reyes SAUNDERS, MD  Active   Lancets MISC [id number]  Test blood sugar once daily. Dx: E11.9 Levora Reyes SAUNDERS, MD  Active   losartan  (COZAAR ) 100 MG tablet [id number]  Take 1 tablet (100 mg total) by mouth daily. Levora Reyes SAUNDERS, MD  Active   metFORMIN  (GLUCOPHAGE ) 1000 MG tablet [id number]  Take 1 tablet (1,000 mg total) by mouth 2 (two) times daily with a meal. Levora Reyes SAUNDERS, MD  Active   metoprolol  (TOPROL -XL) 200 MG 24 hr tablet [id number]  Take 1 tablet (200 mg total) by mouth daily. Levora Reyes SAUNDERS, MD  Active   Semaglutide, 1 MG/DOSE, 4 MG/3ML NELMA [id number]  Inject 1 mg as directed once a week. Levora Reyes SAUNDERS, MD  Active            Med Note [facility], [place] [facility]   Fri May 03, 2024 11:06 AM) Will start 05/05/2024  tamoxifen  (NOLVADEX ) 20 MG tablet [id number]  TAKE 1 TABLET BY MOUTH EVERY DAY Odean Potts, MD  Active               Assessment/Plan:   Diabetes: A1c improved over the last year but not at goal and home blood glucose has been higher the last month or two.  - Continue Ozempic to 1mg  weekly. Coordinated with [facility] [facility] patient assistance program - they are starting refill for Ozempic 1mg  - should received in 10 to 14  business days or around 09/23/2024. - Continue metformin  1000mg  twice a day and Farxiga  10mg  daily.  - Discussed possibly lowering dose of metformin  in the future - if next A1c if 6.5 or less, could consider change to metformin  ER 1000mg  once daily.  - Continue to check blood glucose daily - Reminded to get yearly eye exam - patient states she is due next month in October.   Hypertension: - continue losartan , clonidine , amlodipine , metoprolol   Medication Management / Access: - Reviewed medication list and refill history - Explained that it is likely she will not be able to continue to get Ozempic  from [employer] [facility] patient assistance program. I did screen for LIS (based on 2025 cut offs though). Reminded patient to look at deductible and copay when she is selecting a Medicare plan for 2026.    Follow Up Plan - 2 to 4 weeks to check on medication assistance program / 2026 Medicare coverage and possible patient assistance program.   Madelin Ray, PharmD Clinical Pharmacist [facility]  Population Health [phone]

## 2024-09-05 NOTE — Progress Notes (Signed)
 Lab results have been discussed.   Verbalized understanding? Yes  Are there any questions? No

## 2024-09-07 ENCOUNTER — Other Ambulatory Visit: Payer: Self-pay | Admitting: Family Medicine

## 2024-09-07 DIAGNOSIS — R0981 Nasal congestion: Secondary | ICD-10-CM

## 2024-09-12 ENCOUNTER — Telehealth: Payer: Self-pay

## 2024-09-12 NOTE — Telephone Encounter (Signed)
 Gave pt a call, pt is coming up due for reenrollment on [employer] Farxiga ,spoke with pt is aware she will received pap in the mail ,will faxed provider portion.

## 2024-09-20 ENCOUNTER — Telehealth: Payer: Self-pay | Admitting: Family Medicine

## 2024-09-20 NOTE — Telephone Encounter (Signed)
 Called patient to inform her that her Ozempic 1mg  has been delivered and is in the fridge. LVM for her to come pick up at her convenience

## 2024-09-24 ENCOUNTER — Other Ambulatory Visit: Payer: Self-pay | Admitting: Pharmacist

## 2024-09-25 ENCOUNTER — Other Ambulatory Visit (HOSPITAL_COMMUNITY): Payer: Self-pay

## 2024-09-25 NOTE — Telephone Encounter (Signed)
 Received approval letter on [employer] Farxiga  thru 12/04/2025 approval letter was index left a HIPAA VM.

## 2024-10-10 ENCOUNTER — Other Ambulatory Visit: Payer: Self-pay | Admitting: Pharmacist

## 2024-10-10 ENCOUNTER — Telehealth: Payer: Self-pay | Admitting: Pharmacist

## 2024-10-10 NOTE — Telephone Encounter (Signed)
 Attempt was made to contact patient by phone today for follow up by Clinical Pharmacist regarding Ozempic and type 2 DM.  Unable to reach patient. LM on VM with my contact number [phone].

## 2024-11-08 LAB — OPHTHALMOLOGY REPORT-SCANNED

## 2024-11-15 ENCOUNTER — Other Ambulatory Visit: Payer: Self-pay | Admitting: Family Medicine

## 2024-11-27 ENCOUNTER — Ambulatory Visit: Admitting: Family Medicine

## 2024-12-03 ENCOUNTER — Telehealth: Payer: Self-pay | Admitting: Pharmacist

## 2024-12-03 NOTE — Progress Notes (Signed)
 Attempt was made to contact patient by phone today to discuss cost of Ozempic for 2026. She received Ozempic from [facility] [facility] patient assistance program in 2025 but this program will not be available to Medicare patient's in 2026.  Reviewed her [facility] Medicare cost - would have $400 deductible, then cost of Ozempic would be $47 / month  Patient will continue to receive Farxiga  from [place] [employer] [place] thru 12/04/2025  Unable to reach patient. LM on VM with my contact number [phone].

## 2024-12-20 ENCOUNTER — Encounter: Payer: Self-pay | Admitting: Family Medicine

## 2024-12-20 ENCOUNTER — Ambulatory Visit: Admitting: Family Medicine

## 2024-12-20 VITALS — BP 122/82 | HR 89 | Temp 98.0°F | Resp 12 | Ht 64.0 in | Wt 173.6 lb

## 2024-12-20 DIAGNOSIS — I10 Essential (primary) hypertension: Secondary | ICD-10-CM | POA: Diagnosis not present

## 2024-12-20 DIAGNOSIS — L03316 Cellulitis of umbilicus: Secondary | ICD-10-CM | POA: Diagnosis not present

## 2024-12-20 DIAGNOSIS — R809 Proteinuria, unspecified: Secondary | ICD-10-CM | POA: Diagnosis not present

## 2024-12-20 DIAGNOSIS — E210 Primary hyperparathyroidism: Secondary | ICD-10-CM | POA: Insufficient documentation

## 2024-12-20 DIAGNOSIS — E1129 Type 2 diabetes mellitus with other diabetic kidney complication: Secondary | ICD-10-CM | POA: Diagnosis not present

## 2024-12-20 DIAGNOSIS — E782 Mixed hyperlipidemia: Secondary | ICD-10-CM | POA: Diagnosis not present

## 2024-12-20 DIAGNOSIS — E559 Vitamin D deficiency, unspecified: Secondary | ICD-10-CM | POA: Insufficient documentation

## 2024-12-20 LAB — COMPREHENSIVE METABOLIC PANEL WITH GFR
ALT: 10 U/L (ref 3–35)
AST: 12 U/L (ref 5–37)
Albumin: 4 g/dL (ref 3.5–5.2)
Alkaline Phosphatase: 56 U/L (ref 39–117)
BUN: 15 mg/dL (ref 6–23)
CO2: 27 meq/L (ref 19–32)
Calcium: 10.1 mg/dL (ref 8.4–10.5)
Chloride: 105 meq/L (ref 96–112)
Creatinine, Ser: 1.04 mg/dL (ref 0.40–1.20)
GFR: 51.65 mL/min — ABNORMAL LOW
Glucose, Bld: 114 mg/dL — ABNORMAL HIGH (ref 70–99)
Potassium: 4.6 meq/L (ref 3.5–5.1)
Sodium: 140 meq/L (ref 135–145)
Total Bilirubin: 0.7 mg/dL (ref 0.2–1.2)
Total Protein: 7.1 g/dL (ref 6.0–8.3)

## 2024-12-20 LAB — CBC
HCT: 42.7 % (ref 36.0–46.0)
Hemoglobin: 13.9 g/dL (ref 12.0–15.0)
MCHC: 32.7 g/dL (ref 30.0–36.0)
MCV: 94.1 fl (ref 78.0–100.0)
Platelets: 245 K/uL (ref 150.0–400.0)
RBC: 4.54 Mil/uL (ref 3.87–5.11)
RDW: 14.8 % (ref 11.5–15.5)
WBC: 4.9 K/uL (ref 4.0–10.5)

## 2024-12-20 LAB — LIPID PANEL
Cholesterol: 106 mg/dL (ref 28–200)
HDL: 34.5 mg/dL — ABNORMAL LOW
LDL Cholesterol: 55 mg/dL (ref 10–99)
NonHDL: 71.37
Total CHOL/HDL Ratio: 3
Triglycerides: 80 mg/dL (ref 10.0–149.0)
VLDL: 16 mg/dL (ref 0.0–40.0)

## 2024-12-20 LAB — HEMOGLOBIN A1C: Hgb A1c MFr Bld: 6.6 % — ABNORMAL HIGH (ref 4.6–6.5)

## 2024-12-20 MED ORDER — METOPROLOL SUCCINATE ER 200 MG PO TB24: 200.0000 mg | ORAL_TABLET | Freq: Every day | ORAL | 1 refills | Status: AC

## 2024-12-20 MED ORDER — METFORMIN HCL 1000 MG PO TABS: 1000.0000 mg | ORAL_TABLET | Freq: Two times a day (BID) | ORAL | 1 refills | Status: AC

## 2024-12-20 MED ORDER — DOXYCYCLINE HYCLATE 100 MG PO TABS: 100.0000 mg | ORAL_TABLET | Freq: Two times a day (BID) | ORAL | 0 refills | Status: DC

## 2024-12-20 MED ORDER — CLONIDINE HCL 0.2 MG PO TABS: 0.2000 mg | ORAL_TABLET | Freq: Two times a day (BID) | ORAL | 1 refills | Status: DC

## 2024-12-20 MED ORDER — LOSARTAN POTASSIUM 100 MG PO TABS: 100.0000 mg | ORAL_TABLET | Freq: Every day | ORAL | 1 refills | Status: AC

## 2024-12-20 MED ORDER — AMLODIPINE BESYLATE 10 MG PO TABS: 10.0000 mg | ORAL_TABLET | Freq: Every day | ORAL | 1 refills | Status: AC

## 2024-12-20 MED ORDER — ATORVASTATIN CALCIUM 20 MG PO TABS: ORAL_TABLET | ORAL | 1 refills | Status: AC

## 2024-12-20 NOTE — Progress Notes (Signed)
 "  Subjective:  Patient ID: Susan Miles, female    DOB: 12-26-1945  Age: 79 y.o. MRN: [id number]  CC:  Chief Complaint  Patient presents with   Follow-up    3 month medication check. No questions or concerns. Checks BG at home. Low around 78 and highest was 164  Patient has had some puss come from her belly button. Sore and bloody. Mostly white now. Sx started 2 weeks ago.     HPI Susan Miles presents for   Diabetes: Complicated by hyperglycemia, microalbuminuria, CKD, treated with Ozempic 1mg , Farxiga  10mg  and metformin  1000mg  BID.  Denies new abdominal pain, nausea and vomiting or mycotic/UTI symptoms. She is on statin with Lipitor, ARB with losartan . Medication assistance program for meds, appreciate assistance from clinical pharmacist. Home readings: 78-164 No symptomatic lows Microalbumin: Improved ratio in June from 63-27 Optho, foot exam, pneumovax: Up-to-date.  Lab Results  Component Value Date   HGBA1C 6.8 (H) 08/26/2024   HGBA1C 7.1 (H) 05/22/2024   HGBA1C 7.2 (H) 02/15/2024   Lab Results  Component Value Date   MICROALBUR 3.1 (H) 05/22/2024   LDLCALC 62 05/22/2024   CREATININE 0.92 08/26/2024   Hypertension: Treated with amlodipine , losartan , Toprol , clonidine .  Chronic peripheral edema versus lymphedema managed by sodium avoidance.  No new medication side effects.  Stable control today. Home readings: 120-130/80 BP Readings from Last 3 Encounters:  12/20/24 122/82  08/26/24 126/76  07/15/24 134/72   Lab Results  Component Value Date   CREATININE 0.92 08/26/2024   Hyperlipidemia: Lipitor 20 mg daily without any myalgias/side effects. Lab Results  Component Value Date   CHOL 114 05/22/2024   HDL 35.40 (L) 05/22/2024   LDLCALC 62 05/22/2024   TRIG 86.0 05/22/2024   CHOLHDL 3 05/22/2024   Lab Results  Component Value Date   ALT 11 08/26/2024   AST 14 08/26/2024   ALKPHOS 44 08/26/2024   BILITOT 0.6 08/26/2024   Umbilical  cellulitis/abscess Similar symptoms in the past treated with antibiotics.  Last treated in 2023.  Feels similar. Has noted recurrence over the past few weeks with some pus, small amount of blood at the area of the umbilicus.  Mostly white discharge at this time.  Denies fever, abdominal pain or spreading redness. Resolves in past with doxycycline .     At end of visit - she noted some knee issues, affecting balance at times. No falls, injury. No melena/hematochezia, no HA, no focal weakness. Plans follow up to discuss further with ER/RTC precautions given.     History Patient Active Problem List   Diagnosis Date Noted   Primary hyperparathyroidism 12/20/2024   Vitamin D deficiency 12/20/2024   Cellulitis and abscess of trunk 07/29/2022   Family history of breast cancer    Ductal carcinoma in situ (DCIS) of right breast 07/04/2019   Hyperlipidemia 12/21/2017   Breast asymmetry 05/01/2015   Cataract, nuclear 05/24/2012   Benign hypertension 02/10/2012   Type 2 diabetes mellitus with microalbuminuria, without long-term current use of insulin (HCC) 02/10/2012   Past Medical History:  Diagnosis Date   Breast cancer (HCC)    Cancer (HCC)    right breast cancer   Diabetes mellitus without complication (HCC)    Family history of breast cancer    Hypertension    Personal history of radiation therapy    Sleep apnea    does not use CPAP   Past Surgical History:  Procedure Laterality Date   ABDOMINAL HYSTERECTOMY  BREAST LUMPECTOMY     BREAST LUMPECTOMY WITH RADIOACTIVE SEED LOCALIZATION Right 08/09/2019   Procedure: RIGHT BREAST LUMPECTOMY X 2 WITH RADIOACTIVE SEED LOCALIZATION (3 SEEDS);  Surgeon: Ethyl Lenis, MD;  Location: [facility];  Service: General;  Laterality: Right;   Allergies[1] Prior to Admission medications  Medication Sig Start Date End Date Taking? Authorizing Provider  ACCU-CHEK AVIVA PLUS test strip USE AS INSTRUCTED 11/15/24  Yes Levora Susan SAUNDERS,  MD  acetaminophen  (TYLENOL ) 500 MG tablet Take 500 mg by mouth every 6 (six) hours as needed for mild pain.   Yes [provider]  amLODipine  (NORVASC ) 10 MG tablet Take 1 tablet (10 mg total) by mouth daily. 08/26/24  Yes Levora Susan SAUNDERS, MD  aspirin 81 MG tablet Take 81 mg by mouth daily.   Yes [provider]  atorvastatin  (LIPITOR) 20 MG tablet TAKE 1 TABLET BY MOUTH DAILY AT 6PM 08/26/24  Yes Levora Susan SAUNDERS, MD  Blood Glucose Monitoring Suppl (LDR BLOOD GLUCOSE TRUETEST) w/Device KIT 1 Device by Does not apply route daily. Test blood sugar once daily. Dx: E11.9 05/13/20  Yes Levora Susan SAUNDERS, MD  cloNIDine  (CATAPRES ) 0.2 MG tablet Take 1 tablet (0.2 mg total) by mouth 2 (two) times daily. 08/26/24  Yes Levora Susan SAUNDERS, MD  dapagliflozin  propanediol (FARXIGA ) 10 MG TABS tablet Take 1 tablet (10 mg total) by mouth daily before breakfast. 04/02/24  Yes Levora Susan SAUNDERS, MD  fluticasone  (FLONASE ) 50 MCG/ACT nasal spray Place 1-2 sprays into both nostrils daily. 05/22/24  Yes Levora Susan SAUNDERS, MD  ipratropium (ATROVENT ) 0.06 % nasal spray PLACE 1-2 SPRAYS INTO BOTH NOSTRILS 4 (FOUR) TIMES DAILY. AS NEEDED FOR NASAL CONGESTION 09/09/24  Yes Levora Susan SAUNDERS, MD  Lancets MISC Test blood sugar once daily. Dx: E11.9 01/24/18  Yes Levora Susan SAUNDERS, MD  losartan  (COZAAR ) 100 MG tablet Take 1 tablet (100 mg total) by mouth daily. 08/26/24  Yes Levora Susan SAUNDERS, MD  metFORMIN  (GLUCOPHAGE ) 1000 MG tablet Take 1 tablet (1,000 mg total) by mouth 2 (two) times daily with a meal. 08/26/24  Yes Levora Susan SAUNDERS, MD  metoprolol  (TOPROL -XL) 200 MG 24 hr tablet Take 1 tablet (200 mg total) by mouth daily. 08/26/24  Yes Levora Susan SAUNDERS, MD  Semaglutide, 1 MG/DOSE, 4 MG/3ML SOPN Inject 1 mg as directed once a week. 04/24/24  Yes Levora Susan SAUNDERS, MD  tamoxifen  (NOLVADEX ) 20 MG tablet TAKE 1 TABLET BY MOUTH EVERY DAY Patient not taking: Reported on 12/20/2024 07/04/24   Odean Potts, MD   Social  History   Socioeconomic History   Marital status: Married    Spouse name: Not on file   Number of children: Not on file   Years of education: Not on file   Highest education level: Not on file  Occupational History   Not on file  Tobacco Use   Smoking status: Never   Smokeless tobacco: Never  Substance and Sexual Activity   Alcohol use: No   Drug use: No   Sexual activity: Not on file  Other Topics Concern   Not on file  Social History Narrative   f   Social Drivers of Health   Tobacco Use: Low Risk (12/20/2024)   Patient History    Smoking Tobacco Use: Never    Smokeless Tobacco Use: Never    Passive Exposure: Not on file  Financial Resource Strain: Low Risk (07/31/2024)   Overall Financial Resource Strain (CARDIA)    Difficulty of  Paying Living Expenses: Not hard at all  Food Insecurity: No Food Insecurity (07/31/2024)   Epic    Worried About Programme Researcher, Broadcasting/film/video in the Last Year: Never true    Ran Out of Food in the Last Year: Never true  Transportation Needs: No Transportation Needs (07/31/2024)   Epic    Lack of Transportation (Medical): No    Lack of Transportation (Non-Medical): No  Physical Activity: Insufficiently Active (07/31/2024)   Exercise Vital Sign    Days of Exercise per Week: 3 days    Minutes of Exercise per Session: 20 min  Stress: No Stress Concern Present (07/31/2024)   [employer] of Occupational Health - Occupational Stress Questionnaire    Feeling of Stress: Not at all  Social Connections: Socially Integrated (07/31/2024)   Social Connection and Isolation Panel    Frequency of Communication with Friends and Family: More than three times a week    Frequency of Social Gatherings with Friends and Family: Twice a week    Attends Religious Services: More than 4 times per year    Active Member of Clubs or Organizations: Yes    Attends Banker Meetings: More than 4 times per year    Marital Status: Married  Catering Manager Violence:  Not At Risk (07/31/2024)   Epic    Fear of Current or Ex-Partner: No    Emotionally Abused: No    Physically Abused: No    Sexually Abused: No  Depression (PHQ2-9): Low Risk (12/20/2024)   Depression (PHQ2-9)    PHQ-2 Score: 3  Alcohol Screen: Low Risk (07/31/2024)   Alcohol Screen    Last Alcohol Screening Score (AUDIT): 0  Housing: Unknown (07/31/2024)   Epic    Unable to Pay for Housing in the Last Year: No    Number of Times Moved in the Last Year: Not on file    Homeless in the Last Year: No  Utilities: Not At Risk (07/31/2024)   Epic    Threatened with loss of utilities: No  Health Literacy: Adequate Health Literacy (07/31/2024)   B1300 Health Literacy    Frequency of need for help with medical instructions: Never    Review of Systems   Objective:   Vitals:   12/20/24 0922  BP: 122/82  Pulse: 89  Resp: 12  Temp: 98 F (36.7 C)  TempSrc: Temporal  SpO2: 99%  Weight: 173 lb 9.6 oz (78.7 kg)  Height: 5' 4 (1.626 m)     Physical Exam Vitals reviewed.  Constitutional:      Appearance: Normal appearance. She is well-developed.  HENT:     Head: Normocephalic and atraumatic.  Eyes:     Conjunctiva/sclera: Conjunctivae normal.     Pupils: Pupils are equal, round, and reactive to light.  Neck:     Vascular: No carotid bruit.  Cardiovascular:     Rate and Rhythm: Normal rate and regular rhythm.     Heart sounds: Normal heart sounds.  Pulmonary:     Effort: Pulmonary effort is normal.     Breath sounds: Normal breath sounds.  Abdominal:     General: There is no distension.     Palpations: Abdomen is soft. There is no pulsatile mass.     Tenderness: There is no abdominal tenderness.     Comments: Small liquid noted at the umbilicus with pressure but I did not see any opening on visualized aspect of umbilicus.  No surrounding induration.  No appreciable surrounding erythema.  Musculoskeletal:  Right lower leg: No edema.     Left lower leg: No edema.  Skin:     General: Skin is warm and dry.  Neurological:     Mental Status: She is alert and oriented to person, place, and time.  Psychiatric:        Mood and Affect: Mood normal.        Behavior: Behavior normal.      Assessment & Plan:  Susan Miles is a 79 y.o. female . Essential hypertension - Plan: CBC, Comprehensive metabolic panel with GFR  - Stable current regimen, continue same, check labs and adjust plan accordingly.  She does plan on follow-up to discuss knee issues which may be affecting balance.  RTC/ER precautions given if worsening symptoms in the interim.  Type 2 diabetes mellitus with microalbuminuria, without long-term current use of insulin (HCC) - Plan: Hemoglobin A1c  - Tolerating current med regimen, check A1c and adjust plan accordingly.  Mixed hyperlipidemia - Plan: Comprehensive metabolic panel with GFR, Lipid panel  - Tolerating current dose statin, check labs and adjust plan accordingly.  Cellulitis of umbilicus - Plan: doxycycline  (VIBRA -TABS) 100 MG tablet  - Appears to be improving, will cover with doxycycline  with RTC precautions, recheck planned in the next 2 weeks.   Meds ordered this encounter  Medications   doxycycline  (VIBRA -TABS) 100 MG tablet    Sig: Take 1 tablet (100 mg total) by mouth 2 (two) times daily.    Dispense:  14 tablet    Refill:  0   Patient Instructions  Sounds like the possible infection around the bellybutton is improving but I do think it would be reasonable to treat with antibiotics for 1 week.  If you notice any worsening on antibiotics or return of symptoms after completion of antibiotics, please be seen.  No change in chronic medications at this time.  I will let you know if there are any concerns on labs.  Will follow-up in the next week or 2 to recheck to the area around your abdomen and discuss knee, balance issues further.  However if any acute change in the symptoms or worsening, be seen here or through emergency room if  needed.    Signed,   Susan Pines, MD  Primary Care, [facility] 12/20/24 9:59 AM      [1]  Allergies Allergen Reactions   Ace Inhibitors Cough   "

## 2024-12-20 NOTE — Patient Instructions (Signed)
 Sounds like the possible infection around the bellybutton is improving but I do think it would be reasonable to treat with antibiotics for 1 week.  If you notice any worsening on antibiotics or return of symptoms after completion of antibiotics, please be seen.  No change in chronic medications at this time.  I will let you know if there are any concerns on labs.  Will follow-up in the next week or 2 to recheck to the area around your abdomen and discuss knee, balance issues further.  However if any acute change in the symptoms or worsening, be seen here or through emergency room if needed.

## 2024-12-27 ENCOUNTER — Ambulatory Visit: Payer: Self-pay | Admitting: Family Medicine

## 2025-01-02 NOTE — Progress Notes (Signed)
 Lab results have been discussed.   Verbalized understanding? Yes  Are there any questions? No

## 2025-01-03 ENCOUNTER — Ambulatory Visit: Admitting: Family Medicine

## 2025-01-03 ENCOUNTER — Encounter: Payer: Self-pay | Admitting: Family Medicine

## 2025-01-03 VITALS — BP 112/60 | HR 86 | Temp 97.9°F | Resp 16 | Ht 64.0 in | Wt 174.8 lb

## 2025-01-03 DIAGNOSIS — L03316 Cellulitis of umbilicus: Secondary | ICD-10-CM

## 2025-01-03 DIAGNOSIS — R5383 Other fatigue: Secondary | ICD-10-CM

## 2025-01-03 DIAGNOSIS — I10 Essential (primary) hypertension: Secondary | ICD-10-CM | POA: Diagnosis not present

## 2025-01-03 DIAGNOSIS — M2352 Chronic instability of knee, left knee: Secondary | ICD-10-CM | POA: Diagnosis not present

## 2025-01-03 MED ORDER — DOXYCYCLINE HYCLATE 100 MG PO TABS: 100.0000 mg | ORAL_TABLET | Freq: Two times a day (BID) | ORAL | 0 refills | Status: AC

## 2025-01-03 MED ORDER — CLONIDINE HCL 0.1 MG PO TABS: 0.1000 mg | ORAL_TABLET | Freq: Two times a day (BID) | ORAL | 1 refills | Status: AC

## 2025-01-03 NOTE — Addendum Note (Signed)
 Addended by: Eriona Kinchen R on: 01/03/2025 11:56 AM   Modules accepted: Orders

## 2025-01-03 NOTE — Patient Instructions (Addendum)
 Blood counts looked okay.  The fatigue with your medications could be related in part to clonidine .  Based on your most recent blood pressures I think we can try tapering slightly down on that medication.  For the next 1 week taking the new clonidine  dose 0.1 mg in the morning, continue your 0.2 mg in the evening.  After 1 week you can switch over to the 0.1 mg dose twice per day in place of your 0.2 mg dose and then stay on that dose as long as blood pressures are stable.  We can try 1 additional week of the doxycycline  as that was helping somewhat with the drainage from the umbilicus (belly button),  but if that does not completely improve with this course of antibiotics, let me know and I would like you to meet with a surgeon to evaluate that area further.  Be seen if any new or worsening symptoms including any pain, increased swelling, redness, fevers or other new concerns.   I am encouraged by your knee exam today, without any pain, swelling or redness.  I think it would be worthwhile trying some physical therapy to help strengthen the muscles around the knee and see if that will help with the instability feeling.  It may be helpful to check an initial x-ray to make sure I do not see a significant amount of arthritis.  We also have a option to meet with orthopedics but can try physical therapy first.  As long as that is improving, can follow-up with me in 3 months to review your medications.  Let me know if there are any questions in the meantime.  Address for x-ray as listed below.  You do not need an appointment.  [facility] or xray: Walk in 8:30-4:30 during weekdays, no appointment needed [address]  [place], [place] [id number]

## 2025-01-03 NOTE — Progress Notes (Signed)
 "  Subjective:  Patient ID: Susan Miles, female    DOB: 1946/08/20  Age: 79 y.o. MRN: [id number]  CC:  Chief Complaint  Patient presents with   Follow-up    Review labs, recheck umbilicus, and discuss left knee, balance concerns. Finished abx did not clear up drainage.     HPI Susan Miles presents for  Follow-up concerns from January 16 visit.  Umbilical cellulitis/abscess Similar episodes in the past.  Had noted a few weeks prior to her last visit with some pus, small amount of blood in the area of the umbilicus, white discharge at times but no fever or abdominal pain or spreading redness.  Had tolerated doxycycline  in the past, we ordered for a 7-day course. She has completed antibiotics, still notices some slight yellow drainage. Improved, just not resolved. Last dose last weekend. No se's with doxycycline .   Balance issues/new concerns Briefly discussed at the end of her visit in January.  Denies injury with 1 fall, just feels unsteady with some left knee discomfort, instability. No known injury. 3 episodes where left knee will give away. Did have fall at church when knee buckled. Noticed a few more times. No pain, just feels unstable.  No swelling. No prior knee injection/surgery. Denies dizziness, lightheadedness, HA or other balance issues - just left knee issue. 5 mins of exercise on elliptical.  Tx: none  She does note fatigue in the morning after meds. takes clonidine , BP has been controlled recently.   Lab Results  Component Value Date   WBC 4.9 12/20/2024   HGB 13.9 12/20/2024   HCT 42.7 12/20/2024   MCV 94.1 12/20/2024   PLT 245.0 12/20/2024   BP Readings from Last 3 Encounters:  01/03/25 112/60  12/20/24 122/82  08/26/24 126/76   Lab Results  Component Value Date   CREATININE 1.04 12/20/2024     History Patient Active Problem List   Diagnosis Date Noted   Primary hyperparathyroidism 12/20/2024   Vitamin D deficiency 12/20/2024   Cellulitis  and abscess of trunk 07/29/2022   Family history of breast cancer    Ductal carcinoma in situ (DCIS) of right breast 07/04/2019   Hyperlipidemia 12/21/2017   Breast asymmetry 05/01/2015   Cataract, nuclear 05/24/2012   Benign hypertension 02/10/2012   Type 2 diabetes mellitus with microalbuminuria, without long-term current use of insulin (HCC) 02/10/2012   Past Medical History:  Diagnosis Date   Breast cancer (HCC)    Cancer (HCC)    right breast cancer   Diabetes mellitus without complication (HCC)    Family history of breast cancer    Hypertension    Personal history of radiation therapy    Sleep apnea    does not use CPAP   Past Surgical History:  Procedure Laterality Date   ABDOMINAL HYSTERECTOMY     BREAST LUMPECTOMY     BREAST LUMPECTOMY WITH RADIOACTIVE SEED LOCALIZATION Right 08/09/2019   Procedure: RIGHT BREAST LUMPECTOMY X 2 WITH RADIOACTIVE SEED LOCALIZATION (3 SEEDS);  Surgeon: Ethyl Lenis, MD;  Location: [facility];  Service: General;  Laterality: Right;   Allergies[1] Prior to Admission medications  Medication Sig Start Date End Date Taking? Authorizing Provider  ACCU-CHEK AVIVA PLUS test strip USE AS INSTRUCTED 11/15/24  Yes Levora Reyes SAUNDERS, MD  acetaminophen  (TYLENOL ) 500 MG tablet Take 500 mg by mouth every 6 (six) hours as needed for mild pain.   Yes [provider]  amLODipine  (NORVASC ) 10 MG tablet Take 1 tablet (10  mg total) by mouth daily. 12/20/24  Yes Levora Reyes SAUNDERS, MD  aspirin 81 MG tablet Take 81 mg by mouth daily.   Yes [provider]  atorvastatin  (LIPITOR) 20 MG tablet TAKE 1 TABLET BY MOUTH DAILY AT 6PM 12/20/24  Yes Levora Reyes SAUNDERS, MD  Blood Glucose Monitoring Suppl (LDR BLOOD GLUCOSE TRUETEST) w/Device KIT 1 Device by Does not apply route daily. Test blood sugar once daily. Dx: E11.9 05/13/20  Yes Levora Reyes SAUNDERS, MD  cloNIDine  (CATAPRES ) 0.2 MG tablet Take 1 tablet (0.2 mg total) by mouth 2 (two) times  daily. 12/20/24  Yes Levora Reyes SAUNDERS, MD  dapagliflozin  propanediol (FARXIGA ) 10 MG TABS tablet Take 1 tablet (10 mg total) by mouth daily before breakfast. 04/02/24  Yes Levora Reyes SAUNDERS, MD  fluticasone  (FLONASE ) 50 MCG/ACT nasal spray Place 1-2 sprays into both nostrils daily. 05/22/24  Yes Levora Reyes SAUNDERS, MD  ipratropium (ATROVENT ) 0.06 % nasal spray PLACE 1-2 SPRAYS INTO BOTH NOSTRILS 4 (FOUR) TIMES DAILY. AS NEEDED FOR NASAL CONGESTION 09/09/24  Yes Levora Reyes SAUNDERS, MD  Lancets MISC Test blood sugar once daily. Dx: E11.9 01/24/18  Yes Levora Reyes SAUNDERS, MD  losartan  (COZAAR ) 100 MG tablet Take 1 tablet (100 mg total) by mouth daily. 12/20/24  Yes Levora Reyes SAUNDERS, MD  metFORMIN  (GLUCOPHAGE ) 1000 MG tablet Take 1 tablet (1,000 mg total) by mouth 2 (two) times daily with a meal. 12/20/24  Yes Levora Reyes SAUNDERS, MD  metoprolol  (TOPROL -XL) 200 MG 24 hr tablet Take 1 tablet (200 mg total) by mouth daily. 12/20/24  Yes Levora Reyes SAUNDERS, MD  Semaglutide, 1 MG/DOSE, 4 MG/3ML SOPN Inject 1 mg as directed once a week. 04/24/24  Yes Levora Reyes SAUNDERS, MD  doxycycline  (VIBRA -TABS) 100 MG tablet Take 1 tablet (100 mg total) by mouth 2 (two) times daily. Patient not taking: Reported on 01/03/2025 12/20/24   Levora Reyes SAUNDERS, MD   Social History   Socioeconomic History   Marital status: Married    Spouse name: Not on file   Number of children: Not on file   Years of education: Not on file   Highest education level: Not on file  Occupational History   Not on file  Tobacco Use   Smoking status: Never   Smokeless tobacco: Never  Substance and Sexual Activity   Alcohol use: No   Drug use: No   Sexual activity: Not on file  Other Topics Concern   Not on file  Social History Narrative   f   Social Drivers of Health   Tobacco Use: Low Risk (01/03/2025)   Patient History    Smoking Tobacco Use: Never    Smokeless Tobacco Use: Never    Passive Exposure: Not on file  Financial Resource Strain:  Low Risk (07/31/2024)   Overall Financial Resource Strain (CARDIA)    Difficulty of Paying Living Expenses: Not hard at all  Food Insecurity: No Food Insecurity (07/31/2024)   Epic    Worried About Radiation Protection Practitioner of Food in the Last Year: Never true    Ran Out of Food in the Last Year: Never true  Transportation Needs: No Transportation Needs (07/31/2024)   Epic    Lack of Transportation (Medical): No    Lack of Transportation (Non-Medical): No  Physical Activity: Insufficiently Active (07/31/2024)   Exercise Vital Sign    Days of Exercise per Week: 3 days    Minutes of Exercise per Session: 20 min  Stress: No Stress Concern Present (  07/31/2024)   [employer] of Occupational Health - Occupational Stress Questionnaire    Feeling of Stress: Not at all  Social Connections: Socially Integrated (07/31/2024)   Social Connection and Isolation Panel    Frequency of Communication with Friends and Family: More than three times a week    Frequency of Social Gatherings with Friends and Family: Twice a week    Attends Religious Services: More than 4 times per year    Active Member of Clubs or Organizations: Yes    Attends Banker Meetings: More than 4 times per year    Marital Status: Married  Catering Manager Violence: Not At Risk (07/31/2024)   Epic    Fear of Current or Ex-Partner: No    Emotionally Abused: No    Physically Abused: No    Sexually Abused: No  Depression (PHQ2-9): Low Risk (12/20/2024)   Depression (PHQ2-9)    PHQ-2 Score: 3  Alcohol Screen: Low Risk (07/31/2024)   Alcohol Screen    Last Alcohol Screening Score (AUDIT): 0  Housing: Unknown (07/31/2024)   Epic    Unable to Pay for Housing in the Last Year: No    Number of Times Moved in the Last Year: Not on file    Homeless in the Last Year: No  Utilities: Not At Risk (07/31/2024)   Epic    Threatened with loss of utilities: No  Health Literacy: Adequate Health Literacy (07/31/2024)   B1300 Health Literacy     Frequency of need for help with medical instructions: Never    Review of Systems   Objective:   Vitals:   01/03/25 1052  BP: 112/60  Pulse: 86  Resp: 16  Temp: 97.9 F (36.6 C)  TempSrc: Temporal  SpO2: 97%  Weight: 174 lb 12.8 oz (79.3 kg)  Height: 5' 4 (1.626 m)     Physical Exam Vitals reviewed.  Constitutional:      Appearance: Normal appearance. She is well-developed.  HENT:     Head: Normocephalic and atraumatic.  Eyes:     Conjunctiva/sclera: Conjunctivae normal.     Pupils: Pupils are equal, round, and reactive to light.  Neck:     Vascular: No carotid bruit.  Cardiovascular:     Rate and Rhythm: Normal rate and regular rhythm.     Heart sounds: Normal heart sounds.  Pulmonary:     Effort: Pulmonary effort is normal.     Breath sounds: Normal breath sounds.  Abdominal:     Palpations: Abdomen is soft. There is no pulsatile mass.     Tenderness: There is no abdominal tenderness.     Comments: Small amount of clear discharge noted at the umbilicus, no appreciable bleeding or thick exudate/discharge.  No surrounding erythema, no induration, nontender.  Musculoskeletal:     Right lower leg: No edema.     Left lower leg: No edema.     Comments: Left knee, skin intact, no effusion or erythema, full range of motion, no bony tenderness.  Negative drawer,  McMurray, varus and valgus testing.  Minimal VMO bulk.  Ambulating without assistive device.  Skin:    General: Skin is warm and dry.  Neurological:     Mental Status: She is alert and oriented to person, place, and time.  Psychiatric:        Mood and Affect: Mood normal.        Behavior: Behavior normal.        Assessment & Plan:  Susan Miles is  a 79 y.o. female . Cellulitis of umbilicus - Plan: doxycycline  (VIBRA -TABS) 100 MG tablet, WOUND CULTURE  - Improved with small amount of residual clear discharge.  Will check culture, and additional 1 week of doxycycline , then if not improving consider  general surgery eval.  No appreciable sign of abscess or worsening at this time with RTC precautions given.  Essential hypertension - Plan: cloNIDine  (CATAPRES ) 0.1 MG tablet Fatigue, unspecified type - Plan: cloNIDine  (CATAPRES ) 0.1 MG tablet  - Blood pressure has been well-controlled, some of the fatigue may be due to clonidine .  Will slowly taper down with 1 week of 0.1 mg in the morning, continue 0.2 mg at night, then transition to 0.1 mg twice daily as long as blood pressures are stable.  RTC precautions, regular home monitoring of BP discussed.  Recurrent left knee instability - Plan: DG Knee Complete 4 Views Left, Ambulatory referral to Physical Therapy  - Reassuring exam without any pain, no known injury.  No swelling, no erythema.  Question underlying degenerative meniscal disease or degenerative joint disease.  Check imaging.  Trial of physical therapy for surrounding musculature strengthening and stability, then consider Ortho eval if not improving.  RTC precautions.  Meds ordered this encounter  Medications   cloNIDine  (CATAPRES ) 0.1 MG tablet    Sig: Take 1 tablet (0.1 mg total) by mouth 2 (two) times daily.    Dispense:  180 tablet    Refill:  1   doxycycline  (VIBRA -TABS) 100 MG tablet    Sig: Take 1 tablet (100 mg total) by mouth 2 (two) times daily.    Dispense:  14 tablet    Refill:  0   Patient Instructions  Blood counts looked okay.  The fatigue with your medications could be related in part to clonidine .  Based on your most recent blood pressures I think we can try tapering slightly down on that medication.  For the next 1 week taking the new clonidine  dose 0.1 mg in the morning, continue your 0.2 mg in the evening.  After 1 week you can switch over to the 0.1 mg dose twice per day in place of your 0.2 mg dose and then stay on that dose as long as blood pressures are stable.  We can try 1 additional week of the doxycycline  as that was helping somewhat with the drainage from  the umbilicus (belly button),  but if that does not completely improve with this course of antibiotics, let me know and I would like you to meet with a surgeon to evaluate that area further.  Be seen if any new or worsening symptoms including any pain, increased swelling, redness, fevers or other new concerns.   I am encouraged by your knee exam today, without any pain, swelling or redness.  I think it would be worthwhile trying some physical therapy to help strengthen the muscles around the knee and see if that will help with the instability feeling.  It may be helpful to check an initial x-ray to make sure I do not see a significant amount of arthritis.  We also have a option to meet with orthopedics but can try physical therapy first.  As long as that is improving, can follow-up with me in 3 months to review your medications.  Let me know if there are any questions in the meantime.  Address for x-ray as listed below.  You do not need an appointment.   [facility] or xray: Walk in 8:30-4:30 during weekdays, no appointment  needed [address]  [place], [place] [address]          Signed,   Reyes Pines, MD [facility], [facility] 01/03/25 11:45 AM      [1]  Allergies Allergen Reactions   Ace Inhibitors Cough   "

## 2025-01-07 LAB — ANAEROBIC AND AEROBIC CULTURE
MICRO NUMBER:: 17532448
MICRO NUMBER:: 17532448
SPECIMEN QUALITY:: ADEQUATE

## 2025-01-08 ENCOUNTER — Ambulatory Visit
Admission: RE | Admit: 2025-01-08 | Discharge: 2025-01-08 | Disposition: A | Source: Ambulatory Visit | Attending: Family Medicine | Admitting: Family Medicine

## 2025-01-08 DIAGNOSIS — M2352 Chronic instability of knee, left knee: Secondary | ICD-10-CM | POA: Diagnosis not present

## 2025-01-09 ENCOUNTER — Ambulatory Visit: Payer: Self-pay | Admitting: Family Medicine

## 2025-01-16 ENCOUNTER — Ambulatory Visit: Admitting: Physical Therapy

## 2025-07-15 ENCOUNTER — Encounter: Admitting: Adult Health

## 2025-08-05 ENCOUNTER — Encounter
